# Patient Record
Sex: Female | Born: 1935 | Race: White | Hispanic: No | Marital: Married | Smoking: Never smoker
Health system: Southern US, Community
[De-identification: ages and names within clinical notes are randomized; demographics above are authoritative.]

## PROBLEM LIST (undated history)

## (undated) DIAGNOSIS — S72009A Fracture of unspecified part of neck of unspecified femur, initial encounter for closed fracture: Secondary | ICD-10-CM

## (undated) DIAGNOSIS — D649 Anemia, unspecified: Secondary | ICD-10-CM

## (undated) DIAGNOSIS — F419 Anxiety disorder, unspecified: Secondary | ICD-10-CM

## (undated) DIAGNOSIS — J449 Chronic obstructive pulmonary disease, unspecified: Secondary | ICD-10-CM

## (undated) DIAGNOSIS — K219 Gastro-esophageal reflux disease without esophagitis: Secondary | ICD-10-CM

## (undated) DIAGNOSIS — E079 Disorder of thyroid, unspecified: Secondary | ICD-10-CM

## (undated) DIAGNOSIS — I219 Acute myocardial infarction, unspecified: Secondary | ICD-10-CM

## (undated) DIAGNOSIS — I2510 Atherosclerotic heart disease of native coronary artery without angina pectoris: Secondary | ICD-10-CM

## (undated) DIAGNOSIS — N390 Urinary tract infection, site not specified: Secondary | ICD-10-CM

## (undated) DIAGNOSIS — R0989 Other specified symptoms and signs involving the circulatory and respiratory systems: Secondary | ICD-10-CM

## (undated) DIAGNOSIS — N179 Acute kidney failure, unspecified: Secondary | ICD-10-CM

## (undated) DIAGNOSIS — M1990 Unspecified osteoarthritis, unspecified site: Secondary | ICD-10-CM

## (undated) DIAGNOSIS — F329 Major depressive disorder, single episode, unspecified: Secondary | ICD-10-CM

## (undated) DIAGNOSIS — E781 Pure hyperglyceridemia: Secondary | ICD-10-CM

## (undated) DIAGNOSIS — F0390 Unspecified dementia without behavioral disturbance: Secondary | ICD-10-CM

## (undated) DIAGNOSIS — E039 Hypothyroidism, unspecified: Secondary | ICD-10-CM

## (undated) DIAGNOSIS — I10 Essential (primary) hypertension: Secondary | ICD-10-CM

## (undated) DIAGNOSIS — F32A Depression, unspecified: Secondary | ICD-10-CM

## (undated) DIAGNOSIS — R0600 Dyspnea, unspecified: Secondary | ICD-10-CM

## (undated) DIAGNOSIS — I209 Angina pectoris, unspecified: Secondary | ICD-10-CM

## (undated) HISTORY — DX: Atherosclerotic heart disease of native coronary artery without angina pectoris: I25.10

## (undated) HISTORY — PX: EYE SURGERY: SHX253

## (undated) HISTORY — DX: Disorder of thyroid, unspecified: E07.9

## (undated) HISTORY — DX: Other specified symptoms and signs involving the circulatory and respiratory systems: R09.89

---

## 1970-08-25 HISTORY — PX: ABDOMINAL HYSTERECTOMY: SHX81

## 1999-08-26 HISTORY — PX: CARDIAC SURGERY: SHX584

## 2001-03-05 ENCOUNTER — Inpatient Hospital Stay (HOSPITAL_COMMUNITY): Admission: EM | Admit: 2001-03-05 | Discharge: 2001-03-14 | Payer: Self-pay | Admitting: Emergency Medicine

## 2001-03-05 ENCOUNTER — Encounter: Admission: RE | Admit: 2001-03-05 | Discharge: 2001-03-07 | Payer: Self-pay | Admitting: *Deleted

## 2001-03-05 ENCOUNTER — Encounter: Payer: Self-pay | Admitting: Emergency Medicine

## 2001-03-13 ENCOUNTER — Encounter: Payer: Self-pay | Admitting: *Deleted

## 2001-03-19 ENCOUNTER — Emergency Department (HOSPITAL_COMMUNITY): Admission: EM | Admit: 2001-03-19 | Discharge: 2001-03-19 | Payer: Self-pay | Admitting: Emergency Medicine

## 2001-03-19 ENCOUNTER — Ambulatory Visit (HOSPITAL_COMMUNITY): Admission: RE | Admit: 2001-03-19 | Discharge: 2001-03-19 | Payer: Self-pay | Admitting: Emergency Medicine

## 2001-09-08 ENCOUNTER — Ambulatory Visit (HOSPITAL_COMMUNITY): Admission: RE | Admit: 2001-09-08 | Discharge: 2001-09-10 | Payer: Self-pay | Admitting: *Deleted

## 2002-03-18 ENCOUNTER — Ambulatory Visit (HOSPITAL_COMMUNITY): Admission: RE | Admit: 2002-03-18 | Discharge: 2002-03-19 | Payer: Self-pay | Admitting: Cardiovascular Disease

## 2002-03-18 ENCOUNTER — Encounter: Payer: Self-pay | Admitting: Cardiovascular Disease

## 2007-08-06 ENCOUNTER — Inpatient Hospital Stay (HOSPITAL_COMMUNITY): Admission: EM | Admit: 2007-08-06 | Discharge: 2007-08-14 | Payer: Self-pay | Admitting: Emergency Medicine

## 2007-08-09 ENCOUNTER — Encounter (INDEPENDENT_AMBULATORY_CARE_PROVIDER_SITE_OTHER): Payer: Self-pay | Admitting: Surgery

## 2007-08-09 ENCOUNTER — Encounter: Payer: Self-pay | Admitting: General Surgery

## 2007-08-26 HISTORY — PX: GALLBLADDER SURGERY: SHX652

## 2007-08-26 HISTORY — PX: HERNIA REPAIR: SHX51

## 2007-12-05 ENCOUNTER — Emergency Department (HOSPITAL_COMMUNITY): Admission: EM | Admit: 2007-12-05 | Discharge: 2007-12-05 | Payer: Self-pay | Admitting: Emergency Medicine

## 2010-03-07 ENCOUNTER — Observation Stay (HOSPITAL_COMMUNITY): Admission: EM | Admit: 2010-03-07 | Discharge: 2010-03-09 | Payer: Self-pay | Admitting: Emergency Medicine

## 2010-10-18 ENCOUNTER — Encounter (HOSPITAL_COMMUNITY): Payer: Self-pay | Admitting: Radiology

## 2010-10-18 ENCOUNTER — Emergency Department (HOSPITAL_COMMUNITY): Payer: Medicare Other

## 2010-10-18 ENCOUNTER — Observation Stay (HOSPITAL_COMMUNITY)
Admission: EM | Admit: 2010-10-18 | Discharge: 2010-10-19 | Disposition: A | Discharge status: Home / Self Care | Payer: Medicare Other | Attending: Internal Medicine | Admitting: Internal Medicine

## 2010-10-18 DIAGNOSIS — Z79899 Other long term (current) drug therapy: Secondary | ICD-10-CM | POA: Insufficient documentation

## 2010-10-18 DIAGNOSIS — M129 Arthropathy, unspecified: Secondary | ICD-10-CM | POA: Insufficient documentation

## 2010-10-18 DIAGNOSIS — I10 Essential (primary) hypertension: Secondary | ICD-10-CM | POA: Insufficient documentation

## 2010-10-18 DIAGNOSIS — K429 Umbilical hernia without obstruction or gangrene: Secondary | ICD-10-CM | POA: Insufficient documentation

## 2010-10-18 DIAGNOSIS — I252 Old myocardial infarction: Secondary | ICD-10-CM | POA: Insufficient documentation

## 2010-10-18 DIAGNOSIS — N390 Urinary tract infection, site not specified: Secondary | ICD-10-CM | POA: Insufficient documentation

## 2010-10-18 DIAGNOSIS — R1032 Left lower quadrant pain: Principal | ICD-10-CM | POA: Insufficient documentation

## 2010-10-18 DIAGNOSIS — I2510 Atherosclerotic heart disease of native coronary artery without angina pectoris: Secondary | ICD-10-CM | POA: Insufficient documentation

## 2010-10-18 HISTORY — DX: Essential (primary) hypertension: I10

## 2010-10-18 LAB — BASIC METABOLIC PANEL
CO2: 29 mEq/L (ref 19–32)
Calcium: 9.6 mg/dL (ref 8.4–10.5)
GFR calc Af Amer: 53 mL/min — ABNORMAL LOW (ref >60)
GFR calc non Af Amer: 44 mL/min — ABNORMAL LOW (ref >60)
Potassium: 4.2 mEq/L (ref 3.5–5.1)
Sodium: 137 mEq/L (ref 135–145)

## 2010-10-18 LAB — CBC
Hemoglobin: 11.7 g/dL — ABNORMAL LOW (ref 12.0–15.0)
MCHC: 33 g/dL (ref 30.0–36.0)
RDW: 13.2 % (ref 11.5–15.5)
WBC: 12.3 10*3/uL — ABNORMAL HIGH (ref 4.0–10.5)

## 2010-10-18 LAB — URINALYSIS, ROUTINE W REFLEX MICROSCOPIC
Nitrite: NEGATIVE
Specific Gravity, Urine: 1.015 (ref 1.005–1.030)
Urobilinogen, UA: 0.2 mg/dL (ref 0.0–1.0)
pH: 6 (ref 5.0–8.0)

## 2010-10-18 LAB — URINE MICROSCOPIC-ADD ON

## 2010-10-18 LAB — DIFFERENTIAL
Basophils Absolute: 0 10*3/uL (ref 0.0–0.1)
Basophils Relative: 0 % (ref 0–1)
Lymphocytes Relative: 19 % (ref 12–46)
Monocytes Absolute: 1.5 10*3/uL — ABNORMAL HIGH (ref 0.1–1.0)
Neutro Abs: 8 10*3/uL — ABNORMAL HIGH (ref 1.7–7.7)

## 2010-10-18 MED ORDER — IOHEXOL 300 MG/ML  SOLN
80.0000 mL | Freq: Once | INTRAMUSCULAR | Status: AC | PRN
  Administered 2010-10-18: 80 mL via INTRAVENOUS

## 2010-10-20 LAB — URINE CULTURE: Colony Count: >=100000

## 2010-10-22 NOTE — H&P (Signed)
[name] [name], [name] [name]                   ACCOUNT NO.:  [id number]  MEDICAL RECORD NO.:  [id number]           PATIENT TYPE:  E  LOCATION:  [facility]                         FACILITY:  [facility]  PHYSICIAN:  Calvert Cantor, M.D.     DATE OF BIRTH:  1918-09-30  DATE OF ADMISSION:  10/18/2010 DATE OF DISCHARGE:                             HISTORY & PHYSICAL   PRESENTING COMPLAINT:  Abdominal pain.  PRIMARY CARE PHYSICIAN:  Windle Guard, MD  CARDIOLOGIST:  Nicki Guadalajara, MD  SURGEON:  Velora Heckler, MD  HISTORY OF PRESENT ILLNESS:  This is an 77 year old female who comes in with a complaint of abdominal pain.  This was present around the middle of her abdomen where her hernia is, started about 4-5 days ago and has now settled down in the right lower quadrant.  She states the pain was quite severe at home.  She did not call her PCP - for a prescription and has been taking Tylenol which has been ineffective.  She is brought into the hospital with the pain and it is now controlled completely with 0.5 mg of IV Dilaudid.  The patient would like to be admitted.  Family states she is unable to take care of herself although her son and her daughter are both here, for some reason they cannot take care of her outside of the hospital.  A CT of her abdomen and pelvis is done and other than a large fat- containing hernia, no other abnormality has been found that could be causing her pain.  Surgery has been called and has recommended that she follow up as an outpatient.  The patient is also found to have an UTI.  She has been asymptomatic regarding this.  PAST MEDICAL HISTORY: 1. Hypertension. 2. Coronary artery disease status post stent. 3. Severe arthritis. 4. Chronic drainage from the umbilicus after a cholecystectomy and     hernia repair back in 2009.  SOCIAL HISTORY:  Does not smoke or drink or use drugs.  She lives by herself.  ALLERGIES:  CODEINE causes nausea and vomiting.  PENICILLIN causes  a rash.  MORPHINE is also not an allergy, just nausea and vomiting.  REVIEW OF SYSTEMS:  CONSTITUTIONAL:  No recent weight loss, weight gain. No frequent headaches.  No blurred vision, double vision, sore throat, sinus trouble or earache.  RESPIRATORY:  No cough or shortness of breath.  CARDIAC:  No chest pain, palpitations or pedal edema.  GI: Positive for abdominal pain, otherwise no nausea, vomiting, diarrhea or constipation.  GU:  No symptoms of dysuria or hematuria.  HEMATOLOGIC: No easy bruising.  SKIN:  No rash.  MUSCULOSKELETAL:  Has arthritis. NEUROLOGIC:  No focal numbness, weakness, tingling.  No history of stroke or seizure.  PSYCHOLOGIC:  No anxiety or depression.  PHYSICAL EXAMINATION:  VITAL SIGNS:  Blood pressure 136/53, pulse 74, respiratory rate 20, temperature 98.7, oxygen saturation 92% on room air. HEENT:  Pupils are equal, round and reactive to light.  Extraocular movements are intact.  Conjunctivae are pink.  No scleral icterus.  Oral mucosa is moist.  Oropharynx clear.  NECK:  Supple.  No thyromegaly, lymphadenopathy, or carotid bruits. HEART:  Regular rate and rhythm.  No murmurs, rubs or gallops. LUNGS:  Clear bilaterally.  Normal respiratory effort.  No use of accessory muscles. ABDOMEN:  Obese, soft, completely nontender.  She has a mild to moderate size bulge above her umbilicus which is her hernia.  She is nontender there.  Bowel sounds positive. EXTREMITIES:  No cyanosis, clubbing or edema.  Pedal pulses positive. NEUROLOGIC:  Cranial nerves II-XII intact.  Strength intact in all 4 extremities. PSYCHOLOGIC:  Awake, alert, oriented x3.  Mood and affect normal. SKIN:  Warm, dry.  No rash or bruising.  LABORATORY DATA:  Blood work; WBC count is slightly elevated at 12.3, hemoglobin is 11.7, hematocrit 35.5.  Rest of her blood work is normal. Her urine reveals large leukocyte esterase, 11-20 wbcs and few bacteria.  CT of the abdomen and pelvis reveals  periumbilical hernia containing only intraabdominal fat measuring 9 cm in diameter.  No bowel in the hernia sac.  She has degenerative disk disease in the lumbar spine and extensive calcification in the distal thoracic aorta and in the tortuous abdominal aorta.  Abdominal aorta is 3.1 cm.  Thoracic aorta is 3.4 cm.  ASSESSMENT AND PLAN: 1. Abdominal pain.  This has resolved with Dilaudid.  The patient is     very hesitant to go home.  She states Vicodin has worked well for     her pain in the past, so I will go ahead and start her on this.  I     have explained to her that she should call her PCP next time she     has significant amount of pain and therefore she would not end up     in hospital.  She is willing to follow up with her surgeon for her     hernia repair.  She apparently has an appointment with Dr. Tresa Endo on     March 8.  She recently had a stress test and needs to be cleared     for the surgery. 2. Mild urinary tract infection.  We will start her on Levaquin for 3     days. 3. Hypertension. 4. History of myocardial infarction.  The patient states that she does not want to be intubated or does want CPR DVT prophylaxis with Lovenox.  Time on admission 50 minutes.     Calvert Cantor, M.D.     SR/MEDQ  D:  10/18/2010  T:  10/18/2010  Job:  [id number]  cc:   Windle Guard, M.D. Velora Heckler, MD Nicki Guadalajara, M.D.  Electronically Signed by Calvert Cantor M.D. on 10/22/2010 08:37:22 PM

## 2010-11-06 ENCOUNTER — Other Ambulatory Visit: Payer: Self-pay | Admitting: Surgery

## 2010-11-06 ENCOUNTER — Encounter (HOSPITAL_COMMUNITY): Payer: Medicare Other | Attending: Surgery

## 2010-11-06 DIAGNOSIS — Z01812 Encounter for preprocedural laboratory examination: Secondary | ICD-10-CM | POA: Insufficient documentation

## 2010-11-06 LAB — DIFFERENTIAL
Basophils Absolute: 0 10*3/uL (ref 0.0–0.1)
Basophils Absolute: 0 10*3/uL (ref 0.0–0.1)
Basophils Relative: 0 % (ref 0–1)
Basophils Relative: 0 % (ref 0–1)
Eosinophils Absolute: 0.5 10*3/uL (ref 0.0–0.7)
Eosinophils Relative: 4 % (ref 0–5)
Lymphocytes Relative: 21 % (ref 12–46)
Lymphs Abs: 2.5 10*3/uL (ref 0.7–4.0)
Monocytes Absolute: 1.3 10*3/uL — ABNORMAL HIGH (ref 0.1–1.0)
Monocytes Absolute: 1.3 10*3/uL — ABNORMAL HIGH (ref 0.1–1.0)
Monocytes Relative: 11 % (ref 3–12)
Neutro Abs: 7.9 10*3/uL — ABNORMAL HIGH (ref 1.7–7.7)
Neutro Abs: 7.9 10*3/uL — ABNORMAL HIGH (ref 1.7–7.7)
Neutrophils Relative %: 64 % (ref 43–77)
Neutrophils Relative %: 64 % (ref 43–77)

## 2010-11-06 LAB — URINALYSIS, ROUTINE W REFLEX MICROSCOPIC
Bilirubin Urine: NEGATIVE
Glucose, UA: NEGATIVE mg/dL
Hgb urine dipstick: NEGATIVE
Ketones, ur: NEGATIVE mg/dL
Nitrite: NEGATIVE
Protein, ur: NEGATIVE mg/dL
Specific Gravity, Urine: 1.018 (ref 1.005–1.030)
Urobilinogen, UA: 0.2 mg/dL (ref 0.0–1.0)
pH: 5 (ref 5.0–8.0)

## 2010-11-06 LAB — BASIC METABOLIC PANEL
BUN: 19 mg/dL (ref 6–23)
Creatinine, Ser: 1.26 mg/dL — ABNORMAL HIGH (ref 0.4–1.2)
GFR calc non Af Amer: 40 mL/min — ABNORMAL LOW (ref >60)
Potassium: 4.5 mEq/L (ref 3.5–5.1)

## 2010-11-06 LAB — PROTIME-INR
INR: 1.22 (ref 0.00–1.49)
Prothrombin Time: 15.6 seconds — ABNORMAL HIGH (ref 11.6–15.2)

## 2010-11-06 LAB — CBC
Hemoglobin: 11.5 g/dL — ABNORMAL LOW (ref 12.0–15.0)
MCHC: 31.1 g/dL (ref 30.0–36.0)
RBC: 3.81 MIL/uL — ABNORMAL LOW (ref 3.87–5.11)
WBC: 12.3 10*3/uL — ABNORMAL HIGH (ref 4.0–10.5)

## 2010-11-06 LAB — SURGICAL PCR SCREEN: Staphylococcus aureus: NEGATIVE

## 2010-11-09 LAB — CBC
Hemoglobin: 10.2 g/dL — ABNORMAL LOW (ref 12.0–15.0)
Hemoglobin: 10.6 g/dL — ABNORMAL LOW (ref 12.0–15.0)
MCH: 32.7 pg (ref 26.0–34.0)
MCH: 32.8 pg (ref 26.0–34.0)
MCV: 96.8 fL (ref 78.0–100.0)
RBC: 3.1 MIL/uL — ABNORMAL LOW (ref 3.87–5.11)
RBC: 3.24 MIL/uL — ABNORMAL LOW (ref 3.87–5.11)
WBC: 9.2 10*3/uL (ref 4.0–10.5)

## 2010-11-09 LAB — BASIC METABOLIC PANEL
CO2: 26 mEq/L (ref 19–32)
CO2: 31 mEq/L (ref 19–32)
Calcium: 8.4 mg/dL (ref 8.4–10.5)
Calcium: 8.7 mg/dL (ref 8.4–10.5)
Chloride: 101 mEq/L (ref 96–112)
GFR calc Af Amer: 35 mL/min — ABNORMAL LOW (ref >60)
GFR calc Af Amer: 50 mL/min — ABNORMAL LOW (ref >60)
Sodium: 137 mEq/L (ref 135–145)
Sodium: 138 mEq/L (ref 135–145)

## 2010-11-09 LAB — CK TOTAL AND CKMB (NOT AT ARMC)
CK, MB: 3.5 ng/mL (ref 0.3–4.0)
Relative Index: 0.3 (ref 0.0–2.5)
Relative Index: 0.5 (ref 0.0–2.5)
Total CK: 721 U/L — ABNORMAL HIGH (ref 7–177)
Total CK: 896 U/L — ABNORMAL HIGH (ref 7–177)

## 2010-11-09 LAB — URINALYSIS, MICROSCOPIC ONLY
Bilirubin Urine: NEGATIVE
Ketones, ur: NEGATIVE mg/dL
Protein, ur: NEGATIVE mg/dL
Urobilinogen, UA: 0.2 mg/dL (ref 0.0–1.0)

## 2010-11-10 LAB — CULTURE, BLOOD (ROUTINE X 2)
Culture: NO GROWTH
Culture: NO GROWTH

## 2010-11-10 LAB — CK TOTAL AND CKMB (NOT AT ARMC)
CK, MB: 4 ng/mL (ref 0.3–4.0)
Relative Index: 0.5 (ref 0.0–2.5)
Total CK: 857 U/L — ABNORMAL HIGH (ref 7–177)

## 2010-11-10 LAB — DIFFERENTIAL
Basophils Absolute: 0 10*3/uL (ref 0.0–0.1)
Basophils Relative: 0 % (ref 0–1)
Eosinophils Absolute: 0.2 10*3/uL (ref 0.0–0.7)
Eosinophils Relative: 1 % (ref 0–5)
Neutrophils Relative %: 82 % — ABNORMAL HIGH (ref 43–77)

## 2010-11-10 LAB — CBC
MCV: 95.8 fL (ref 78.0–100.0)
Platelets: 259 10*3/uL (ref 150–400)
RBC: 3.9 MIL/uL (ref 3.87–5.11)
RDW: 12.8 % (ref 11.5–15.5)
WBC: 13.8 10*3/uL — ABNORMAL HIGH (ref 4.0–10.5)

## 2010-11-10 LAB — PROCALCITONIN: Procalcitonin: <0.5 ng/mL

## 2010-11-10 LAB — POCT I-STAT, CHEM 8
HCT: 42 % (ref 36.0–46.0)
Hemoglobin: 14.3 g/dL (ref 12.0–15.0)
Potassium: 4.3 mEq/L (ref 3.5–5.1)
Sodium: 136 mEq/L (ref 135–145)
TCO2: 26 mmol/L (ref 0–100)

## 2010-11-10 LAB — LACTIC ACID, PLASMA: Lactic Acid, Venous: 1.7 mmol/L (ref 0.5–2.2)

## 2010-11-12 ENCOUNTER — Other Ambulatory Visit: Payer: Self-pay | Admitting: Surgery

## 2010-11-12 ENCOUNTER — Observation Stay (HOSPITAL_COMMUNITY)
Admission: RE | Admit: 2010-11-12 | Discharge: 2010-11-13 | Disposition: A | Discharge status: Home / Self Care | Payer: Medicare Other | Source: Ambulatory Visit | Attending: Surgery | Admitting: Surgery

## 2010-11-12 DIAGNOSIS — Y838 Other surgical procedures as the cause of abnormal reaction of the patient, or of later complication, without mention of misadventure at the time of the procedure: Secondary | ICD-10-CM | POA: Insufficient documentation

## 2010-11-12 DIAGNOSIS — Z01812 Encounter for preprocedural laboratory examination: Secondary | ICD-10-CM | POA: Insufficient documentation

## 2010-11-12 DIAGNOSIS — I252 Old myocardial infarction: Secondary | ICD-10-CM | POA: Insufficient documentation

## 2010-11-12 DIAGNOSIS — IMO0002 Reserved for concepts with insufficient information to code with codable children: Secondary | ICD-10-CM | POA: Insufficient documentation

## 2010-11-12 DIAGNOSIS — Z79899 Other long term (current) drug therapy: Secondary | ICD-10-CM | POA: Insufficient documentation

## 2010-11-12 DIAGNOSIS — I10 Essential (primary) hypertension: Secondary | ICD-10-CM | POA: Insufficient documentation

## 2010-11-12 DIAGNOSIS — Z01811 Encounter for preprocedural respiratory examination: Secondary | ICD-10-CM | POA: Insufficient documentation

## 2010-11-12 DIAGNOSIS — I2510 Atherosclerotic heart disease of native coronary artery without angina pectoris: Secondary | ICD-10-CM | POA: Insufficient documentation

## 2010-11-12 DIAGNOSIS — K420 Umbilical hernia with obstruction, without gangrene: Principal | ICD-10-CM | POA: Insufficient documentation

## 2010-11-12 LAB — PROTIME-INR
INR: 1.2 (ref 0.00–1.49)
Prothrombin Time: 15.4 seconds — ABNORMAL HIGH (ref 11.6–15.2)

## 2010-11-15 LAB — CULTURE, ROUTINE-ABSCESS: Culture: NO GROWTH

## 2010-11-15 NOTE — Op Note (Signed)
[name] [name], [name] [name]                   ACCOUNT NO.:  [id number]  MEDICAL RECORD NO.:  [id number]           PATIENT TYPE:  O  LOCATION:  1315                         FACILITY:  [facility]  PHYSICIAN:  Velora Heckler, MD      DATE OF BIRTH:  06-20-1919  DATE OF PROCEDURE:  11/12/2010                               OPERATIVE REPORT   PREOPERATIVE DIAGNOSES: 1. Recurrent umbilical hernia. 2. Chronic suture abscesses.  POSTOPERATIVE DIAGNOSES: 1. Recurrent umbilical hernia. 2. Chronic suture abscesses.  PROCEDURE: 1. Primary repair, recurrent umbilical hernia. 2. Resection of hernia sac and previous suture material.  SURGEON:  Velora Heckler, MD, FACS  ANESTHESIA:  General Per Dr. Eilene Ghazi.  ESTIMATED BLOOD LOSS:  Minimal.  PREPARATION:  ChloraPrep.  COMPLICATIONS:  None.  INDICATIONS:  The patient is an 77 year old white female from [place], [place].  The patient had undergone laparoscopic cholecystectomy with concurrent umbilical hernia repair with Ethibond sutures in 2008.  In September 2011, the patient developed discomfort at the umbilicus and drainage.  She then developed a recurrent hernia.  She presented in February 2012 to my office for evaluation.  She now comes to the operating room for repair.  BODY OF REPORT:  Procedure was done in OR #2 at the [facility].  The patient was brought to the operating room, placed in supine position on the operating room table.  Following administration of general anesthesia, the patient was prepped and draped in the usual strict aseptic fashion.  After ascertaining that an adequate level of anesthesia been achieved, the patient's previous transverse incision beneath the umbilicus was reopened with a #10 blade. Dissection was carried through subcutaneous tissues.  Incision was lengthened on either side.  Dissection was carried down to the fascia. A relatively large hernia sac is identified.  It is dissected  out.  At the base of the umbilicus on the hernia sac, there were Ethibond sutures which appeared to have chronic granulation tissue and a thick gray fluid material.  Aerobic and anaerobic cultures of this material were obtained.  The material was then debrided off the posterior aspect of the umbilicus.  The hernia sac was then excised at the level of the fascia circumferentially.  There was incarcerated omentum which was released and reduced back within the peritoneal cavity.  Hernia sac and cultures were submitted to the Laboratory.  The fascial defect was then closed transversely with interrupted #1 Novafil sutures.  There was some tension on the suture line, but this was not too significant.  The fascia appears to be healthy.  There does not appear to be any residual infection.  The subcutaneous tissues were then irrigated copiously with warm saline.  Subcutaneous tissues were reapproximated with interrupted 2-0 Vicryl sutures.  Umbilicus was refixed to the abdominal wall with the 2-0 Vicryl suture.  Skin was closed with stainless steel staples.  Local field block was placed with Marcaine.  Wound was washed and dried and sterile dressings were applied.  The patient was awakened from anesthesia and brought to the recovery room.  The patient tolerated the  procedure well.   Velora Heckler, MD, FACS     TMG/MEDQ  D:  11/12/2010  T:  11/12/2010  Job:  [id number]  cc:   Windle Guard, M.D. Fax: [phone]  Nicki Guadalajara, M.D. Fax: [phone]  Electronically Signed by Darnell Level MD on 11/15/2010 09:25:34 AM

## 2010-11-15 NOTE — Discharge Summary (Signed)
  [name], [name]                   ACCOUNT NO.:  [id number]  MEDICAL RECORD NO.:  [id number]           PATIENT TYPE:  O  LOCATION:  1315                         FACILITY:  [facility]  PHYSICIAN:  Velora Heckler, MD      DATE OF BIRTH:  01/30/1919  DATE OF ADMISSION:  11/12/2010 DATE OF DISCHARGE:                              DISCHARGE SUMMARY   REASON FOR ADMISSION:  Recurrent umbilical hernia with drainage.  BRIEF HISTORY:  The patient is an 77 year old white female from [place], [place].  She had developed a hernia at the umbilicus which was repaired in 2008 with Ethibond sutures.  In September 2011, she developed drainage from the umbilicus.  She was diagnosed with a chronic suture abscess.  She also developed recurrent hernia.  She now comes to the operating room for excision of previous suture material and repair of recurrent umbilical hernia.  HOSPITAL COURSE:  The patient was admitted on March 20 and taken directly to the operating room.  She underwent exploration at the umbilicus.  She had removal of previous suture material and primary repair of recurrent umbilical hernia.  Postoperative course was uneventful.  She had good pain control.  She tolerated a regular diet. She is prepared for discharge home on the first postoperative day.  DISCHARGE PLAN:  The patient is discharged home today, November 13, 2010 in good condition, tolerating a regular diet, and ambulating independently to the bathroom.  Discharge medications include Vicodin as needed for pain and all of her home medications as per usual.  The patient will be seen back in my office at [facility] in 1 week for wound check.  We will remove her staples at that time. Umbilical wound will be cleansed with a Q-tip and hydrogen peroxide twice daily and dressed with a small amount of triple antibiotic ointment.  Family will assist in wound care.  FINAL DIAGNOSIS:  Recurrent umbilical hernia, chronic  suture abscesses.  Condition on discharge is good.     Velora Heckler, MD     TMG/MEDQ  D:  11/13/2010  T:  11/13/2010  Job:  [id number]  cc:   Windle Guard, M.D. Fax: [phone]  Nicki Guadalajara, M.D. Fax: [phone]  Electronically Signed by Darnell Level MD on 11/15/2010 09:25:53 AM

## 2010-11-17 LAB — ANAEROBIC CULTURE

## 2010-11-22 NOTE — Discharge Summary (Signed)
NAMECIARA, [name] [name] NO.:  [id number]  MEDICAL RECORD NO.:  [id number]           PATIENT TYPE:  I  LOCATION:  5530                         FACILITY:  [facility]  PHYSICIAN:  Zannie Cove, MD     DATE OF BIRTH:  1919-07-19  DATE OF ADMISSION:  10/18/2010 DATE OF DISCHARGE:  10/19/2010                              DISCHARGE SUMMARY   PRIMARY CARE PHYSICIAN:  Windle Guard, MD  CARDIOLOGIST:  Nicki Guadalajara, MD  GENERAL SURGEON:  Velora Heckler, MD  DISCHARGE DIAGNOSES: 1. Large periumbilical hernia containing fat only. 2. Abdominal pain secondary to above. 3. History of coronary artery disease status post percutaneous     coronary intervention. 4. History of osteoarthritis. 5. History of chronic drainage from umbilicus after cholecystectomy     and hernia repair back in 2009. 6. Urinary tract infection. 7. Hypertension.  DISCHARGE MEDICATIONS:  Are as follows; 1. Vicodin 5/325 1-2 tablets q.6 h. p.r.n. 2. Levofloxacin 250 mg p.o. daily for 3 days. 3. Senokot 2 tablets p.o. daily p.r.n. 4. Aspirin 81 mg daily. 5. Cipro 500 mg p.o. b.i.d. to start 7 days before surgery. 6. Crestor 10 mg p.o. daily. 7. Fish oil 1400 mg capsule daily. 8. Furosemide 20 mg p.o. daily. 9. Metoprolol tartrate 25 mg 1 tablet in the morning and half tablet     in the evening. 10.Plavix 75 mg daily. 11.Tylenol 650 mg q.6 h. p.r.n.  DIAGNOSTIC INVESTIGATIONS: 1. KUB 24, she has grunting in the right costophrenic angle, possibly     represents a small right pleural effusion, chronic pleural     thickening more likely, otherwise benign-appearing abdomen and     chest. 2. CT of the abdomen and pelvis on 24th, a benign appearing abdomen,     periumbilical hernia containing only abdominal fat.  HOSPITAL COURSE: 1. Ms. Roat is an 77 year old female with known periumbilical hernia     who was supposed to have hernia repair after cardiac clearance     presented to the hospital with  abdominal pain.  On evaluation, she     is not felt to have a strangulated hernia.  She did have a CT     abdomen and pelvis and documented the same, which only showed     periumbilical fat in the hernia sac.  Her case was discussed with     General Surgery on-call who said that she should follow up with her     surgeon for her hernia repair as originally planned.  The patient     has appointment with Dr. Tresa Endo for cardiac clearance prior to that     on 8th.  However, she was admitted for pain control, started on     clear liquid diet, which was advanced to mechanical soft diet by     the next day, which she has been tolerating well.  Her pain was     well controlled with Vicodin. 2. Urinary infection.  She was started on Levaquin for 3 days.  Final     urine culture results showed more than 100,000 polymicrobial  flora. 3. History of chronic medical problems, remained stable.  The patient     will follow up with Dr. Tresa Endo for her cardiac clearance and then     with Dr. Gerrit Friends for a scheduled hernia repair.     Zannie Cove, MDPJ/MEDQ  D:  11/22/2010  T:  11/22/2010  Job:  [id number]  cc:   Windle Guard, M.D.  Electronically Signed by Zannie Cove  on 11/22/2010 05:53:56 PM

## 2010-12-25 ENCOUNTER — Encounter (INDEPENDENT_AMBULATORY_CARE_PROVIDER_SITE_OTHER): Payer: Self-pay | Admitting: Surgery

## 2011-01-06 ENCOUNTER — Encounter (INDEPENDENT_AMBULATORY_CARE_PROVIDER_SITE_OTHER): Payer: Self-pay | Admitting: Surgery

## 2011-01-07 NOTE — H&P (Signed)
NAME:  [name] [name], [name] [name]                   ACCOUNT NO.:  [id number]   MEDICAL RECORD NO.:  [id number]          PATIENT TYPE:  INP   LOCATION:  5712                         FACILITY:  [facility]   PHYSICIAN:  Revonda Standard L. Rennis Harding, N.P. DATE OF BIRTH:  09-21-1918   DATE OF ADMISSION:  08/06/2007  DATE OF DISCHARGE:                              HISTORY & PHYSICAL   CARDIOLOGIST:  Darlin Priestly, MD   CHIEF COMPLAINT:  Abdominal pain.   HISTORY OF PRESENT ILLNESS:  [name] [name] is a 77 year old female patient,  history of stable coronary artery disease, history of MI in 2003, status  post coronary stent. She also has known cholelithiasis which has been  previously evaluated by Dr. Lindie Spruce, and surgery was planned for August 27, 2007.  The patient was initially diagnosed in July after an episode  of biliary colic, has had at least one more significant episode prior to  that, and in between this had minor episodes of nocturnal abdominal  discomfort and nausea.  Over the past 1 week, the patient has noticed  increasing abdominal symptoms, nausea, inability to tolerate food.  She  has self-limited her diet to Jello and bland foods, poor p.o. intake of  solids.  She has been exacerbated by more and more nocturnal episodes of  nausea and discomfort, and she apparently had low-grade fever of 100  this past Sunday.  Last night, the patient had a severe episode around  4:00 in the morning of epigastric and right upper quadrant pain that was  band-like and radiated bilaterally around from the mid section towards  the back, worse in the right side.  She presented to the ER.  Pain was  improved after administration of IV narcotics, but still present.  She  had mild leukocytosis of 11,800.  Transaminitis with elevated alkaline  phosphatase, ALT and AST, mild elevation in total bilirubin.  Ultrasound  was obtained and did demonstrate a 2-cm stone impacted in the neck of  the gallbladder, with the common duct being  dilated at 1 cm.  The  patient's last dose of Plavix was yesterday.  The patient has been  evaluated for admission by the surgery team.   REVIEW OF SYSTEMS:  As above.   PAST MEDICAL HISTORY:  1. Glaucoma.  2. Hypertension.  3. Prior MI and stent in 2003.  4. Dyslipidemia.  5. Osteoarthritis.  No known on deficiency anemia.   PAST SURGICAL HISTORY:  Remote history of vaginal hysterectomy in 1972.   FAMILY HISTORY:  Noncontributory.   SOCIAL HISTORY:  She is married.  She does not utilize tobacco or  alcohol products or illegal drugs.  She has not driven since 2003.  Her  husband is also elderly.  He is 2 and has similar medical problems.   DRUG ALLERGIES:  CODEINE WHICH CAUSES PROFOUND NAUSEA AND VOMITING.  PCN  WHICH CAUSES HIVES.   CURRENT MEDICATIONS:  Include aspirin, Diovan, Lopressor, Plavix and  Crestor.  Crestor is a new start for this patient in the past several  weeks.  No dosages of drugs available  at this time.   PHYSICAL EXAMINATION:  GENERAL:  Pleasant, elderly female patient  complaining of improved right upper quadrant abdominal pain since,  arrival to the ER.  VITAL SIGNS:  Temp 99.3, BP 122/55, pulse 68 and  regular, respirations 20.  NEURO:  Patient is alert when x3, moving extremities x4.  No focal  deficits.  HEENT:  Head normocephalic, sclerae noninjected.  NECK:  Supple.  No adenopathy.  CHEST:  Bilateral lung sounds, clear to auscultation.  Respiratory  effort is nonlabored.  She is satting 95% on room air.  CARDIAC:  S1-S2, no obvious rubs, murmurs, thrills or gallops and JVD.  Pulse is regular.  ABDOMEN:  Soft.  Bowel sounds are present.  She is somewhat distended  and obese.  She is tender in the right upper quadrant more laterally,  but this is consistent with a Murphy sign, minimal guarding.  No  rebounding.  She has an umbilical hernia which stays protruded, is  nontender.  EXTREMITIES:  Symmetrical in appearance without edema, cyanosis or   clubbing.   LABS:  White count 11,800, neutrophils 80%, hemoglobin 11, platelets  349,000, sodium 35, potassium 3.9, CO2 30, BUN 21, creatinine 1.2,  glucose 146, total bilirubin 1.5, alkaline phosphatase 326, AST 206, ALT  133, lipase is normal.  The ultrasound again demonstrates an impacted  gallbladder neck stone measuring 2 cm, and the common duct is dilated to  1 cm.   IMPRESSION:  1. Biliary colic secondary to impacted gallbladder neck stone.  2. Leukocytosis secondary to problem number one.  3. Stable asymptomatic CAD.  4. Hypertension.  5. Dyslipidemia.  6. Prior history of anemia and osteoarthritis.   PLAN:  1. Admit the patient to 5700.  2. Empiric Avelox to cover gallbladder pathogens, n.p.o. status      initially.  3. IV fluid hydration.  4. IV pain meds and nausea antiemetics.  5. Repeat BMET, CBC, amylase and lipase in the morning.  6. Hold Plavix, otherwise will probably resume normal home medicines      in the morning, if she can tolerate clear liquids.  7. The patient has previously been cleared by Dr. Jenne Campus for upcoming      surgery in January.  We are waiting      records from the office.  More than likely, we will proceed with      laparoscopic cholecystectomy on Monday or Tuesday, and if patient      condition worsens, i.e. increase in white count and pain, will      probably go ahead and repair the umbilical hernia at the same time.      Allison L. Rennis Harding, N.P.     ALE/MEDQ  D:  08/06/2007  T:  08/07/2007  Job:  [id number]   cc:   Dr. Angelina Ok, MD

## 2011-01-07 NOTE — Op Note (Signed)
[name] [name], MANGAL                   ACCOUNT NO.:  [id number]   MEDICAL RECORD NO.:  [id number]          PATIENT TYPE:  INP   LOCATION:  5712                         FACILITY:  [facility]   PHYSICIAN:  Velora Heckler, MD      DATE OF BIRTH:  04-20-1919   DATE OF PROCEDURE:  08/09/2007  DATE OF DISCHARGE:                               OPERATIVE REPORT   PREOPERATIVE DIAGNOSIS:  Symptomatic cholelithiasis.   POSTOPERATIVE DIAGNOSES:  1. Chronic cholecystitis.  2. Cholelithiasis.  3. Choledocholithiasis.  4. Incarcerated umbilical hernia.   PROCEDURES:  1. Laparoscopic cholecystectomy, with intraoperative cholangiography.  2. Repair of incarcerated umbilical hernia.   SURGEON:  Velora Heckler,  M.D., FACS   ANESTHESIA:  General.   ESTIMATED BLOOD LOSS:  Minimal.   PREPARATION:  Betadine.   COMPLICATIONS:  None.   INDICATIONS:  The patient is an 77 year old white female with known  cholelithiasis.  She was scheduled for elective laparoscopic  cholecystectomy in January 2008 with Dr. Jimmye Norman.  The patient was  admitted on the general surgery service at [facility] last week with  abdominal pain.  Workup revealed abnormal liver function test.  Plavix  was discontinued, and the patient was prepared for the operating room.   BODY OF REPORT:  Procedure is done in O.R. #17 at the [facility].  The patient was brought to the operating room and  placed in a supine position on the operating room table.  Following  administration of general anesthesia, the patient is positioned and then  prepped and draped in the usual strict aseptic fashion.  After  ascertaining that an adequate level of anesthesia had been obtained, an  infraumbilical incision is made with a #15 blade.  Dissection is carried  through subcutaneous tissues.  A large incarcerated umbilical hernia sac  is dissected out of the subcutaneous tissues and away from the  umbilicus.  It is dissected down to  the fascia and inverted back within  the peritoneal cavity.  Fascial edges are freshened with the  electrocautery.  Peritoneum is incised, and the peritoneal cavity is  entered.  0 Vicryl pursestring suture is placed in the fascia.  A Hasson  cannula is introduced and secured with the pursestring suture.  Abdomen  is insufflated carbon dioxide.  The laparoscope is introduced and the  abdomen explored.  Operative ports are placed along the right costal  margin in the midline, midclavicular line, and anterior axillary line.  Fundus of the gallbladder is partially adherent to omentum.  Omentum is  gently mobilized and hemostasis obtained with the electrocautery.  Gallbladder is tense and distended.  It is punctured with an aspirating  trocar, and green cloudy fluid is evacuated from the gallbladder.  Gallbladder is then grasped and retracted cephalad.  Omental adhesions  are taken down.  Duodenum is somewhat adherent to the undersurface of  the gallbladder.  With gentle blunt dissection, the gallbladder is  exposed.  Dissection is carried down to the neck of the gallbladder.  Peritoneum is incised.  With some  difficulty, the neck of the  gallbladder is dissected out.  The cystic duct is identified.  A small  lymph node is excised from the area of the junction of the cystic duct  and wall of the gallbladder.  Cystic duct is encircled.  A clip is  placed across the neck of the gallbladder.  Cystic duct is incised.  Clear mucoid bile emanates from the cystic duct.  A Cook cholangiography  catheter was introduced through a stab wound in the right upper  quadrant, inserted into the cystic duct, and secured with a Ligaclip.  Using C-arm fluoroscopy, real-time cholangiography is performed.  A  successful cholangiogram is obtained.  There is dilatation of the entire  biliary tree.  There appear to be filling defects distally in the common  bile duct.  There is reflux of contrast into the right and  left hepatic  ductal systems.  There is no extravasation.  Clip is withdrawn, and Cook  catheter is removed from the peritoneal cavity.  Cystic duct is triply  clipped and divided.  The cystic artery is identified, doubly clipped,  and divided.  Gallbladder is excised with some difficulty from the  gallbladder bed using the hook electrocautery for hemostasis.  Gallbladder is inadvertently opened at several points.  A large  gallstone as well as some smaller gallstones are identified.  These are  retrieved and placed into the EndoCatch bag along with the gallbladder  and removed from the peritoneal cavity.  They are submitted to  pathology.  Gallbladder bed is irrigated with warm saline and hemostasis  obtained with the electrocautery.  A 19-French Blake drain is brought in  from the right lateral stab wound and placed beneath the liver.  It is  secured to the skin with a 3-0 nylon suture and placed to bulb suction.  Right upper quadrant is copiously irrigated with saline which is  evacuated.   Pneumoperitoneum is released, and all ports are removed.  Good  hemostasis is noted at all port sites.  At the umbilicus, the  pursestring suture is removed.  The umbilical hernia defect is then  closed with interrupted #1 Ethibond sutures.  Subcutaneous tissues are  irrigated.  Wounds are anesthetized with local anesthetic.  Skin  incisions are closed with interrupted 4-0 Vicryl subcuticular sutures.  Wounds are washed and dried, and Benzoin and Steri-Strips are applied.  Sterile dressings were applied.  The patient is awakened from anesthesia  and brought to the recovery room in stable condition.  The patient  tolerated the procedure well.      Velora Heckler, MD  Electronically Signed     TMG/MEDQ  D:  08/09/2007  T:  08/10/2007  Job:  [id number]   cc:   Windle Guard, M.D.  Cherylynn Ridges, M.D.

## 2011-01-07 NOTE — Op Note (Signed)
NAMELORALYE, LOBERG                   ACCOUNT NO.:  [id number]   MEDICAL RECORD NO.:  [id number]          PATIENT TYPE:  INP   LOCATION:  5156                         FACILITY:  [facility]   PHYSICIAN:  Petra Kuba, M.D.    DATE OF BIRTH:  [birth year]   DATE OF PROCEDURE:  08/10/2007  DATE OF DISCHARGE:                               OPERATIVE REPORT   PROCEDURE:  1. Endoscopic retrograde cholangiopancreatogram,  2. Sphincterotomy.  3. Stone extraction.   INDICATIONS:  CBD stone on intraoperative cholangiogram.  Consent was  signed after risks, benefits, methods and options were thoroughly  discussed by my PA yesterday, by Dr. Gerrit Friends and by myself today.   MEDICINES USED:  Fentanyl 50 mcg, Versed 3 mg.   PROCEDURE:  The side-viewing therapeutic video duodenoscope was inserted  by indirect vision into the stomach--some fluid was suctioned--and  advanced through a deformed pylorus.  It seemed like the pylorus was  widely patent, but she had scarring from her previous ulcer disease.  A  normal-appearing ampulla was brought into view.  Using the triple-lumen  sphincterotome using the JAG wire, deep selective cannulation was  obtained on the first attempt.  There were no PD injections throughout  the procedure.  The CBD was slightly dilated; an obvious stone was seen  in the distal CBD.  We did not overfill the intrahepatics.  The cystic  duct remnant was confirmed.  We went ahead and proceeded with a medium-  sized sphincterotomy until we could get the Foley bulb sphincterotome  easily and out of the duct and there was adequate biliary drainage.  We  then exchanged the sphincterotome for the adjustable 12- to 15-mm  balloon and proceeded with balloon pull-throughs in the customary  fashion.  On the first 2 balloon pull-throughs, multiple stones and  debris were removed; on the third one, no obvious further stones or  sludge or debris were removed.  There was minimal resistance in  withdrawing the balloon.  Unfortunately, with each balloon pull-through,  the wire fell out of the duct and we did have to recannulate using the  balloon catheter and the wire.  We then went ahead and proceeded with an  occlusion cholangiogram in the customary fashion, which did not reveal  any residual stones in the CBD.  The intrahepatic were not overfilled.  Once this balloon was pulled through one more time and there was no  further debris or sludge, we elected to stop the procedure at this  junction.  There was slightly sluggish, but adequate biliary drainage.  The scope was removed.  The patient tolerated the procedure well.  There  was no obvious immediate complication.   ENDOSCOPIC DIAGNOSES:  1. Deformed pylorus questionably from previous ulcer disease.  2. Normal ampulla.  3. Obvious common bile duct stone.  4. No PD injections throughout the procedure.  5. Moderate sphincterotomy and three 12- to 15-mm adjustable balloon      pull-throughs with 2 obvious stones and sludge being removed.  6. Negative occlusion cholangiogram at the end of the procedure.  7.  Upper ducts not overfilled.   PLAN:  Observe for delayed complications, if none, hopefully slowly  advance diet.  Further care per surgery team.  Happy to see back p.r.n.           ______________________________  Petra Kuba, M.D.     MEM/MEDQ  D:  08/10/2007  T:  08/11/2007  Job:  [id number]   cc:   Darlin Priestly, MD  Velora Heckler, MD

## 2011-01-07 NOTE — Consult Note (Signed)
NAMEALEE, GRESSMAN                   ACCOUNT NO.:  [id number]   MEDICAL RECORD NO.:  [id number]          PATIENT TYPE:  INP   LOCATION:  5156                         FACILITY:  [facility]   PHYSICIAN:  Petra Kuba, M.D.    DATE OF BIRTH:  1919/01/17   DATE OF CONSULTATION:  DATE OF DISCHARGE:                                 CONSULTATION   We were asked to see Ms. Puckett in consultation by [facility]  Surgery for common bile duct stone on August 09, 2007.   HISTORY OF PRESENT ILLNESS:  This is an 77 year old female with known  cholelithiasis that became symptomatic last Thursday night. She  experienced severe nausea, vomiting and abdominal pain and was admitted.  She was found to have an incarcerated abdominal umbilical hernia as well  as a symptomatic gallbladder. She had both her umbilical hernia repair  as well as a laparoscopic cholecystectomy done today August 09, 2007.  On intraoperative cholangiogram, she was found to have  common bile duct  stones.   PAST MEDICAL HISTORY:  Positive for glaucoma, coronary artery disease.  She has had an MI with stent placement, hypertension, iron deficiency  anemia, dyslipidemia, osteoarthritis.   PAST SURGICAL HISTORY:  Vaginal hysterectomy.   ALLERGIES:  She has an allergy to CODEINE and PENICILLIN.   MEDICATIONS:  Plavix, Diovan, Lopressor, Crestor, Cosopt and 81 mg  aspirin.   SOCIAL HISTORY:  Negative for drugs, alcohol and tobacco.   PHYSICAL EXAM:  GENERAL:  She is alert and oriented, pale, appears weak.  VITAL SIGNS:  Her temperature is 97.9, respirations 15, pulse is 60,  blood pressure is 159/64.  HEART:  Regular rate and rhythm with no appreciable murmurs, rubs or  gallops.  ABDOMEN:  The bandages are clean and dry.  She has the expected  tenderness after surgery. Her abdomen is quiet.  No flatus yet per  patient.   LABORATORY DATA:  Hemoglobin 10.8, hematocrit 32.7, white count 8.4,  platelets 351,000. Her BMET is  within normal limits.  PT is 14, PTT is  42.  LFTs are normal.   ASSESSMENT:  This is an 77 year old female with coronary artery disease  who is immediately status post incarcerated umbilical hernia repair and  laparoscopic cholecystectomy. Her intraoperative cholangiogram is  positive for common bile duct stones.   PLAN:  Please continue Avelox, hold Lovenox, repeat labs in a.m. We have  scheduled the patient for an ERCP at 3:30 p.m. on December 16 with Dr.  Vida Rigger. The procedures and risks were explained to both the  patient's daughters as well as the patient and they agreed to move  forward with the procedure. Thanks very much for this consultation.      Stephani Police, PA    ______________________________  Petra Kuba, M.D.    MLY/MEDQ  D:  08/12/2007  T:  08/12/2007  Job:  [id number]   cc:   Velora Heckler, MD

## 2011-01-10 NOTE — Cardiovascular Report (Signed)
NAME:  [name] [name], [name] [name]                             ACCOUNT NO.:  [id number]   MEDICAL RECORD NO.:  [id number]                   PATIENT TYPE:  OIB   LOCATION:  6524                                 FACILITY:  [facility]   PHYSICIAN:  Darlin Priestly, M.D.             DATE OF BIRTH:  April 17, 1919   DATE OF PROCEDURE:  03/18/2002  DATE OF DISCHARGE:  03/19/2002                              CARDIAC CATHETERIZATION   PROCEDURE:  1. Left heart catheterization  2. Coronary angiography  3. Left ventriculogram  4. Left anterior descending/proximal--failed percutaneous intervention.   COMPLICATIONS:  None.   INDICATIONS FOR PROCEDURE:  This 77 year old female with a history of  hypertension, acute inferolateral MI in July of 2002 with AngioJet PTCA and  stenting of the first OM using a 2.75 x 22 mm BX Velocity. Repeat  catheterization in January of 2003 revealed a 70%  InStent restenosis of her  OM as well as LAD disease. She underwent cutting balloon angioplasty of the  OM with a cutting balloon angioplasty of the LAD using a 2.25 x 10 mm  balloon. She recently underwent stress test revealing ischemia in her LAD  territory. She is now brought for repeat catheterization.   DESCRIPTION OF PROCEDURE:  After obtaining informed consent, the patient was  brought to the Cardiac Catheterization Lab where the left groin was shaved,  prepped and draped in the usual sterile fashion.  ECT monitoring was  established. Using modified Seldinger technique, a #6 French arterial sheath  was inserted into her right femoral artery. A 6 French diagnostic catheter  was used to performed diagnostic angiography. This revealed a medium-sized  left vein with no significant disease. The LAD is a medium-sized vessel  which has a 40% mid vessel stenosis and then totally occluded in its mid  segment. There is right to left collaterals to the mid and distal LAD via  the PDA and posterolateral branch. There are two small  diagonal branches  with no significant disease.   The left circumflex is a large vessel coursing __________  obtuse marginal  branch. __________ circumflex has no significant disease. The first OM is a  large vessel with a stent in its mid portion which appears widely patent  with no significant stenosis.   The right coronary artery is a large vessel dominant __________ PDA as well  as posterolateral branch. There is mild 40% proximal narrowing. The PDA and  posterolateral branch have no significant disease. There is good right to  left collaterals to the distal LAD.   Left ventriculogram reveals mildly depressed EF at 50% with mild  anterolateral hypokinesis.   HEMODYNAMICS:  Arterial pressure of 167/76,  LV systemic pressure of 167/12,  LVEDP of 19.   INTERVENTIONAL PROCEDURE:  LAD - mid:  Following diagnostic angiography, a 6  French sheath was exchanged for a #7 [place] sheath and a #7 [place]  3.5  guiding catheter with sideholes and __________  left coronary ostium. This  guide did not seat well in the left main as the patient was noted to have a  moderate amount of aortic root dilatation. This guide was then exchanged for  a JL 4.5; however, this as well would not sit well in the ostium of the left  main. We ultimately used a JL 4 guide with sideholes which was based in the  left coronary ostium. We then attempted to cross the mid LAD stenotic lesion  with a Patriot wire; however, this was unsuccessful. We then chose a Cross  It wire and after multiple attempts trying to cross the mid LAD total  occlusion, this was ultimately aborted. The follow-up angiogram revealed no  evidence of dissection or perforation. IV Angiomax was used. Of note, the  patient did have right and left collaterals with distal LAD.   CONCLUSION:  1. Failed angioplasty of the mid LAD total occlusion.  2. Widely patent OM stent.  3. Mildly depressed left ventricular systolic function with wall  motion     abnormalities noted above.  4. Adjunctive use of Angiomax.                                               Darlin Priestly, M.D.    RHM/MEDQ  D:  03/18/2002  T:  03/28/2002  Job:  [id number]   cc:   Buren Kos, M.D.

## 2011-01-10 NOTE — Discharge Summary (Signed)
[place]. [facility]  Patient:    [name] [name], [name] [name] Visit Number: [id number] MRN: [id number]          Service Type: MED Location: [id number] Attending Physician:  Darlin Priestly Dictated by:   [facility] [place], [place]. Admit Date:  09/08/2001 Discharge Date: 09/10/2001   CC:         Windle Guard, M.D.   Discharge Summary  ADMISSION DIAGNOSES: 1. Status post Cardiolite scan, July 13, 2001, revealing evidence of    inferolateral wall scar with anterior wall ischemia from the mid ventricle    into the apex; ejection fraction normal at 50%. 2. History of coronary artery disease:    a. status post myocardial infarction, March 05, 2001, involving second       obtuse marginal - she underwent Angiogect treatment with subsequent       percutaneous transluminal coronary angioplasty/stent of the obtuse       marginal-2 at that time, and;    b. status post repeat catheterization March 08, 2001 revealing widely patent       obtuse marginal stent - there was an 80% residual left anterior       descending disease, which was treated medically. 3. Hypertension. 4. Hyperlipidemia. 5. Iron-deficiency anemia. 6. Osteoarthritis. 7. History of pseudoaneurysm, left groin, status post compression,    March 12, 2001.  DISCHARGE DIAGNOSES: 1. Status post Cardiolite scan, July 13, 2001, revealing evidence of    inferolateral wall scar with anterior wall ischemia from the mid ventricle    into the apex, ejection fraction normal at 50%. 2. History of coronary artery disease:    a. status post myocardial infarction, March 05, 2001, involving second       obtuse marginal - she underwent Angiogect treatment with subsequent       percutaneous transluminal coronary angioplasty/stent of the obtuse       marginal-2 at that time, and,    b. status post repeat catheterization March 08, 2001 revealing widely patent       obtuse marginal stent - there was an 80% residual left  anterior       descending disease, which was treated medically. 3. Hypertension. 4. Hyperlipidemia. 5. Iron-deficiency anemia. 6. Osteoarthritis. 7. History of pseudoaneurysm, left groin, status post compression,    March 12, 2001. 8. Status post cardiac catheterization on September 08, 2001 by    Dr. Lenise Herald with cutting balloon angioplasty to the mid obtuse    marginal-1; as well, he performed cutting balloon angioplasty to the mid    left anterior descending.  She tolerated the procedure without    complications.  He planned to continue integrelin for 18 hours    postprocedure and continue Plavix 75 mg daily.  HISTORY OF PRESENT ILLNESS:  Ms. [name] [name] is a very pleasant 77 year old white female with a history of MI March 05, 2001 involving the second obtuse marginal.  She underwent Angiogect treatment with subsequent PTCA and stenting using a 2.75 x 22 cm VELOCITY.  She did have mild chest pain after her cardiac catheterization and underwent repeat catheterization on March 08, 2001 by Dr. Allyson Sabal revealing widely patent OM stent with an 80% lesion in the LAD, which was treated medically.  She had a Cardiolite scan performed July 13, 2001, which revealed evidence of inferolateral wall scar with an anterior wall ischemia from the mid ventricle into the apex.  EF was normal at 50%.  However, she reported having no recurrent  chest pain or shortness of breath.  She saw Dr. Jenne Campus in follow up office visit on August 04, 2001 at which time she continued to have anginal symptoms.  He discussed the need for cardiac catheterization given her known LAD disease at last cath and now with an abnormal Cardiolite.  They discussed risks and benefits of the procedure and she was agreeable with that approach.  HOSPITAL COURSE:  On September 08, 2001 [name] underwent cardiac catheterization by Dr. Lenise Herald.   She was found to have normal LV function with an EF of 60%.  He performed cutting  balloon angioplasty to th mid OM-1 and performed cutting balloon angioplasty to the mid LAD.  She tolerated the procedure well without complications.  He planned to continue integrelin for 18 hours postprocedure and he continued Plavix 75 mg daily.  On September 09, 2001 Ms. [name] [name] had no chest pain and remained stable; however, she did have some oozing in her groin sites.  Her integrelin had been turned off at 3 oclock AM, but her groin was still oozing that morning.  There was no hematoma and there was no significant bruit.  __________ was brought to the room in case necessary.  On September 10, 2001 Ms. [name] [name] was doing well.  She has had no further oozing from her groin and her groin is very stable with no hematoma, bruit or bleed. At this time she is doing well with no complaints.  She has no chest pain or shortness of breath, and has been walking without difficulty.  She was afebrile at 97.0, pulse 65, blood pressure 150/65 and oxygen sat 92% on room air.  Labs were stable and urinalysis was negative for UTI.  She has maintained sinus rhythm with no significant arrhythmias.  Her lungs were clear.  Heart was in regular rhythm and her groin was stable.  At this time she was seen and evaluated by Dr. Kem Boroughs who deemed her stable for discharge home.  HOSPITAL CONSULTS:  None.  HOSPITAL PROCEDURES: 1. Cardiac catheterization on September 08, 2001 by Dr. Lenise Herald.  He    found the following:    a. Left main had significant disease.    b. Left anterior descending was coarsely irregular through the proximal       and mid segments with up to 50-60% stenoses.  There was a high grade       90% lesion after the takeoff of the second diagonal.  First diagonal;       no significant disease.  Second diagonal; small vessel with 70%       ostial lesion.    c. Circumflex: atrioventricular groove circumflex has no significant       disease.  First obtuse marginal bifurcated in the midsegment  and noted        to have a stent in the midsegment.  There was a 70% in-stent restenosis.       Second obtuse marginal was a small vessel; no significant disease.    d. She had a large right coronary artery, which was dominant and gave rise       to posterior descending artery and posterolateral artery.  The right       coronary artery had a 40% proximal stenosis, but no further high grade       lesions.  Posterior descending artery and posterolateral artery free of       significant disease. 2. At that time Dr. Jenne Campus performed successful  cutting balloon angiolasty    using a 3.0 x 10 mm balloon in the first obtuse marginal in-stent stenotic    segment.  He also performed successful cutting balloon angioplasty of the    mid left anterior descending.  She tolerated the procedure without    complications.  He planned to continue integrelin for 18 hours    postprocedure and continue Plavix 75 mg daily.  LABORATORIES:  Cardiac enzymes negative with CK 129 and MB 2.5.  On admission white count 9.0, hemoglobin 10.7, hematocrit 30.8 and platelets 252,000. These values remained stable throughout the hospitalization without significant variation.  Also sodium was 142, potassium 4.2, glucose 97, BUN 11, and creatinine 0.9.  These values also remained stable throughout the hospitalization.  She also had a urinalysis that showed negative nitrites and negative leukocytes.  Chest x-ray showed cardiomegaly.  No evidence of congestive heart failure.  No acute disease.  EKG showed normal sinus rhythm, 68 beats/minute.  Nonspecific ST-T change.  Telemetry throughout the hospitalization had shown normal sinus rhythm with no significant arrhythmia.  DISCHARGE MEDICATIONS: 1. Plavix 75 mg once a day for a month and after follow up visit she is to    discuss with Dr. Jenne Campus how long he wants her to continue the Plavix. 2. Enteric coated aspirin 325 mg once a day. 3. Toprol XL 25 mg once a day. 4.  Lipitor 10 mg once a day. 5. Diovan 160 mg once a day. 6. Iron 150 mg once a day. 7. Nitroglycerin 0.4 mg sublingual as directed for chest pain.  ACTIVITY:  No strenuous activity, lifting more than five pounds, driving or sexual activity for three days.  DIET:  Low-salt, low-fat, low-cholesterol diet.  WOUND CARE:  May gently wash her groin with warm water and soap.  SPECIAL INSTRUCTIONS:  Call the office at [phone] for any bleeding, or increased size or pain in the groin.  FOLLOW-UP:  I have scheduled her an appointment to follow up with Dr. Jenne Campus on Friday, January 31st, at 9:45 AM. Dictated by:   [facility] [place], [place]. Attending Physician:  Darlin Priestly DD:  09/10/01 TD:  09/12/01 Job: [id number]

## 2011-01-10 NOTE — Discharge Summary (Signed)
[name] [name], [name] [name]                   ACCOUNT NO.:  [id number]   MEDICAL RECORD NO.:  [id number]          PATIENT TYPE:  INP   LOCATION:  5156                         FACILITY:  [facility]   PHYSICIAN:  Velora Heckler, MD      DATE OF BIRTH:  [birth year]   DATE OF ADMISSION:  08/06/2007  DATE OF DISCHARGE:  08/14/2007                               DISCHARGE SUMMARY   CHIEF COMPLAINT/REASON FOR ADMISSION:  [name] [name] is an 77 year old female  patient with history of coronary artery disease, known  cholelithiasis  and had been evaluated by Dr. Lindie Spruce in the office for planned  cholecystectomy on an elective basis.  The patient is on Plavix for her  cardiac status.  She has been having biliary colic symptoms since July  8119.  Intermittently since that time, the patient has had milder  episodes of biliary colic, but over the past week, the patient noticed  increased symptoms, inability to tolerate food more than Jello and a  bland diet.  She also developed low-grade fevers and has had significant  epigastric and right upper quadrant pain radiating to the back since 4  a.m. on the morning of admission.  In the ER, she did have a  leukocytosis of 11,800 with mild transaminitis.  Ultrasound demonstrated  a 2 cm stone impacted in the neck of the gallbladder with a common bile  duct dilated at 1 cm.  The patient's last dose of Plavix was the day  before.  On exam, the patient was tender in the right upper quadrant and  lateral presentation, but consistent with a Murphy sign. Minimal  guarding.  No rebounding.  Stable umbilical hernia which was nontender.  Again, she had leukocytosis with a left shift.  Neutrophils 80%, total  bilirubin 1.5, alkaline phosphatase 326, AST 206, ALT 133, lipase was  normal.  Normal ultrasound findings with impacted gallbladder neck  stone.   ADMISSION DIAGNOSES:  1. Biliary colic/abdominal pain secondary to impacted gallbladder neck      stone.  2. Transaminitis secondary  to impacted gallbladder neck stone.  3. Leukocytosis secondary to the previously stated 2 problems.  4. Stable, asymptomatic coronary artery disease on Plavix.  5. Hypertension.  6. Dyslipidemia.   HOSPITAL COURSE:  The patient was admitted to the general floor so she  could receive IV fluid hydration, pain medications and nausea  antiemetics.  She was placed on Avelox empirically and made n.p.o.  Plavix was placed on hold with plans to proceed with cholecystectomy  after several days of holding the Plavix and after the patient was  surgically cleared by Dr. Jenne Campus.   By hospital day 2, the patient's white count had decreased.  Her pain  had decreased.  So she was started on a liquid low-fat diet.  She was  subsequently cleared for surgical procedure by Dr. Jenne Campus.  She  underwent laparoscopic cholecystectomy with IOC as well as an incidental  repair of umbilical hernia per Dr. Gerrit Friends on August 09, 2007.  She was  found to have chronic cholecystitis, cholelithiasis, choledocholithiasis  as  well as an incarcerated umbilical hernia.  The IOC was positive for  common bile duct stones, so gastroenterology consult was obtained to  determine if the patient was appropriate for ERCP.  The patient was  evaluated by gastroenterology.  She subsequently underwent an ERCP on  August 10, 2007 with successful retrieval of stones and  sphincterotomy.   Over the next several days, the patient remained somewhat weak, burning  with urination. Urine culture was obtained and the patient was placed  empirically on Cipro.  She also had symptoms consistent with a mild  postoperative ileus, so diet was advanced slowly.  She also had a JP  drain placed in the intraoperative period and this had serosanguineous  fluid.  She also had a bump in her white count to 18,000 which decreased  after the initiation of Cipro.  By postop day 3, the patient was passing  flatus.  Her abdomen was much softer.  She had  improved bowel sounds,  but otherwise her incisions remained clean, dry and intact.  She was  advanced to full liquid diet with plans to begin more solid diet by  postop day 4.  In addition, Toradol was added for her discomfort and  this helped much with her pain control.  I did discuss with the patient  and daughter very cautious use of over-the-counter ibuprofen and low  doses every 8-12 hours after discharge if needed.  The patient is on  Plavix on a long-term basis.  The patient's urine culture was not  available on date of discharge, but I have reviewed in the e-chart and  the urine culture showed no growth.   By postoperative day 5, the patient was tolerating a solid diet.  Vital  signs were stable.  Her abdomen was soft.  Wounds were stable.  Bowel  sounds were present.  Her white count had decreased to 11,300.  Her LFTs  which had briefly bumped immediately  post-ERCP were trending down nicely.  The last readings from August 12, 2007, demonstrating a total bilirubin 0.9, alkaline phosphatase 163,  AST 31 and ALT 37.  Her JP drain was discontinued and she was otherwise  deemed appropriate for discharge home.   DISCHARGE DIAGNOSES:  1. Biliary colic and abdominal pain secondary to acute on chronic      cholecystitis and choledocholithiasis.  2. Status post laparoscopic cholecystectomy with positive      intraoperative cholangiogram, repair of incarcerated umbilical      hernia.  3. Status post endoscopic retrograde cholangiopancreatography with      sphincterotomy and stone retrieval.  4. Leukocytosis and presumed urinary tract infection.  Patient with      dysuria.  Urine culture negative.  5. History of coronary artery disease on Plavix.  6. Hypertension.   DISCHARGE MEDICATIONS:  The patient will continue the following home  medications:  1. Aspirin 81 mg daily.  2. Diovan 160 mg daily.  3. Lopressor 25 mg daily.  4. Plavix 75 mg daily.  5. Crestor 10 mg daily.  6.  Cosopt 1 drop to both eyes b.i.d.  7. In addition, she will take Vicodin 5/325 one-two tabs every 4 hours      as needed for pain.  8. Over-the-counter ibuprofen 1 tablet every 8 hours as needed in      addition to Vicodin for pain management.  Stop if you develop      indigestion since you were taking Plavix.  9. Cipro 500 mg b.i.d.  for 5 days.   ACTIVITY:  The patient is okay to shower and ambulate without  assistance.  She is not to drive while taking any Vicodin.  She is not  to lift anything greater than 10 pounds for 2 weeks.   FOLLOW UP:  1. She is to follow with Dr. Gerrit Friends in the office in 2 weeks.  She      needs to call for an appointment.  2. She is to follow up with Dr. Ewing Schlein as instructed.  3. She is to call Dr. Ardine Eng office if she develops any problems      with her wounds, fever greater than 101 or severe pain.      Allison L. Rennis Harding, N.P.      Velora Heckler, MD  Electronically Signed    ALE/MEDQ  D:  09/01/2007  T:  09/01/2007  Job:  [id number]   cc:   Petra Kuba, M.D.  Darlin Priestly, MD

## 2011-01-10 NOTE — Cardiovascular Report (Signed)
[place]. [facility]  Patient:    JACQUEL, REDDITT Visit Number: [id number] MRN: [id number]          Service Type: CAT Location: [id number] Attending Physician:  Darlin Priestly Dictated by:   Darlin Priestly, M.D. Proc. Date: 09/08/01 Admit Date:  09/08/2001   CC:         Buren Kos, M.D.   Cardiac Catheterization  PROCEDURES: 1. Left heart catheterization. 2. Coronary angiography. 3. Left ventriculogram. 4. Obtuse marginal #1 - mid;  Cutting Balloon angioplasty. 5. Left anterior descending - mid; Cutting Balloon angioplasty.  COMPLICATIONS: None.  INDICATIONS: The patient is an 77 year old female, patient of Dr. Jeannetta Nap with a history of hypertension, hypercholesterolemia, history of CAD, status post myocardial infarction July of 2002, with subsequently AngioJet AMI trial and stenting of her OM. She had repeat catheterization approximately three days after that procedure revealing widely patent OM stent. She recently underwent Cardiolite scan to assess ischemia in her LAD territory.  She was noted to have 80% mid LAD lesion. This revealed anterior wall ischemia. She is now brought to the cardiac catheterization lab to reassess her LAD.  DESCRIPTION OF PROCEDURE: After given informed written consent, the patient was brought to the cardiac catheterization lab where her right and left groins were shaved, prepped, and draped in the usual sterile fashion.  ECG monitoring was established.  Using modified Seldinger technique, a #7 French arterial sheath was inserted in the right femoral artery. Next, a #6 [place] JR4 diagnostic catheter was then coaxially engaged in the right coronary ostium. This reveals a large RCA which is dominant and gives rise to both the PDA as well as the posterolateral branch. The RCA is noted to have 40% proximal stenosis but no further high-grade lesion. The PDA and posterolateral branch have no significant  disease.  This diagnostic catheter was then exchanged for a #7 [place] guiding catheter which was coaxially engaged in the left coronary ostium. This reveals a large left main with no significant disease.  The LAD is a medium sized vessel in its proximal and midportion and a small vessel distally. It gives rise to a small proximal diagonal and a small diagonal which arises in the mid segment. The LAD is noted to be coarsely irregular through its proximal and mid segments with up to 50-60% stenosis. There is a high-grade 90% lesion after the takeoff of the second diagonal. This vessel is a 2-1/4 vessel throughout its midportion. The first diagonal was a small vessel with no significant disease. The second diagonal is a small vessel with 70% ostial lesion.  Left circumflex is a large vessel which coursed in the AV groove and gave rise to two obtuse marginal branches. The AV groove circumflex has no significant disease. The first OM is a large vessel which bifurcates in its mid segment and is noted to have a stent in its mid segment. There is 70% in-stent re-stenosis. There is TIMI-3 flow distal.  The second OM is a small vessel with no significant disease.  HEMODYNAMICS: Systemic arterial pressure 163/68, LV systemic pressure 163/12, LVEDP of 14.  INTERVENTIONAL PROCEDURE: OM-1 - mid: Following diagnostic angiography, a short 0.014 Patriot guide wire was advanced out of the guiding catheter into the proximal circumflex. This guide wire was then positioned in the distal first OM without difficulty. Next, a Scimed 3.0 x 10 mm Cutting Balloon was advanced within the previously placed OM stent. Four subsequent inflations for a total  of approximately 3 minutes and 45 seconds were then performed throughout the length of the stent. Followup angiogram revealed no evidence of dissection or thrombus with TIMI-3 flow to the distal vessel. There appeared to be less than 10% residual stenosis.  At this point, we turned our attention to the LAD. The guide wire was then pulled back into the left main and was positioned in the distal LAD without difficulty. Next, a 2.25 x 10 mm Cutting Balloon was then advanced into the mid LAD at the takeoff of the second diagonal. Two subsequent inflations to a maximum of 6 atmospheres were then performed for a total of three minutes. Followup angiogram revealed less than 10% residual stenosis in the mid LAD with no significant encroachment in the diagonal.  There was TIMI-3 flow of the distal vessel. At this point, we elected to conclude the procedure. All balloons, wires, and catheters were removed. It should be noted that IV Integrilin was used throughout the case. Intravenous doses of heparin were given to maintain the ACT between 200 and 300.  Final orthogonal angiograms revealed less than 10% residual stenosis in the mid LAD and less than 10% residual stenosis in the mid first OM in-stent re-stenosis. At this point, the sheaths were sewn in place and the patient was transferred back to the ward in stable condition.  CONCLUSIONS: 1. Successful Cutting Balloon angioplasty using a 3.0 x 10 mm balloon in the    first obtuse marginal in-stent stenotic segment. 2. Successful Cutting Balloon angioplasty of the mid left anterior descending. 3. Systemic hypertension. 4. Adjunct use of Integrilin infusion. Dictated by:   Darlin Priestly, M.D. Attending Physician:  Darlin Priestly DD:  09/08/01 TD:  09/08/01 Job: [id number]

## 2011-01-10 NOTE — H&P (Signed)
[place]. [facility]  Patient:    [name] [name] Visit Number: [id number] MRN: [id number]          Service Type: CAT Location: [facility] 2852 [id number] Attending Physician:  Darlin Priestly Dictated by:   [facility] [place], [place]. Admit Date:  09/08/2001   CC:         Buren Kos, M.D.   History and Physical  HISTORY OF PRESENT ILLNESS:  [name] [name] is a 77 year old, white female with a history of myocardial infarction on March 05, 2001, involving a second obtuse marginal.  She underwent Angio-Jet treatment with subsequent PTCA stenting using a 2.75 x 22 mm velocity.  She did have mild chest pain after the cardiac catheterization and underwent repeat catheterization on March 08, 2001, by Dr. Allyson Sabal revealing widely patent OM stent with an 80% lesion of the LAD. This was treated medically.  Also, during that hospitalization, she did have a pseudoaneurysm of the left groin which was compressed on March 12, 2001.  She had a followup Cardiolite scan on July 13, 2001, which revealed evidence of inferolateral wall scar with anterior wall ischemia from the mid ventricle into the apex.  EF was noted to be normal at 50%.  Despite her Cardiolite showing perfusion abnormality, [name] [name] denied having any recurrent chest pain or shortness of breath at followup visits.  PAST MEDICAL HISTORY: 1. Coronary artery disease.    a. Status post myocardial infarction on March 05, 2001, with Angio-Jet, PTCA       stent to the OM-2.    b. Repeat catheterization on March 08, 2001, with patent OM-2.  She did have       an 80% LAD stenosis which was medically treated.    c. Status post Cardiolite on July 13, 2001, which showed a moderate to       large size later wall, nontransmural myocardial infarction with minimal       peri-infarct ischemia in the distribution of the circumflex coronary       artery.  As well, there was small size moderate ischemia in the apex in       the distribution  of the distal anterior descending artery.  This was a       reversible defect.  EF was approximately 50%. 2. History of pseudoaneurysm, left groin, status post compression on March 12, 2001. 3. Hypertension. 4. Hyperlipidemia. 5. Osteoarthritis.  MEDICATIONS: 1. Toprol XL 25 mg q.d. 2. Lipitor 10 mg q.d. 3. Enteric coated aspirin 325 mg q.d. 4. Diovan 160 mg once a day. 5. Iron 150 mg once a day.  ALLERGY:  PENICILLIN.  CODEINE causes significant GI upset.  No allergy to catheterization dye.  SOCIAL HISTORY:  She is married with four children.  She uses no tobacco and no alcohol.  FAMILY HISTORY:  Noncontributory to this.  REVIEW OF SYSTEMS:  GASTROINTESTINAL:  She denies any peptic ulcer disease. No GI bleed.  NEUROLOGIC:  No TIA or CVA.  ENDOCRINE:  No diabetes and no thyroid disease.  GENERAL:  No recent fevers, chills or significant malaise.  PHYSICAL EXAMINATION:  VITAL SIGNS:  Temperature 96.9, heart rate 76, blood pressure 168/93.  Height 5 feet 4 inches, weight 188.  GENERAL:  She is a well-nourished, well-developed, white female who appears in no acute distress.  NECK:  Supple with no JVD, 2+ bilateral carotid pulses without bruits.  LUNGS:  Clear.  Good breath sounds throughout.  HEART:  Regular rhythm with no murmur, rub, gallop or clicks.  ABDOMEN:  Soft without mass.  INTEGUMENTARY:  No bruit or pulsatile mass.  She does have severe varicosities around the ankles.  EXTREMITIES:  There is no lower extremity edema.  IMPRESSION: 1. History of coronary artery disease with history of myocardial infarction on    March 05, 2001, with percutaneous transluminal coronary angioplasty stent to    the obtuse marginal-2. 2. Status post Cardiolite on July 13, 2001, showing some ischemic regions. 3. Hypertension. 4. Hyperlipidemia. 5. Osteoarthritis.  PLAN:  At this time, [name] [name] has had no significant anginal symptoms.  She has had an abnormal Cardiolite  stress test.  Given her known coronary artery disease and abnormal Cardiolite, it was felt that we should proceed with diagnostic cardiac catheterization and possible intervention if needed.  This was discussed with her at the office visit on August 04, 2001, with Dr. Jenne Campus and she was agreeable with this approach.  She understands the risks and benefits of procedure and is agreeable to proceed.  She now presents for cardiac catheterization plus/minus angioplasty. Dictated by:   [facility] [place], [place]. Attending Physician:  Darlin Priestly DD:  09/08/01 TD:  09/08/01 Job: [id number]

## 2011-01-10 NOTE — Cardiovascular Report (Signed)
[place]. [facility]  Patient:    [name] [name], [name] [name]                            MRN: [id number] Proc. Date: 03/05/01 Adm. Date:  [id number] Attending:  Lenise Herald H CC:         Buren Kos, M.D.   Cardiac Catheterization  PROCEDURES: 1. Left heart catheterization. 2. Coronary angiography. 3. Left ventriculogram. 4. Ascending aortography. 5. Obtuse marginal-mid    - Percutaneous transluminal coronary balloon angioplasty.    - Placement of intracoronary stent.  ATTENDING:  Lenise Herald, M.D.  COMPLICATIONS:  None.  INDICATIONS:  [name] [name] is a 77 year old white female with no prior cardiac history who presents to the ER on March 05, 2001, with chest pain which began at 1445 with radiation to her throat and both arms.  The patient had no relief with sublingual nitroglycerin and morphine.  She was noted in the ER to have inferior ST elevation and lateral depression and is now brought to the cardiac catheterization lab for percutaneous intervention.  DESCRIPTION OF PROCEDURE:  After giving informed written consent, the patient was brought to the cardiac catheterization lab where her right and left groins were shaved, prepped, and draped in the usual sterile fashion.  ECG monitoring was established.  Using the modified Seldinger technique, a 7-French arterial sheath was inserted in the right femoral artery.  The 6-French diagnostic catheters were then used to perform diagnostic angiography.  FINDINGS: 1. This reveals a large left main with no significant disease. 2. The LAD is a medium-sized vessel which coursed to the apex and gave rise to    three diagonal branches.  The LAD is noted to have a mid 80% stenosis.    This involves the third diagonal which is 70% stenosed in its ostium.  The    first and second diagonals are small vessels with no significant disease. 3. The left circumflex is a large vessel which coursed in the AV groove and    gave  rise to three obtuse marginal branches.  The AV groove circumflex has    no significant disease.  The first OM is a small vessel with no significant    disease.  The second OM is a large vessel with a mid 99% stenotic lesion    and TIMI-2 flow to the distal vessel.  A third OM is a small vessel with no    significant disease. 4. The right coronary artery is a large vessel which is dominant and gives    rise to a PDA as well as a posterolateral branch.  There is a 40% proximal    and 30% distal RCA stenotic lesion.  The PDA is noted to have a 40% ostial    lesion but no further significant lesion are noted.  A left ventriculogram reveals an EF of approximately 30-35% with anterolateral apical and inferoapical hypokinesis.  There is no significant MR.  An ascending aortography reveals trivial aortic regurgitation with moderately dilated ascending root.  HEMODYNAMICS:  Systemic arterial pressure 153/85, LV systemic pressure 153/20, LVEDP of 29.  INTERVENTIONAL PROCEDURE (OM-2 MID):  Following diagnostic angiography, a 7-French JL4.5 guiding catheter was coaxially engaged in the left coronary ostium.  Next, a 0.014 exchange length Patriot guidewire was advanced out of the guiding catheter to the proximal circumflex and used to cross the second OM stenotic lesion and positioned in the distal OM without difficulty.  Next, a Maverick 2.5- x 15-mm balloon was then used to cross the mid OM stenotic lesion and this balloon was subsequently inflated to a maximum of eight atmospheres for a total of one minute.  Follow-up angiogram revealed a small area of dissection at the ______ stenosis site.  There was TIMI-3 flow in the distal vessel.  This balloon was then removed and a BX Velocity 2.75- x 23-mm stent was then tracked across the lesion.  This stent was then deployed to a maximum of 16 atmospheres for a total of one minute.  Follow-up angiogram revealed no evidence of dissection or thrombus  with TIMI-3 flow to the distal vessel.  Integrilin was given.  Intravenous doses of heparin were given to maintain the ACT between 200 and 300.  Final orthogonal angiograms revealed less than 10% residual stenosis in the mid OM stenotic lesion with no evidence of dissection or thrombus and TIMI-3 flow to the distal vessel.  At this point, we elected to conclude the procedure.  All balloons, wires, and catheters were removed.  The hemostatic sheath was sewn in place, and the patient was transferred back to the ward in stable condition.  CONCLUSIONS: 1. Successful percutaneous transluminal coronary balloon angioplasty and    placement of a BX Velocity 2.75- x 23-mm coronary stent in the mid second    obtuse marginal stenotic lesion. 2. Significant two-vessel coronary artery disease. 3. Moderately depressed ejection fraction with wall motion abnormalities noted    above. 4. Moderately dilated ascending aorta. 5. Systemic hypertension. 6. Enrollment in the acute myocardial infarction AngioJet study. DD:  03/05/01 TD:  03/05/01 Job: [id number]

## 2011-01-10 NOTE — Cardiovascular Report (Signed)
[employer]. [facility]  Patient:    [name] [name], [name] [name]                            MRN: [id number] Adm. Date:  [id number] Attending:  Darlin Priestly CC:         Patients chart  2nd floor catheter lab  [facility]  [address] [address]   Cardiac Catheterization  HISTORY OF PRESENT ILLNESS:  [name] [name] is a 77 year old white female admitted on March 05, 2001 with an acute lateral wall MI.  She was taken to the catheter lab by Dr. Lenise Herald revealing a subtotally occluded large circumflex marginal branch which wrapped the apex.  This was stented successfully with a peak CK of approximately 2000.  She has had some post-MI chest pain and some involving lateral T-wave inversions.  Because of this, she is brought back to the catheter lab for relook angiography.  DESCRIPTION OF PROCEDURE:  The patient was brought to the second floor [place] [facility]ter Lab in the postabsorbtive state.  She was premedicated with p.o. Valium and her left groin was prepped and draped in the usual sterile fashion.  Xylocaine 1% was used for local anesthesia.  A 6 French sheath was inserted into the left femoral artery using standard Seldinger technique. A 6 French right and left Judkins diagnostic catheter along with a 6 French pigtail catheter were used for selective coronary angiography, left ventriculography in RAO and LAO view, and distal abdominal aortography.  Omnipaque dye was used through the entirety of the case. Retrograde aorta, left ventricular, and pullback pressures were recorded.  RESULTS:  Hemodynamics: 1. Aortic systolic pressure 191 and diastolic pressure 96. 2. Left ventricular systolic pressure 188 and diastolic pressure 38.  Selective coronary angiography: 1. Left main normal. 2. LAD:  The LAD had approximately 80% fairly focal stenosis up to the    third diagonal branch intersection of the mid and distal third of  the LAD. 3. Left circumflex:  The previously placed stent was widely patent with    TIMI III flow. 4. Right coronary artery:  This was a dominant vessel with at most 30-40%    eccentric proximal stenosis.  Left ventriculography:  RAO and LAO left ventriculograms were performed using 20 cc of Omnipaque dye at 10 cc per second in each view.  The overall LV/EF was estimated at approximately 45% with severe anteroapical hypokinesia.  Distal abdominal aortography:  Distal abdominal aortogram was performed using 20 cc of Omnipaque at 20 cc per second x 2.  The renal arteries were widely patent.  The infrarenal, abdominal aorta, and the iliac bifurcation appeared free of significant atherosclerotic changes.  [name] [name] vessel was widely patent.  Anatomy is unchanged from postprocedure. I believe that her symptoms are post-MI pain not related to ischemia and her T-wave evolution is part of the normal infarct process.  PLAN:  Plans will be to remove the sheath.  Preps will be held to the groin to achieve hemostasis.  Heparin and Integrilin will be discontinued.  The patient will be treated with the usual post-MI medical regimen.  She will be ambulated for the next two to three days and discharged home if she remains clinically stable.  She left the lab in stable condition. DD:  03/08/01 TD:  03/08/01 Job: [id number]

## 2011-01-10 NOTE — Discharge Summary (Signed)
[place]  Patient:    [name] [name], [name] [name]                            MRN: [id number] Adm. Date:  [id number] Disc. Date: [id number] Attending:  Osvaldo Human Dictator:   Halford Decamp. Delanna Ahmadi, R.N., N.P. CC:         Buren Kos, M.D.   Discharge Summary  HISTORY OF PRESENT ILLNESS:  [name] [name] was admitted to the hospital secondary to chest pain.  She had had no prior cardiac history.  She apparently had chest pain, radiation to her throat and both arms.  EMS was called.  She received three nitroglycerin with mild relief.  She was then given 4 mg of morphine and four baby aspirin in the ER.  She had ST segment elevation up in her inferior leads.  She was given IV nitroglycerin, IV heparin, and oxygen.  Her pain went from a 10/10 to a 7-8.  Two more milligrams of morphine were given.  She was taken to the catheterization lab for an urgent cardiac catheterization secondary to a positive MI with ST elevation on her EKG.  Catheterization was performed by Dr. Lenise Herald. It revealed anterolateral apical and inferoapical hypokinesis with an EF of 30-35%.  She had a 99% OM2, an 80% LAD, a 70% diagonal 3, a 40% proximal RCA, 30-40% mid to distal RCA.  She underwent stenting to her OM2, mid lesion reduced from 99 to less than 10.  The day after her catheterization, she did have EKG changes that looked like evolving lateral MI.  She had an episode of nausea with blood pressure down, heart rate down, and some unresponsiveness. She was given fluids with return of her blood pressure.  Because of this incident, it was decided that she should undergo repeat cardiac catheterization.  This was performed on March 08, 2001 by Dr. Nanetta Batty. Her stent in her OM2 was widely patent.  She continued to have a mid LAD lesion and a 30-40% RCA.  Her EF was improved to 45-50%.  The following day, she felt somewhat nauseated.  Ambulation was held until March 10, 2001.   She was only able to ambulate 500 feet with assistance.  She had weakness and fatigue.  She then developed a left groin hematoma.  Ultrasound was called. She did have a pseudoaneurysm.  Then on March 12, 2001, she underwent groin compression.  Repeat Dopplers the following day showed a thrombosed pseudoaneurysm.  On March 14, 2001, she was considered stable to be discharged home, seen by Dr. Elsie Lincoln.  A 2-D echocardiogram performed March 08, 2001 showed LV size normal, overall LV function was normal, EF of 55-65%.  She had moderate hypokinesis of the mid distal inferior septal wall, moderate to severe tricuspid valve regurgitation.  LABORATORY DATA:  March 14, 2001:  Hemoglobin 10.6, hematocrit 30.6, WBC 10.9, MCV 90.7.  Sodium 139, potassium 4.4, BUN 20, creatinine 1.0, glucose 103. CK-MB #1:  130/2.9 and a troponin of 0.03.  #2:  2130/865 with a troponin of 66.34.  #3:  1711/176 with a troponin of 40.68.  #4:  546/19.7 with a troponin of 16.39.  #5:  440/13.4 with a troponin of 12.5.  Total cholesterol was 180, triglycerides 270, HDL was 31, LDL was 95.  This was on March 06, 2001.  TSH was 5.372.  March 11, 2001:  Iron was 37, TIBC was 252, percent saturation  was 15, B12 was 410, and folate was 18.  Urine was positive for nitrites, moderate leukocyte esterase.  Urine culture showed greater than 100,000 colony of E. coli.  She was treated for her UTI.  DISCHARGE MEDICATIONS:  1. Plavix 75 mg a day x 1 month.  2. Enteric-coated aspirin 325 mg every day.  3. Zocor 20 mg at night.  4. Altace 5 mg every day.  5. Toprol XL 25 mg every day.  6. Nu-Iron 150 mg twice a day.  7. Nitroglycerin p.r.n.  ACTIVITY:  She is to do no strenuous activity, lifting, or driving x 2 days.  DIET:  She will be on a low-fat/low-cholesterol diet.  WOUND CARE:  She can shower.  FOLLOW-UP:  She will follow up with Dr. Jenne Campus on August 7 at 10:30.  DISCHARGE DIAGNOSES:  1. Acute lateral myocardial  infarction.  2. Subsequent catheterization and percutaneous transluminal coronary     angioplasty and stenting to her second obtuse marginal.  3. Residual coronary artery disease with 80% in her mid left anterior     descending, 30-40% in her proximal right coronary artery.  4. Recovery of left ventricular function with an ejection fraction on second     catheterization March 08, 2001 of 45-50%.  5. Vasovagal reaction March 07, 2001 with subsequent recatheterization because     of symptoms March 08, 2001 with patent stent.  6. Groin pseudoaneurysm with compression.  7. Hyperlipidemia.  8. Debilitation.  9. Urinary tract infection. 10. Post-procedure anemia. DD:  03/31/01 TD:  04/02/01 Job: [id number]

## 2011-05-20 LAB — POCT CARDIAC MARKERS
CKMB, poc: 3
Myoglobin, poc: 234
Operator id: 257131
Operator id: 257131
Troponin i, poc: <0.05

## 2011-05-20 LAB — POCT I-STAT, CHEM 8
BUN: 31 — ABNORMAL HIGH
Calcium, Ion: 1.16
Chloride: 104

## 2011-05-20 LAB — URINALYSIS, ROUTINE W REFLEX MICROSCOPIC
Glucose, UA: NEGATIVE
Ketones, ur: NEGATIVE
pH: 7

## 2011-05-20 LAB — CBC
MCV: 89.6
Platelets: 337
RBC: 3.41 — ABNORMAL LOW
WBC: 13 — ABNORMAL HIGH

## 2011-05-20 LAB — DIFFERENTIAL
Eosinophils Absolute: 0.3
Lymphocytes Relative: 18
Lymphs Abs: 2.3
Monocytes Relative: 9
Neutro Abs: 9.2 — ABNORMAL HIGH
Neutrophils Relative %: 71

## 2011-05-20 LAB — URINE MICROSCOPIC-ADD ON

## 2011-05-30 LAB — COMPREHENSIVE METABOLIC PANEL
ALT: 37 — ABNORMAL HIGH
ALT: 41 — ABNORMAL HIGH
ALT: 61 — ABNORMAL HIGH
ALT: 65 — ABNORMAL HIGH
AST: 31
AST: 38 — ABNORMAL HIGH
AST: 66 — ABNORMAL HIGH
Albumin: 2.4 — ABNORMAL LOW
Albumin: 2.4 — ABNORMAL LOW
Albumin: 3 — ABNORMAL LOW
Alkaline Phosphatase: 163 — ABNORMAL HIGH
Alkaline Phosphatase: 173 — ABNORMAL HIGH
Alkaline Phosphatase: 220 — ABNORMAL HIGH
BUN: 12
BUN: 19
BUN: 9
CO2: 27
CO2: 27
CO2: 27
Calcium: 8.7
Calcium: 8.7
Calcium: 8.8
Chloride: 100
Chloride: 101
Creatinine, Ser: 1
Creatinine, Ser: 1.03
Creatinine, Ser: 1.05
GFR calc Af Amer: 50 — ABNORMAL LOW
GFR calc Af Amer: >60
GFR calc Af Amer: >60
GFR calc non Af Amer: 41 — ABNORMAL LOW
GFR calc non Af Amer: 51 — ABNORMAL LOW
GFR calc non Af Amer: 53 — ABNORMAL LOW
Glucose, Bld: 105 — ABNORMAL HIGH
Glucose, Bld: 106 — ABNORMAL HIGH
Glucose, Bld: 133 — ABNORMAL HIGH
Glucose, Bld: 84
Potassium: 3.6
Potassium: 4
Potassium: 5.1
Sodium: 135
Sodium: 135
Sodium: 140
Total Bilirubin: 0.9
Total Bilirubin: 1.1
Total Protein: 6.2
Total Protein: 6.4
Total Protein: 6.6
Total Protein: 6.8

## 2011-05-30 LAB — CBC
HCT: 31.1 — ABNORMAL LOW
HCT: 31.2 — ABNORMAL LOW
HCT: 31.3 — ABNORMAL LOW
HCT: 32.7 — ABNORMAL LOW
Hemoglobin: 10.4 — ABNORMAL LOW
Hemoglobin: 10.4 — ABNORMAL LOW
Hemoglobin: 10.4 — ABNORMAL LOW
Hemoglobin: 11.4 — ABNORMAL LOW
Hemoglobin: 9.9 — ABNORMAL LOW
MCHC: 33.2
MCHC: 33.3
MCHC: 33.4
MCV: 91.8
MCV: 92.3
MCV: 92.4
MCV: 92.5
Platelets: 310
Platelets: 342
Platelets: 351
RBC: 3.17 — ABNORMAL LOW
RBC: 3.38 — ABNORMAL LOW
RBC: 3.39 — ABNORMAL LOW
RBC: 3.39 — ABNORMAL LOW
RBC: 3.75 — ABNORMAL LOW
RDW: 13
RDW: 13.5
RDW: 13.8
RDW: 13.9
WBC: 11.3 — ABNORMAL HIGH
WBC: 12.5 — ABNORMAL HIGH
WBC: 18 — ABNORMAL HIGH

## 2011-05-30 LAB — URINALYSIS, MICROSCOPIC ONLY
Bilirubin Urine: NEGATIVE
Glucose, UA: NEGATIVE
Hgb urine dipstick: NEGATIVE
Ketones, ur: 15 — AB
Leukocytes, UA: NEGATIVE
Nitrite: NEGATIVE
Protein, ur: NEGATIVE
Specific Gravity, Urine: 1.008
Urobilinogen, UA: 1
pH: 7.5

## 2011-05-30 LAB — URINE CULTURE
Colony Count: NO GROWTH
Culture: NO GROWTH

## 2011-05-30 LAB — DIFFERENTIAL
Basophils Absolute: 0
Basophils Relative: 0
Eosinophils Absolute: 0.5
Eosinophils Relative: 4
Lymphocytes Relative: 14
Lymphs Abs: 1.7
Monocytes Absolute: 1.4 — ABNORMAL HIGH
Monocytes Relative: 11
Neutro Abs: 8.6 — ABNORMAL HIGH
Neutrophils Relative %: 70

## 2011-05-30 LAB — APTT: aPTT: 42 — ABNORMAL HIGH

## 2011-05-30 LAB — TYPE AND SCREEN
ABO/RH(D): O POS
Antibody Screen: NEGATIVE

## 2011-05-30 LAB — PROTIME-INR
INR: 1.1
Prothrombin Time: 13.9
Prothrombin Time: 14

## 2011-05-30 LAB — ABO/RH: ABO/RH(D): O POS

## 2011-06-02 LAB — CBC
HCT: 32.6 — ABNORMAL LOW
MCHC: 33.2
MCHC: 34.4
MCV: 90.2
Platelets: 340
Platelets: 349
RBC: 3.61 — ABNORMAL LOW
RDW: 13.5
WBC: 11.8 — ABNORMAL HIGH

## 2011-06-02 LAB — LIPASE, BLOOD
Lipase: 24
Lipase: 26

## 2011-06-02 LAB — HEPATIC FUNCTION PANEL
Alkaline Phosphatase: 326 — ABNORMAL HIGH
Indirect Bilirubin: 0.7
Total Bilirubin: 1.5 — ABNORMAL HIGH

## 2011-06-02 LAB — I-STAT 8, (EC8 V) (CONVERTED LAB)
Acid-Base Excess: 4 — ABNORMAL HIGH
Bicarbonate: 28.6 — ABNORMAL HIGH
Chloride: 102
HCT: 38
Operator id: 282201
pCO2, Ven: 41.9 — ABNORMAL LOW
pH, Ven: 7.442 — ABNORMAL HIGH

## 2011-06-02 LAB — URINE MICROSCOPIC-ADD ON

## 2011-06-02 LAB — DIFFERENTIAL
Basophils Relative: 0
Eosinophils Absolute: 0.1 — ABNORMAL LOW
Eosinophils Relative: 1
Lymphs Abs: 1.1
Monocytes Relative: 10

## 2011-06-02 LAB — COMPREHENSIVE METABOLIC PANEL
ALT: 113 — ABNORMAL HIGH
AST: 113 — ABNORMAL HIGH
Albumin: 3.1 — ABNORMAL LOW
Calcium: 9.6
GFR calc Af Amer: 55 — ABNORMAL LOW
Glucose, Bld: 106 — ABNORMAL HIGH
Sodium: 139
Total Protein: 7.2

## 2011-06-02 LAB — URINALYSIS, ROUTINE W REFLEX MICROSCOPIC
Bilirubin Urine: NEGATIVE
Glucose, UA: NEGATIVE
Ketones, ur: NEGATIVE
Nitrite: NEGATIVE
Specific Gravity, Urine: 1.019
pH: 8

## 2011-06-02 LAB — AMYLASE: Amylase: 41

## 2011-06-02 LAB — POCT I-STAT CREATININE: Creatinine, Ser: 1.2

## 2012-06-05 ENCOUNTER — Other Ambulatory Visit: Payer: Self-pay

## 2012-06-05 ENCOUNTER — Encounter (HOSPITAL_COMMUNITY): Payer: Self-pay | Admitting: *Deleted

## 2012-06-05 ENCOUNTER — Emergency Department (HOSPITAL_COMMUNITY): Payer: Medicare Other

## 2012-06-05 ENCOUNTER — Inpatient Hospital Stay (HOSPITAL_COMMUNITY)
Admission: EM | Admit: 2012-06-05 | Discharge: 2012-06-08 | DRG: 470 | Disposition: A | Discharge status: Skilled Nursing Facility | Payer: Medicare Other | Attending: Internal Medicine | Admitting: Internal Medicine

## 2012-06-05 DIAGNOSIS — W010XXA Fall on same level from slipping, tripping and stumbling without subsequent striking against object, initial encounter: Secondary | ICD-10-CM | POA: Diagnosis present

## 2012-06-05 DIAGNOSIS — W19XXXA Unspecified fall, initial encounter: Secondary | ICD-10-CM

## 2012-06-05 DIAGNOSIS — I10 Essential (primary) hypertension: Secondary | ICD-10-CM | POA: Diagnosis present

## 2012-06-05 DIAGNOSIS — Y92009 Unspecified place in unspecified non-institutional (private) residence as the place of occurrence of the external cause: Secondary | ICD-10-CM

## 2012-06-05 DIAGNOSIS — I4510 Unspecified right bundle-branch block: Secondary | ICD-10-CM | POA: Diagnosis present

## 2012-06-05 DIAGNOSIS — Z7902 Long term (current) use of antithrombotics/antiplatelets: Secondary | ICD-10-CM | POA: Diagnosis present

## 2012-06-05 DIAGNOSIS — D6959 Other secondary thrombocytopenia: Secondary | ICD-10-CM

## 2012-06-05 DIAGNOSIS — H409 Unspecified glaucoma: Secondary | ICD-10-CM | POA: Diagnosis present

## 2012-06-05 DIAGNOSIS — S72019A Unspecified intracapsular fracture of unspecified femur, initial encounter for closed fracture: Secondary | ICD-10-CM

## 2012-06-05 DIAGNOSIS — R791 Abnormal coagulation profile: Secondary | ICD-10-CM | POA: Diagnosis present

## 2012-06-05 DIAGNOSIS — D649 Anemia, unspecified: Secondary | ICD-10-CM | POA: Diagnosis not present

## 2012-06-05 DIAGNOSIS — Z951 Presence of aortocoronary bypass graft: Secondary | ICD-10-CM

## 2012-06-05 DIAGNOSIS — S72002A Fracture of unspecified part of neck of left femur, initial encounter for closed fracture: Secondary | ICD-10-CM | POA: Diagnosis present

## 2012-06-05 DIAGNOSIS — I2510 Atherosclerotic heart disease of native coronary artery without angina pectoris: Secondary | ICD-10-CM | POA: Diagnosis present

## 2012-06-05 DIAGNOSIS — T45515A Adverse effect of anticoagulants, initial encounter: Secondary | ICD-10-CM | POA: Diagnosis present

## 2012-06-05 DIAGNOSIS — I252 Old myocardial infarction: Secondary | ICD-10-CM

## 2012-06-05 DIAGNOSIS — N390 Urinary tract infection, site not specified: Secondary | ICD-10-CM | POA: Diagnosis present

## 2012-06-05 DIAGNOSIS — S72009A Fracture of unspecified part of neck of unspecified femur, initial encounter for closed fracture: Principal | ICD-10-CM | POA: Diagnosis present

## 2012-06-05 LAB — URINALYSIS, MICROSCOPIC ONLY
Glucose, UA: NEGATIVE mg/dL
Ketones, ur: NEGATIVE mg/dL
Leukocytes, UA: NEGATIVE
Protein, ur: NEGATIVE mg/dL
pH: 5.5 (ref 5.0–8.0)

## 2012-06-05 LAB — CBC WITH DIFFERENTIAL/PLATELET
Eosinophils Relative: 2 % (ref 0–5)
HCT: 37.9 % (ref 36.0–46.0)
Hemoglobin: 12.5 g/dL (ref 12.0–15.0)
Lymphocytes Relative: 12 % (ref 12–46)
Lymphs Abs: 1.6 10*3/uL (ref 0.7–4.0)
MCV: 94.8 fL (ref 78.0–100.0)
Monocytes Absolute: 0.7 10*3/uL (ref 0.1–1.0)
Monocytes Relative: 6 % (ref 3–12)
Neutro Abs: 10.3 10*3/uL — ABNORMAL HIGH (ref 1.7–7.7)
RBC: 4 MIL/uL (ref 3.87–5.11)
RDW: 12.5 % (ref 11.5–15.5)
WBC: 12.9 10*3/uL — ABNORMAL HIGH (ref 4.0–10.5)

## 2012-06-05 LAB — COMPREHENSIVE METABOLIC PANEL
AST: 26 U/L (ref 0–37)
BUN: 24 mg/dL — ABNORMAL HIGH (ref 6–23)
CO2: 28 mEq/L (ref 19–32)
Calcium: 9.4 mg/dL (ref 8.4–10.5)
Chloride: 98 mEq/L (ref 96–112)
Creatinine, Ser: 0.89 mg/dL (ref 0.50–1.10)
GFR calc Af Amer: 64 mL/min — ABNORMAL LOW (ref >90)
GFR calc non Af Amer: 55 mL/min — ABNORMAL LOW (ref >90)
Glucose, Bld: 115 mg/dL — ABNORMAL HIGH (ref 70–99)
Total Bilirubin: 0.3 mg/dL (ref 0.3–1.2)

## 2012-06-05 LAB — PROTIME-INR
INR: 1.03 (ref 0.00–1.49)
Prothrombin Time: 13.4 seconds (ref 11.6–15.2)

## 2012-06-05 MED ORDER — ONDANSETRON HCL 4 MG/2ML IJ SOLN: 4.0000 mg | Freq: Four times a day (QID) | INTRAMUSCULAR | Status: DC | PRN

## 2012-06-05 MED ORDER — ONDANSETRON HCL 4 MG/2ML IJ SOLN
4.0000 mg | Freq: Once | INTRAMUSCULAR | Status: AC
  Administered 2012-06-05: 4 mg via INTRAVENOUS
  Filled 2012-06-05: qty 2

## 2012-06-05 MED ORDER — SODIUM CHLORIDE 0.9 % IV SOLN: INTRAVENOUS | Status: AC

## 2012-06-05 MED ORDER — FUROSEMIDE 20 MG PO TABS
20.0000 mg | ORAL_TABLET | Freq: Every day | ORAL | Status: DC
  Administered 2012-06-07 – 2012-06-08 (×2): 20 mg via ORAL
  Filled 2012-06-05 (×3): qty 1

## 2012-06-05 MED ORDER — DORZOLAMIDE HCL-TIMOLOL MAL PF 22.3-6.8 MG/ML OP SOLN
1.0000 [drp] | Freq: Two times a day (BID) | OPHTHALMIC | Status: DC
Start: 2012-06-05 — End: 2012-06-08
  Administered 2012-06-06 – 2012-06-08 (×5): 1 [drp] via OPHTHALMIC

## 2012-06-05 MED ORDER — METOPROLOL TARTRATE 25 MG PO TABS
25.0000 mg | ORAL_TABLET | Freq: Two times a day (BID) | ORAL | Status: DC
  Administered 2012-06-06 – 2012-06-07 (×3): 25 mg via ORAL
  Filled 2012-06-05 (×5): qty 1

## 2012-06-05 MED ORDER — POTASSIUM CHLORIDE IN NACL 20-0.45 MEQ/L-% IV SOLN
INTRAVENOUS | Status: DC
  Administered 2012-06-06: 100 mL/h via INTRAVENOUS
  Filled 2012-06-05 (×4): qty 1000

## 2012-06-05 MED ORDER — LATANOPROST 0.005 % OP SOLN
1.0000 [drp] | Freq: Every day | OPHTHALMIC | Status: DC
  Administered 2012-06-06 – 2012-06-07 (×3): 1 [drp] via OPHTHALMIC
  Filled 2012-06-05: qty 2.5

## 2012-06-05 MED ORDER — ONDANSETRON HCL 4 MG PO TABS: 4.0000 mg | ORAL_TABLET | Freq: Four times a day (QID) | ORAL | Status: DC | PRN

## 2012-06-05 MED ORDER — MORPHINE SULFATE 2 MG/ML IJ SOLN
1.0000 mg | INTRAMUSCULAR | Status: DC | PRN
  Administered 2012-06-06 – 2012-06-07 (×5): 1 mg via INTRAVENOUS
  Filled 2012-06-05 (×5): qty 1

## 2012-06-05 MED ORDER — SODIUM CHLORIDE 0.9 % IJ SOLN
3.0000 mL | Freq: Two times a day (BID) | INTRAMUSCULAR | Status: DC
  Administered 2012-06-07 – 2012-06-08 (×2): 3 mL via INTRAVENOUS

## 2012-06-05 MED ORDER — CIPROFLOXACIN HCL 500 MG PO TABS
500.0000 mg | ORAL_TABLET | Freq: Two times a day (BID) | ORAL | Status: DC
  Administered 2012-06-06 – 2012-06-07 (×2): 500 mg via ORAL
  Filled 2012-06-05 (×6): qty 1

## 2012-06-05 MED ORDER — MORPHINE SULFATE 4 MG/ML IJ SOLN
4.0000 mg | Freq: Once | INTRAMUSCULAR | Status: AC
  Administered 2012-06-05: 4 mg via INTRAVENOUS
  Filled 2012-06-05: qty 1

## 2012-06-05 MED ORDER — ONDANSETRON HCL 4 MG/2ML IJ SOLN: 4.0000 mg | Freq: Three times a day (TID) | INTRAMUSCULAR | Status: DC | PRN

## 2012-06-05 MED ORDER — DEXTROSE 5 % IV SOLN
1.0000 g | Freq: Once | INTRAVENOUS | Status: AC
  Administered 2012-06-05: 1 g via INTRAVENOUS
  Filled 2012-06-05: qty 10

## 2012-06-05 MED ORDER — MORPHINE SULFATE 2 MG/ML IJ SOLN: 4.0000 mg | INTRAMUSCULAR | Status: DC | PRN

## 2012-06-05 MED ORDER — SODIUM CHLORIDE 0.9 % IV SOLN
INTRAVENOUS | Status: DC
  Administered 2012-06-05: 21:00:00 via INTRAVENOUS

## 2012-06-05 NOTE — ED Notes (Addendum)
Pt now c/o R knee pain that is new. Protocol initiated for knee film as well.

## 2012-06-05 NOTE — ED Notes (Signed)
EKG printed and given to EDP [place] for review. Last confirmed EKG printed for comparison 

## 2012-06-05 NOTE — ED Provider Notes (Signed)
History     CSN: [id number]  Arrival date & time 06/05/12  1750   First MD Initiated Contact with Patient 06/05/12 1807      Chief Complaint  Patient presents with  . Fall    (Consider location/radiation/quality/duration/timing/severity/associated sxs/prior treatment) HPI  Patient reports about 3:45  Pm she was getting a couple of eggs out of the refrigerator and when she turned she lost her balance holding  the eggs in her hand and she fell onto her right side. She denies hitting her head or loss of consciousness. She states initially she didn't have any pain and was able to sit up and scored herself across the floor to get to a phone. She reports however now she is having pain in her right hip/groin area. She denies headache or chest pain at this time.  PCP Dr. Jeannetta Nap Cardiologist Dr. Tresa Endo  Past Medical History  Diagnosis Date  . Hypertension   . Cataract     both eyes  . Heart attack 2001    stents put in  . Arthritis   . Glaucoma   . Poor circulation     Past Surgical History  Procedure Date  . Abdominal hysterectomy 1972  . Eye surgery 2007/2008    cataracts  . Cardiac surgery 2001    stent placement  . Hernia repair 2009  . Gallbladder surgery 2009    Family History  Problem Relation Age of Onset  . Arthritis Father   . Stroke Father   . Hypertension Father   . Heart disease Sister   . Heart disease Brother   . Hypertension Brother   . Stroke Brother     History  Substance Use Topics  . Smoking status: Never Smoker   . Smokeless tobacco: Never Used  . Alcohol Use: No  Lives at home Lives alone  OB History    Grav Para Term Preterm Abortions TAB SAB Ect Mult Living   5 4   1  1          Review of Systems  All other systems reviewed and are negative.    Allergies  Codeine; Morphine and related; and Penicillins  Home Medications   Current Outpatient Rx  Name Route Sig Dispense Refill  . ASPIRIN 81 MG PO TABS Oral Take 81 mg by  mouth daily. And sometimes at night also     . CIPROFLOXACIN HCL 500 MG PO TABS Oral Take 500 mg by mouth 2 (two) times daily.    Marland Kitchen CLOPIDOGREL BISULFATE 75 MG PO TABS Oral Take 75 mg by mouth daily.     . DORZOLAMIDE HCL-TIMOLOL MAL PF 22.3-6.8 MG/ML OP SOLN Ophthalmic Apply 1 drop to eye every 12 (twelve) hours.    . FUROSEMIDE 20 MG PO TABS Oral Take 20 mg by mouth daily.     Marland Kitchen LATANOPROST 0.005 % OP SOLN Both Eyes Place 1 drop into both eyes at bedtime.    Marland Kitchen METOPROLOL TARTRATE 25 MG PO TABS Oral Take 25 mg by mouth 2 (two) times daily.      BP 187/60  Temp 97.9 F (36.6 C) (Oral)  Resp 18  SpO2 94%  Vital signs normal except hypertension   Physical Exam  Nursing note and vitals reviewed. Constitutional: She is oriented to person, place, and time. She appears well-developed and well-nourished.  Non-toxic appearance. She does not appear ill. No distress.  HENT:  Head: Normocephalic and atraumatic.  Right Ear: External ear normal.  Left Ear: External  ear normal.  Nose: Nose normal. No mucosal edema or rhinorrhea.  Mouth/Throat: Oropharynx is clear and moist and mucous membranes are normal. No dental abscesses or uvula swelling.  Eyes: Conjunctivae normal and EOM are normal. Pupils are equal, round, and reactive to light.  Neck: Normal range of motion and full passive range of motion without pain. Neck supple.  Cardiovascular: Normal rate, regular rhythm and normal heart sounds.  Exam reveals no gallop and no friction rub.   No murmur heard. Pulmonary/Chest: Effort normal and breath sounds normal. No respiratory distress. She has no wheezes. She has no rhonchi. She has no rales. She exhibits no tenderness and no crepitus.  Abdominal: Soft. Normal appearance and bowel sounds are normal. She exhibits no distension. There is no tenderness. There is no rebound and no guarding.  Musculoskeletal: Normal range of motion. She exhibits no edema and no tenderness.       Patient has no  shortening of her right lower leg. She has no external or internal rotation. However she does indicate she is painful in the true hip joint. She has minor discomfort when stressing her pelvis.   Neurological: She is alert and oriented to person, place, and time. She has normal strength. No cranial nerve deficit.  Skin: Skin is warm, dry and intact. No rash noted. No erythema. No pallor.  Psychiatric: She has a normal mood and affect. Her speech is normal and behavior is normal. Her mood appears not anxious.    ED Course  Procedures (including critical care time)  Medications  0.9 %  sodium chloride infusion (  Intravenous New Bag/Given 06/05/12 2036)  morphine 4 MG/ML injection 4 mg (4 mg Intravenous Given 06/05/12 1918)  ondansetron (ZOFRAN) injection 4 mg (4 mg Intravenous Given 06/05/12 1918)  cefTRIAXone (ROCEPHIN) 1 g in dextrose 5 % 50 mL IVPB (1 g Intravenous Given 06/05/12 2030)  morphine 4 MG/ML injection 4 mg (4 mg Intravenous Given 06/05/12 2049)   21:15 Dr Ophelia Charter, states he will do her surgery late tomorrow morning, keep NPO tonight.   09:35 Dr Onalee Hua admit to tele, team 8   Laboratory interpretation  Complete metabolic panel normal with minor elevation of glucose and mild renal insufficiency  PT/INR normal  PTT 40 which is minimally elevated  CBC shows white count 12,900, hemoglobin 12.5, hematocrit 37.9, bili countenance 32,000 which is normal except for leukocytosis  UA specific gravity 1.016, pH 5.5, trace hemoglobin, 0-2 EDC, 0-2 RBC, few bacteria, negative nitrate and negative leukocytes    Dg Hip Complete Right  06/05/2012  *RADIOLOGY REPORT*  Clinical Data: Fall.  Right hip pain.  RIGHT HIP - COMPLETE 2+ VIEW  Comparison: 10/18/2010  Findings: Fragmented spur noted along the left anterior inferior iliac spine, chronic.  Lower lumbar spondylosis is observed.  The patient is unable to fully externally rotate the right hip, reducing sensitivity in assessment.  On  the cross table lateral view it is difficult to determine which side of the right and which sized left, although I suspect that the right hip is more anterior. There is some irregularity the junction the right femoral head and neck suspicious for fracture of the femoral neck.  Consider CT scan for confirmation.  IMPRESSION:  1.  Suspicious appearance for subcapital hip fracture on the right - consider CT scan for confirmation. 2.  Lower lumbar spondylosis.   Original Report Authenticated By: Dellia Cloud, M.D.    Ct Hip Right Wo Contrast  06/05/2012  *RADIOLOGY REPORT*  Clinical Data: Fall.  Hip pain.  CT OF THE RIGHT HIP WITHOUT CONTRAST  Technique:  Multidetector CT imaging was performed according to the standard protocol. Multiplanar CT image reconstructions were also generated.  Comparison: 06/05/2012  Findings: Subcapital right femoral neck fracture is observed with mild impaction particularly anteriorly, causing apex anterior angulation.  Abnormal bruising/stranding noted in the subcutaneous tissues lateral to the hip.  Sigmoid diverticulosis is observed along with vascular calcifications.  Gluteus minimus atrophy noted.  IMPRESSION: 1.  Subcapital right femoral neck fracture.  2.  Bruising or edema in the subcutaneous tissues lateral to the right greater trochanter.   Original Report Authenticated By: Dellia Cloud, M.D.    Dg Chest Portable 1 View  06/05/2012  *RADIOLOGY REPORT*  Clinical Data: Fall.  Hypertension.  PORTABLE CHEST - 1 VIEW  Comparison: 03/07/2010  Findings: The cardiac silhouette is mildly enlarged.  No mediastinal or hilar mass or adenopathy is noted.  The aorta is tortuous.  The lungs are clear.  No pleural effusion or pneumothorax.  The bony thorax is demineralized but intact.  IMPRESSION: No acute cardiopulmonary disease   Original Report Authenticated By: Domenic Moras, M.D.    Dg Knee Complete 4 Views Right  06/05/2012  *RADIOLOGY REPORT*  Clinical Data:  Fall - right knee pain  RIGHT KNEE - COMPLETE 4+ VIEW  Comparison: None.  Findings: Degenerative loss of medial compartmental articular space noted.  No knee effusion observed.  Distal superficial femoral artery and popliteal artery atherosclerotic calcification noted. Patellofemoral spurring is present.  No fracture identified.  IMPRESSION:  1.  Osteoarthritis. 2.  No acute bony findings are identified. 3.  Atherosclerosis.   Original Report Authenticated By: Dellia Cloud, M.D.     Date: 06/05/2012  Rate: 79  Rhythm: normal sinus rhythm  QRS Axis: normal  Intervals: PR prolonged  ST/T Wave abnormalities: normal  Conduction Disutrbances:right bundle branch block  Narrative Interpretation:   Old EKG Reviewed: unchanged from 12/05/2007    1. Subcapital fracture of hip   2. Fall    Plan admission  Devoria Albe, MD, FACEP    MDM           Ward Givens, MD 06/05/12 2249

## 2012-06-05 NOTE — ED Notes (Signed)
Pt states she was ambulating in kitchen. Upon turning around from getting into the refrigerator, she "got turned around and lost my footing." Pt denies dizziness, LOC or trauma. She was able to crawl to the counter that has the phone and called family who then called EMS.

## 2012-06-05 NOTE — ED Notes (Signed)
JXB:[id number]<GN> Expected date:06/05/12<BR> Expected time: 5:39 PM<BR> Means of arrival:Ambulance<BR> Comments:<BR> Fall on blood thinners

## 2012-06-05 NOTE — ED Notes (Signed)
Patient transported from X-ray 

## 2012-06-05 NOTE — ED Notes (Signed)
Patient transported to X-ray 

## 2012-06-05 NOTE — H&P (Signed)
Chief Complaint:  Fall rt hip pain  HPI: 78 yo highly functioning female that lives at home that was going to scramble some eggs and somehow fell in the kitchen.  Comes in with rt hip pain and found to have rt hip fracture.  Normal state of health prior to this accident.  H/o stent cad about 10 years ago.  Pain well controlled right now with morphine iv given in ED.  Review of Systems:  O/w neg  Past Medical History: Past Medical History  Diagnosis Date  . Hypertension   . Cataract     both eyes  . Heart attack 2001    stents put in  . Arthritis   . Glaucoma   . Poor circulation    Past Surgical History  Procedure Date  . Abdominal hysterectomy 1972  . Eye surgery 2007/2008    cataracts  . Cardiac surgery 2001    stent placement  . Hernia repair 2009  . Gallbladder surgery 2009    Medications: Prior to Admission medications   Medication Sig Start Date End Date Taking? Authorizing Provider  aspirin 81 MG tablet Take 81 mg by mouth daily. And sometimes at night also    Yes Historical Provider, MD  ciprofloxacin (CIPRO) 500 MG tablet Take 500 mg by mouth 2 (two) times daily. 05/20/12  Yes Historical Provider, MD  clopidogrel (PLAVIX) 75 MG tablet Take 75 mg by mouth daily.    Yes Historical Provider, MD  Dorzolamide HCl-Timolol Mal PF 22.3-6.8 MG/ML SOLN Apply 1 drop to eye every 12 (twelve) hours.   Yes Historical Provider, MD  furosemide (LASIX) 20 MG tablet Take 20 mg by mouth daily.    Yes Historical Provider, MD  latanoprost (XALATAN) 0.005 % ophthalmic solution Place 1 drop into both eyes at bedtime.   Yes Historical Provider, MD  metoprolol tartrate (LOPRESSOR) 25 MG tablet Take 25 mg by mouth 2 (two) times daily.   Yes Historical Provider, MD    Allergies:   Allergies  Allergen Reactions  . Codeine Nausea And Vomiting  . Morphine And Related Nausea And Vomiting  . Penicillins Hives    Social History:  reports that she has never smoked. She has never used  smokeless tobacco. She reports that she does not drink alcohol or use illicit drugs.  Family History: Family History  Problem Relation Age of Onset  . Arthritis Father   . Stroke Father   . Hypertension Father   . Heart disease Sister   . Heart disease Brother   . Hypertension Brother   . Stroke Brother     Physical Exam: Filed Vitals:   06/05/12 1755  BP: 187/60  Temp: 97.9 F (36.6 C)  TempSrc: Oral  Resp: 18  SpO2: 94%   General appearance: alert, cooperative and no distress Lungs: clear to auscultation bilaterally Heart: regular rate and rhythm, S1, S2 normal, no murmur, click, rub or gallop Abdomen: soft, non-tender; bowel sounds normal; no masses,  no organomegaly Extremities: extremities normal, atraumatic, no cyanosis or edema  Pain with movement of rt hip Pulses: 2+ and symmetric Skin: Skin color, texture, turgor normal. No rashes or lesions Neurologic: Grossly normal  Labs on Admission:  Reviewed but not in epic yet  Radiological Exams on Admission: Dg Hip Complete Right  06/05/2012  *RADIOLOGY REPORT*  Clinical Data: Fall.  Right hip pain.  RIGHT HIP - COMPLETE 2+ VIEW  Comparison: 10/18/2010  Findings: Fragmented spur noted along the left anterior inferior iliac spine, chronic.  Lower lumbar spondylosis is observed.  The patient is unable to fully externally rotate the right hip, reducing sensitivity in assessment.  On the cross table lateral view it is difficult to determine which side of the right and which sized left, although I suspect that the right hip is more anterior. There is some irregularity the junction the right femoral head and neck suspicious for fracture of the femoral neck.  Consider CT scan for confirmation.  IMPRESSION:  1.  Suspicious appearance for subcapital hip fracture on the right - consider CT scan for confirmation. 2.  Lower lumbar spondylosis.   Original Report Authenticated By: Dellia Cloud, M.D.    Ct Hip Right Wo  Contrast  06/05/2012  *RADIOLOGY REPORT*  Clinical Data: Fall.  Hip pain.  CT OF THE RIGHT HIP WITHOUT CONTRAST  Technique:  Multidetector CT imaging was performed according to the standard protocol. Multiplanar CT image reconstructions were also generated.  Comparison: 06/05/2012  Findings: Subcapital right femoral neck fracture is observed with mild impaction particularly anteriorly, causing apex anterior angulation.  Abnormal bruising/stranding noted in the subcutaneous tissues lateral to the hip.  Sigmoid diverticulosis is observed along with vascular calcifications.  Gluteus minimus atrophy noted.  IMPRESSION: 1.  Subcapital right femoral neck fracture.  2.  Bruising or edema in the subcutaneous tissues lateral to the right greater trochanter.   Original Report Authenticated By: Dellia Cloud, M.D.    Dg Chest Portable 1 View  06/05/2012  *RADIOLOGY REPORT*  Clinical Data: Fall.  Hypertension.  PORTABLE CHEST - 1 VIEW  Comparison: 03/07/2010  Findings: The cardiac silhouette is mildly enlarged.  No mediastinal or hilar mass or adenopathy is noted.  The aorta is tortuous.  The lungs are clear.  No pleural effusion or pneumothorax.  The bony thorax is demineralized but intact.  IMPRESSION: No acute cardiopulmonary disease   Original Report Authenticated By: Domenic Moras, M.D.    Dg Knee Complete 4 Views Right  06/05/2012  *RADIOLOGY REPORT*  Clinical Data: Fall - right knee pain  RIGHT KNEE - COMPLETE 4+ VIEW  Comparison: None.  Findings: Degenerative loss of medial compartmental articular space noted.  No knee effusion observed.  Distal superficial femoral artery and popliteal artery atherosclerotic calcification noted. Patellofemoral spurring is present.  No fracture identified.  IMPRESSION:  1.  Osteoarthritis. 2.  No acute bony findings are identified. 3.  Atherosclerosis.   Original Report Authenticated By: Dellia Cloud, M.D.     Assessment/Plan Present on Admission:  78 yo  female mechanical fall and rt hip fracture .Hip fracture .CAD (coronary artery disease) .Platelet inhibition due to Plavix .Hypertension  Admit pt to tele bed.  Morphine prn pain.  Labs not available on epic due to computer issue but prelim with hgb of around 12.  Cr normal.  ekg rbbb old and no change.  Ortho called and will be seeing.  Hold plavix/asa.  Full code.  Will need to review final labs prior to surgery.   Cynthia Cogle [phone] 06/05/2012, 9:50 PM

## 2012-06-05 NOTE — ED Notes (Signed)
MD at bedside. 

## 2012-06-06 ENCOUNTER — Encounter (HOSPITAL_COMMUNITY): Payer: Self-pay | Admitting: Registered Nurse

## 2012-06-06 ENCOUNTER — Inpatient Hospital Stay (HOSPITAL_COMMUNITY): Payer: Medicare Other

## 2012-06-06 ENCOUNTER — Encounter (HOSPITAL_COMMUNITY)
Admission: EM | Disposition: A | Discharge status: Skilled Nursing Facility | Payer: Self-pay | Source: Home / Self Care | Attending: Internal Medicine

## 2012-06-06 ENCOUNTER — Inpatient Hospital Stay (HOSPITAL_COMMUNITY): Payer: Medicare Other | Admitting: Registered Nurse

## 2012-06-06 DIAGNOSIS — N390 Urinary tract infection, site not specified: Secondary | ICD-10-CM | POA: Diagnosis present

## 2012-06-06 DIAGNOSIS — H409 Unspecified glaucoma: Secondary | ICD-10-CM | POA: Diagnosis present

## 2012-06-06 HISTORY — PX: HIP ARTHROPLASTY: SHX981

## 2012-06-06 LAB — BASIC METABOLIC PANEL
Calcium: 9 mg/dL (ref 8.4–10.5)
GFR calc Af Amer: 56 mL/min — ABNORMAL LOW (ref >90)
GFR calc non Af Amer: 49 mL/min — ABNORMAL LOW (ref >90)
Potassium: 5 mEq/L (ref 3.5–5.1)
Sodium: 135 mEq/L (ref 135–145)

## 2012-06-06 LAB — CBC
Hemoglobin: 11.7 g/dL — ABNORMAL LOW (ref 12.0–15.0)
MCH: 31.2 pg (ref 26.0–34.0)
MCHC: 32.3 g/dL (ref 30.0–36.0)
Platelets: 235 10*3/uL (ref 150–400)
RBC: 3.75 MIL/uL — ABNORMAL LOW (ref 3.87–5.11)

## 2012-06-06 LAB — PROTIME-INR
INR: 1.08 (ref 0.00–1.49)
Prothrombin Time: 13.9 seconds (ref 11.6–15.2)

## 2012-06-06 LAB — SURGICAL PCR SCREEN: MRSA, PCR: NEGATIVE

## 2012-06-06 SURGERY — HEMIARTHROPLASTY, HIP, DIRECT ANTERIOR APPROACH, FOR FRACTURE
Anesthesia: General | Site: Hip | Laterality: Right | Wound class: Clean

## 2012-06-06 MED ORDER — DOCUSATE SODIUM 100 MG PO CAPS
100.0000 mg | ORAL_CAPSULE | Freq: Two times a day (BID) | ORAL | Status: DC
  Administered 2012-06-06 – 2012-06-08 (×4): 100 mg via ORAL
  Filled 2012-06-06 (×5): qty 1

## 2012-06-06 MED ORDER — KCL IN DEXTROSE-NACL 20-5-0.45 MEQ/L-%-% IV SOLN
INTRAVENOUS | Status: DC
  Administered 2012-06-06: 13:00:00 via INTRAVENOUS
  Filled 2012-06-06 (×3): qty 1000

## 2012-06-06 MED ORDER — ACETAMINOPHEN 325 MG PO TABS: 650.0000 mg | ORAL_TABLET | Freq: Four times a day (QID) | ORAL | Status: DC | PRN

## 2012-06-06 MED ORDER — CLOPIDOGREL BISULFATE 75 MG PO TABS
75.0000 mg | ORAL_TABLET | Freq: Every day | ORAL | Status: DC
  Administered 2012-06-07 – 2012-06-08 (×2): 75 mg via ORAL
  Filled 2012-06-06 (×3): qty 1

## 2012-06-06 MED ORDER — LIDOCAINE HCL (CARDIAC) 20 MG/ML IV SOLN
INTRAVENOUS | Status: DC | PRN
  Administered 2012-06-06: 50 mg via INTRAVENOUS

## 2012-06-06 MED ORDER — MENTHOL 3 MG MT LOZG: 1.0000 | LOZENGE | OROMUCOSAL | Status: DC | PRN

## 2012-06-06 MED ORDER — PROPOFOL 10 MG/ML IV BOLUS
INTRAVENOUS | Status: DC | PRN
  Administered 2012-06-06: 60 mg via INTRAVENOUS

## 2012-06-06 MED ORDER — CEFAZOLIN SODIUM-DEXTROSE 2-3 GM-% IV SOLR
2.0000 g | Freq: Once | INTRAVENOUS | Status: AC
  Administered 2012-06-06: 2 g via INTRAVENOUS

## 2012-06-06 MED ORDER — ONDANSETRON HCL 4 MG PO TABS: 4.0000 mg | ORAL_TABLET | Freq: Four times a day (QID) | ORAL | Status: DC | PRN

## 2012-06-06 MED ORDER — HYDROCODONE-ACETAMINOPHEN 5-325 MG PO TABS
1.0000 | ORAL_TABLET | Freq: Four times a day (QID) | ORAL | Status: DC | PRN
  Administered 2012-06-07 – 2012-06-08 (×2): 1 via ORAL
  Filled 2012-06-06 (×3): qty 1

## 2012-06-06 MED ORDER — BUPIVACAINE HCL (PF) 0.5 % IJ SOLN
INTRAMUSCULAR | Status: AC
  Filled 2012-06-06: qty 30

## 2012-06-06 MED ORDER — 0.9 % SODIUM CHLORIDE (POUR BTL) OPTIME
TOPICAL | Status: DC | PRN
  Administered 2012-06-06: 1000 mL

## 2012-06-06 MED ORDER — ASPIRIN EC 325 MG PO TBEC
325.0000 mg | DELAYED_RELEASE_TABLET | Freq: Every day | ORAL | Status: DC
  Administered 2012-06-07 – 2012-06-08 (×2): 325 mg via ORAL
  Filled 2012-06-06 (×3): qty 1

## 2012-06-06 MED ORDER — ALUM & MAG HYDROXIDE-SIMETH 200-200-20 MG/5ML PO SUSP: 30.0000 mL | ORAL | Status: DC | PRN

## 2012-06-06 MED ORDER — EPHEDRINE SULFATE 50 MG/ML IJ SOLN
INTRAMUSCULAR | Status: DC | PRN
  Administered 2012-06-06: 10 mg via INTRAVENOUS

## 2012-06-06 MED ORDER — LACTATED RINGERS IV SOLN: INTRAVENOUS | Status: DC

## 2012-06-06 MED ORDER — SUCCINYLCHOLINE CHLORIDE 20 MG/ML IJ SOLN
INTRAMUSCULAR | Status: DC | PRN
  Administered 2012-06-06: 100 mg via INTRAVENOUS

## 2012-06-06 MED ORDER — FLEET ENEMA 7-19 GM/118ML RE ENEM: 1.0000 | ENEMA | Freq: Once | RECTAL | Status: AC | PRN

## 2012-06-06 MED ORDER — PHENOL 1.4 % MT LIQD: 1.0000 | OROMUCOSAL | Status: DC | PRN

## 2012-06-06 MED ORDER — ONDANSETRON HCL 4 MG/2ML IJ SOLN
INTRAMUSCULAR | Status: DC | PRN
  Administered 2012-06-06: 4 mg via INTRAVENOUS

## 2012-06-06 MED ORDER — MORPHINE SULFATE 2 MG/ML IJ SOLN: 0.5000 mg | INTRAMUSCULAR | Status: DC | PRN

## 2012-06-06 MED ORDER — FENTANYL CITRATE 0.05 MG/ML IJ SOLN
INTRAMUSCULAR | Status: DC | PRN
  Administered 2012-06-06: 100 ug via INTRAVENOUS
  Administered 2012-06-06: 50 ug via INTRAVENOUS

## 2012-06-06 MED ORDER — HYDROMORPHONE HCL PF 1 MG/ML IJ SOLN
0.2500 mg | INTRAMUSCULAR | Status: DC | PRN
  Administered 2012-06-06: 0.5 mg via INTRAVENOUS

## 2012-06-06 MED ORDER — BUPIVACAINE HCL 0.5 % IJ SOLN
INTRAMUSCULAR | Status: DC | PRN
  Administered 2012-06-06: 10 mL

## 2012-06-06 MED ORDER — CEFAZOLIN SODIUM-DEXTROSE 2-3 GM-% IV SOLR
INTRAVENOUS | Status: AC
  Filled 2012-06-06: qty 50

## 2012-06-06 MED ORDER — HYDROMORPHONE HCL PF 1 MG/ML IJ SOLN
INTRAMUSCULAR | Status: AC
  Filled 2012-06-06: qty 1

## 2012-06-06 MED ORDER — KCL IN DEXTROSE-NACL 20-5-0.45 MEQ/L-%-% IV SOLN
INTRAVENOUS | Status: AC
  Filled 2012-06-06: qty 1000

## 2012-06-06 MED ORDER — ONDANSETRON HCL 4 MG/2ML IJ SOLN: 4.0000 mg | Freq: Four times a day (QID) | INTRAMUSCULAR | Status: DC | PRN

## 2012-06-06 MED ORDER — LACTATED RINGERS IV SOLN
INTRAVENOUS | Status: DC | PRN
  Administered 2012-06-06: 09:00:00 via INTRAVENOUS

## 2012-06-06 MED ORDER — BISACODYL 10 MG RE SUPP: 10.0000 mg | Freq: Every day | RECTAL | Status: DC | PRN

## 2012-06-06 MED ORDER — METOCLOPRAMIDE HCL 5 MG/ML IJ SOLN: 5.0000 mg | Freq: Three times a day (TID) | INTRAMUSCULAR | Status: DC | PRN

## 2012-06-06 MED ORDER — ACETAMINOPHEN 650 MG RE SUPP: 650.0000 mg | Freq: Four times a day (QID) | RECTAL | Status: DC | PRN

## 2012-06-06 SURGICAL SUPPLY — 48 items
BLADE HEX COATED 2.75 (ELECTRODE) ×3 IMPLANT
BLADE SAW SAG 73X25 THK (BLADE) ×1
BLADE SAW SGTL 73X25 THK (BLADE) ×2 IMPLANT
BLADE SURG ROTATE 9660 (MISCELLANEOUS) ×3 IMPLANT
CLOTH BEACON ORANGE TIMEOUT ST (SAFETY) ×3 IMPLANT
DRAPE INCISE IOBAN 66X45 STRL (DRAPES) ×3 IMPLANT
DRAPE ORTHO SPLIT 77X108 STRL (DRAPES) ×6
DRAPE POUCH INSTRU U-SHP 10X18 (DRAPES) ×3 IMPLANT
DRAPE SURG ORHT 6 SPLT 77X108 (DRAPES) ×4 IMPLANT
DRAPE U-SHAPE 47X51 STRL (DRAPES) ×3 IMPLANT
DRSG PAD ABDOMINAL 8X10 ST (GAUZE/BANDAGES/DRESSINGS) ×4 IMPLANT
DURAPREP 26ML APPLICATOR (WOUND CARE) ×3 IMPLANT
ELECT BLADE TIP CTD 4 INCH (ELECTRODE) ×3 IMPLANT
ELECT REM PT RETURN 9FT ADLT (ELECTROSURGICAL) ×3
ELECTRODE REM PT RTRN 9FT ADLT (ELECTROSURGICAL) ×2 IMPLANT
EVACUATOR 1/8 PVC DRAIN (DRAIN) ×3 IMPLANT
FACESHIELD LNG OPTICON STERILE (SAFETY) ×9 IMPLANT
GAUZE XEROFORM 1X8 LF (GAUZE/BANDAGES/DRESSINGS) ×2 IMPLANT
GAUZE XEROFORM 5X9 LF (GAUZE/BANDAGES/DRESSINGS) ×3 IMPLANT
GLOVE BIO SURGEON STRL SZ7.5 (GLOVE) ×3 IMPLANT
GLOVE ORTHO TXT STRL SZ7.5 (GLOVE) ×3 IMPLANT
GOWN PREVENTION PLUS LG XLONG (DISPOSABLE) ×3 IMPLANT
IMMOBILIZER KNEE 20 (SOFTGOODS) ×3
IMMOBILIZER KNEE 20 THIGH 36 (SOFTGOODS) ×2 IMPLANT
KIT BASIN OR (CUSTOM PROCEDURE TRAY) ×3 IMPLANT
NDL HYPO 25X1 1.5 SAFETY (NEEDLE) ×1 IMPLANT
NEEDLE HYPO 25X1 1.5 SAFETY (NEEDLE) ×6 IMPLANT
NEEDLE MAYO .5 CIRCLE (NEEDLE) ×3 IMPLANT
NS IRRIG 1000ML POUR BTL (IV SOLUTION) ×6 IMPLANT
PACK TOTAL JOINT (CUSTOM PROCEDURE TRAY) ×3 IMPLANT
PILLOW ABDUCTION HIP (SOFTGOODS) ×3 IMPLANT
POSITIONER SURGICAL ARM (MISCELLANEOUS) ×3 IMPLANT
SPONGE GAUZE 4X4 12PLY (GAUZE/BANDAGES/DRESSINGS) ×3 IMPLANT
SPONGE LAP 18X18 X RAY DECT (DISPOSABLE) ×2 IMPLANT
STAPLER VISISTAT 35W (STAPLE) ×3 IMPLANT
SUCTION FRAZIER 12FR DISP (SUCTIONS) ×3 IMPLANT
SUT ETHIBOND NAB CT1 #1 30IN (SUTURE) ×6 IMPLANT
SUT VIC AB 1 CTX 36 (SUTURE) ×9
SUT VIC AB 1 CTX36XBRD ANBCTR (SUTURE) ×6 IMPLANT
SUT VIC AB 2-0 CT1 27 (SUTURE) ×6
SUT VIC AB 2-0 CT1 TAPERPNT 27 (SUTURE) ×4 IMPLANT
SUT VIC AB 4-0 PS2 27 (SUTURE) ×3 IMPLANT
SYR CONTROL 10ML LL (SYRINGE) ×3 IMPLANT
SYRINGE 10CC LL (SYRINGE) ×2 IMPLANT
TAPE CLOTH SURG 4X10 WHT LF (GAUZE/BANDAGES/DRESSINGS) ×2 IMPLANT
TOWEL OR 17X26 10 PK STRL BLUE (TOWEL DISPOSABLE) ×6 IMPLANT
TRAY FOLEY CATH 14FRSI W/METER (CATHETERS) ×3 IMPLANT
WATER STERILE IRR 1500ML POUR (IV SOLUTION) ×3 IMPLANT

## 2012-06-06 NOTE — Interval H&P Note (Signed)
History and Physical Interval Note:  06/06/2012 10:46 AM  [name] [name]  has presented today for surgery, with the diagnosis of right femoral neck fracture  The various methods of treatment have been discussed with the patient and family. After consideration of risks, benefits and other options for treatment, the patient has consented to  Procedure(s) (LRB) with comments: ARTHROPLASTY BIPOLAR HIP (Right) - monopolar as a surgical intervention .  The patient's history has been reviewed, patient examined, no change in status, stable for surgery.  I have reviewed the patient's chart and labs.  Questions were answered to the patient's satisfaction.     Savier Trickett C

## 2012-06-06 NOTE — Anesthesia Preprocedure Evaluation (Addendum)
Anesthesia Evaluation  Patient identified by MRN, date of birth, ID band Patient awake    Reviewed: Allergy & Precautions, H&P , NPO status , Patient's Chart, lab work & pertinent test results, reviewed documented beta blocker date and time   Airway Mallampati: II TM Distance: >3 FB Neck ROM: full    Dental  (+) Missing and Dental Advisory Given,    Pulmonary neg pulmonary ROS,  breath sounds clear to auscultation  Pulmonary exam normal       Cardiovascular hypertension, On Home Beta Blockers + CAD, + Past MI and + CABG Rhythm:regular Rate:Normal  RBBB   Neuro/Psych negative neurological ROS  negative psych ROS   GI/Hepatic negative GI ROS, Neg liver ROS,   Endo/Other  negative endocrine ROS  Renal/GU negative Renal ROS  negative genitourinary   Musculoskeletal   Abdominal   Peds  Hematology negative hematology ROS (+)   Anesthesia Other Findings   Reproductive/Obstetrics negative OB ROS                          Anesthesia Physical Anesthesia Plan  ASA: III  Anesthesia Plan: General   Post-op Pain Management:    Induction: Intravenous  Airway Management Planned: Oral ETT  Additional Equipment:   Intra-op Plan:   Post-operative Plan: Extubation in OR  Informed Consent: I have reviewed the patients History and Physical, chart, labs and discussed the procedure including the risks, benefits and alternatives for the proposed anesthesia with the patient or authorized representative who has indicated his/her understanding and acceptance.   Dental Advisory Given  Plan Discussed with: CRNA and Surgeon  Anesthesia Plan Comments:         Anesthesia Quick Evaluation

## 2012-06-06 NOTE — Consult Note (Signed)
Reason for Consult:right femoral neck fracture Referring Physician: Lestine Box     MD  [name] [name] is an 78 y.o. female.  HPI: fell in kitchen with eggs in her hands going to scramble some eggs late yesterday afternoon.  Past Medical History  Diagnosis Date  . Hypertension   . Cataract     both eyes  . Heart attack 2001    stents put in  . Arthritis   . Glaucoma   . Poor circulation     Past Surgical History  Procedure Date  . Abdominal hysterectomy 1972  . Eye surgery 2007/2008    cataracts  . Cardiac surgery 2001    stent placement  . Hernia repair 2009  . Gallbladder surgery 2009    Family History  Problem Relation Age of Onset  . Arthritis Father   . Stroke Father   . Hypertension Father   . Heart disease Sister   . Heart disease Brother   . Hypertension Brother   . Stroke Brother     Social History:  reports that she has never smoked. She has never used smokeless tobacco. She reports that she does not drink alcohol or use illicit drugs.  Allergies:  Allergies  Allergen Reactions  . Codeine Nausea And Vomiting  . Morphine And Related Nausea And Vomiting  . Penicillins Hives    Medications: I have reviewed the patient's current medications.  Results for orders placed during the hospital encounter of 06/05/12 (from the past 48 hour(s))  CBC WITH DIFFERENTIAL     Status: Abnormal   Collection Time   06/05/12  7:04 PM      Component Value Range Comment   WBC 12.9 (*) 4.0 - 10.5 K/uL    RBC 4.00  3.87 - 5.11 MIL/uL    Hemoglobin 12.5  12.0 - 15.0 g/dL    HCT 16.1  09.6 - 04.5 %    MCV 94.8  78.0 - 100.0 fL    MCH 31.3  26.0 - 34.0 pg    MCHC 33.0  30.0 - 36.0 g/dL    RDW 40.9  81.1 - 91.4 %    Platelets 272  150 - 400 K/uL    Neutrophils Relative 80 (*) 43 - 77 %    Neutro Abs 10.3 (*) 1.7 - 7.7 K/uL    Lymphocytes Relative 12  12 - 46 %    Lymphs Abs 1.6  0.7 - 4.0 K/uL    Monocytes Relative 6  3 - 12 %    Monocytes Absolute 0.7  0.1 - 1.0 K/uL     Eosinophils Relative 2  0 - 5 %    Eosinophils Absolute 0.2  0.0 - 0.7 K/uL    Basophils Relative 0  0 - 1 %    Basophils Absolute 0.0  0.0 - 0.1 K/uL   COMPREHENSIVE METABOLIC PANEL     Status: Abnormal   Collection Time   06/05/12  7:04 PM      Component Value Range Comment   Sodium 135  135 - 145 mEq/L    Potassium 4.2  3.5 - 5.1 mEq/L    Chloride 98  96 - 112 mEq/L    CO2 28  19 - 32 mEq/L    Glucose, Bld 115 (*) 70 - 99 mg/dL    BUN 24 (*) 6 - 23 mg/dL    Creatinine, Ser 7.82  0.50 - 1.10 mg/dL    Calcium 9.4  8.4 - 95.6  mg/dL    Total Protein 7.7  6.0 - 8.3 g/dL    Albumin 3.6  3.5 - 5.2 g/dL    AST 26  0 - 37 U/L    ALT 14  0 - 35 U/L    Alkaline Phosphatase 76  39 - 117 U/L    Total Bilirubin 0.3  0.3 - 1.2 mg/dL    GFR calc non Af Amer 55 (*) >90 mL/min    GFR calc Af Amer 64 (*) >90 mL/min   APTT     Status: Abnormal   Collection Time   06/05/12  7:04 PM      Component Value Range Comment   aPTT 40 (*) 24 - 37 seconds   PROTIME-INR     Status: Normal   Collection Time   06/05/12  7:04 PM      Component Value Range Comment   Prothrombin Time 13.4  11.6 - 15.2 seconds    INR 1.03  0.00 - 1.49   SAMPLE TO BLOOD BANK     Status: Normal   Collection Time   06/05/12  7:35 PM      Component Value Range Comment   Blood Bank Specimen SAMPLE AVAILABLE FOR TESTING      Sample Expiration 06/08/2012     URINALYSIS, MICROSCOPIC ONLY     Status: Abnormal   Collection Time   06/05/12  7:56 PM      Component Value Range Comment   Color, Urine YELLOW  YELLOW    APPearance CLEAR  CLEAR    Specific Gravity, Urine 1.016  1.005 - 1.030    pH 5.5  5.0 - 8.0    Glucose, UA NEGATIVE  NEGATIVE mg/dL    Hgb urine dipstick TRACE (*) NEGATIVE    Bilirubin Urine NEGATIVE  NEGATIVE    Ketones, ur NEGATIVE  NEGATIVE mg/dL    Protein, ur NEGATIVE  NEGATIVE mg/dL    Urobilinogen, UA 0.2  0.0 - 1.0 mg/dL    Nitrite NEGATIVE  NEGATIVE    Leukocytes, UA NEGATIVE  NEGATIVE    WBC, UA  0-2  <3 WBC/hpf    RBC / HPF 0-2  <3 RBC/hpf    Bacteria, UA FEW (*) RARE    Squamous Epithelial / LPF RARE  RARE    Urine-Other MUCOUS PRESENT     SURGICAL PCR SCREEN     Status: Normal   Collection Time   06/06/12  1:04 AM      Component Value Range Comment   MRSA, PCR NEGATIVE  NEGATIVE    Staphylococcus aureus NEGATIVE  NEGATIVE   BASIC METABOLIC PANEL     Status: Abnormal   Collection Time   06/06/12  5:29 AM      Component Value Range Comment   Sodium 135  135 - 145 mEq/L    Potassium 5.0  3.5 - 5.1 mEq/L    Chloride 100  96 - 112 mEq/L    CO2 29  19 - 32 mEq/L    Glucose, Bld 116 (*) 70 - 99 mg/dL    BUN 20  6 - 23 mg/dL    Creatinine, Ser 6.29  0.50 - 1.10 mg/dL    Calcium 9.0  8.4 - 52.8 mg/dL    GFR calc non Af Amer 49 (*) >90 mL/min    GFR calc Af Amer 56 (*) >90 mL/min   CBC     Status: Abnormal   Collection Time   06/06/12  5:29 AM  Component Value Range Comment   WBC 11.8 (*) 4.0 - 10.5 K/uL    RBC 3.75 (*) 3.87 - 5.11 MIL/uL    Hemoglobin 11.7 (*) 12.0 - 15.0 g/dL    HCT 78.2  95.6 - 21.3 %    MCV 96.5  78.0 - 100.0 fL    MCH 31.2  26.0 - 34.0 pg    MCHC 32.3  30.0 - 36.0 g/dL    RDW 08.6  57.8 - 46.9 %    Platelets 235  150 - 400 K/uL   PROTIME-INR     Status: Normal   Collection Time   06/06/12  5:29 AM      Component Value Range Comment   Prothrombin Time 13.9  11.6 - 15.2 seconds    INR 1.08  0.00 - 1.49   APTT     Status: Normal   Collection Time   06/06/12  5:29 AM      Component Value Range Comment   aPTT 27  24 - 37 seconds     Dg Hip Complete Right  06/05/2012  *RADIOLOGY REPORT*  Clinical Data: Fall.  Right hip pain.  RIGHT HIP - COMPLETE 2+ VIEW  Comparison: 10/18/2010  Findings: Fragmented spur noted along the left anterior inferior iliac spine, chronic.  Lower lumbar spondylosis is observed.  The patient is unable to fully externally rotate the right hip, reducing sensitivity in assessment.  On the cross table lateral view it is  difficult to determine which side of the right and which sized left, although I suspect that the right hip is more anterior. There is some irregularity the junction the right femoral head and neck suspicious for fracture of the femoral neck.  Consider CT scan for confirmation.  IMPRESSION:  1.  Suspicious appearance for subcapital hip fracture on the right - consider CT scan for confirmation. 2.  Lower lumbar spondylosis.   Original Report Authenticated By: Dellia Cloud, M.D.    Ct Hip Right Wo Contrast  06/05/2012  *RADIOLOGY REPORT*  Clinical Data: Fall.  Hip pain.  CT OF THE RIGHT HIP WITHOUT CONTRAST  Technique:  Multidetector CT imaging was performed according to the standard protocol. Multiplanar CT image reconstructions were also generated.  Comparison: 06/05/2012  Findings: Subcapital right femoral neck fracture is observed with mild impaction particularly anteriorly, causing apex anterior angulation.  Abnormal bruising/stranding noted in the subcutaneous tissues lateral to the hip.  Sigmoid diverticulosis is observed along with vascular calcifications.  Gluteus minimus atrophy noted.  IMPRESSION: 1.  Subcapital right femoral neck fracture.  2.  Bruising or edema in the subcutaneous tissues lateral to the right greater trochanter.   Original Report Authenticated By: Dellia Cloud, M.D.    Dg Chest Portable 1 View  06/05/2012  *RADIOLOGY REPORT*  Clinical Data: Fall.  Hypertension.  PORTABLE CHEST - 1 VIEW  Comparison: 03/07/2010  Findings: The cardiac silhouette is mildly enlarged.  No mediastinal or hilar mass or adenopathy is noted.  The aorta is tortuous.  The lungs are clear.  No pleural effusion or pneumothorax.  The bony thorax is demineralized but intact.  IMPRESSION: No acute cardiopulmonary disease   Original Report Authenticated By: Domenic Moras, M.D.    Dg Knee Complete 4 Views Right  06/05/2012  *RADIOLOGY REPORT*  Clinical Data: Fall - right knee pain  RIGHT KNEE -  COMPLETE 4+ VIEW  Comparison: None.  Findings: Degenerative loss of medial compartmental articular space noted.  No knee effusion observed.  Distal superficial femoral artery and popliteal artery atherosclerotic calcification noted. Patellofemoral spurring is present.  No fracture identified.  IMPRESSION:  1.  Osteoarthritis. 2.  No acute bony findings are identified. 3.  Atherosclerosis.   Original Report Authenticated By: Dellia Cloud, M.D.     Review of Systems  Constitutional: Negative.   HENT: Negative.   Eyes: Negative.        Glasses, glaucoma  Cardiovascular:       Stents  2002  HTN  Skin: Negative.   Neurological:       Neg for CVA   Blood pressure 129/69, pulse 67, temperature 98.5 F (36.9 C), temperature source Oral, resp. rate 17, height 5\' 3"  (1.6 m), weight 83.144 kg (183 lb 4.8 oz), SpO2 99.00%. Physical Exam  Constitutional: She appears well-developed.  HENT:  Head: Normocephalic.  Eyes: Pupils are equal, round, and reactive to light.  Neck: Normal range of motion.  Cardiovascular: Normal rate.   Respiratory: Effort normal.  GI: Soft.  Musculoskeletal:       pinful right hip ROM, pulses 2 plus sciatic intact  Skin: Skin is warm and dry.  Psychiatric: She has a normal mood and affect. Her behavior is normal. Judgment and thought content normal.    Assessment/Plan: Right femoral neck fracture, options perc screws vs hemi discussed. .faster rehab and early FWB with hemi. She and daughter agree and wish to proceed. Risks discussed, infection, dislocation , revision.   Lilliah Priego C 06/06/2012, 8:04 AM

## 2012-06-06 NOTE — Brief Op Note (Signed)
06/05/2012 - 06/06/2012  10:47 AM  PATIENT:  [name] [name]  78 y.o. female  PRE-OPERATIVE DIAGNOSIS:  right femoral neck fracture  POST-OPERATIVE DIAGNOSIS:  right femoral neck fracture  PROCEDURE:  Procedure(s) (LRB) with comments: ARTHROPLASTY BIPOLAR HIP (Right) - monopolar   45 ball   Plus zero neck  #4 summit basic stem SURGEON:  Surgeon(s) and Role:    * Eldred Manges, MD - Primary  PHYSICIAN ASSISTANT:   ASSISTANTS: none   ANESTHESIA:   general  EBL:  Total I/O In: 1000 [I.V.:1000] Out: 300 [Urine:200; Blood:100]  BLOOD ADMINISTERED:none  DRAINS: none   LOCAL MEDICATIONS USED:  MARCAINE     SPECIMEN:  No Specimen  DISPOSITION OF SPECIMEN:  N/A  COUNTS:  YES  TOURNIQUET:  * No tourniquets in log *  DICTATION: .Other Dictation: Dictation Number [id number]  PLAN OF CARE: still inpatient  PATIENT DISPOSITION:  PACU - hemodynamically stable.   Delay start of Pharmacological VTE agent (>24hrs) due to surgical blood loss or risk of bleeding: will be on ASA and SCD  CHEST GUIDELINES

## 2012-06-06 NOTE — Op Note (Signed)
NAMEANALUIZA, MCNEARY                   ACCOUNT NO.:  [id number]  MEDICAL RECORD NO.:  [id number]  LOCATION:  1503                         FACILITY:  [facility]  PHYSICIAN:  Sallyann Kinnaird C. Ophelia Charter, M.D.    DATE OF BIRTH:  03/17/1919  DATE OF PROCEDURE:  06/06/2012 DATE OF DISCHARGE:                              OPERATIVE REPORT   PREOPERATIVE DIAGNOSIS:  Right femoral neck fracture.  POSTOPERATIVE DIAGNOSIS:  Right femoral neck fracture.  PROCEDURE:  Right hip hemiarthroplasty.  SURGEON:  Mionna Advincula C. Ophelia Charter, MD  ANESTHESIA:  GOT plus 10 mL Marcaine local.  EBL:  100 mL.  COMPONENTS:  Summit basic #4 stem, +0 neck, 45 mm ball.  BRIEF HISTORY:  A 78 year old female with a fall in the kitchen when she was going to scramble some eggs in late afternoon.  She slipped, fell, suffering femoral neck fracture.  She has had pain.  She is here at the hospital with her daughter's, one has power-of-attorney and informed consent was obtained for planned hip hemiarthroplasty, so she could resume ambulation.  She lives alone, has daughters and children visit her on a daily basis.  PROCEDURE:  After induction of general anesthesia, orotracheal intubation, the patient was placed in lateral position, 2 g of Ancef was given prophylactically.  Standard prepping and draping with DuraPrep, the usual split sheets, drapes, axillary roll, impervious stockinette, Coban, time-out procedure, sterile skin marker and Betadine, Steri- Drapes were all applied.  Posterior approach was made.  Gluteus maximus was split in line with the fibers.  Deep Charnley blades were attached. Piriformis was tagged, cut, repaired back to the gluteus medius at the end of the case.  Capsule was opened.  The intra-articular hematoma was evacuated.  Short external rotators were divided.  Fracture displaced with internal rotation of the hip.  The neck was cut 1 fingerbreadth above the lesser trochanter.  Head was removed with a corkscrew, twisting,  removal, measurement with the sizers using ring sizers and trial which gave a nice suction fit.  Sequential reaming lateralized her cookie cutter and then broaching was performed, #4 gave an excellent tight fit.  Calcar was readjusted slightly so that the cut was appropriate in line with the trial. Permanent stem was inserted based on trials that were placed on the permanent prosthesis.  A +0 neck gave excellent tight fit.  Quad was not tight.  Flexion 90, internal rotation 85 degrees before the hip would began to sublux.  No impingement with abduction and it was extremely tight.  It was difficult to reduce.  After irrigation with saline solution, trial was popped off.  The sciatic nerve was protected again with insertion. Identical findings of stability.  Repair of the piriformis back to gluteus medius with free needle using #1 Ethibond, #1 Vicryl in the tensor fascia, gluteus maximus fascia, 2-0 on the subcutaneous tissue, skin staple closure, postop dressing with 4x4s, ABD, and tape.  Knee immobilizer was applied.  Instrument count and needle count was correct.     Rosaleigh Brazzel C. Ophelia Charter, M.D.     MCY/MEDQ  D:  06/06/2012  T:  06/06/2012  Job:  [id number]

## 2012-06-06 NOTE — Progress Notes (Signed)
[facility] PROGRESS NOTE  [name] [name] ZOX:[id number] DOB: [birth year] DOA: 06/05/2012 PCP: No primary provider on file.  Assessment/Plan: 1-Hip fracture: s/p right hip hemiarthroplasty' post surgical management, wound care, PT/OT and anticoagulation per ortho service.  2-Fall at home: mechanical fall; no hx of syncope, palpitations or precipitating factors. Telemetry has been unremarkable; troponin negative and no signs of infection.   3-CAD (coronary artery disease): will restart ASA and plavix and will continue metoprolol  4-Hypertension: stable. Continue current regimen and low sodium diet.  5-UTI: will finish tx in 2 days. On cipro prior coming into the hospital  6-Glaucoma:continue eye drops.  Code Status: Full Family Communication: daughters and son at bedside Disposition Plan: needs evaluation from PT/OT to determine next venue for rehab. Most likely SNF.   Consultants:  Orthopedics (Dr. Ophelia Charter)  Procedures:  Right hip hemiarthroplasty  Antibiotics:  Ancef and rocephin (prophylaxis) 06/06/12  cipro on a 14 days course for recent UTI  HPI/Subjective: Afebrile; no CP, no SOB, no N/V or abdominal pain. Patient with mild right hip discomfort.  Objective: Filed Vitals:   06/06/12 1145 06/06/12 1203 06/06/12 1224 06/06/12 1324  BP: 117/46 110/56 110/50 97/37  Pulse: 81 84 83 70  Temp:  97.7 F (36.5 C)  98 F (36.7 C)  TempSrc:      Resp: 12 16 15 16   Height:      Weight:      SpO2: 97% 97% 98% 95%    Intake/Output Summary (Last 24 hours) at 06/06/12 1344 Last data filed at 06/06/12 1135  Gross per 24 hour  Intake   1700 ml  Output   1125 ml  Net    575 ml   Filed Weights   06/06/12 0045  Weight: 83.144 kg (183 lb 4.8 oz)    Exam:   General:  NAD; mild pain on her right hip; afebrile. AAOX3  Cardiovascular: S1 and S2, no rubs, murmurs or gallops  Respiratory: CTA  Abdomen: soft, NT, ND, positive BS  Extremities: mild pain on her  right hip, incision with clean dressings in place; trace edema  Neuro:non focal  Data Reviewed: Basic Metabolic Panel:  Lab 06/06/12 8119 06/05/12 1904  NA 135 135  K 5.0 4.2  CL 100 98  CO2 29 28  GLUCOSE 116* 115*  BUN 20 24*  CREATININE 0.99 0.89  CALCIUM 9.0 9.4  MG -- --  PHOS -- --   Liver Function Tests:  Lab 06/05/12 1904  AST 26  ALT 14  ALKPHOS 76  BILITOT 0.3  PROT 7.7  ALBUMIN 3.6   CBC:  Lab 06/06/12 0529 06/05/12 1904  WBC 11.8* 12.9*  NEUTROABS -- 10.3*  HGB 11.7* 12.5  HCT 36.2 37.9  MCV 96.5 94.8  PLT 235 272     Recent Results (from the past 240 hour(s))  SURGICAL PCR SCREEN     Status: Normal   Collection Time   06/06/12  1:04 AM      Component Value Range Status Comment   MRSA, PCR NEGATIVE  NEGATIVE Final    Staphylococcus aureus NEGATIVE  NEGATIVE Final      Studies: Dg Hip Complete Right  06/05/2012  *RADIOLOGY REPORT*  Clinical Data: Fall.  Right hip pain.  RIGHT HIP - COMPLETE 2+ VIEW  Comparison: 10/18/2010  Findings: Fragmented spur noted along the left anterior inferior iliac spine, chronic.  Lower lumbar spondylosis is observed.  The patient is unable to fully externally rotate the right hip, reducing sensitivity  in assessment.  On the cross table lateral view it is difficult to determine which side of the right and which sized left, although I suspect that the right hip is more anterior. There is some irregularity the junction the right femoral head and neck suspicious for fracture of the femoral neck.  Consider CT scan for confirmation.  IMPRESSION:  1.  Suspicious appearance for subcapital hip fracture on the right - consider CT scan for confirmation. 2.  Lower lumbar spondylosis.   Original Report Authenticated By: Dellia Cloud, M.D.    Dg Pelvis Portable  06/06/2012  *RADIOLOGY REPORT*  Clinical Data: Right hip fracture.  PORTABLE PELVIS  Comparison: None.  Findings: A right hip prosthesis is seen in expected position.   No evidence of fracture or dislocation.  Overlying skin staples are noted.  IMPRESSION: Expected postoperative appearance of right hip prosthesis.   Original Report Authenticated By: Danae Orleans, M.D.    Ct Hip Right Wo Contrast  06/05/2012  *RADIOLOGY REPORT*  Clinical Data: Fall.  Hip pain.  CT OF THE RIGHT HIP WITHOUT CONTRAST  Technique:  Multidetector CT imaging was performed according to the standard protocol. Multiplanar CT image reconstructions were also generated.  Comparison: 06/05/2012  Findings: Subcapital right femoral neck fracture is observed with mild impaction particularly anteriorly, causing apex anterior angulation.  Abnormal bruising/stranding noted in the subcutaneous tissues lateral to the hip.  Sigmoid diverticulosis is observed along with vascular calcifications.  Gluteus minimus atrophy noted.  IMPRESSION: 1.  Subcapital right femoral neck fracture.  2.  Bruising or edema in the subcutaneous tissues lateral to the right greater trochanter.   Original Report Authenticated By: Dellia Cloud, M.D.    Dg Chest Portable 1 View  06/05/2012  *RADIOLOGY REPORT*  Clinical Data: Fall.  Hypertension.  PORTABLE CHEST - 1 VIEW  Comparison: 03/07/2010  Findings: The cardiac silhouette is mildly enlarged.  No mediastinal or hilar mass or adenopathy is noted.  The aorta is tortuous.  The lungs are clear.  No pleural effusion or pneumothorax.  The bony thorax is demineralized but intact.  IMPRESSION: No acute cardiopulmonary disease   Original Report Authenticated By: Domenic Moras, M.D.    Dg Knee Complete 4 Views Right  06/05/2012  *RADIOLOGY REPORT*  Clinical Data: Fall - right knee pain  RIGHT KNEE - COMPLETE 4+ VIEW  Comparison: None.  Findings: Degenerative loss of medial compartmental articular space noted.  No knee effusion observed.  Distal superficial femoral artery and popliteal artery atherosclerotic calcification noted. Patellofemoral spurring is present.  No fracture  identified.  IMPRESSION:  1.  Osteoarthritis. 2.  No acute bony findings are identified. 3.  Atherosclerosis.   Original Report Authenticated By: Dellia Cloud, M.D.     Scheduled Meds:   . aspirin EC  325 mg Oral Q breakfast  .  ceFAZolin (ANCEF) IV  2 g Intravenous Once  . cefTRIAXone (ROCEPHIN)  IV  1 g Intravenous Once  . ciprofloxacin  500 mg Oral BID  . clopidogrel  75 mg Oral Daily  . dextrose 5 % and 0.45 % NaCl with KCl 20 mEq/L      . docusate sodium  100 mg Oral BID  . Dorzolamide HCl-Timolol Mal PF  1 drop Ophthalmic Q12H  . furosemide  20 mg Oral Daily  . HYDROmorphone      . latanoprost  1 drop Both Eyes QHS  . metoprolol tartrate  25 mg Oral BID  .  morphine injection  4 mg Intravenous Once  .  morphine injection  4 mg Intravenous Once  . ondansetron (ZOFRAN) IV  4 mg Intravenous Once  . sodium chloride  3 mL Intravenous Q12H   Continuous Infusions:   . sodium chloride Stopped (06/06/12 0231)  . dextrose 5 % and 0.45 % NaCl with KCl 20 mEq/L 75 mL/hr at 06/06/12 1258  . DISCONTD: 0.45 % NaCl with KCl 20 mEq / L 100 mL/hr (06/06/12 0230)  . DISCONTD: sodium chloride 100 mL/hr at 06/05/12 2036  . DISCONTD: lactated ringers    . DISCONTD: lactated ringers        Time spent: >25 minutes    Rosi Secrist  [facility] Pager [phone]. If 8PM-8AM, please contact night-coverage at [email or web address], password [facility] 06/06/2012, 1:44 PM  LOS: 1 day

## 2012-06-06 NOTE — Anesthesia Postprocedure Evaluation (Signed)
  Anesthesia Post-op Note  Patient: [name] [name]  Procedure(s) Performed: Procedure(s) (LRB): ARTHROPLASTY BIPOLAR HIP (Right)  Patient Location: PACU  Anesthesia Type: General  Level of Consciousness: awake and alert   Airway and Oxygen Therapy: Patient Spontanous Breathing  Post-op Pain: mild  Post-op Assessment: Post-op Vital signs reviewed, Patient's Cardiovascular Status Stable, Respiratory Function Stable, Patent Airway and No signs of Nausea or vomiting  Post-op Vital Signs: stable  Complications: No apparent anesthesia complications

## 2012-06-06 NOTE — Transfer of Care (Signed)
Immediate Anesthesia Transfer of Care Note  Patient: [name] [name]  Procedure(s) Performed: Procedure(s) (LRB) with comments: ARTHROPLASTY BIPOLAR HIP (Right) - monopolar  Patient Location: PACU  Anesthesia Type: General  Level of Consciousness: awake, alert , oriented and patient cooperative  Airway & Oxygen Therapy: Patient Spontanous Breathing and Patient connected to face mask oxygen  Post-op Assessment: Report given to PACU RN and Patient moving all extremities  Post vital signs: Reviewed and stable  Complications: No apparent anesthesia complications

## 2012-06-06 NOTE — Preoperative (Signed)
Beta Blockers   Reason not to administer Beta Blockers:Metroprolol given 06-06-12 0045

## 2012-06-06 NOTE — Progress Notes (Signed)
We receive her from PACU at this time in no distress.  She is drowsy and is eupneic.  Her skin is normal, warm and dry.  Her dressing right hip/illeac crest area is dry and intact.  She is able to move all toes bilat. On command.  Her only c/o at this time is of dry mouth, for which I obtain for her some ice chips, and assist her with them.

## 2012-06-07 DIAGNOSIS — D649 Anemia, unspecified: Secondary | ICD-10-CM | POA: Diagnosis not present

## 2012-06-07 LAB — BASIC METABOLIC PANEL
BUN: 15 mg/dL (ref 6–23)
Chloride: 97 mEq/L (ref 96–112)
Glucose, Bld: 172 mg/dL — ABNORMAL HIGH (ref 70–99)
Potassium: 4.6 mEq/L (ref 3.5–5.1)

## 2012-06-07 LAB — URINE CULTURE: Special Requests: NORMAL

## 2012-06-07 LAB — CBC
HCT: 28.4 % — ABNORMAL LOW (ref 36.0–46.0)
Hemoglobin: 9.4 g/dL — ABNORMAL LOW (ref 12.0–15.0)
MCV: 95.9 fL (ref 78.0–100.0)
WBC: 11.3 10*3/uL — ABNORMAL HIGH (ref 4.0–10.5)

## 2012-06-07 MED ORDER — SODIUM CHLORIDE 0.9 % IV SOLN
INTRAVENOUS | Status: DC
  Administered 2012-06-07: 16:00:00 via INTRAVENOUS

## 2012-06-07 MED ORDER — CIPROFLOXACIN HCL 500 MG PO TABS
500.0000 mg | ORAL_TABLET | Freq: Two times a day (BID) | ORAL | Status: AC
  Administered 2012-06-07 – 2012-06-08 (×2): 500 mg via ORAL
  Filled 2012-06-07 (×2): qty 1

## 2012-06-07 MED ORDER — HYDROCODONE-ACETAMINOPHEN 5-500 MG PO CAPS: 1.0000 | ORAL_CAPSULE | Freq: Four times a day (QID) | ORAL | Status: DC | PRN

## 2012-06-07 MED ORDER — ASPIRIN EC 325 MG PO TBEC: 325.0000 mg | DELAYED_RELEASE_TABLET | Freq: Every day | ORAL | Status: DC

## 2012-06-07 MED ORDER — METOPROLOL TARTRATE 12.5 MG HALF TABLET
12.5000 mg | ORAL_TABLET | Freq: Two times a day (BID) | ORAL | Status: DC
  Administered 2012-06-07 – 2012-06-08 (×2): 12.5 mg via ORAL
  Filled 2012-06-07 (×3): qty 1

## 2012-06-07 MED ORDER — FERROUS FUMARATE 325 (106 FE) MG PO TABS
1.0000 | ORAL_TABLET | Freq: Two times a day (BID) | ORAL | Status: DC
  Administered 2012-06-07 – 2012-06-08 (×3): 106 mg via ORAL
  Filled 2012-06-07 (×4): qty 1

## 2012-06-07 NOTE — Progress Notes (Signed)
[facility] PROGRESS NOTE  [name] [name] WUJ:[id number] DOB: Aug 21, 1919 DOA: 06/05/2012 PCP: No primary provider on file.  Assessment/Plan: 1-Hip fracture: s/p right hip hemiarthroplasty' post surgical management, wound care, PT/OT and anticoagulation per ortho service.  2-Fall at home: mechanical fall; no hx of syncope, palpitations or precipitating factors. Telemetry has been unremarkable; troponin negative and no signs of acute infection.   3-CAD (coronary artery disease): will continue ASA, plavix and metoprolol  4-Hypertension: stable/soft. Continue metoprolol but will reduce dose to 1/2  and continue low sodium diet.  5-UTI: will finish tx with 2 more doses. On cipro prior coming into the hospital  6-Glaucoma:continue eye drops.  7-Post operative anemia: Hgb 9.1 today; patient asymptomatic. Will start ferrous sulfate and track Hgb level if less than 8.5 will transfuse 2 units.  Code Status: Full Family Communication: daughters and son at bedside Disposition Plan: needs evaluation from PT/OT to determine next venue for rehab. Most likely SNF.   Consultants:  Orthopedics (Dr. Ophelia Charter)  Procedures:  Right hip hemiarthroplasty  Antibiotics:  Ancef and rocephin (prophylaxis) 06/06/12  cipro on a 14 days course for recent UTI (2 more doses pending)  HPI/Subjective: Afebrile; no CP, no SOB, no N/V or abdominal pain. Patient with mild right hip discomfort. Good participation with PT today.  Objective: Filed Vitals:   06/06/12 1600 06/06/12 2200 06/07/12 0200 06/07/12 0600  BP:  115/69 86/51 103/60  Pulse:  79 75 76  Temp:  98.2 F (36.8 C) 98.2 F (36.8 C) 98 F (36.7 C)  TempSrc:  Oral Oral Oral  Resp: 16 16 16 16   Height:      Weight:      SpO2: 94% 99% 94% 98%    Intake/Output Summary (Last 24 hours) at 06/07/12 1050 Last data filed at 06/07/12 0900  Gross per 24 hour  Intake    760 ml  Output    975 ml  Net   -215 ml   Filed Weights   06/06/12  0045  Weight: 83.144 kg (183 lb 4.8 oz)    Exam:   General:  NAD; mild pain on her right hip; afebrile. AAOX3  Cardiovascular: S1 and S2, no rubs, murmurs or gallops  Respiratory: CTA  Abdomen: soft, NT, ND, positive BS  Extremities: mild pain on her right hip, incision with clean dressings in place; trace edema  Neuro: non focal deficit  Data Reviewed: Basic Metabolic Panel:  Lab 06/07/12 9562 06/06/12 0529 06/05/12 1904  NA 131* 135 135  K 4.6 5.0 4.2  CL 97 100 98  CO2 28 29 28   GLUCOSE 172* 116* 115*  BUN 15 20 24*  CREATININE 1.03 0.99 0.89  CALCIUM 8.5 9.0 9.4  MG -- -- --  PHOS -- -- --   Liver Function Tests:  Lab 06/05/12 1904  AST 26  ALT 14  ALKPHOS 76  BILITOT 0.3  PROT 7.7  ALBUMIN 3.6   CBC:  Lab 06/07/12 0444 06/06/12 0529 06/05/12 1904  WBC 11.3* 11.8* 12.9*  NEUTROABS -- -- 10.3*  HGB 9.4* 11.7* 12.5  HCT 28.4* 36.2 37.9  MCV 95.9 96.5 94.8  PLT 185 235 272     Recent Results (from the past 240 hour(s))  SURGICAL PCR SCREEN     Status: Normal   Collection Time   06/06/12  1:04 AM      Component Value Range Status Comment   MRSA, PCR NEGATIVE  NEGATIVE Final    Staphylococcus aureus NEGATIVE  NEGATIVE Final  Studies: Dg Hip Complete Right  06/05/2012  *RADIOLOGY REPORT*  Clinical Data: Fall.  Right hip pain.  RIGHT HIP - COMPLETE 2+ VIEW  Comparison: 10/18/2010  Findings: Fragmented spur noted along the left anterior inferior iliac spine, chronic.  Lower lumbar spondylosis is observed.  The patient is unable to fully externally rotate the right hip, reducing sensitivity in assessment.  On the cross table lateral view it is difficult to determine which side of the right and which sized left, although I suspect that the right hip is more anterior. There is some irregularity the junction the right femoral head and neck suspicious for fracture of the femoral neck.  Consider CT scan for confirmation.  IMPRESSION:  1.  Suspicious  appearance for subcapital hip fracture on the right - consider CT scan for confirmation. 2.  Lower lumbar spondylosis.   Original Report Authenticated By: Dellia Cloud, M.D.    Dg Pelvis Portable  06/06/2012  *RADIOLOGY REPORT*  Clinical Data: Right hip fracture.  PORTABLE PELVIS  Comparison: None.  Findings: A right hip prosthesis is seen in expected position.  No evidence of fracture or dislocation.  Overlying skin staples are noted.  IMPRESSION: Expected postoperative appearance of right hip prosthesis.   Original Report Authenticated By: Danae Orleans, M.D.    Ct Hip Right Wo Contrast  06/05/2012  *RADIOLOGY REPORT*  Clinical Data: Fall.  Hip pain.  CT OF THE RIGHT HIP WITHOUT CONTRAST  Technique:  Multidetector CT imaging was performed according to the standard protocol. Multiplanar CT image reconstructions were also generated.  Comparison: 06/05/2012  Findings: Subcapital right femoral neck fracture is observed with mild impaction particularly anteriorly, causing apex anterior angulation.  Abnormal bruising/stranding noted in the subcutaneous tissues lateral to the hip.  Sigmoid diverticulosis is observed along with vascular calcifications.  Gluteus minimus atrophy noted.  IMPRESSION: 1.  Subcapital right femoral neck fracture.  2.  Bruising or edema in the subcutaneous tissues lateral to the right greater trochanter.   Original Report Authenticated By: Dellia Cloud, M.D.    Dg Chest Portable 1 View  06/05/2012  *RADIOLOGY REPORT*  Clinical Data: Fall.  Hypertension.  PORTABLE CHEST - 1 VIEW  Comparison: 03/07/2010  Findings: The cardiac silhouette is mildly enlarged.  No mediastinal or hilar mass or adenopathy is noted.  The aorta is tortuous.  The lungs are clear.  No pleural effusion or pneumothorax.  The bony thorax is demineralized but intact.  IMPRESSION: No acute cardiopulmonary disease   Original Report Authenticated By: Domenic Moras, M.D.    Dg Knee Complete 4 Views  Right  06/05/2012  *RADIOLOGY REPORT*  Clinical Data: Fall - right knee pain  RIGHT KNEE - COMPLETE 4+ VIEW  Comparison: None.  Findings: Degenerative loss of medial compartmental articular space noted.  No knee effusion observed.  Distal superficial femoral artery and popliteal artery atherosclerotic calcification noted. Patellofemoral spurring is present.  No fracture identified.  IMPRESSION:  1.  Osteoarthritis. 2.  No acute bony findings are identified. 3.  Atherosclerosis.   Original Report Authenticated By: Dellia Cloud, M.D.     Scheduled Meds:    . aspirin EC  325 mg Oral Q breakfast  . ciprofloxacin  500 mg Oral BID  . clopidogrel  75 mg Oral Daily  . dextrose 5 % and 0.45 % NaCl with KCl 20 mEq/L      . docusate sodium  100 mg Oral BID  . Dorzolamide HCl-Timolol Mal PF  1 drop  Ophthalmic Q12H  . furosemide  20 mg Oral Daily  . HYDROmorphone      . latanoprost  1 drop Both Eyes QHS  . metoprolol tartrate  12.5 mg Oral BID  . sodium chloride  3 mL Intravenous Q12H  . DISCONTD: ciprofloxacin  500 mg Oral BID  . DISCONTD: metoprolol tartrate  25 mg Oral BID   Continuous Infusions:    . sodium chloride Stopped (06/06/12 0231)  . sodium chloride    . DISCONTD: 0.45 % NaCl with KCl 20 mEq / L 100 mL/hr (06/06/12 0230)  . DISCONTD: dextrose 5 % and 0.45 % NaCl with KCl 20 mEq/L 75 mL/hr at 06/06/12 1258  . DISCONTD: lactated ringers    . DISCONTD: lactated ringers        Time spent: >30 minutes    Maxx Pham  [facility] Pager [phone]. If 8PM-8AM, please contact night-coverage at [email or web address], password [facility] 06/07/2012, 10:50 AM  LOS: 2 days

## 2012-06-07 NOTE — Clinical Social Work Psychosocial (Unsigned)
     Clinical Social Work Department BRIEF PSYCHOSOCIAL ASSESSMENT 06/07/2012  Patient:  [name] [name], [name] [name]     Account Number:  [id number]     Admit date:  06/05/2012  Clinical Social Worker:  Hattie Perch  Date/Time:  06/07/2012 12:00 M  Referred by:  Physician  Date Referred:  06/07/2012 Referred for  SNF Placement   Other Referral:   Interview type:  Patient Other interview type:   daughter at bedside    PSYCHOSOCIAL DATA Living Status:  ALONE Admitted from facility:   Level of care:   Primary support name:  [name] [name] Primary support relationship to patient:  CHILD, ADULT Degree of support available:   good    CURRENT CONCERNS Current Concerns  Post-Acute Placement   Other Concerns:    SOCIAL WORK ASSESSMENT / PLAN CSW met with patient and patients daughter at bedside. patient is alert and oriented X3. patient in need of snf placement. patient is agreeable to being faxed out and recieving bed offers.   Assessment/plan status:   Other assessment/ plan:   Information/referral to community resources:    PATIENTS/FAMILYS RESPONSE TO PLAN OF CARE: agreeable to being faxed out and recieving bed offers. CSW to follow.

## 2012-06-07 NOTE — Evaluation (Signed)
Occupational Therapy Evaluation Patient Details Name: [name] [name] MRN: [id number] DOB: 1918-11-17 Today's Date: 06/07/2012 Time: 5284-1324 OT Time Calculation (min): 30 min  OT Assessment / Plan / Recommendation Clinical Impression  Pt is s/p fall and underwent R hip hemiarthroplasty. She displays increased pain, decreased strength and independence with ADL. Will benefit from skilled OT services to improve independence wtih these self care tasks.     OT Assessment  Patient needs continued OT Services    Follow Up Recommendations  Skilled nursing facility    Barriers to Discharge      Equipment Recommendations  Rolling walker with 5" wheels;3 in 1 bedside comode    Recommendations for Other Services    Frequency  Min 2X/week    Precautions / Restrictions Precautions Precautions: Posterior Hip;Fall Precaution Booklet Issued: Yes (comment) Precaution Comments: Issued handout and posted in room. Also informed NT that pt has hip precautions. Pt will require lift for back to bed-informed NT about limiting trunk/hip flexion in sling.  Restrictions Weight Bearing Restrictions: No RLE Weight Bearing: Weight bearing as tolerated        ADL  Eating/Feeding: Simulated;Independent Where Assessed - Eating/Feeding: Chair Grooming: Simulated;Wash/dry hands;Set up Where Assessed - Grooming: Supported sitting Upper Body Bathing: Simulated;Chest;Right arm;Left arm;Abdomen;Moderate assistance Where Assessed - Upper Body Bathing: Unsupported sitting;Other (comment) (leans to the L in sitting.) Lower Body Bathing: +2 Total assistance Lower Body Bathing: Patient Percentage: 20% Where Assessed - Lower Body Bathing: Supported sit to stand Upper Body Dressing: Simulated;Moderate assistance;Other (comment) (with gown) Where Assessed - Upper Body Dressing: Unsupported sitting Lower Body Dressing: Simulated;+2 Total assistance Lower Body Dressing: Patient Percentage: 0% Where Assessed - Lower  Body Dressing: Supported sit to stand Toilet Transfer: Simulated;+2 Total assistance Toilet Transfer: Patient Percentage: 40% Toilet Transfer Method: Stand pivot;Other (comment) (to recliner) Toileting - Clothing Manipulation and Hygiene: Simulated;+2 Total assistance Toileting - Clothing Manipulation and Hygiene: Patient Percentage: 0% Where Assessed - Toileting Clothing Manipulation and Hygiene: Sit to stand from 3-in-1 or toilet Tub/Shower Transfer Method: Not assessed Equipment Used: Rolling walker ADL Comments: Reviewed THPs with pt and sign posted in room. Pt tends to lean to the L in sitting but better as she sat EOB. Will benefit from AE education but didnt start this session.    OT Diagnosis: Generalized weakness  OT Problem List: Decreased strength;Decreased activity tolerance;Decreased knowledge of use of DME or AE;Pain;Decreased knowledge of precautions OT Treatment Interventions: Self-care/ADL training;Therapeutic activities;DME and/or AE instruction;Patient/family education   OT Goals Acute Rehab OT Goals OT Goal Formulation: With patient Time For Goal Achievement: 06/21/12 Potential to Achieve Goals: Good ADL Goals Pt Will Perform Grooming: with supervision;Unsupported;Sitting, edge of bed;Sitting, chair ADL Goal: Grooming - Progress: Goal set today Pt Will Perform Upper Body Bathing: with supervision;Sitting, edge of bed;Sitting, chair;Unsupported ADL Goal: Upper Body Bathing - Progress: Goal set today Pt Will Perform Lower Body Bathing: with 2+ total assist;Other (comment);Sit to stand from chair;Sit to stand from bed;with adaptive equipment (pt 50%) ADL Goal: Lower Body Bathing - Progress: Goal set today Pt Will Perform Upper Body Dressing: with supervision;Unsupported;Sitting, bed;Sitting, chair ADL Goal: Upper Body Dressing - Progress: Goal set today Pt Will Transfer to Toilet: with 2+ total assist;with DME;Stand pivot transfer;Other (comment) (pt 75%) ADL Goal:  Toilet Transfer - Progress: Goal set today Pt Will Perform Toileting - Clothing Manipulation: with 2+ total assist;Other (comment) (pt 75%) ADL Goal: Toileting - Clothing Manipulation - Progress: Goal set today Additional ADL Goal #1: Pt will state  3/3 hip precautions for R hip independently.  ADL Goal: Additional Goal #1 - Progress: Goal set today  Visit Information  Last OT Received On: 06/07/12 Assistance Needed: +2    Subjective Data  Subjective: I am on the bedpan Patient Stated Goal: agreeable to work with PT/OT. She acknowledges need to get OOB   Prior Functioning     Home Living Lives With: Alone Available Help at Discharge: Family Type of Home: House Additional Comments: Pt will need ST rehab Prior Function Level of Independence: Independent Communication Communication: No difficulties         Vision/Perception     Cognition  Overall Cognitive Status: Appears within functional limits for tasks assessed/performed Arousal/Alertness: Awake/alert Orientation Level: Appears intact for tasks assessed Behavior During Session: [facility] for tasks performed    Extremity/Trunk Assessment Right Upper Extremity Assessment RUE ROM/Strength/Tone: [facility] for tasks assessed Left Upper Extremity Assessment LUE ROM/Strength/Tone: [facility] for tasks assessed Right Lower Extremity Assessment RLE ROM/Strength/Tone: Deficits;Unable to fully assess;Due to pain RLE ROM/Strength/Tone Deficits: Some difficulty with hip flexion/advancing R LE during mobility.  Left Lower Extremity Assessment LLE ROM/Strength/Tone: Deficits;Unable to fully assess LLE ROM/Strength/Tone Deficits: Strength at least 3+/5 with functional activity     Mobility Bed Mobility Bed Mobility: Supine to Sit Supine to Sit: 1: +2 Total assist;HOB elevated;With rails Supine to Sit: Patient Percentage: 20% Details for Bed Mobility Assistance: Multimodal cues for safety, technique, hand placement. Assist for trunk to upright  and bil LEs off bed. Increased time. Utilized bedpad for scooting, positioning Transfers Transfers: Sit to Stand;Stand to Sit Sit to Stand: 1: +2 Total assist;From bed;From elevated surface;With upper extremity assist;With armrests Sit to Stand: Patient Percentage: 40% Stand to Sit: 1: +2 Total assist;To chair/3-in-1;With upper extremity assist;With armrests Stand to Sit: Patient Percentage: 40% Details for Transfer Assistance: x 2. Multimodal cues for safety, technqiue, hand placement. Assist to rise, stabilize, control descent, maneuver with RW. Pt completed ~75% of pivot before fatiguing-recliner brought up behind pt for sitting.      Shoulder Instructions     Exercise     Balance Balance Balance Assessed: Yes Static Sitting Balance Static Sitting - Balance Support: Bilateral upper extremity supported;Feet supported Static Sitting - Level of Assistance: 4: Min assist Static Sitting - Comment/# of Minutes: Assist for trunk to full upright-pt leaning to L side. Sat EOB 2-3 minutes.    End of Session OT - End of Session Equipment Utilized During Treatment: Gait belt Activity Tolerance: Patient limited by fatigue;Patient limited by pain Patient left: in chair;with call bell/phone within reach  [name] [name]     Lennox Laity [phone] 06/07/2012, 12:13 PM

## 2012-06-07 NOTE — Evaluation (Signed)
Physical Therapy Evaluation Patient Details Name: [name] [name] MRN: [id number] DOB: 08/20/1919 Today's Date: 06/07/2012 Time: 9147-8295 PT Time Calculation (min): 30 min  PT Assessment / Plan / Recommendation Clinical Impression  78 yo female s/p R hip hemiarthroplasty 06/06/12. Increased time and effort for all mobility. On eval pt required +2 assist. Demonstrates weakness, decreased activity tolerance. Recommend ST rehab at SNF.     PT Assessment  Patient needs continued PT services    Follow Up Recommendations  Post acute inpatient    Does the patient have the potential to tolerate intense rehabilitation   No, Recommend SNF  Barriers to Discharge        Equipment Recommendations  Rolling walker with 5" wheels    Recommendations for Other Services OT consult   Frequency Min 3X/week    Precautions / Restrictions Precautions Precautions: Posterior Hip;Fall Precaution Booklet Issued: Yes (comment) Precaution Comments: Issued handout and posted in room. Also informed NT that pt has hip precautions. Pt will require lift for back to bed-informed NT about limiting trunk/hip flexion in sling.  Restrictions Weight Bearing Restrictions: No RLE Weight Bearing: Weight bearing as tolerated   Pertinent Vitals/Pain 8/10 R hip with weightbearing      Mobility  Bed Mobility Bed Mobility: Supine to Sit Supine to Sit: 1: +2 Total assist;HOB elevated;With rails Supine to Sit: Patient Percentage: 20% Details for Bed Mobility Assistance: Multimodal cues for safety, technique, hand placement. Assist for trunk to upright and bil LEs off bed. Increased time. Utilized bedpad for scooting, positioning Transfers Transfers: Sit to Stand;Stand to [employer] Transfers Sit to Stand: 1: +2 Total assist;From bed;From elevated surface;With upper extremity assist;With armrests Sit to Stand: Patient Percentage: 40% Stand to Sit: 1: +2 Total assist;To chair/3-in-1;With upper extremity assist;With  armrests Stand to Sit: Patient Percentage: 40% Stand Pivot Transfers: 1: +2 Total assist Stand Pivot Transfers: Patient Percentage: 40% Details for Transfer Assistance: x 2. Multimodal cues for safety, technqiue, hand placement. Assist to rise, stabilize, control descent, maneuver with RW. Pt completed ~75% of pivot before fatiguing-recliner brought up behind pt for sitting.  Ambulation/Gait Ambulation/Gait Assistance: Not tested (comment)    Shoulder Instructions     Exercises     PT Diagnosis: Difficulty walking;Abnormality of gait;Acute pain;Generalized weakness  PT Problem List: Decreased strength;Decreased range of motion;Decreased activity tolerance;Decreased mobility;Decreased knowledge of precautions;Pain;Decreased knowledge of use of DME PT Treatment Interventions: DME instruction;Gait training;Functional mobility training;Therapeutic activities;Therapeutic exercise;Patient/family education   PT Goals Acute Rehab PT Goals PT Goal Formulation: With patient/family Time For Goal Achievement: 06/21/12 Potential to Achieve Goals: Good Pt will go Supine/Side to Sit: with mod assist PT Goal: Supine/Side to Sit - Progress: Goal set today Pt will go Sit to Supine/Side: with mod assist PT Goal: Sit to Supine/Side - Progress: Goal set today Pt will go Sit to Stand: with mod assist PT Goal: Sit to Stand - Progress: Goal set today Pt will Transfer Bed to Chair/Chair to Bed: with mod assist PT Transfer Goal: Bed to Chair/Chair to Bed - Progress: Goal set today Pt will Ambulate: 1 - 15 feet;with mod assist;with rolling walker PT Goal: Ambulate - Progress: Goal set today  Visit Information  Last PT Received On: 06/07/12 Assistance Needed: +2    Subjective Data  Subjective: "Okay.Marland KitchenMarland KitchenI'll try" Patient Stated Goal: Less pain. Get better.    Prior Functioning  Home Living Lives With: Alone Available Help at Discharge: Family Type of Home: House Additional Comments: Pt will need ST  rehab Prior  Function Level of Independence: Independent Communication Communication: No difficulties    Cognition  Overall Cognitive Status: Appears within functional limits for tasks assessed/performed Arousal/Alertness: Awake/alert Orientation Level: Appears intact for tasks assessed Behavior During Session: [facility] for tasks performed    Extremity/Trunk Assessment Right Lower Extremity Assessment RLE ROM/Strength/Tone: Deficits;Unable to fully assess;Due to pain RLE ROM/Strength/Tone Deficits: Some difficulty with hip flexion/advancing R LE during mobility.  Left Lower Extremity Assessment LLE ROM/Strength/Tone: Deficits;Unable to fully assess LLE ROM/Strength/Tone Deficits: Strength at least 3+/5 with functional activity   Balance Balance Balance Assessed: Yes Static Sitting Balance Static Sitting - Balance Support: Bilateral upper extremity supported;Feet supported Static Sitting - Level of Assistance: 4: Min assist Static Sitting - Comment/# of Minutes: Assist for trunk to full upright-pt leaning to L side. Sat EOB 2-3 minutes.   End of Session PT - End of Session Equipment Utilized During Treatment: Gait belt Activity Tolerance: Patient limited by pain;Patient limited by fatigue Patient left: in chair;with call bell/phone within reach;with family/visitor present Nurse Communication: Mobility status;Need for lift equipment;Precautions  GP     Rebeca Alert [facility] 06/07/2012, 11:29 AM [phone]

## 2012-06-07 NOTE — Progress Notes (Signed)
Patient evaluated for long-term disease management services with [facility] Management Program as benefit of her [facility] insurance. Noted discharge plan is likely SNF. Therefore, no immediate [facility] Care Management needs at this time. Patient will receive follow up call once she returns home from SNF at later time.   Raiford Noble, MSN-Ed, RN,BSN [facility] Liaison [phone]

## 2012-06-07 NOTE — Progress Notes (Signed)
Subjective: 1 Day Post-Op Procedure(s) (LRB): ARTHROPLASTY BIPOLAR HIP (Right) Patient reports pain as mild.    Objective: Vital signs in last 24 hours: Temp:  [98 F (36.7 C)-98.5 F (36.9 C)] 98.4 F (36.9 C) (10/14 1300) Pulse Rate:  [75-80] 80  (10/14 1300) Resp:  [16-18] 18  (10/14 1300) BP: (86-115)/(51-69) 100/65 mmHg (10/14 1300) SpO2:  [94 %-99 %] 98 % (10/14 1300)  Intake/Output from previous day: 10/13 0701 - 10/14 0700 In: 1760 [P.O.:60; I.V.:1700] Out: 1075 [Urine:975; Blood:100] Intake/Output this shift: Total I/O In: 120 [P.O.:120] Out: 900 [Urine:900]   Basename 06/07/12 0444 06/06/12 0529 06/05/12 1904  HGB 9.4* 11.7* 12.5    Basename 06/07/12 0444 06/06/12 0529  WBC 11.3* 11.8*  RBC 2.96* 3.75*  HCT 28.4* 36.2  PLT 185 235    Basename 06/07/12 0444 06/06/12 0529  NA 131* 135  K 4.6 5.0  CL 97 100  CO2 28 29  BUN 15 20  CREATININE 1.03 0.99  GLUCOSE 172* 116*  CALCIUM 8.5 9.0    Basename 06/06/12 0529 06/05/12 1904  LABPT -- --  INR 1.08 1.03    Neurologically intact  Assessment/Plan: 1 Day Post-Op Procedure(s) (LRB): ARTHROPLASTY BIPOLAR HIP (Right) Mild pain. Confused somewhat,  OOB to recliner. Leg length equal , xrays look good.   [name] [name] C 06/07/2012, 3:56 PM

## 2012-06-07 NOTE — Clinical Social Work Placement (Unsigned)
     Clinical Social Work Department CLINICAL SOCIAL WORK PLACEMENT NOTE 06/07/2012  Patient:  [name] [name], [name] [name]  Account Number:  [id number] Admit date:  06/05/2012  Clinical Social Worker:  Becky Sax, LCSW  Date/time:  06/07/2012 12:00 M  Clinical Social Work is seeking post-discharge placement for this patient at the following level of care:   SKILLED NURSING   (*CSW will update this form in Epic as items are completed)   06/07/2012  Patient/family provided with [facility] Department of Clinical Social Works list of facilities offering this level of care within the geographic area requested by the patient (or if unable, by the patients family).  06/07/2012  Patient/family informed of their freedom to choose among providers that offer the needed level of care, that participate in Medicare, Medicaid or managed care program needed by the patient, have an available bed and are willing to accept the patient.  06/07/2012  Patient/family informed of [facility] ownership interest in [facility], as well as of the fact that they are under no obligation to receive care at this facility.  PASARR submitted to EDS on 06/07/2012 PASARR number received from EDS on 06/07/2012  FL2 transmitted to all facilities in geographic area requested by pt/family on  06/07/2012 FL2 transmitted to all facilities within larger geographic area on 06/07/2012  Patient informed that his/her managed care company has contracts with or will negotiate with  certain facilities, including the following:     Patient/family informed of bed offers received:   Patient chooses bed at  Physician recommends and patient chooses bed at    Patient to be transferred to  on   Patient to be transferred to facility by   The following physician request were entered in Epic:   Additional Comments:

## 2012-06-08 ENCOUNTER — Encounter (HOSPITAL_COMMUNITY): Payer: Self-pay | Admitting: Orthopaedic Surgery

## 2012-06-08 LAB — CBC
HCT: 27.6 % — ABNORMAL LOW (ref 36.0–46.0)
Hemoglobin: 9.1 g/dL — ABNORMAL LOW (ref 12.0–15.0)
MCH: 31.3 pg (ref 26.0–34.0)
MCHC: 33 g/dL (ref 30.0–36.0)
MCV: 94.8 fL (ref 78.0–100.0)

## 2012-06-08 LAB — BASIC METABOLIC PANEL
BUN: 15 mg/dL (ref 6–23)
Calcium: 8.8 mg/dL (ref 8.4–10.5)
Creatinine, Ser: 0.91 mg/dL (ref 0.50–1.10)
GFR calc non Af Amer: 54 mL/min — ABNORMAL LOW (ref >90)
Glucose, Bld: 130 mg/dL — ABNORMAL HIGH (ref 70–99)
Potassium: 3.9 mEq/L (ref 3.5–5.1)

## 2012-06-08 MED ORDER — DSS 100 MG PO CAPS: 100.0000 mg | ORAL_CAPSULE | Freq: Two times a day (BID) | ORAL | Status: DC

## 2012-06-08 MED ORDER — HYDROCODONE-ACETAMINOPHEN 5-500 MG PO CAPS: 1.0000 | ORAL_CAPSULE | Freq: Four times a day (QID) | ORAL | Status: DC | PRN

## 2012-06-08 MED ORDER — FERROUS FUMARATE 325 (106 FE) MG PO TABS: 1.0000 | ORAL_TABLET | Freq: Two times a day (BID) | ORAL | Status: DC

## 2012-06-08 NOTE — Progress Notes (Addendum)
Patient cleared for discharge. CSW gave patient bed offers. Patient defers to her daughter. Message left with daughter. Request for auth put in to [employer] through provider link.  Maikayla Beggs C. Silvanna Ohmer MSW, [place] [phone] CSW met with son at bedside. Gave bed offers.  Octavius Shin C. Machael Raine MSW, [place] [phone] Family requesting [facility]. Packet copied and placed in [place].  Dryden Tapley C. Ryzen Deady MSW, LCSW [phone]

## 2012-06-08 NOTE — Progress Notes (Signed)
Called and gave report to nurse at [facility] at 1259. No changes noted in pt since AM assessment.  Dorris Fetch, RN.

## 2012-06-08 NOTE — Discharge Summary (Signed)
Physician Discharge Summary  BRAIDEN PRESUTTI NFA:[id number] DOB: May 31, 1919 DOA: 06/05/2012  PCP: No primary provider on file.  Admit date: 06/05/2012 Discharge date: 06/08/2012  Recommendations for Outpatient Follow-up:  1-Follow up with Dr. Ophelia Charter in 2 weeks for follow up and evaluation after hip surgery.  Discharge Diagnoses:  Principal Problem:  *Hip fracture Active Problems:  Fall at home  CAD (coronary artery disease)  Platelet inhibition due to Plavix  Hypertension  UTI (lower urinary tract infection)  Glaucoma  Postoperative anemia   Discharge Condition: stable and improved. Patient to SNF for physical rehab. Follow wound care and weight bearing instructions as specified by orthopedic service.  Diet recommendation: low sodium diet.  Filed Weights   06/06/12 0045  Weight: 83.144 kg (183 lb 4.8 oz)    History of present illness:  78 yo highly functioning female that lives at home that was going to scramble some eggs and somehow fell in the kitchen. Comes in with rt hip pain and found to have rt hip fracture. Normal state of health prior to this accident. H/o stent cad about 10 years. No CP or SOB at this moment.   Hospital Course:  1-Hip fracture: s/p right hip hemiarthroplasty. Patient stable and ready to be discharged to SNF for physical rehab. Patient will follow with orthopedic service in 2 weeks, will use PRN Lorcet for pain and will use full dose ASA for anticoagulation. She is also on plavix, which has been now resumed.   2-Fall at home: mechanical fall; no hx of syncope, palpitations or precipitating factors. Telemetry has been unremarkable; troponin negative and no signs of acute infection. Close monitoring and PT/OT at the facility  3-CAD (coronary artery disease): will continue ASA, plavix and metoprolol; ok to resume baby ASA on daily basis after ok by ortho.  4-Hypertension: stable; will continue metoprolol and lasix. Patient to follow low sodium diet.    5-UTI: no dysuria and antibiotic treatment completed in house.  6-Glaucoma:continue eye drops.   7-Post operative anemia: Hgb 9.1 and stable; patient asymptomatic. Will use  ferrous sulfate BID and follow Hgb level in 2 weeks if less than 8.5 will transfuse 2 units.   Procedures: Right hip hemiarthroplasty   Consultations:  Ortho  Discharge Exam: Filed Vitals:   06/08/12 0000 06/08/12 0143 06/08/12 0400 06/08/12 0619  BP:  106/64  117/70  Pulse:  68  90  Temp:  98.2 F (36.8 C)  98.1 F (36.7 C)  TempSrc:  Oral  Oral  Resp: 20 18 18 17   Height:      Weight:      SpO2: 93% 99% 92% 94%    General: NAD; afebrile; mild right hip pain Cardiovascular: S1 and S2; no rubs or gallops Respiratory: CTA bilaterally Abdomen: soft, NT, ND and with positive BS Extremities:trace edema and mild pain on her Right hip area, especially with movement. Neuro:non focal deficit  Discharge Instructions  Discharge Orders    Future Orders Please Complete By Expires   Diet - low sodium heart healthy      Weight bearing as tolerated      Comments:   Right lower extremity with walker   Place TED hose      Scheduling Instructions:   Bilateral knee hi TEDS during the day and remove at night   Increase activity slowly      Discharge instructions      Comments:   -Take medications as prescribed -Follow with Orthopedic service as instructed -PT/OT for physical  rehab at the facility       Medication List     As of 06/08/2012  9:53 AM    STOP taking these medications         aspirin 81 MG tablet      ciprofloxacin 500 MG tablet   Commonly known as: CIPRO      TAKE these medications         aspirin EC 325 MG tablet   Take 1 tablet (325 mg total) by mouth daily.      clopidogrel 75 MG tablet   Commonly known as: PLAVIX   Take 75 mg by mouth daily.      Dorzolamide HCl-Timolol Mal PF 22.3-6.8 MG/ML Soln   Apply 1 drop to eye every 12 (twelve) hours.      DSS 100 MG Caps    Take 100 mg by mouth 2 (two) times daily.      ferrous fumarate 325 (106 FE) MG Tabs   Commonly known as: HEMOCYTE - 106 mg FE   Take 1 tablet (106 mg of iron total) by mouth 2 (two) times daily.      furosemide 20 MG tablet   Commonly known as: LASIX   Take 20 mg by mouth daily.      hydrocodone-acetaminophen 5-500 MG per capsule   Commonly known as: LORCET-HD   Take 1 capsule by mouth every 6 (six) hours as needed for pain.      latanoprost 0.005 % ophthalmic solution   Commonly known as: XALATAN   Place 1 drop into both eyes at bedtime.      metoprolol tartrate 25 MG tablet   Commonly known as: LOPRESSOR   Take 25 mg by mouth 2 (two) times daily.           Follow-up Information    Follow up with YATES,MARK C, MD. In 2 weeks.   Contact information:   [facility] [address] [place] [address] [phone]           The results of significant diagnostics from this hospitalization (including imaging, microbiology, ancillary and laboratory) are listed below for reference.    Significant Diagnostic Studies: Dg Hip Complete Right  06/05/2012  *RADIOLOGY REPORT*  Clinical Data: Fall.  Right hip pain.  RIGHT HIP - COMPLETE 2+ VIEW  Comparison: 10/18/2010  Findings: Fragmented spur noted along the left anterior inferior iliac spine, chronic.  Lower lumbar spondylosis is observed.  The patient is unable to fully externally rotate the right hip, reducing sensitivity in assessment.  On the cross table lateral view it is difficult to determine which side of the right and which sized left, although I suspect that the right hip is more anterior. There is some irregularity the junction the right femoral head and neck suspicious for fracture of the femoral neck.  Consider CT scan for confirmation.  IMPRESSION:  1.  Suspicious appearance for subcapital hip fracture on the right - consider CT scan for confirmation. 2.  Lower lumbar spondylosis.    Original Report Authenticated By: Dellia Cloud, M.D.    Dg Pelvis Portable  06/06/2012  *RADIOLOGY REPORT*  Clinical Data: Right hip fracture.  PORTABLE PELVIS  Comparison: None.  Findings: A right hip prosthesis is seen in expected position.  No evidence of fracture or dislocation.  Overlying skin staples are noted.  IMPRESSION: Expected postoperative appearance of right hip prosthesis.   Original Report Authenticated By: Danae Orleans, M.D.    Ct  Hip Right Wo Contrast  06/05/2012  *RADIOLOGY REPORT*  Clinical Data: Fall.  Hip pain.  CT OF THE RIGHT HIP WITHOUT CONTRAST  Technique:  Multidetector CT imaging was performed according to the standard protocol. Multiplanar CT image reconstructions were also generated.  Comparison: 06/05/2012  Findings: Subcapital right femoral neck fracture is observed with mild impaction particularly anteriorly, causing apex anterior angulation.  Abnormal bruising/stranding noted in the subcutaneous tissues lateral to the hip.  Sigmoid diverticulosis is observed along with vascular calcifications.  Gluteus minimus atrophy noted.  IMPRESSION: 1.  Subcapital right femoral neck fracture.  2.  Bruising or edema in the subcutaneous tissues lateral to the right greater trochanter.   Original Report Authenticated By: Dellia Cloud, M.D.    Dg Chest Portable 1 View  06/05/2012  *RADIOLOGY REPORT*  Clinical Data: Fall.  Hypertension.  PORTABLE CHEST - 1 VIEW  Comparison: 03/07/2010  Findings: The cardiac silhouette is mildly enlarged.  No mediastinal or hilar mass or adenopathy is noted.  The aorta is tortuous.  The lungs are clear.  No pleural effusion or pneumothorax.  The bony thorax is demineralized but intact.  IMPRESSION: No acute cardiopulmonary disease   Original Report Authenticated By: Domenic Moras, M.D.    Dg Knee Complete 4 Views Right  06/05/2012  *RADIOLOGY REPORT*  Clinical Data: Fall - right knee pain  RIGHT KNEE - COMPLETE 4+ VIEW  Comparison:  None.  Findings: Degenerative loss of medial compartmental articular space noted.  No knee effusion observed.  Distal superficial femoral artery and popliteal artery atherosclerotic calcification noted. Patellofemoral spurring is present.  No fracture identified.  IMPRESSION:  1.  Osteoarthritis. 2.  No acute bony findings are identified. 3.  Atherosclerosis.   Original Report Authenticated By: Dellia Cloud, M.D.     Microbiology: Recent Results (from the past 240 hour(s))  URINE CULTURE     Status: Normal   Collection Time   06/05/12  7:56 PM      Component Value Range Status Comment   Specimen Description URINE, CATHETERIZED   Final    Special Requests Normal   Final    Culture  Setup Time 06/06/2012 02:54   Final    Colony Count NO GROWTH   Final    Culture NO GROWTH   Final    Report Status 06/07/2012 FINAL   Final   SURGICAL PCR SCREEN     Status: Normal   Collection Time   06/06/12  1:04 AM      Component Value Range Status Comment   MRSA, PCR NEGATIVE  NEGATIVE Final    Staphylococcus aureus NEGATIVE  NEGATIVE Final      Labs: Basic Metabolic Panel:  Lab 06/08/12 1610 06/07/12 0444 06/06/12 0529 06/05/12 1904  NA 132* 131* 135 135  K 3.9 4.6 5.0 4.2  CL 96 97 100 98  CO2 27 28 29 28   GLUCOSE 130* 172* 116* 115*  BUN 15 15 20  24*  CREATININE 0.91 1.03 0.99 0.89  CALCIUM 8.8 8.5 9.0 9.4  MG -- -- -- --  PHOS -- -- -- --   Liver Function Tests:  Lab 06/05/12 1904  AST 26  ALT 14  ALKPHOS 76  BILITOT 0.3  PROT 7.7  ALBUMIN 3.6   CBC:  Lab 06/08/12 0452 06/07/12 0444 06/06/12 0529 06/05/12 1904  WBC 11.4* 11.3* 11.8* 12.9*  NEUTROABS -- -- -- 10.3*  HGB 9.1* 9.4* 11.7* 12.5  HCT 27.6* 28.4* 36.2 37.9  MCV 94.8  95.9 96.5 94.8  PLT 185 185 235 272   Time coordinating discharge: >30 minutes  Signed:  Kenai Fluegel  [facility] 06/08/2012, 9:53 AM

## 2012-06-08 NOTE — Progress Notes (Addendum)
Subjective: 2 Days Post-Op Procedure(s) (LRB): ARTHROPLASTY BIPOLAR HIP (Right) Patient reports pain as mild.    Objective: Vital signs in last 24 hours: Temp:  [98 F (36.7 C)-98.6 F (37 C)] 98.1 F (36.7 C) (10/15 0619) Pulse Rate:  [68-90] 90  (10/15 0619) Resp:  [16-20] 17  (10/15 0619) BP: (99-154)/(62-76) 117/70 mmHg (10/15 0619) SpO2:  [92 %-99 %] 94 % (10/15 0619)  Intake/Output from previous day: 10/14 0701 - 10/15 0700 In: 120 [P.O.:120] Out: 1300 [Urine:1300] Intake/Output this shift:     Basename 06/08/12 0452 06/07/12 0444 06/06/12 0529 06/05/12 1904  HGB 9.1* 9.4* 11.7* 12.5    Basename 06/08/12 0452 06/07/12 0444  WBC 11.4* 11.3*  RBC 2.91* 2.96*  HCT 27.6* 28.4*  PLT 185 185    Basename 06/08/12 0452 06/07/12 0444  NA 132* 131*  K 3.9 4.6  CL 96 97  CO2 27 28  BUN 15 15  CREATININE 0.91 1.03  GLUCOSE 130* 172*  CALCIUM 8.8 8.5    Basename 06/06/12 0529 06/05/12 1904  LABPT -- --  INR 1.08 1.03    OOB to recliner.    Assessment/Plan: 2 Days Post-Op Procedure(s) (LRB): ARTHROPLASTY BIPOLAR HIP (Right) Discharge to SNF when medically ready.   Deasia Chiu C 06/08/2012, 7:49 AM

## 2012-07-26 ENCOUNTER — Emergency Department (HOSPITAL_COMMUNITY): Payer: Medicare Other

## 2012-07-26 ENCOUNTER — Encounter (HOSPITAL_COMMUNITY): Payer: Self-pay

## 2012-07-26 ENCOUNTER — Emergency Department (HOSPITAL_COMMUNITY)
Admission: EM | Admit: 2012-07-26 | Discharge: 2012-07-27 | Disposition: A | Discharge status: Home / Self Care | Payer: Medicare Other | Attending: Emergency Medicine | Admitting: Emergency Medicine

## 2012-07-26 DIAGNOSIS — Z9889 Other specified postprocedural states: Secondary | ICD-10-CM | POA: Insufficient documentation

## 2012-07-26 DIAGNOSIS — W19XXXA Unspecified fall, initial encounter: Secondary | ICD-10-CM

## 2012-07-26 DIAGNOSIS — Z96649 Presence of unspecified artificial hip joint: Secondary | ICD-10-CM | POA: Insufficient documentation

## 2012-07-26 DIAGNOSIS — Z79899 Other long term (current) drug therapy: Secondary | ICD-10-CM | POA: Insufficient documentation

## 2012-07-26 DIAGNOSIS — Y92009 Unspecified place in unspecified non-institutional (private) residence as the place of occurrence of the external cause: Secondary | ICD-10-CM | POA: Insufficient documentation

## 2012-07-26 DIAGNOSIS — Y9389 Activity, other specified: Secondary | ICD-10-CM | POA: Insufficient documentation

## 2012-07-26 DIAGNOSIS — S79929A Unspecified injury of unspecified thigh, initial encounter: Secondary | ICD-10-CM | POA: Insufficient documentation

## 2012-07-26 DIAGNOSIS — Z7982 Long term (current) use of aspirin: Secondary | ICD-10-CM | POA: Insufficient documentation

## 2012-07-26 DIAGNOSIS — M25559 Pain in unspecified hip: Secondary | ICD-10-CM

## 2012-07-26 DIAGNOSIS — S79919A Unspecified injury of unspecified hip, initial encounter: Secondary | ICD-10-CM | POA: Insufficient documentation

## 2012-07-26 DIAGNOSIS — Z8739 Personal history of other diseases of the musculoskeletal system and connective tissue: Secondary | ICD-10-CM | POA: Insufficient documentation

## 2012-07-26 DIAGNOSIS — W1789XA Other fall from one level to another, initial encounter: Secondary | ICD-10-CM | POA: Insufficient documentation

## 2012-07-26 DIAGNOSIS — I10 Essential (primary) hypertension: Secondary | ICD-10-CM | POA: Insufficient documentation

## 2012-07-26 DIAGNOSIS — I252 Old myocardial infarction: Secondary | ICD-10-CM | POA: Insufficient documentation

## 2012-07-26 MED ORDER — HYDROCODONE-ACETAMINOPHEN 7.5-500 MG/15ML PO SOLN
5.0000 mL | Freq: Once | ORAL | Status: AC
  Administered 2012-07-26: 5 mL via ORAL
  Filled 2012-07-26: qty 15

## 2012-07-26 MED ORDER — HYDROCODONE-ACETAMINOPHEN 7.5-500 MG/15ML PO SOLN: 5.0000 mL | Freq: Four times a day (QID) | ORAL | Status: DC | PRN

## 2012-07-26 NOTE — ED Notes (Signed)
Per EMS- Patient is from home. Patient reported that her walker got away from her and fell, landing on her buttocks and lower back. Patient recently had a hip replacement in 05/2012.taient and patient's family wanted the patient evaluated to make sure nothing was wrong with her hip replacement. Patient denies any pain at this time.

## 2012-07-26 NOTE — ED Provider Notes (Signed)
History     CSN: [id number]  Arrival date & time 07/26/12  1730   First MD Initiated Contact with Patient 07/26/12 2002      Chief Complaint  Patient presents with  . Fall    (Consider location/radiation/quality/duration/timing/severity/associated sxs/prior treatment) HPI Comments: Patient was standing at her dresser when she fell backwards landing on her buttock and R hip.  She was unable to get up on her own and had significant pain with movement. Had R hip replacement 11/13 and has completed rehab. Patient has no pain while still and has not tried to stand or walk   Patient is a 78 y.o. female presenting with fall. The history is provided by the patient and a relative.  Fall The accident occurred 1 to 2 hours ago. The fall occurred while walking. She fell from a height of 3 to 5 ft. She landed on carpet. There was no blood loss. The pain is present in the right hip. The pain is at a severity of 8/10. The pain is moderate. She was not ambulatory at the scene. There was no entrapment after the fall. There was no drug use involved in the accident. There was no alcohol use involved in the accident. Pertinent negatives include no numbness. The symptoms are aggravated by standing. She has tried nothing for the symptoms.    Past Medical History  Diagnosis Date  . Hypertension   . Cataract     both eyes  . Heart attack 2001    stents put in  . Arthritis   . Glaucoma   . Poor circulation     Past Surgical History  Procedure Date  . Abdominal hysterectomy 1972  . Eye surgery 2007/2008    cataracts  . Cardiac surgery 2001    stent placement  . Hernia repair 2009  . Gallbladder surgery 2009  . Hip arthroplasty 06/06/2012    Procedure: ARTHROPLASTY BIPOLAR HIP;  Surgeon: Eldred Manges, MD;  Location: [facility];  Service: Orthopedics;  Laterality: Right;  monopolar    Family History  Problem Relation Age of Onset  . Arthritis Father   . Stroke Father   . Hypertension Father   .  Heart disease Sister   . Heart disease Brother   . Hypertension Brother   . Stroke Brother     History  Substance Use Topics  . Smoking status: Never Smoker   . Smokeless tobacco: Never Used  . Alcohol Use: No    OB History    Grav Para Term Preterm Abortions TAB SAB Ect Mult Living   5 4   1  1          Review of Systems  Constitutional: Positive for activity change.  HENT: Negative.   Eyes: Negative.   Respiratory: Negative for shortness of breath.   Cardiovascular: Negative for leg swelling.  Musculoskeletal: Negative for back pain.  Skin: Negative for wound.  Neurological: Negative for weakness and numbness.    Allergies  Codeine; Morphine and related; and Penicillins  Home Medications   Current Outpatient Rx  Name  Route  Sig  Dispense  Refill  . ACETAMINOPHEN 500 MG PO TABS   Oral   Take 500 mg by mouth every 6 (six) hours as needed. pain         . ASPIRIN EC 325 MG PO TBEC   Oral   Take 1 tablet (325 mg total) by mouth daily.   30 tablet   0   . CLOPIDOGREL  BISULFATE 75 MG PO TABS   Oral   Take 75 mg by mouth daily.          . DORZOLAMIDE HCL-TIMOLOL MAL PF 22.3-6.8 MG/ML OP SOLN   Ophthalmic   Apply 1 drop to eye every 12 (twelve) hours.         . FERROUS FUMARATE 325 (106 FE) MG PO TABS   Oral   Take 1 tablet (106 mg of iron total) by mouth 2 (two) times daily.   30 each      . OMEGA-3 FATTY ACIDS 1000 MG PO CAPS   Oral   Take 1 g by mouth daily.         . FUROSEMIDE 20 MG PO TABS   Oral   Take 20 mg by mouth daily.          Marland Kitchen LATANOPROST 0.005 % OP SOLN   Both Eyes   Place 1 drop into both eyes at bedtime.         Marland Kitchen METOPROLOL TARTRATE 25 MG PO TABS   Oral   Take 25 mg by mouth 2 (two) times daily.         . ICAPS AREDS FORMULA PO TABS   Oral   Take 1 tablet by mouth daily.         Marland Kitchen HYDROCODONE-ACETAMINOPHEN 7.5-500 MG/15ML PO SOLN   Oral   Take 5 mLs by mouth every 6 (six) hours as needed for pain.   120  mL   0     BP 160/57  Pulse 77  Temp 98.3 F (36.8 C) (Oral)  Resp 18  SpO2 95%  Physical Exam  Constitutional: She appears well-developed and well-nourished.  HENT:  Head: Normocephalic.  Eyes: Pupils are equal, round, and reactive to light.  Neck: Normal range of motion.  Cardiovascular: Normal rate.   Pulmonary/Chest: Effort normal.  Abdominal: Soft. Bowel sounds are normal. She exhibits no distension. There is no tenderness.  Musculoskeletal:       Right hip: She exhibits decreased range of motion. She exhibits normal strength, no tenderness, no bony tenderness, no swelling, no crepitus, no deformity and no laceration.       Legs:      Pelvis stable   Neurological: She is alert.  Skin: Skin is warm.    ED Course  Procedures (including critical care time)  Labs Reviewed - No data to display Dg Pelvis 1-2 Views  07/26/2012  *RADIOLOGY REPORT*  Clinical Data: Fall.  Pelvic pain.  Recently postop from right hip arthroplasty.  PELVIS - 1-2 VIEW  Comparison: 06/06/2012  Findings: The right hip prosthesis remains in appropriate position. No evidence of pelvic or hip fracture.  No evidence of hip joint dislocation.  Lower lumbar spine and left hip degenerative changes are noted.  IMPRESSION: No acute findings.   Original Report Authenticated By: Myles Rosenthal, M.D.      1. Fall   2. Hip pain       MDM  Will medicate obtain xrays of pelvis that attempt ambulation         Arman Filter, NP 07/26/12 2329

## 2012-07-26 NOTE — ED Notes (Signed)
MVH:[id number]<GE> Expected date:<BR> Expected time:<BR> Means of arrival:<BR> Comments:<BR> 89 fall ems

## 2012-07-27 ENCOUNTER — Encounter (HOSPITAL_COMMUNITY): Payer: Self-pay | Admitting: Emergency Medicine

## 2012-07-27 ENCOUNTER — Emergency Department (HOSPITAL_COMMUNITY): Payer: Medicare Other

## 2012-07-27 ENCOUNTER — Emergency Department (HOSPITAL_COMMUNITY)
Admission: EM | Admit: 2012-07-27 | Discharge: 2012-07-27 | Disposition: A | Discharge status: Home / Self Care | Payer: Medicare Other | Attending: Emergency Medicine | Admitting: Emergency Medicine

## 2012-07-27 DIAGNOSIS — Y929 Unspecified place or not applicable: Secondary | ICD-10-CM | POA: Insufficient documentation

## 2012-07-27 DIAGNOSIS — S7000XA Contusion of unspecified hip, initial encounter: Secondary | ICD-10-CM | POA: Insufficient documentation

## 2012-07-27 DIAGNOSIS — R5381 Other malaise: Secondary | ICD-10-CM | POA: Insufficient documentation

## 2012-07-27 DIAGNOSIS — M6250 Muscle wasting and atrophy, not elsewhere classified, unspecified site: Secondary | ICD-10-CM | POA: Insufficient documentation

## 2012-07-27 DIAGNOSIS — Z79899 Other long term (current) drug therapy: Secondary | ICD-10-CM | POA: Insufficient documentation

## 2012-07-27 DIAGNOSIS — W1789XA Other fall from one level to another, initial encounter: Secondary | ICD-10-CM | POA: Insufficient documentation

## 2012-07-27 DIAGNOSIS — I252 Old myocardial infarction: Secondary | ICD-10-CM | POA: Insufficient documentation

## 2012-07-27 DIAGNOSIS — S7001XA Contusion of right hip, initial encounter: Secondary | ICD-10-CM

## 2012-07-27 DIAGNOSIS — R0602 Shortness of breath: Secondary | ICD-10-CM | POA: Insufficient documentation

## 2012-07-27 DIAGNOSIS — Z95818 Presence of other cardiac implants and grafts: Secondary | ICD-10-CM | POA: Insufficient documentation

## 2012-07-27 DIAGNOSIS — Y9301 Activity, walking, marching and hiking: Secondary | ICD-10-CM | POA: Insufficient documentation

## 2012-07-27 DIAGNOSIS — R55 Syncope and collapse: Secondary | ICD-10-CM

## 2012-07-27 DIAGNOSIS — Z7982 Long term (current) use of aspirin: Secondary | ICD-10-CM | POA: Insufficient documentation

## 2012-07-27 DIAGNOSIS — IMO0001 Reserved for inherently not codable concepts without codable children: Secondary | ICD-10-CM | POA: Insufficient documentation

## 2012-07-27 DIAGNOSIS — M7989 Other specified soft tissue disorders: Secondary | ICD-10-CM | POA: Insufficient documentation

## 2012-07-27 DIAGNOSIS — I10 Essential (primary) hypertension: Secondary | ICD-10-CM | POA: Insufficient documentation

## 2012-07-27 DIAGNOSIS — Z8739 Personal history of other diseases of the musculoskeletal system and connective tissue: Secondary | ICD-10-CM | POA: Insufficient documentation

## 2012-07-27 LAB — URINALYSIS, ROUTINE W REFLEX MICROSCOPIC
Bilirubin Urine: NEGATIVE
Leukocytes, UA: NEGATIVE
Nitrite: NEGATIVE
Protein, ur: NEGATIVE mg/dL

## 2012-07-27 LAB — COMPREHENSIVE METABOLIC PANEL
ALT: 12 U/L (ref 0–35)
AST: 27 U/L (ref 0–37)
Calcium: 9.6 mg/dL (ref 8.4–10.5)
Sodium: 136 mEq/L (ref 135–145)
Total Protein: 8.1 g/dL (ref 6.0–8.3)

## 2012-07-27 LAB — CBC WITH DIFFERENTIAL/PLATELET
Basophils Absolute: 0 10*3/uL (ref 0.0–0.1)
Eosinophils Absolute: 0.3 10*3/uL (ref 0.0–0.7)
Eosinophils Relative: 3 % (ref 0–5)
Lymphocytes Relative: 20 % (ref 12–46)
MCH: 30.3 pg (ref 26.0–34.0)
MCV: 94.6 fL (ref 78.0–100.0)
Platelets: 309 10*3/uL (ref 150–400)
RDW: 14.2 % (ref 11.5–15.5)
WBC: 8.1 10*3/uL (ref 4.0–10.5)

## 2012-07-27 MED ORDER — TRAMADOL HCL 50 MG PO TABS: 50.0000 mg | ORAL_TABLET | Freq: Four times a day (QID) | ORAL | Status: DC | PRN

## 2012-07-27 NOTE — ED Provider Notes (Signed)
History     CSN: [id number]  Arrival date & time 07/27/12  0827   First MD Initiated Contact with Patient 07/27/12 [phone]      Chief Complaint  Patient presents with  . Weakness    (Consider location/radiation/quality/duration/timing/severity/associated sxs/prior treatment) HPI Pt was recently released from rehab after R hip repair by Dr Ophelia Charter.She has had increasing DOE and decreasing mobility since. Yesterday she fell from standing onto R hip. No head or neck pain. No LOC. Seen in ED yesterday and had xrays which were normal. Was able to ambulate and d/c'd home. This Am pt was walking across floor and had R hip pain with each step. She became SOB and daughter helped her to chair at which point she became briefly unresponsive. Pt denies LOC. No chest pain, cough, fever, chills, urinary symptoms, abd pain, N/V/D.  Past Medical History  Diagnosis Date  . Hypertension   . Cataract     both eyes  . Heart attack 2001    stents put in  . Arthritis   . Glaucoma   . Poor circulation     Past Surgical History  Procedure Date  . Abdominal hysterectomy 1972  . Eye surgery 2007/2008    cataracts  . Cardiac surgery 2001    stent placement  . Hernia repair 2009  . Gallbladder surgery 2009  . Hip arthroplasty 06/06/2012    Procedure: ARTHROPLASTY BIPOLAR HIP;  Surgeon: Eldred Manges, MD;  Location: [facility];  Service: Orthopedics;  Laterality: Right;  monopolar    Family History  Problem Relation Age of Onset  . Arthritis Father   . Stroke Father   . Hypertension Father   . Heart disease Sister   . Heart disease Brother   . Hypertension Brother   . Stroke Brother     History  Substance Use Topics  . Smoking status: Never Smoker   . Smokeless tobacco: Never Used  . Alcohol Use: No    OB History    Grav Para Term Preterm Abortions TAB SAB Ect Mult Living   5 4   1  1          Review of Systems  Constitutional: Positive for fatigue. Negative for fever and chills.  HENT:  Negative for neck pain and neck stiffness.   Respiratory: Positive for shortness of breath. Negative for cough and wheezing.   Cardiovascular: Positive for leg swelling. Negative for chest pain and palpitations.  Gastrointestinal: Negative for nausea, vomiting, abdominal pain and diarrhea.  Genitourinary: Negative for dysuria and flank pain.  Musculoskeletal: Positive for myalgias, arthralgias and gait problem. Negative for back pain.  Skin: Negative for rash and wound.  Neurological: Negative for dizziness, weakness, light-headedness, numbness and headaches.    Allergies  Codeine; Morphine and related; and Penicillins  Home Medications   Current Outpatient Rx  Name  Route  Sig  Dispense  Refill  . ACETAMINOPHEN 500 MG PO TABS   Oral   Take 1,000 mg by mouth every 6 (six) hours as needed. pain         . ASPIRIN EC 81 MG PO TBEC   Oral   Take 81 mg by mouth daily.         Marland Kitchen CLOPIDOGREL BISULFATE 75 MG PO TABS   Oral   Take 75 mg by mouth daily.          . DORZOLAMIDE HCL-TIMOLOL MAL PF 22.3-6.8 MG/ML OP SOLN   Ophthalmic   Apply 1 drop to  eye every 12 (twelve) hours.         . FERROUS FUMARATE 325 (106 FE) MG PO TABS   Oral   Take 1 tablet (106 mg of iron total) by mouth 2 (two) times daily.   30 each      . OMEGA-3 FATTY ACIDS 1000 MG PO CAPS   Oral   Take 1 g by mouth daily.         . FUROSEMIDE 20 MG PO TABS   Oral   Take 20 mg by mouth daily.          Marland Kitchen HYDROCODONE-ACETAMINOPHEN 7.5-500 MG/15ML PO SOLN   Oral   Take 5 mLs by mouth every 6 (six) hours as needed for pain.   120 mL   0   . LATANOPROST 0.005 % OP SOLN   Both Eyes   Place 1 drop into both eyes at bedtime.         Marland Kitchen METOPROLOL TARTRATE 25 MG PO TABS   Oral   Take 25 mg by mouth 2 (two) times daily.         . ICAPS AREDS FORMULA PO TABS   Oral   Take 1 tablet by mouth daily.         . TRAMADOL HCL 50 MG PO TABS   Oral   Take 1 tablet (50 mg total) by mouth every 6 (six)  hours as needed for pain.   15 tablet   0     BP 121/52  Pulse 68  Temp 97.7 F (36.5 C) (Oral)  Resp 16  SpO2 96%  Physical Exam  Nursing note and vitals reviewed. Constitutional: She is oriented to person, place, and time. She appears well-developed and well-nourished. No distress.  HENT:  Head: Normocephalic and atraumatic.  Mouth/Throat: Oropharynx is clear and moist.  Eyes: EOM are normal. Pupils are equal, round, and reactive to light.  Neck: Normal range of motion. Neck supple.       No posterior cervical TTP  Cardiovascular: Normal rate and regular rhythm.   Pulmonary/Chest: Effort normal. No respiratory distress. She has no wheezes. She has rales (Scattered rales at bases). She exhibits no tenderness.  Abdominal: Soft. Bowel sounds are normal. She exhibits no mass. There is no tenderness. There is no rebound and no guarding.  Musculoskeletal: Normal range of motion. She exhibits tenderness (TTP over lateral R hip and pain with ROM. ). She exhibits no edema.       Non-pitting, mild bl lower ext swelling. 2+DP bl.   Neurological: She is alert and oriented to person, place, and time.       4/5 motor in all ext without unilateral deficit.   Skin: Skin is warm and dry. No rash noted. No erythema.  Psychiatric: She has a normal mood and affect. Her behavior is normal.    ED Course  Procedures (including critical care time)  Labs Reviewed  CBC WITH DIFFERENTIAL - Abnormal; Notable for the following:    Hemoglobin 11.8 (*)     All other components within normal limits  COMPREHENSIVE METABOLIC PANEL - Abnormal; Notable for the following:    Potassium 3.4 (*)     Chloride 95 (*)     Glucose, Bld 117 (*)     Albumin 3.4 (*)     GFR calc non Af Amer 51 (*)     GFR calc Af Amer 59 (*)     All other components within normal limits  PRO B  NATRIURETIC PEPTIDE - Abnormal; Notable for the following:    Pro B Natriuretic peptide (BNP) 1110.0 (*)     All other components within  normal limits  URINALYSIS, ROUTINE W REFLEX MICROSCOPIC - Abnormal; Notable for the following:    Ketones, ur TRACE (*)     All other components within normal limits  POCT I-STAT TROPONIN I   Dg Chest 1 View  07/27/2012  *RADIOLOGY REPORT*  Clinical Data: Shortness of breath.  CHEST - 1 VIEW  Comparison: 06/05/2012  Findings: Heart is borderline in size.  Stable tortuosity of the thoracic aorta.  No overt edema.  No confluent airspace opacities or effusions.  No acute bony abnormality.  IMPRESSION: Stable borderline heart size.  No acute findings.   Original Report Authenticated By: Charlett Nose, M.D.    Dg Pelvis 1-2 Views  07/26/2012  *RADIOLOGY REPORT*  Clinical Data: Fall.  Pelvic pain.  Recently postop from right hip arthroplasty.  PELVIS - 1-2 VIEW  Comparison: 06/06/2012  Findings: The right hip prosthesis remains in appropriate position. No evidence of pelvic or hip fracture.  No evidence of hip joint dislocation.  Lower lumbar spine and left hip degenerative changes are noted.  IMPRESSION: No acute findings.   Original Report Authenticated By: Myles Rosenthal, M.D.    Ct Hip Right Wo Contrast  07/27/2012  *RADIOLOGY REPORT*  Clinical Data: Right hip pain since a fall yesterday.  CT OF THE RIGHT HIP WITHOUT CONTRAST  Technique:  Multidetector CT imaging was performed according to the standard protocol. Multiplanar CT image reconstructions were also generated.  Comparison: Radiographs dated 07/26/2012 and CT scan dated 06/05/2012  Findings: There is a large hematoma in the subcutaneous fat of the right buttock measuring approximately 8.5 x 4.3 x 7.5 cm.  The hematoma is incompletely visualized.  There is no fracture or dislocation of the right hip.  Prosthesis appears in good position with no evidence of loosening.  IMPRESSION:  1.  No fracture of the right hip. 2.  Large buttock hematoma.   Original Report Authenticated By: Francene Boyers, M.D.      1. Hip hematoma, right   2. Physical  deconditioning   3. Near syncope      Date: 07/27/2012  Rate: 69  Rhythm: normal sinus rhythm  QRS Axis: normal  Intervals: normal  ST/T Wave abnormalities: nonspecific T wave changes  Conduction Disutrbances:right bundle branch block  Narrative Interpretation:   Old EKG Reviewed: unchanged    MDM   Pt and family educated about findings and need to f/u and return for worsening symptoms. Advised to apply ice to hip and will need increased assistance until pain improves.        Loren Racer, MD 07/27/12 1304

## 2012-07-27 NOTE — ED Notes (Signed)
Per EMS-pt was her yesterday with c/o of fall and hip pain, pt was discharge. Today pt c/o of weakness, states that she went to go walk to the bathroom and felt as if she was going to pass out. Denies LOC, nausea, vomiting.

## 2012-07-27 NOTE — ED Provider Notes (Signed)
Medical screening examination/treatment/procedure(s) were conducted as a shared visit with non-physician practitioner(s) and myself.  I personally evaluated the patient during the encounter. Fall without clear injury. Able to ambulate  Juliet Rude. Rubin Payor, MD 07/27/12 [phone]

## 2012-07-27 NOTE — ED Notes (Signed)
Patient transported to CT 

## 2012-11-11 ENCOUNTER — Non-Acute Institutional Stay (SKILLED_NURSING_FACILITY): Payer: Medicare Other | Admitting: Internal Medicine

## 2012-11-11 DIAGNOSIS — E039 Hypothyroidism, unspecified: Secondary | ICD-10-CM

## 2012-11-11 DIAGNOSIS — D638 Anemia in other chronic diseases classified elsewhere: Secondary | ICD-10-CM

## 2012-11-11 DIAGNOSIS — I10 Essential (primary) hypertension: Secondary | ICD-10-CM

## 2012-11-11 DIAGNOSIS — I2510 Atherosclerotic heart disease of native coronary artery without angina pectoris: Secondary | ICD-10-CM

## 2012-11-12 NOTE — Progress Notes (Signed)
PROGRESS NOTE  DATE: 11/11/12  FACILITY: [facility]  LEVEL OF CARE: Skilled  Routine Visit  CHIEF COMPLAINT:  Manage coronary artery disease, hypertension and anemia  HISTORY OF PRESENT ILLNESS:  REASSESSMENT OF ONGOING PROBLEMS:  1. HTN: Pt 's HTN remains stable.  Denies CP, sob, DOE, pedal edema, headaches, dizziness or visual disturbances.  No complications from the meds currently being used.  Last BP 126/58, 141/63, 125/61.  2. CAD: The angina has been stable. The patient denies dyspnea on exertion, orthopnea,  palpitations and paroxysmal nocturnal dyspnea. No complications noted from the medication presently being used.  3. ANEMIA: The anemia has been stable. The patient denies fatigue, melena or hematochezia. No complications from the medications currently being used.  In 12/13 hemoglobin 10.8, MCV 91.9.   PAST MEDICAL HISTORY : Reviewed.  No changes.  CURRENT MEDICATIONS: Reviewed per [facility]  REVIEW OF SYSTEMS:  GENERAL: no change in appetite, no fatigue, no weight changes, no fever, chills or weakness RESPIRATORY: no cough, SOB, DOE,, wheezing, hemoptysis CARDIAC: no chest pain,  has edema, no palpitations GI: no abdominal pain, diarrhea, constipation, heart burn, nausea or vomiting  PHYSICAL EXAMINATION  VS:  T 97            P 69           RR 18          BP 126/58             POX %                WT (Lb) 170.4  GENERAL: no acute distress, obese body habitus RESPIRATORY: breathing is even & unlabored, BS CTAB CARDIAC: RRR, no murmur,no extra heart sounds, no edema GI: abdomen soft, normal BS, no masses, no tenderness, no hepatomegaly, no splenomegaly PSYCHIATRIC: the patient is alert & oriented to person, affect & behavior appropriate  LABS/RADIOLOGY:  12/13 hemoglobin 10.8, MCV 91.9 otherwise CBC normal, CMP normal, TSH 3.813  ASSESSMENT/PLAN:  1. anemia of chronic illness-stable. 2. Hypertension-well-controlled. 3. CAD-stable. 4. Hypothyroidism-TSH  normal.    CPT CODE: 16109

## 2012-11-25 ENCOUNTER — Non-Acute Institutional Stay (SKILLED_NURSING_FACILITY): Payer: Medicare Other | Admitting: Internal Medicine

## 2012-11-25 DIAGNOSIS — N390 Urinary tract infection, site not specified: Secondary | ICD-10-CM

## 2012-12-06 NOTE — Progress Notes (Signed)
Patient ID: [name] [name], female   DOB: 1918/11/10, 79 y.o.   MRN: [id number]        PROGRESS NOTE  DATE:   11/25/2012     FACILITY:  [facility]   LEVEL OF CARE: SNF  Acute Visit  CHIEF COMPLAINT:  Manage UTI.    HISTORY OF PRESENT ILLNESS: I was requested by the staff to assess the patient regarding above problem(s):  On 11/23/2012, patient's urinalysis showed a small amount of blood, nitrite positive, large leukocyte esterase, WBCs greater than 50, RBCs 0-2.  Urine culture grows E. coli significantly.  Patient today overall is a poor historian.  She is somewhat confused.    PAST MEDICAL HISTORY : Reviewed.  No changes.  CURRENT MEDICATIONS: Reviewed per [facility]  REVIEW OF SYSTEMS:  Unobtainable secondary to confusion.    PHYSICAL EXAMINATION  GENERAL: no acute distress, moderately obese body habitus RESPIRATORY: breathing is even & unlabored, BS CTAB CARDIAC: RRR, no murmur,no extra heart sounds EDEMA/VARICOSITIES:  +1 bilateral lower extremity edema ARTERIAL:  pedal pulses nonpalpable  GI: abdomen soft, normal BS, no masses, no tenderness, no hepatomegaly, no splenomegaly PSYCHIATRIC: the patient is alert & oriented to person, affect & behavior appropriate   ASSESSMENT/PLAN:  UTI.  New problem.  Start nitrofurantoin 100 mg b.i.d. For one week and probiotics b.i.d. For 10 days.  Obtain sensitivities to the E. Coli.    CPT CODE: 40981.

## 2012-12-22 ENCOUNTER — Non-Acute Institutional Stay (SKILLED_NURSING_FACILITY): Payer: Medicare Other | Admitting: Internal Medicine

## 2012-12-22 DIAGNOSIS — I2510 Atherosclerotic heart disease of native coronary artery without angina pectoris: Secondary | ICD-10-CM

## 2012-12-22 DIAGNOSIS — I10 Essential (primary) hypertension: Secondary | ICD-10-CM

## 2012-12-22 DIAGNOSIS — D638 Anemia in other chronic diseases classified elsewhere: Secondary | ICD-10-CM

## 2013-01-05 DIAGNOSIS — D62 Acute posthemorrhagic anemia: Secondary | ICD-10-CM | POA: Insufficient documentation

## 2013-01-05 DIAGNOSIS — I2510 Atherosclerotic heart disease of native coronary artery without angina pectoris: Secondary | ICD-10-CM | POA: Insufficient documentation

## 2013-01-05 DIAGNOSIS — I10 Essential (primary) hypertension: Secondary | ICD-10-CM | POA: Insufficient documentation

## 2013-01-05 NOTE — Progress Notes (Signed)
Patient ID: [name] [name], female   DOB: 01-23-1919, 79 y.o.   MRN: [id number]        PROGRESS NOTE  DATE: 12/22/2012  FACILITY: Nursing Home Location: [facility]  LEVEL OF CARE: SNF (31)  Routine Visit  CHIEF COMPLAINT:  Manage coronary artery disease and hypertension.   HISTORY OF PRESENT ILLNESS:  REASSESSMENT OF ONGOING PROBLEM(S):  CAD: The angina has been stable. The patient denies dyspnea on exertion, orthopnea, pedal edema, palpitations and paroxysmal nocturnal dyspnea. No complications noted from the medication presently being used.   HTN: Pt 's HTN remains stable.  Denies CP, sob, DOE, pedal edema, headaches, dizziness or visual disturbances.  No complications from the medications currently being used.  Last BP : 93/51  PAST MEDICAL HISTORY : Reviewed.  No changes.  CURRENT MEDICATIONS: Reviewed per [facility]  REVIEW OF SYSTEMS:  GENERAL: no change in appetite, no fatigue, no weight changes, no fever, chills or weakness RESPIRATORY: no cough, SOB, DOE, wheezing, hemoptysis CARDIAC: no chest pain, edema or palpitations GI: no abdominal pain, diarrhea, constipation, heart burn, nausea or vomiting  PHYSICAL EXAMINATION  VS:  T 97.8     P 55    RR 20     BP 93/51    POX %     WT (Lb) 172  GENERAL: no acute distress, moderately obese body habitus EYES: conjunctivae normal, sclerae normal, normal eye lids NECK: supple, trachea midline, no neck masses, no thyroid tenderness, no thyromegaly LYMPHATICS: no LAN in the neck, no supraclavicular LAN RESPIRATORY: breathing is even & unlabored, BS CTAB CARDIAC: RRR, no murmur,no extra heart sounds EDEMA/VARICOSITIES:  +1 bilateral lower extremity edema  ARTERIAL:  pedal pulses diminished  GI: abdomen soft, normal BS, no masses, no tenderness, no hepatomegaly, no splenomegaly PSYCHIATRIC: the patient is alert & oriented to person, affect & behavior appropriate  LABS/RADIOLOGY: 07/2012:  Hemoglobin 10.8, MCV  91.9, otherwise CBC normal.   CMP normal.  TSH 3.813.    ASSESSMENT/PLAN:  Hypertension.  Well controlled.   Coronary artery disease.  Stable.   Anemia of chronic disease.  Stable.   CPT CODE: 40981

## 2013-01-25 ENCOUNTER — Non-Acute Institutional Stay (SKILLED_NURSING_FACILITY): Payer: Medicare Other | Admitting: Internal Medicine

## 2013-01-25 DIAGNOSIS — Z79899 Other long term (current) drug therapy: Secondary | ICD-10-CM

## 2013-01-25 DIAGNOSIS — D638 Anemia in other chronic diseases classified elsewhere: Secondary | ICD-10-CM

## 2013-01-25 DIAGNOSIS — I2510 Atherosclerotic heart disease of native coronary artery without angina pectoris: Secondary | ICD-10-CM

## 2013-01-25 DIAGNOSIS — I10 Essential (primary) hypertension: Secondary | ICD-10-CM

## 2013-01-27 DIAGNOSIS — Z79899 Other long term (current) drug therapy: Secondary | ICD-10-CM | POA: Insufficient documentation

## 2013-01-27 NOTE — Progress Notes (Addendum)
Patient ID: [name] [name], female   DOB: 1918-11-27, 79 y.o.   MRN: [id number]        PROGRESS NOTE  DATE: 01/25/2013  FACILITY: Nursing Home Location: [facility]  LEVEL OF CARE: SNF (31)  Routine Visit  CHIEF COMPLAINT:  Manage coronary artery disease and hypertension.   HISTORY OF PRESENT ILLNESS:  REASSESSMENT OF ONGOING PROBLEM(S):  CAD: The angina has been stable. The patient denies dyspnea on exertion, orthopnea, pedal edema, palpitations and paroxysmal nocturnal dyspnea. No complications noted from the medication presently being used.   HTN: Pt 's HTN remains stable.  Denies CP, sob, DOE, pedal edema, headaches, dizziness or visual disturbances.  No complications from the medications currently being used.  Last BP : 104/56, 93/51  PAST MEDICAL HISTORY : Reviewed.  No changes.  CURRENT MEDICATIONS: Reviewed per [facility]  REVIEW OF SYSTEMS:  GENERAL: no change in appetite, no fatigue, no weight changes, no fever, chills or weakness RESPIRATORY: no cough, SOB, DOE, wheezing, hemoptysis CARDIAC: no chest pain, edema or palpitations GI: no abdominal pain, diarrhea, constipation, heart burn, nausea or vomiting  PHYSICAL EXAMINATION  VS:  T 97.4     P 61    RR 20     BP 93/51    POX %     WT (Lb) 174.4  GENERAL: no acute distress, moderately obese body habitus NECK: supple, trachea midline, no neck masses, no thyroid tenderness, no thyromegaly RESPIRATORY: breathing is even & unlabored, BS CTAB CARDIAC: RRR, no murmur,no extra heart sounds EDEMA/VARICOSITIES:  +1 bilateral lower extremity edema  ARTERIAL:  pedal pulses diminished  GI: abdomen soft, normal BS, no masses, no tenderness, no hepatomegaly, no splenomegaly PSYCHIATRIC: the patient is alert & oriented to person, affect & behavior appropriate  LABS/RADIOLOGY: 07/2012:  Hemoglobin 10.8, MCV 91.9, otherwise CBC normal.   CMP normal.  TSH 3.813.    ASSESSMENT/PLAN:  Hypertension.  Well  controlled.   Coronary artery disease.  Stable.   Anemia of chronic disease.  Stable. Check hemoglobin level.  Check CBC and CMP.   CPT CODE: 69629

## 2013-02-01 ENCOUNTER — Non-Acute Institutional Stay (SKILLED_NURSING_FACILITY): Payer: Medicare Other | Admitting: Internal Medicine

## 2013-02-01 DIAGNOSIS — D638 Anemia in other chronic diseases classified elsewhere: Secondary | ICD-10-CM

## 2013-02-01 DIAGNOSIS — N179 Acute kidney failure, unspecified: Secondary | ICD-10-CM

## 2013-02-01 DIAGNOSIS — R739 Hyperglycemia, unspecified: Secondary | ICD-10-CM

## 2013-02-01 DIAGNOSIS — R7309 Other abnormal glucose: Secondary | ICD-10-CM

## 2013-02-21 NOTE — Progress Notes (Signed)
Patient ID: [name] [name], female   DOB: May 11, 1919, 79 y.o.   MRN: [id number]        PROGRESS NOTE  DATE: 02/01/2013  FACILITY:  [facility]  LEVEL OF CARE: SNF (31)  Acute Visit  CHIEF COMPLAINT:  Manage acute renal failure, hyperglycemia, and anemia of chronic disease.    HISTORY OF PRESENT ILLNESS: I was requested by the staff to assess the patient regarding above problem(s):  ACUTE RENAL FAILURE:  On 01/28/2013, patient's BUN was 29, creatinine 1.28.  In 07/2012, BUN was 21, creatinine 1.  Patient denies increasing lower extremity swelling or confusion.  She is  currently on Lasix due to her  chronic lower extremity swelling.    ANEMIA: The anemia has been stable. The patient denies fatigue, melena or hematochezia. The patient is currently not on iron.   On 01/28/2013, hemoglobin was 11.6, MCV 91.8.  In 07/2012, hemoglobin was 10.8.  HYPERGLYCEMIA:  New problem.  The patient does not have a history of diabetes mellitus and she is not on prednisone.  She denies polyuria or polydipsia.    PAST MEDICAL HISTORY : Reviewed.  No changes.  CURRENT MEDICATIONS: Reviewed per [facility]  REVIEW OF SYSTEMS:  GENERAL: no change in appetite, no fatigue, no weight changes, no fever, chills or weakness RESPIRATORY: no cough, SOB, DOE,, wheezing, hemoptysis CARDIAC: no chest pain or palpitations, complains of  chronic lower extremity swelling GI: no abdominal pain, diarrhea, constipation, heart burn, nausea or vomiting  PHYSICAL EXAMINATION  GENERAL: no acute distress, moderately obese body habitus EYES: conjunctivae normal, sclerae normal, normal eye lids NECK: supple, trachea midline, no neck masses, no thyroid tenderness, no thyromegaly LYMPHATICS: no LAN in the neck, no supraclavicular LAN RESPIRATORY: breathing is even & unlabored, BS CTAB CARDIAC: RRR, no murmur,no extra heart sounds EDEMA/VARICOSITIES:  +2 bilateral lower extremity edema  ARTERIAL:  pedal pulses  nonpalpable  GI: abdomen soft, normal BS, no masses, no tenderness, no hepatomegaly, no splenomegaly PSYCHIATRIC: the patient is alert & oriented to person, affect & behavior appropriate  ASSESSMENT/PLAN:  Acute renal failure.  New problem.  Likely secondary to Lasix; but given her lower extremity edema, would not discontinue Lasix at this point.  Reassess renal functions.   Hyperglycemia.  New problem.  Check fasting glucose level and  hemoglobin A1C.   Anemia of chronic disease.  Hemoglobin improved.     CPT CODE: 78295

## 2013-03-01 ENCOUNTER — Non-Acute Institutional Stay (SKILLED_NURSING_FACILITY): Payer: Medicare Other | Admitting: Internal Medicine

## 2013-03-01 DIAGNOSIS — R609 Edema, unspecified: Secondary | ICD-10-CM

## 2013-03-01 DIAGNOSIS — I10 Essential (primary) hypertension: Secondary | ICD-10-CM

## 2013-03-01 DIAGNOSIS — I2510 Atherosclerotic heart disease of native coronary artery without angina pectoris: Secondary | ICD-10-CM

## 2013-03-01 DIAGNOSIS — D638 Anemia in other chronic diseases classified elsewhere: Secondary | ICD-10-CM

## 2013-03-03 DIAGNOSIS — R609 Edema, unspecified: Secondary | ICD-10-CM | POA: Insufficient documentation

## 2013-03-03 NOTE — Progress Notes (Signed)
Patient ID: [name] [name], female   DOB: 1918-09-28, 79 y.o.   MRN: [id number]        PROGRESS NOTE  DATE: 03/01/2013  FACILITY: Nursing Home Location: [facility]  LEVEL OF CARE: SNF (31)  Routine Visit  CHIEF COMPLAINT:  Manage edema, coronary artery disease and hypertension.   HISTORY OF PRESENT ILLNESS:  REASSESSMENT OF ONGOING PROBLEM(S):  CAD: The angina has been stable. The patient denies dyspnea on exertion, orthopnea, pedal edema, palpitations and paroxysmal nocturnal dyspnea. No complications noted from the medication presently being used.   HTN: Pt 's HTN remains stable.  Denies CP, sob, DOE, pedal edema, headaches, dizziness or visual disturbances.  No complications from the medications currently being used.  Last BP : 121/75,104/56, 93/51.  EDEMA: The patient's edema is unstable. Patient c/o increasing lower extremity swelling.  Denies calf pain, chest pain or shortness of breath. No complications reported from the medications currently being used.  PAST MEDICAL HISTORY : Reviewed.  No changes.  CURRENT MEDICATIONS: Reviewed per [facility]  REVIEW OF SYSTEMS:  GENERAL: no change in appetite, no fatigue, no weight changes, no fever, chills or weakness RESPIRATORY: no cough, SOB, DOE, wheezing, hemoptysis CARDIAC: no chest pain, or palpitations, c/o increasing LE swelling GI: no abdominal pain, diarrhea, constipation, heart burn, nausea or vomiting  PHYSICAL EXAMINATION  VS:  T 97.8    P 79    RR 16     BP 121/75    POX %     WT (Lb) 178  GENERAL: no acute distress, moderately obese body habitus EYES: normal sclerae, normal conjunctivae, no discharge NECK: supple, trachea midline, no neck masses, no thyroid tenderness, no thyromegaly LYMPHATICS: no cervical LAN, no supraclavicular LAN RESPIRATORY: breathing is even & unlabored, BS CTAB CARDIAC: RRR, no murmur,no extra heart sounds EDEMA/VARICOSITIES:  +3 bilateral lower extremity edema   ARTERIAL:  pedal pulses diminished  GI: abdomen soft, normal BS, no masses, no tenderness, no hepatomegaly, no splenomegaly PSYCHIATRIC: the patient is alert & oriented to person, affect & behavior appropriate  LABS/RADIOLOGY:  6/14 HbA1c 5.6, Hb 11.6, mcv 91.8 ow cbc nl, glc 140, bun 29, cr 1.28 ow cmp nl 07/2012:  Hemoglobin 10.8, MCV 91.9, otherwise CBC normal.   CMP normal.  TSH 3.813.    ASSESSMENT/PLAN:   Hypertension.  Well controlled.   Coronary artery disease.  Stable.   Anemia of chronic disease.  Stable.  Edema-worsening problem.  Increase lasix to 40mg  qd.  Hypertriglyceridemia-on fish oil.  V58.69-Check bmp in 3 days.  CPT CODE: 40981

## 2013-03-08 ENCOUNTER — Non-Acute Institutional Stay (SKILLED_NURSING_FACILITY): Payer: Medicare Other | Admitting: Internal Medicine

## 2013-03-08 DIAGNOSIS — N179 Acute kidney failure, unspecified: Secondary | ICD-10-CM

## 2013-03-08 NOTE — Progress Notes (Signed)
PROGRESS NOTE  DATE: 03/08/2013  FACILITY:  [facility]  LEVEL OF CARE: SNF (31)  Acute Visit  CHIEF COMPLAINT:  Manage  HISTORY OF PRESENT ILLNESS: I was requested by the staff to assess the patient regarding above problem(s):  ARF: On 7/10 BUN 39, creatinine 1.13. On 6/13 BUN 29, creatinine 1.1, on 6/6 BUN 29, creatinine 1.28. Lasix was increased last week. Patient denies increasing lower extremity swelling or confusion.  PAST MEDICAL HISTORY : Reviewed.  No changes.  CURRENT MEDICATIONS: Reviewed per [facility]  REVIEW OF SYSTEMS:  GENERAL: no change in appetite, no fatigue, no weight changes, no fever, chills or weakness RESPIRATORY: no cough, SOB, DOE,, wheezing, hemoptysis CARDIAC: no chest pain, or palpitations, has chronic lower extreme is swelling GI: no abdominal pain, diarrhea, constipation, heart burn, nausea or vomiting  PHYSICAL EXAMINATION  GENERAL: no acute distress, moderately obese body habitus NECK: supple, trachea midline, no neck masses, no thyroid tenderness, no thyromegaly RESPIRATORY: breathing is even & unlabored, BS CTAB CARDIAC: RRR, no murmur,no extra heart sounds, +2 bilateral lower extremity edema GI: abdomen soft, normal BS, no masses, no tenderness, no hepatomegaly, no splenomegaly PSYCHIATRIC: the patient is alert & oriented to person, affect & behavior appropriate  LABS/RADIOLOGY: See history of present illness  ASSESSMENT/PLAN:  Acute renal failure-worsening problem, likely due to increasing Lasix. We will reassess and if trending up consider decreasing Lasix.  CPT CODE: 16109

## 2013-04-15 ENCOUNTER — Non-Acute Institutional Stay (SKILLED_NURSING_FACILITY): Payer: Medicare Other | Admitting: Nurse Practitioner

## 2013-04-15 ENCOUNTER — Encounter: Payer: Self-pay | Admitting: Nurse Practitioner

## 2013-04-15 DIAGNOSIS — I10 Essential (primary) hypertension: Secondary | ICD-10-CM

## 2013-04-15 DIAGNOSIS — R609 Edema, unspecified: Secondary | ICD-10-CM

## 2013-04-15 DIAGNOSIS — M1990 Unspecified osteoarthritis, unspecified site: Secondary | ICD-10-CM

## 2013-04-15 NOTE — Progress Notes (Signed)
Patient ID: [name] [name], female   DOB: [birth year], 79 y.o.   MRN: [id number]  Nursing Home Location:  [facility]   Place of Service: SNF [phone])  Chief Complaint  Patient presents with  . Medical Managment of Chronic Issues    HPI:  Pt is a 79 year old female who is a LTR of [facility] being seen today for routine follow up. Pt without complaints on today's visit and staff without concerns.  REASSESSMENT OF ONGOING PROBLEMS:   ANEMIA, CHRONIC ILLNESS    Patient denies fatigue melena or hematochezia. Most recent hgb 01/2013 11.6  HYPERTENSION, BENIGN blood pressures are stable The patient denies chest pain, shortness of breath, dyspnea on exertion, pedal edema, or headache. No complications noted from the medication presently being used.  CAD The angina has been stable. The patient denies dyspnea on exertion, orthopnea, pedal edema, palpitations, and paroxysmal nocturnal dyspnea.No complications noted from the medication presently being used.   Review of Systems:  DATA OBTAINED: from patient, nurse, medical record and family member  GENERAL: Feels well no fevers, fatigue, appetite changes SKIN: No itching, rash or wounds  MOUTH/THROAT: No mouth or tooth pain, No sore throat, No difficulty chewing or swallowing  RESPIRATORY: No cough, wheezing, SOB CARDIAC: No chest pain, palpitations, has ongoing lower extremity edema - no worse than baseline  GI: No abdominal pain, No N/V/D or constipation, No heartburn or reflux  GU: No dysuria, frequency or urgency MUSCULOSKELETAL: No unrelieved bone/joint pain-- reports occasional neck pain; PRNs helps  NEUROLOGIC: Awake, alert, appropriate to situation, No change in mental status. Moves all four, no focal deficits PSYCHIATRIC: No overt anxiety or sadness. Sleeps well. No behavior issue.  AMBULATION:  Self propels in WC   Medications: Patient's Medications  New Prescriptions   No medications on file   Previous Medications   ACETAMINOPHEN (TYLENOL) 500 MG TABLET    Take 1,000 mg by mouth every 6 (six) hours as needed. pain   ASPIRIN EC 81 MG TABLET    Take 81 mg by mouth daily.   CLOPIDOGREL (PLAVIX) 75 MG TABLET    Take 75 mg by mouth daily.    DORZOLAMIDE HCL-TIMOLOL MAL PF 22.3-6.8 MG/ML SOLN    Apply 1 drop to eye every 12 (twelve) hours.   FISH OIL-OMEGA-3 FATTY ACIDS 1000 MG CAPSULE    Take 1 g by mouth daily.   FUROSEMIDE (LASIX) 20 MG TABLET    Take 40 mg by mouth daily.    HYDROCODONE-ACETAMINOPHEN (LORTAB) 7.5-500 MG/15ML SOLUTION    Take 5 mLs by mouth every 6 (six) hours as needed for pain.   LATANOPROST (XALATAN) 0.005 % OPHTHALMIC SOLUTION    Place 1 drop into both eyes at bedtime.   METOPROLOL TARTRATE (LOPRESSOR) 25 MG TABLET    Take 25 mg by mouth 2 (two) times daily.   MULTIPLE VITAMINS-MINERALS (ICAPS) TABS    Take 1 tablet by mouth daily.   TRAMADOL (ULTRAM) 50 MG TABLET    Take 1 tablet (50 mg total) by mouth every 6 (six) hours as needed for pain.  Modified Medications   No medications on file  Discontinued Medications   FERROUS FUMARATE (HEMOCYTE - 106 MG FE) 325 (106 FE) MG TABS    Take 1 tablet (106 mg of iron total) by mouth 2 (two) times daily.     Physical Exam:  Filed Vitals:   04/15/13 1527  BP: 118/70  Pulse: 68  Temp:  97.9 F (36.6 C)  Resp: 18  Weight: 179 lb (81.194 kg)    GENERAL APPEARANCE: Alert, conversant. Appropriately groomed. No acute distress.  SKIN: No diaphoresis rash, or wounds HEAD: Normocephalic, atraumatic  EYES: Conjunctiva/lids clear. Pupils round, reactive. EOMs intact.  EARS: External exam WNL, canals clear. Hearing grossly normal.  NOSE: No deformity or discharge.  MOUTH/THROAT: Lips w/o lesions. Mouth and throat normal. Tongue moist, w/o lesion.  NECK: No thyroid tenderness, enlargement or nodule  RESPIRATORY: Breathing is even, unlabored. Lung sounds are clear   CARDIOVASCULAR: Heart RRR no murmurs, rubs or gallops.  2+ peripheral edema.  ARTERIAL: radial pulse 2+, DP pulse 1+  GASTROINTESTINAL: Abdomen is soft, non-tender, not distended w/ normal bowel sounds.GENITOURINARY: Bladder non tender, not distended  MUSCULOSKELETAL: No abnormal joints or musculature NEUROLOGIC: Oriented X3. Cranial nerves 2-12 grossly intact. Moves all extremities no tremor. PSYCHIATRIC: Mood and affect appropriate to situation, no behavioral issues  Labs reviewed/Significant Diagnostic Results reviewed via chart  Assessment/Plan 1. Essential hypertension, benign Stable on current medications   2. Edema Stable; no worsening edema  3. OA (osteoarthritis) Stable; PRNs help with pain

## 2013-05-17 ENCOUNTER — Non-Acute Institutional Stay (SKILLED_NURSING_FACILITY): Payer: Medicare Other | Admitting: Internal Medicine

## 2013-05-17 DIAGNOSIS — D638 Anemia in other chronic diseases classified elsewhere: Secondary | ICD-10-CM

## 2013-05-17 DIAGNOSIS — I10 Essential (primary) hypertension: Secondary | ICD-10-CM

## 2013-05-17 DIAGNOSIS — R609 Edema, unspecified: Secondary | ICD-10-CM

## 2013-05-17 DIAGNOSIS — I2510 Atherosclerotic heart disease of native coronary artery without angina pectoris: Secondary | ICD-10-CM

## 2013-05-17 NOTE — Progress Notes (Signed)
Patient ID: [name] [name], female   DOB: 1919/05/07, 79 y.o.   MRN: [id number]        PROGRESS NOTE  DATE: 05/17/2013  FACILITY: Nursing Home Location: [facility]  LEVEL OF CARE: SNF (31)  Routine Visit  CHIEF COMPLAINT:  Manage edema, coronary artery disease and hypertension.   HISTORY OF PRESENT ILLNESS:  REASSESSMENT OF ONGOING PROBLEM(S):  CAD: The angina has been stable. The patient denies dyspnea on exertion, orthopnea, palpitations and paroxysmal nocturnal dyspnea. No complications noted from the medication presently being used. Complains of lower extremity swelling  HTN: Pt 's HTN remains stable.  Denies CP, sob, DOE, pedal edema, headaches, dizziness or visual disturbances.  No complications from the medications currently being used.  Last BP : 121/75,104/56, 93/51, 100/52.  EDEMA: The patient's edema is stable. Patient denies increasing lower extremity swelling.  Denies calf pain, chest pain or shortness of breath. No complications reported from the medications currently being used.  PAST MEDICAL HISTORY : Reviewed.  No changes.  CURRENT MEDICATIONS: Reviewed per [facility]  REVIEW OF SYSTEMS:  GENERAL: no change in appetite, no fatigue, no weight changes, no fever, chills or weakness RESPIRATORY: no cough, SOB, DOE, wheezing, hemoptysis CARDIAC: no chest pain, or palpitations, c/o  LE swelling GI: no abdominal pain, diarrhea, constipation, heart burn, nausea or vomiting  PHYSICAL EXAMINATION  VS:  T 97.9  P 60    RR 18     BP 100/52    POX %     WT (Lb) 178.8  GENERAL: no acute distress, moderately obese body habitus NECK: supple, trachea midline, no neck masses, no thyroid tenderness, no thyromegaly RESPIRATORY: breathing is even & unlabored, BS CTAB CARDIAC: RRR, no murmur,no extra heart sounds EDEMA/VARICOSITIES:  +1 bilateral lower extremity edema  ARTERIAL:  pedal pulses diminished  GI: abdomen soft, normal BS, no masses, no tenderness, no  hepatomegaly, no splenomegaly PSYCHIATRIC: the patient is alert & oriented to person, affect & behavior appropriate  LABS/RADIOLOGY: 7-14 BUN 29, creatinine 1.11  6/14 HbA1c 5.6, Hb 11.6, mcv 91.8 ow cbc nl, glc 140, bun 29, cr 1.28 ow cmp nl 07/2012:  Hemoglobin 10.8, MCV 91.9, otherwise CBC normal.   CMP normal.  TSH 3.813.    ASSESSMENT/PLAN:   Hypertension.  Well controlled.   Coronary artery disease.  Stable.   Anemia of chronic disease.  Stable.  Edema-improved  Hypertriglyceridemia-on fish oil.  CPT CODE: 62130

## 2013-05-26 ENCOUNTER — Non-Acute Institutional Stay (SKILLED_NURSING_FACILITY): Payer: Medicare Other | Admitting: Family

## 2013-05-26 DIAGNOSIS — I10 Essential (primary) hypertension: Secondary | ICD-10-CM

## 2013-05-26 DIAGNOSIS — M1990 Unspecified osteoarthritis, unspecified site: Secondary | ICD-10-CM

## 2013-05-26 DIAGNOSIS — I2510 Atherosclerotic heart disease of native coronary artery without angina pectoris: Secondary | ICD-10-CM

## 2013-05-26 NOTE — Progress Notes (Signed)
Patient ID: [name] [name], female   DOB: [birth year], 79 y.o.   MRN: [id number] Date: 05/26/13  Facility: [facility]  CC: Abdominal Pain/Routine Visit  HPI: Nursing staff reports pt c/o abdominal pain/discomfort.  Pt reports abdominal pain has spontaneously resolved. Pt and nursing staff denies further concerns and issues at present. Pt is being followed for the medical management of chronic illnesses.       Allergies  Allergen Reactions  . Codeine Nausea And Vomiting  . Morphine And Related Nausea And Vomiting  . Penicillins Hives     Medication List       This list is accurate as of: 05/26/13 11:59 PM.  Always use your most recent med list.               acetaminophen 500 MG tablet  Commonly known as:  TYLENOL  Take 1,000 mg by mouth every 6 (six) hours as needed. pain     aspirin EC 81 MG tablet  Take 81 mg by mouth daily.     clopidogrel 75 MG tablet  Commonly known as:  PLAVIX  Take 75 mg by mouth daily.     Dorzolamide HCl-Timolol Mal PF 22.3-6.8 MG/ML Soln  Apply 1 drop to eye every 12 (twelve) hours.     fish oil-omega-3 fatty acids 1000 MG capsule  Take 1 g by mouth daily.     furosemide 20 MG tablet  Commonly known as:  LASIX  Take 40 mg by mouth daily.     HYDROcodone-acetaminophen 7.5-500 MG/15ML solution  Commonly known as:  LORTAB  Take 5 mLs by mouth every 6 (six) hours as needed for pain.     ICAPS Tabs  Take 1 tablet by mouth daily.     latanoprost 0.005 % ophthalmic solution  Commonly known as:  XALATAN  Place 1 drop into both eyes at bedtime.     metoprolol tartrate 25 MG tablet  Commonly known as:  LOPRESSOR  Take 25 mg by mouth 2 (two) times daily.     traMADol 50 MG tablet  Commonly known as:  ULTRAM  Take 1 tablet (50 mg total) by mouth every 6 (six) hours as needed for pain.         DATA REVIEWED   Laboratory Studies:7/17  BUN 29, Creatinine 1.11 7/10 Na 138, Potassium 4.5, Chloride 100, Glucose 99, Calcium 9.6     Past  Medical History  Diagnosis Date  . Hypertension   . Cataract     both eyes  . Heart attack 2001    stents put in  . Arthritis   . Glaucoma   . Poor circulation     Past Surgical History  Procedure Laterality Date  . Abdominal hysterectomy  1972  . Eye surgery  2007/2008    cataracts  . Cardiac surgery  2001    stent placement  . Hernia repair  2009  . Gallbladder surgery  2009  . Hip arthroplasty  06/06/2012    Procedure: ARTHROPLASTY BIPOLAR HIP;  Surgeon: Eldred Manges, MD;  Location: [facility];  Service: Orthopedics;  Laterality: Right;  monopolar   History   Social History  . Marital Status: Married    Spouse Name: N/A    Number of Children: N/A  . Years of Education: N/A   Occupational History  . Not on file.   Social History Main Topics  . Smoking status: Never Smoker   . Smokeless tobacco: Never Used  . Alcohol Use: No  .  Drug Use: No  . Sexual Activity: No   Other Topics Concern  . Not on file   Social History Narrative  . No narrative on file   Review of Systems  Constitutional: Negative.   HENT: Negative.   Eyes: Negative.   Respiratory: Negative.   Cardiovascular: Positive for leg swelling.       Stent placed 2001 s/p MI  Gastrointestinal: Positive for abdominal pain.  Genitourinary: Negative.   Musculoskeletal: Positive for joint pain.  Skin: Negative.   Neurological: Negative.   Endo/Heme/Allergies: Negative.   Psychiatric/Behavioral: Negative.      Physical Exam Filed Vitals:   05/26/13 1702  BP: 111/56  Pulse: 70  Temp: 98 F (36.7 C)  Resp: 17   There is no weight on file to calculate BMI. Physical Exam  Constitutional: She is oriented to person, place, and time. No distress.  HENT:  Head: Normocephalic.  Mouth/Throat: Oropharynx is clear and moist.  Cardiovascular: Normal rate and regular rhythm.   Varicosities to BLE, 1+ edema to BLE  Pulmonary/Chest: Effort normal and breath sounds normal.  Abdominal: Soft. Normal  appearance and bowel sounds are normal.  Neurological: She is alert and oriented to person, place, and time.    ASSESSMENT/PLAN Hypertension-Pt is currently stable on current treatment plan; will continue medication regimen CAD-Pt is stable on current treatment plan; will continue medication regimen Arthritis-Pt is stable/ will continue to collaborate with health care team to maintain stablility  Follow up: PRN

## 2013-06-07 ENCOUNTER — Non-Acute Institutional Stay (SKILLED_NURSING_FACILITY): Payer: Medicare Other | Admitting: Internal Medicine

## 2013-06-07 DIAGNOSIS — I2510 Atherosclerotic heart disease of native coronary artery without angina pectoris: Secondary | ICD-10-CM

## 2013-06-07 DIAGNOSIS — D638 Anemia in other chronic diseases classified elsewhere: Secondary | ICD-10-CM

## 2013-06-07 DIAGNOSIS — R609 Edema, unspecified: Secondary | ICD-10-CM

## 2013-06-07 DIAGNOSIS — I10 Essential (primary) hypertension: Secondary | ICD-10-CM

## 2013-06-11 NOTE — Progress Notes (Signed)
Patient ID: [name] [name], female   DOB: 1919/06/27, 79 y.o.   MRN: [id number]        PROGRESS NOTE  DATE: 06/07/2013  FACILITY: Nursing Home Location: [facility]  LEVEL OF CARE: SNF (31)  Routine Visit  CHIEF COMPLAINT:  Manage edema, coronary artery disease and hypertension.   HISTORY OF PRESENT ILLNESS:  REASSESSMENT OF ONGOING PROBLEM(S):  CAD: The angina has been stable. The patient denies dyspnea on exertion, orthopnea, palpitations and paroxysmal nocturnal dyspnea. No complications noted from the medication presently being used. Complains of lower extremity swelling  HTN: Pt 's HTN remains stable.  Denies CP, sob, DOE, pedal edema, headaches, dizziness or visual disturbances.  No complications from the medications currently being used.  Last BP : 121/75,104/56, 93/51, 100/52.  EDEMA: The patient's edema is stable. Patient denies increasing lower extremity swelling.  Denies calf pain, chest pain or shortness of breath. No complications reported from the medications currently being used.  PAST MEDICAL HISTORY : Reviewed.  No changes.  CURRENT MEDICATIONS: Reviewed per [facility]  REVIEW OF SYSTEMS:  GENERAL: no change in appetite, no fatigue, no weight changes, no fever, chills or weakness RESPIRATORY: no cough, SOB, DOE, wheezing, hemoptysis CARDIAC: no chest pain, or palpitations, c/o  LE swelling GI: no abdominal pain, diarrhea, constipation, heart burn, nausea or vomiting  PHYSICAL EXAMINATION  VS:  T 97.9  P 64    RR 20     BP 100/52    POX %     WT (Lb) 173.8  GENERAL: no acute distress, moderately obese body habitus NECK: supple, trachea midline, no neck masses, no thyroid tenderness, no thyromegaly RESPIRATORY: breathing is even & unlabored, BS CTAB CARDIAC: RRR, no murmur,no extra heart sounds EDEMA/VARICOSITIES:  +1 bilateral lower extremity edema  ARTERIAL:  pedal pulses diminished  GI: abdomen soft, normal BS, no masses, no tenderness, no  hepatomegaly, no splenomegaly PSYCHIATRIC: the patient is alert & oriented to person, affect & behavior appropriate  LABS/RADIOLOGY: 7-14 BUN 29, creatinine 1.11  6/14 HbA1c 5.6, Hb 11.6, mcv 91.8 ow cbc nl, glc 140, bun 29, cr 1.28 ow cmp nl 07/2012:  Hemoglobin 10.8, MCV 91.9, otherwise CBC normal.   CMP normal.  TSH 3.813.    ASSESSMENT/PLAN:   Hypertension.  Well controlled.   Coronary artery disease.  Stable.   Anemia of chronic disease.  Stable.  Edema-improved  Hypertriglyceridemia-on fish oil.  CPT CODE: 40981

## 2013-07-11 ENCOUNTER — Non-Acute Institutional Stay (SKILLED_NURSING_FACILITY): Payer: Medicare Other | Admitting: Internal Medicine

## 2013-07-11 ENCOUNTER — Encounter: Payer: Self-pay | Admitting: Internal Medicine

## 2013-07-11 DIAGNOSIS — D638 Anemia in other chronic diseases classified elsewhere: Secondary | ICD-10-CM

## 2013-07-11 DIAGNOSIS — I2510 Atherosclerotic heart disease of native coronary artery without angina pectoris: Secondary | ICD-10-CM

## 2013-07-11 DIAGNOSIS — R609 Edema, unspecified: Secondary | ICD-10-CM

## 2013-07-11 DIAGNOSIS — I10 Essential (primary) hypertension: Secondary | ICD-10-CM

## 2013-07-11 NOTE — Progress Notes (Signed)
Patient ID: [name] [name], female   DOB: 1919/03/24, 79 y.o.   MRN: [id number]        PROGRESS NOTE  DATE: 07/11/2013  FACILITY: Nursing Home Location: [facility]  LEVEL OF CARE: SNF (31)  Routine Visit  CHIEF COMPLAINT:  Manage edema, coronary artery disease and hypertension.   HISTORY OF PRESENT ILLNESS:  REASSESSMENT OF ONGOING PROBLEM(S):  CAD: The angina has been stable. The patient denies dyspnea on exertion, orthopnea, palpitations and paroxysmal nocturnal dyspnea. No complications noted from the medication presently being used. Complains of lower extremity swelling  HTN: Pt 's HTN remains stable.  Denies CP, sob, DOE, pedal edema, headaches, dizziness or visual disturbances.  No complications from the medications currently being used.  Last BP : 121/75,104/56, 93/51, 100/52, 98/63.  EDEMA: The patient's edema is stable. Patient denies increasing lower extremity swelling.  Denies calf pain, chest pain or shortness of breath. No complications reported from the medications currently being used.  PAST MEDICAL HISTORY : Reviewed.  No changes.  CURRENT MEDICATIONS: Reviewed per [facility]  REVIEW OF SYSTEMS:  GENERAL: no change in appetite, no fatigue, no weight changes, no fever, chills or weakness RESPIRATORY: no cough, SOB, DOE, wheezing, hemoptysis CARDIAC: no chest pain, or palpitations, c/o  LE swelling GI: no abdominal pain, diarrhea, constipation, heart burn, nausea or vomiting  PHYSICAL EXAMINATION  VS:  T 97.4  P 58   RR 18     BP 98/63    POX %     WT (Lb) 172.2  GENERAL: no acute distress, moderately obese body habitus NECK: supple, trachea midline, no neck masses, no thyroid tenderness, no thyromegaly RESPIRATORY: breathing is even & unlabored, BS CTAB CARDIAC: RRR, no murmur,no extra heart sounds EDEMA/VARICOSITIES:  +1 bilateral lower extremity edema  ARTERIAL:  pedal pulses diminished  GI: abdomen soft, normal BS, no masses, no  tenderness, no hepatomegaly, no splenomegaly PSYCHIATRIC: the patient is alert & oriented to person, affect & behavior appropriate  LABS/RADIOLOGY: 7-14 BUN 29, creatinine 1.11  6/14 HbA1c 5.6, Hb 11.6, mcv 91.8 ow cbc nl, glc 140, bun 29, cr 1.28 ow cmp nl 07/2012:  Hemoglobin 10.8, MCV 91.9, otherwise CBC normal.   CMP normal.  TSH 3.813.    ASSESSMENT/PLAN:   Hypertension.  Well controlled.   Coronary artery disease.  Stable.   Anemia of chronic disease.  Stable.  Edema-improved  Hypertriglyceridemia-on fish oil.  CPT CODE: 65784

## 2013-07-18 ENCOUNTER — Non-Acute Institutional Stay (SKILLED_NURSING_FACILITY): Payer: Medicare Other | Admitting: Internal Medicine

## 2013-07-18 DIAGNOSIS — R609 Edema, unspecified: Secondary | ICD-10-CM

## 2013-08-26 ENCOUNTER — Encounter: Payer: Self-pay | Admitting: Internal Medicine

## 2013-08-26 NOTE — Progress Notes (Signed)
Patient ID: [name] [name], female   DOB: [birth year], 80 y.o.   MRN: [id number]        PROGRESS NOTE  DATE: 07/18/2013    [facility]  LEVEL OF CARE: SNF (31)  Acute Visit  CHIEF COMPLAINT:  Manage left forearm edema.    HISTORY OF PRESENT ILLNESS: I was requested by the staff to assess the patient regarding above problem(s):  Staff report that patient sustained a skin tear to her left forearm after a fall.  However, the skin tear area has become swollen and extremely painful to touch.  X-ray of the area does not show any acute findings.    PAST MEDICAL HISTORY : Reviewed.  No changes.  [facility]  REVIEW OF SYSTEMS:  GENERAL: no change in appetite, no fatigue, no weight changes, no fever, chills or weakness RESPIRATORY: no cough, SOB, DOE,, wheezing, hemoptysis CARDIAC: no chest pain, edema or palpitations GI: no abdominal pain, diarrhea, constipation, heart burn, nausea or vomiting  PHYSICAL EXAMINATION  VS:  T 97.9       P 58      RR 20      BP 128/62     POX %       WT (Lb)  GENERAL: no acute distress, moderately obese body habitus NECK: supple, trachea midline, no neck masses, no thyroid tenderness, no thyromegaly RESPIRATORY: breathing is even & unlabored, BS CTAB CARDIAC: RRR, no murmur,no extra heart sounds, no edema GI: abdomen soft, normal BS, no masses, no tenderness, no hepatomegaly, no splenomegaly PSYCHIATRIC: the patient is alert & oriented to person, affect & behavior appropriate SKIN:  INSPECTION: left forearm skin tear is clean without any purulent discharge, there is surrounding edema and exquisite tenderness to palpation, there is no warmth    ASSESSMENT/PLAN:  Left forearm edema.  New problem.  No sign of infection on exam.  Likely a hematoma.   We will hold aspirin and Plavix for 10 days.  Continue local treatment.    [facility] Metrics:   Aspirin 81 mg q.d.  No tobacco use.    CPT CODE: [id number]

## 2013-09-08 ENCOUNTER — Encounter: Payer: Self-pay | Admitting: *Deleted

## 2013-09-12 ENCOUNTER — Non-Acute Institutional Stay (SKILLED_NURSING_FACILITY): Payer: Medicare Other | Admitting: Internal Medicine

## 2013-09-12 DIAGNOSIS — I10 Essential (primary) hypertension: Secondary | ICD-10-CM

## 2013-09-12 DIAGNOSIS — D638 Anemia in other chronic diseases classified elsewhere: Secondary | ICD-10-CM

## 2013-09-12 DIAGNOSIS — R609 Edema, unspecified: Secondary | ICD-10-CM

## 2013-09-12 DIAGNOSIS — I2510 Atherosclerotic heart disease of native coronary artery without angina pectoris: Secondary | ICD-10-CM

## 2013-09-16 NOTE — Progress Notes (Signed)
Patient ID: [name] [name], female   DOB: 1919-06-06, 80 y.o.   MRN: [id number]         PROGRESS NOTE  DATE: 09/12/2013  FACILITY: Nursing Home Location: [facility]  LEVEL OF CARE: SNF (31)  Routine Visit  CHIEF COMPLAINT:  Manage edema, coronary artery disease and hypertension.   HISTORY OF PRESENT ILLNESS:  REASSESSMENT OF ONGOING PROBLEM(S):  CAD: The angina has been stable. The patient denies dyspnea on exertion, orthopnea, palpitations and paroxysmal nocturnal dyspnea. No complications noted from the medication presently being used. Complains of lower extremity swelling  HTN: Pt 's HTN remains stable.  Denies CP, sob, DOE, pedal edema, headaches, dizziness or visual disturbances.  No complications from the medications currently being used.  Last BP : 121/75,104/56, 93/51, 100/52, 98/63, 112/58.  EDEMA: The patient's edema is stable. Patient denies increasing lower extremity swelling.  Denies calf pain, chest pain or shortness of breath. No complications reported from the medications currently being used.  PAST MEDICAL HISTORY : Reviewed.  No changes.  CURRENT MEDICATIONS: [facility]  REVIEW OF SYSTEMS:  GENERAL: no change in appetite, no fatigue, no weight changes, no fever, chills or weakness RESPIRATORY: no cough, SOB, DOE, wheezing, hemoptysis CARDIAC: no chest pain, or palpitations, c/o  LE swelling GI: no abdominal pain, diarrhea, constipation, heart burn, nausea or vomiting  PHYSICAL EXAMINATION  VS:  T 97.4  P 72   RR 20     BP 112/58   POX %     WT (Lb) 174.6  GENERAL: no acute distress, moderately obese body habitus NECK: supple, trachea midline, no neck masses, no thyroid tenderness, no thyromegaly RESPIRATORY: breathing is even & unlabored, BS CTAB CARDIAC: RRR, no murmur,no extra heart sounds EDEMA/VARICOSITIES:  +1 bilateral lower extremity edema  ARTERIAL:  pedal pulses diminished  GI: abdomen soft, normal BS, no masses, no  tenderness, no hepatomegaly, no splenomegaly PSYCHIATRIC: the patient is alert & oriented to person, affect & behavior appropriate  LABS/RADIOLOGY: 7-14 BUN 29, creatinine 1.11  6/14 HbA1c 5.6, Hb 11.6, mcv 91.8 ow cbc nl, glc 140, bun 29, cr 1.28 ow cmp nl 07/2012:  Hemoglobin 10.8, MCV 91.9, otherwise CBC normal.   CMP normal.  TSH 3.813.    ASSESSMENT/PLAN:   Hypertension.  Well controlled.   Coronary artery disease.  Stable.   Anemia of chronic disease.  Stable. Recheck  Edema-improved  Hypertriglyceridemia-on fish oil.  Check CBC and CMP  CPT CODE: [id number]

## 2013-10-03 ENCOUNTER — Non-Acute Institutional Stay (SKILLED_NURSING_FACILITY): Payer: Medicare Other | Admitting: Internal Medicine

## 2013-10-03 DIAGNOSIS — R609 Edema, unspecified: Secondary | ICD-10-CM

## 2013-10-03 DIAGNOSIS — I2510 Atherosclerotic heart disease of native coronary artery without angina pectoris: Secondary | ICD-10-CM

## 2013-10-03 DIAGNOSIS — I10 Essential (primary) hypertension: Secondary | ICD-10-CM

## 2013-10-03 DIAGNOSIS — M1990 Unspecified osteoarthritis, unspecified site: Secondary | ICD-10-CM | POA: Insufficient documentation

## 2013-10-03 NOTE — Progress Notes (Signed)
Patient ID: [name] [name], female   DOB: 1919-03-18, 80 y.o.   MRN: [id number]          PROGRESS NOTE  DATE: 10/03/2013  FACILITY: Nursing Home Location: [facility]  LEVEL OF CARE: SNF (31)  Routine Visit  CHIEF COMPLAINT:  Manage edema, coronary artery disease and hypertension.   HISTORY OF PRESENT ILLNESS:  REASSESSMENT OF ONGOING PROBLEM(S):  CAD: The angina has been stable. The patient denies dyspnea on exertion, orthopnea, palpitations and paroxysmal nocturnal dyspnea. No complications noted from the medication presently being used. Complains of lower extremity swelling  HTN: Pt 's HTN remains stable.  Denies CP, sob, DOE, pedal edema, headaches, dizziness or visual disturbances.  No complications from the medications currently being used.  Last BP : 121/75,104/56, 93/51, 100/52, 98/63, 112/58, 112/54.  EDEMA: The patient's edema is stable. Patient denies increasing lower extremity swelling.  Denies calf pain, chest pain or shortness of breath. No complications reported from the medications currently being used.  PAST MEDICAL HISTORY : Reviewed.  No changes.  [facility]: [facility]  REVIEW OF SYSTEMS:  GENERAL: no change in appetite, no fatigue, no weight changes, no fever, chills or weakness RESPIRATORY: no cough, SOB, DOE, wheezing, hemoptysis CARDIAC: no chest pain, or palpitations, c/o  LE swelling GI: no abdominal pain, diarrhea, constipation, heart burn, nausea or vomiting  PHYSICAL EXAMINATION  VS:  T 97.1   P 62   RR 20     BP 112/54    WT (Lb) 172.4.   GENERAL: no acute distress, moderately obese body habitus EYES: Normal sclerae, normal conjunctivae, discharge NECK: supple, trachea midline, no neck masses, no thyroid tenderness, no thyromegaly LYMPHATICS: No cervical lymphadenopathy, no supraclavicular lymphadenopathy RESPIRATORY: breathing is even & unlabored, BS CTAB CARDIAC: RRR, no murmur,no extra heart  sounds EDEMA/VARICOSITIES:  +1 bilateral lower extremity edema  ARTERIAL:  pedal pulses diminished  GI: abdomen soft, normal BS, no masses, no tenderness, no hepatomegaly, no splenomegaly PSYCHIATRIC: the patient is alert & oriented to person, affect & behavior appropriate  LABS/RADIOLOGY:  1-15 CO2 34, BUN 26, albumin 3.2 otherwise CMP normal, CBC normal 7-14 BUN 29, creatinine 1.11  6/14 HbA1c 5.6, Hb 11.6, mcv 91.8 ow cbc nl, glc 140, bun 29, cr 1.28 ow cmp nl 07/2012:  Hemoglobin 10.8, MCV 91.9, otherwise CBC normal.   CMP normal.  TSH 3.813.    ASSESSMENT/PLAN:   Hypertension.  Well controlled.   Coronary artery disease.  Stable.   Edema-improved  Hypertriglyceridemia-on fish oil.  Osteoarthritis-unstable problem. Patient is complaining of arthritic pain. Start extra strength Tylenol thousand milligrams twice a day  CPT CODE: [id number]

## 2013-10-26 ENCOUNTER — Encounter (HOSPITAL_COMMUNITY): Payer: Self-pay | Admitting: Nurse Practitioner

## 2013-10-26 ENCOUNTER — Observation Stay (HOSPITAL_COMMUNITY)
Admission: EM | Admit: 2013-10-26 | Discharge: 2013-10-27 | Disposition: A | Discharge status: Home / Self Care | Payer: Medicare Other | Attending: Cardiology | Admitting: Cardiology

## 2013-10-26 ENCOUNTER — Emergency Department (HOSPITAL_COMMUNITY): Payer: Medicare Other

## 2013-10-26 ENCOUNTER — Encounter: Payer: Self-pay | Admitting: Internal Medicine

## 2013-10-26 ENCOUNTER — Encounter (HOSPITAL_COMMUNITY): Payer: Self-pay | Admitting: Emergency Medicine

## 2013-10-26 ENCOUNTER — Non-Acute Institutional Stay (SKILLED_NURSING_FACILITY): Payer: Medicare Other | Admitting: Internal Medicine

## 2013-10-26 DIAGNOSIS — I2510 Atherosclerotic heart disease of native coronary artery without angina pectoris: Secondary | ICD-10-CM

## 2013-10-26 DIAGNOSIS — M1990 Unspecified osteoarthritis, unspecified site: Secondary | ICD-10-CM | POA: Diagnosis not present

## 2013-10-26 DIAGNOSIS — R1013 Epigastric pain: Secondary | ICD-10-CM | POA: Diagnosis not present

## 2013-10-26 DIAGNOSIS — Z79899 Other long term (current) drug therapy: Secondary | ICD-10-CM | POA: Diagnosis not present

## 2013-10-26 DIAGNOSIS — E781 Pure hyperglyceridemia: Secondary | ICD-10-CM | POA: Diagnosis not present

## 2013-10-26 DIAGNOSIS — I999 Unspecified disorder of circulatory system: Secondary | ICD-10-CM | POA: Insufficient documentation

## 2013-10-26 DIAGNOSIS — Z7902 Long term (current) use of antithrombotics/antiplatelets: Secondary | ICD-10-CM | POA: Insufficient documentation

## 2013-10-26 DIAGNOSIS — I10 Essential (primary) hypertension: Secondary | ICD-10-CM | POA: Diagnosis not present

## 2013-10-26 DIAGNOSIS — Z880 Allergy status to penicillin: Secondary | ICD-10-CM | POA: Insufficient documentation

## 2013-10-26 DIAGNOSIS — R079 Chest pain, unspecified: Secondary | ICD-10-CM | POA: Diagnosis present

## 2013-10-26 DIAGNOSIS — E039 Hypothyroidism, unspecified: Secondary | ICD-10-CM | POA: Insufficient documentation

## 2013-10-26 DIAGNOSIS — R110 Nausea: Secondary | ICD-10-CM | POA: Insufficient documentation

## 2013-10-26 DIAGNOSIS — Z888 Allergy status to other drugs, medicaments and biological substances status: Secondary | ICD-10-CM | POA: Diagnosis not present

## 2013-10-26 DIAGNOSIS — Z7982 Long term (current) use of aspirin: Secondary | ICD-10-CM | POA: Diagnosis not present

## 2013-10-26 DIAGNOSIS — H409 Unspecified glaucoma: Secondary | ICD-10-CM | POA: Insufficient documentation

## 2013-10-26 DIAGNOSIS — H269 Unspecified cataract: Secondary | ICD-10-CM | POA: Diagnosis not present

## 2013-10-26 DIAGNOSIS — R0789 Other chest pain: Secondary | ICD-10-CM | POA: Diagnosis not present

## 2013-10-26 DIAGNOSIS — D691 Qualitative platelet defects: Secondary | ICD-10-CM | POA: Diagnosis not present

## 2013-10-26 HISTORY — DX: Pure hyperglyceridemia: E78.1

## 2013-10-26 HISTORY — DX: Unspecified osteoarthritis, unspecified site: M19.90

## 2013-10-26 LAB — CBC
HCT: 37.9 % (ref 36.0–46.0)
HEMOGLOBIN: 12.7 g/dL (ref 12.0–15.0)
MCH: 32.8 pg (ref 26.0–34.0)
MCHC: 33.5 g/dL (ref 30.0–36.0)
MCV: 97.9 fL (ref 78.0–100.0)
Platelets: 285 10*3/uL (ref 150–400)
RBC: 3.87 MIL/uL (ref 3.87–5.11)
RDW: 13.1 % (ref 11.5–15.5)
WBC: 9.8 10*3/uL (ref 4.0–10.5)

## 2013-10-26 LAB — BASIC METABOLIC PANEL
BUN: 25 mg/dL — ABNORMAL HIGH (ref 6–23)
CO2: 32 mEq/L (ref 19–32)
Calcium: 10.1 mg/dL (ref 8.4–10.5)
Chloride: 96 mEq/L (ref 96–112)
Creatinine, Ser: 1.07 mg/dL (ref 0.50–1.10)
GFR calc Af Amer: 51 mL/min — ABNORMAL LOW (ref >90)
GFR, EST NON AFRICAN AMERICAN: 44 mL/min — AB (ref >90)
GLUCOSE: 92 mg/dL (ref 70–99)
POTASSIUM: 4.8 meq/L (ref 3.7–5.3)
SODIUM: 139 meq/L (ref 137–147)

## 2013-10-26 LAB — I-STAT TROPONIN, ED: Troponin i, poc: 0.01 ng/mL (ref 0.00–0.08)

## 2013-10-26 LAB — TROPONIN I: Troponin I: <0.3 ng/mL (ref <0.30)

## 2013-10-26 LAB — TSH: TSH: 6.149 u[IU]/mL — AB (ref 0.350–4.500)

## 2013-10-26 MED ORDER — HEPARIN SODIUM (PORCINE) 5000 UNIT/ML IJ SOLN
5000.0000 [IU] | Freq: Three times a day (TID) | INTRAMUSCULAR | Status: DC
  Filled 2013-10-26 (×5): qty 1

## 2013-10-26 MED ORDER — NITROGLYCERIN 0.4 MG SL SUBL: 0.4000 mg | SUBLINGUAL_TABLET | SUBLINGUAL | Status: DC | PRN

## 2013-10-26 MED ORDER — DORZOLAMIDE HCL-TIMOLOL MAL 2-0.5 % OP SOLN
1.0000 [drp] | Freq: Two times a day (BID) | OPHTHALMIC | Status: DC
  Administered 2013-10-27: 1 [drp] via OPHTHALMIC
  Filled 2013-10-26: qty 10

## 2013-10-26 MED ORDER — CLOPIDOGREL BISULFATE 75 MG PO TABS
75.0000 mg | ORAL_TABLET | Freq: Every day | ORAL | Status: DC
  Administered 2013-10-26 – 2013-10-27 (×2): 75 mg via ORAL
  Filled 2013-10-26 (×2): qty 1

## 2013-10-26 MED ORDER — ADULT MULTIVITAMIN W/MINERALS CH
1.0000 | ORAL_TABLET | Freq: Every day | ORAL | Status: DC
  Administered 2013-10-26 – 2013-10-27 (×2): 1 via ORAL
  Filled 2013-10-26 (×2): qty 1

## 2013-10-26 MED ORDER — ICAPS AREDS FORMULA PO TABS: 1.0000 | ORAL_TABLET | Freq: Every day | ORAL | Status: DC

## 2013-10-26 MED ORDER — METOPROLOL TARTRATE 25 MG PO TABS
25.0000 mg | ORAL_TABLET | Freq: Two times a day (BID) | ORAL | Status: DC
  Administered 2013-10-26 – 2013-10-27 (×2): 25 mg via ORAL
  Filled 2013-10-26 (×3): qty 1

## 2013-10-26 MED ORDER — SODIUM CHLORIDE 0.9 % IJ SOLN
3.0000 mL | Freq: Two times a day (BID) | INTRAMUSCULAR | Status: DC
  Administered 2013-10-26 – 2013-10-27 (×2): 3 mL via INTRAVENOUS

## 2013-10-26 MED ORDER — DORZOLAMIDE HCL-TIMOLOL MAL PF 22.3-6.8 MG/ML OP SOLN: 1.0000 [drp] | Freq: Two times a day (BID) | OPHTHALMIC | Status: DC

## 2013-10-26 MED ORDER — SODIUM CHLORIDE 0.9 % IV SOLN: 250.0000 mL | INTRAVENOUS | Status: DC | PRN

## 2013-10-26 MED ORDER — PANTOPRAZOLE SODIUM 40 MG PO TBEC
40.0000 mg | DELAYED_RELEASE_TABLET | Freq: Every day | ORAL | Status: DC
Start: 2013-10-26 — End: 2013-10-27
  Administered 2013-10-26 – 2013-10-27 (×2): 40 mg via ORAL
  Filled 2013-10-26 (×2): qty 1

## 2013-10-26 MED ORDER — FUROSEMIDE 40 MG PO TABS
40.0000 mg | ORAL_TABLET | Freq: Every day | ORAL | Status: DC
  Administered 2013-10-27: 40 mg via ORAL
  Filled 2013-10-26 (×2): qty 1

## 2013-10-26 MED ORDER — OMEGA-3-ACID ETHYL ESTERS 1 G PO CAPS
1.0000 g | ORAL_CAPSULE | Freq: Every day | ORAL | Status: DC
  Administered 2013-10-26 – 2013-10-27 (×2): 1 g via ORAL
  Filled 2013-10-26 (×2): qty 1

## 2013-10-26 MED ORDER — LATANOPROST 0.005 % OP SOLN
1.0000 [drp] | Freq: Every day | OPHTHALMIC | Status: DC
  Filled 2013-10-26: qty 2.5

## 2013-10-26 MED ORDER — SODIUM CHLORIDE 0.9 % IJ SOLN: 3.0000 mL | INTRAMUSCULAR | Status: DC | PRN

## 2013-10-26 MED ORDER — ASPIRIN 81 MG PO CHEW
81.0000 mg | CHEWABLE_TABLET | Freq: Every day | ORAL | Status: DC
  Administered 2013-10-27: 81 mg via ORAL
  Filled 2013-10-26: qty 1

## 2013-10-26 MED ORDER — MAGNESIUM HYDROXIDE 400 MG/5ML PO SUSP: 30.0000 mL | Freq: Every day | ORAL | Status: DC | PRN

## 2013-10-26 MED ORDER — ACETAMINOPHEN 500 MG PO TABS
1000.0000 mg | ORAL_TABLET | Freq: Two times a day (BID) | ORAL | Status: DC
  Administered 2013-10-26: 1000 mg via ORAL
  Filled 2013-10-26 (×3): qty 2

## 2013-10-26 MED ORDER — OMEGA-3 FATTY ACIDS 1000 MG PO CAPS: 1.0000 g | ORAL_CAPSULE | Freq: Every day | ORAL | Status: DC

## 2013-10-26 NOTE — ED Notes (Signed)
Cardiology at bedside.

## 2013-10-26 NOTE — ED Notes (Signed)
Attempted to call report, RN unavailable. Given phone number and name to call back.

## 2013-10-26 NOTE — H&P (Addendum)
Patient ID: [name] [name] MRN: [id number], DOB/AGE: March 28, 1919   Admit date: 10/26/2013  Primary Physician: Kaleen Mask, MD Primary Cardiologist: Bishop Limbo, MD (hasn't seen in 4-5 yrs)  Pt. Profile:  80 y/o female with a h/o CAD s/p prior OM1 and LAD interventions who presented to the ED today with chest pain.  Problem List  Past Medical History  Diagnosis Date  . Hypertension   . Cataract     both eyes  . Osteoarthritis   . Glaucoma   . Poor circulation   . CAD (coronary artery disease)     a. 02/2001 PTCA & BMS to the OM1;  b. 08/2001 CBA to ISR in OM1, CBA of mid LAD;  c. 02/2002 Failed PCI of mLAD occlusion, LCX nl, patent OM1 stent, RCA dom, 40p, PDA/PL nl, EF 50% mild antlat HK.  Marland Kitchen Thyroid disease     hypothyroidism, hypothyroidism  . Hypertriglyceridemia     Past Surgical History  Procedure Laterality Date  . Abdominal hysterectomy  1972  . Eye surgery  2007/2008    cataracts  . Cardiac surgery  2001    stent placement  . Hernia repair  2009  . Gallbladder surgery  2009  . Hip arthroplasty  06/06/2012    Procedure: ARTHROPLASTY BIPOLAR HIP;  Surgeon: Eldred Manges, MD;  Location: [facility];  Service: Orthopedics;  Laterality: Right;  monopolar    Allergies  Allergies  Allergen Reactions  . Codeine Nausea And Vomiting  . Morphine And Related Nausea And Vomiting  . Penicillins Hives   HPI  80 y/o female w/o h/o CAD s/p PCI of the OM1 in 2002 with subsequent CBA for ISR in 2003.  She also underwent attempted PCI of the occluded LAD in 02/2002 however this was unsuccessful.  She has not followed up with cardiology in 4-5 yrs. She currently lives in [place] and her activity is fairly limited to getting out of bed and into the chair.  She will use a walker to get around and often will use a wheelchair.  Over the past 2-3 wks, she's been experiencing intermittent epigastric discomfort w/o associated Ss, often lasting a few mins and resolving with drinking  something warm or mylanta.  Ss occur randomly throughout the day and are not associated with swallowing/meals/exertion/palpation/position changes/deep breathing.  She had an episode of epigastric pain yesterday afternoon that lasted about 2-3 hrs and finally resolved with mylanta.  She ate dinner last night w/o issue.  This AM, she had recurrent epigastric pain and nsg home staff gave her ntg with nearly immediate relief.  She was then taken to the ED.  Here, she continues to have intermittent epigastric discomfort.  ECG is non-acute.  Trop is nl.  We've been asked to eval.  Home Medications  Prior to Admission medications   Medication Sig Start Date End Date Taking? Authorizing Provider  acetaminophen (TYLENOL) 500 MG tablet Take 1,000 mg by mouth 2 (two) times daily. pain   Yes Historical Provider, MD  aspirin 81 MG chewable tablet Chew 81 mg by mouth daily.   Yes Historical Provider, MD  bisacodyl (DULCOLAX) 10 MG suppository Place 10 mg rectally as needed for moderate constipation (for constipation not relieved by milk of magnesium).   Yes Historical Provider, MD  clopidogrel (PLAVIX) 75 MG tablet Take 75 mg by mouth daily.    Yes Historical Provider, MD  Dorzolamide HCl-Timolol Mal PF 22.3-6.8 MG/ML SOLN Apply 1 drop to eye every 12 (twelve)  hours.   Yes Historical Provider, MD  fish oil-omega-3 fatty acids 1000 MG capsule Take 1 g by mouth daily.   Yes Historical Provider, MD  furosemide (LASIX) 40 MG tablet Take 40 mg by mouth daily.   Yes Historical Provider, MD  latanoprost (XALATAN) 0.005 % ophthalmic solution Place 1 drop into both eyes at bedtime.   Yes Historical Provider, MD  Loperamide HCl (IMODIUM PO) Take 40 mg by mouth once as needed (loose stool). immodium AD caplet   Yes Historical Provider, MD  magnesium hydroxide (MILK OF MAGNESIA) 400 MG/5ML suspension Take 30 mLs by mouth daily as needed for mild constipation.   Yes Historical Provider, MD  metoprolol tartrate (LOPRESSOR) 25  MG tablet Take 25 mg by mouth 2 (two) times daily.   Yes Historical Provider, MD  Multiple Vitamins-Minerals (ICAPS) TABS Take 1 tablet by mouth daily.   Yes Historical Provider, MD  promethazine (PHENERGAN) 25 MG tablet Take 25 mg by mouth every 6 (six) hours as needed for nausea or vomiting.   Yes Historical Provider, MD  Sodium Phosphates (RA SALINE ENEMA) 19-7 GM/118ML ENEM Place 1 enema rectally daily as needed (constipation not relieved by milk of magnesium, or bisacodyl suppository).   Yes Historical Provider, MD  traMADol (ULTRAM) 50 MG tablet Take 1 tablet (50 mg total) by mouth every 6 (six) hours as needed for pain. 07/27/12  Yes Loren Racer, MD   Family History  Family History  Problem Relation Age of Onset  . Arthritis Father   . Stroke Father   . Hypertension Father   . Heart disease Sister   . Heart disease Brother   . Hypertension Brother   . Stroke Brother    Social History  History   Social History  . Marital Status: Married    Spouse Name: N/A    Number of Children: N/A  . Years of Education: N/A   Occupational History  . Not on file.   Social History Main Topics  . Smoking status: Never Smoker   . Smokeless tobacco: Never Used  . Alcohol Use: No  . Drug Use: No  . Sexual Activity: No   Other Topics Concern  . Not on file   Social History Narrative  . No narrative on file     Review of Systems General:  No chills, fever, night sweats or weight changes.  Cardiovascular:  +++ chest/epigastric pain, no dyspnea on exertion, edema, orthopnea, palpitations, paroxysmal nocturnal dyspnea. Dermatological: No rash, lesions/masses Respiratory: No cough, dyspnea Urologic: No hematuria, dysuria Abdominal:   No nausea, vomiting, diarrhea, bright red blood per rectum, melena, or hematemesis Neurologic:  No visual changes, wkns, changes in mental status. All other systems reviewed and are otherwise negative except as noted above.  Physical Exam  Blood  pressure 137/45, pulse 67, temperature 98.2 F (36.8 C), temperature source Oral, resp. rate 14, height 5\' 3"  (1.6 m), weight 179 lb (81.194 kg), SpO2 97.00%.  General: Pleasant, NAD Psych: Normal affect. Neuro: Alert and oriented X 3. Moves all extremities spontaneously. HEENT: Normal  Neck: Supple without bruits or JVD. Lungs:  Resp regular and unlabored, CTA. Heart: RRR no s3, s4, or murmurs. Abdomen: Soft, non-tender, non-distended, BS + x 4.  Extremities: No clubbing, cyanosis or edema. DP/PT/Radials 2+ and equal bilaterally.  Labs  Troponin [facility] of Care Test)  Recent Labs  10/26/13 1319  TROPIPOC 0.01   Lab Results  Component Value Date   WBC 9.8 10/26/2013   HGB 12.7  10/26/2013   HCT 37.9 10/26/2013   MCV 97.9 10/26/2013   PLT 285 10/26/2013    Recent Labs Lab 10/26/13 1242  NA 139  K 4.8  CL 96  CO2 32  BUN 25*  CREATININE 1.07  CALCIUM 10.1  GLUCOSE 92   Radiology/Studies  Dg Chest 2 View  10/26/2013   CLINICAL DATA:  Midsternal chest pain for 2 days.  EXAM: CHEST  2 VIEW  COMPARISON:  Single view of the chest 07/27/2012.  FINDINGS: There is cardiomegaly without edema. No pneumothorax or pleural effusion. No focal bony abnormality.  IMPRESSION: Cardiomegaly without acute disease.   Electronically Signed   By: Drusilla Kannerhomas  Dalessio M.D.   On: 10/26/2013 13:30   ECG   Rsr, 66, rbbb, lad, lafb, no acute st/t changes.  ASSESSMENT AND PLAN  1.  Epigastric Pain:  Pt presents with a several week h/o intermittent epigastric pain that has been managed up to this point with prn mylanta.  Today, she received sl NTG and Ss resolved quickly.  Ss atypical for angina and despite prolonged Ss yesterday,  here in the ED, troponin is nl and ECG is non-acute.  We will observe tonight and add PPI.  Cycle CE.  Would not pursue ischemic testing unless she rules in.  Discussed w/ family and pt and they prefer to avoid stress testing/cath unless absolutely indicated.  2.  CAD:  Last cath in  2003.  See above.  Cont asa, plavix, bb.    3.  HTN:  Follow.  Variable in ER.  If stable, will consider adding long-acting nitrate.  Cont BB.  Signed, Nicolasa Duckinghristopher Berge, NP 10/26/2013, 3:26 PM  As above, patient seen and examined. Briefly she is a 80 year old female with past medical history of coronary artery disease, hypertension and hypothyroidism presenting with epigastric pain. Over the past 2-3 weeks she has had intermittent epigastric pain. Her symptoms lasted 5-10 minutes and resolve spontaneously. No radiation or associated symptoms. She is very immobile. She typically remains in a wheelchair although occasionally uses a walker. Her symptoms are not exertional. They are improved with GI cocktail and drinking water. She had a more prolonged episode yesterday. Electrocardiogram shows sinus rhythm, first degree AV block, left axis deviation, right bundle branch block, no ST changes. Initial troponin normal. Symptoms are atypical and may be GI related. She states they're different than previous infarct pain. Long discussion with the patient and her family. They would like conservative measures if possible. Plan rule out myocardial infarction with serial enzymes. Add protonix. If symptoms improve and enzymes negative discharge home tomorrow morning. Olga MillersBrian Crenshaw

## 2013-10-26 NOTE — ED Notes (Signed)
Pt denies current cp, sts intermittently she will have a small pain at mid-epigastric area but when she presses on the spot the pain is at, it goes away.

## 2013-10-26 NOTE — Progress Notes (Signed)
Patient ID: [name] [name], female   DOB: 1919-06-17, 80 y.o.   MRN: [id number]   This is an acute visit.  Level care skilled.  Facility as far.  Chief complaint-acute visit secondary to intermittent chest pain.  History of present illness.  Patient is a very pleasant 80 year old female with a history of coronary artery disease status post stent placement back in 2001.  She continues on Plavix as well as aspirin-also on a beta blocker.  Apparently last night she complained of chest pain--versus abdominal discomfort-apparently nursing did administer a GI medication that did not give relief however she did administer a nitroglycerin which did bring relief.  Lab work is pending including a CK-MB and troponin-EKG also was obtained which does not appear to show anything acute.  However apparently this morning the patient began to complain of chest pain again she was given another nitroglycerin which at this point appears to have helped-.  Currently she denies any chest pain her blood pressure is somewhat low with systolic in the 80s after the nitroglycerin was administered-otherwise O2 saturations are in the 90s on room air pulse is in the 60s she appears to be comfortable.  Family medical social history has been reviewed her previous progress notes most recently 09/12/2013.  [facility] she is on Plavix as well as aspirin a beta blocker and I note she is also on Lasix with some history of edema.  Review of systems.  In general denies any fever or chills  Skin does not complain of any diaphoresis or sweating.  Head ears eyes nose mouth and throat-does not complaining of any visual changes.  Respiratory no shortness of breath.  Cardiac does complain of intermittent midsternal chest pain this does not have any radiation according to patient she does not complaining of any arm pain stomach discomfort and did not apparently complain of this overnight  either.  Neurologic does not complaining of any headache or dizziness or syncopal-type feelings.  Psych-it appears she is at her baseline according to nursing staff appears grossly alert and oriented.  Physical exam.   She is afebrile pulse is 65 respirations of 18 blood pressure systolically in the 80s   In general this is a very pleasant elderly female in no distress resting in bed comfortably.  Her skin is warm and dry she is not diaphoretic.  Heart is regular rate and rhythm without murmur gallop rub.  Chest is clear to auscultation there is no labored breathing.  Abdomen is soft does not appear to be tender there are active bowel sounds.  Extremities she appears to have trace to 1+ lower extremity edema per previous note this appears to be relatively baseline.  Muscle skeletal appears to be grossly intact moves all extremities.  Neurologic I do not see any focal weaknesses or speech is clear cranial nerves appear grossly intact.  Psych she appears grossly alert and oriented pleasant and appropriate.  Labs.  09/15/2013.  Sodium 138 potassium 4.1 BUN 26 creatinine 1.0.  Liver function tests within normal limits except albumin of 3.2.  WBC 8.4 hemoglobin 11.4 platelets 262.  Assessment and plan.  #1 chest pain with history of coronary artery disease apparently this is a fairly new complaint of patient despite her history of coronary artery disease--says this is relatively new for her will sendt to the ER for expedient evaluation to rule out any progressing cardiac event  [id number]

## 2013-10-26 NOTE — ED Provider Notes (Signed)
CSN: [id number]     Arrival date & time 10/26/13  1223 History   First MD Initiated Contact with Patient 10/26/13 1227     Chief Complaint  Patient presents with  . Chest Pain     (Consider location/radiation/quality/duration/timing/severity/associated sxs/prior Treatment) HPI Comments: Route to the ER for evaluation of chest pain. Patient does have a known history of coronary artery disease. She had onset of chest pain at 6:30 AM this morning. She lives in [facility]. She was given a sublingual nitroglycerin and the pain resolved. The pain recurred at 10:30 AM. At that time she was given aspirin 325 mg and an additional sublingual nitroglycerin. At time of arrival to the ER, patient reports that the nitroglycerin resolved the pain once again. She had no shortness of breath associated with the symptoms. She said she had slight nausea and it felt like reflux, but states "I thought that when I had my heart attack".  Patient is a 80 y.o. female presenting with chest pain.  Chest Pain Associated symptoms: nausea   Associated symptoms: not vomiting     Past Medical History  Diagnosis Date  . Hypertension   . Cataract     both eyes  . Osteoarthritis   . Glaucoma   . Poor circulation   . CAD (coronary artery disease)     a. 02/2001 PTCA & BMS to the OM1;  b. 08/2001 CBA to ISR in OM1, CBA of mid LAD;  c. 02/2002 Failed PCI of mLAD occlusion, LCX nl, patent OM1 stent, RCA dom, 40p, PDA/PL nl, EF 50% mild antlat HK.  Marland Kitchen Thyroid disease     hypothyroidism, hypothyroidism  . Hypertriglyceridemia    Past Surgical History  Procedure Laterality Date  . Abdominal hysterectomy  1972  . Eye surgery  2007/2008    cataracts  . Cardiac surgery  2001    stent placement  . Hernia repair  2009  . Gallbladder surgery  2009  . Hip arthroplasty  06/06/2012    Procedure: ARTHROPLASTY BIPOLAR HIP;  Surgeon: Eldred Manges, MD;  Location: [facility];  Service: Orthopedics;  Laterality: Right;   monopolar   Family History  Problem Relation Age of Onset  . Arthritis Father   . Stroke Father   . Hypertension Father   . Heart disease Sister   . Heart disease Brother   . Hypertension Brother   . Stroke Brother    History  Substance Use Topics  . Smoking status: Never Smoker   . Smokeless tobacco: Never Used  . Alcohol Use: No   OB History   Grav Para Term Preterm Abortions TAB SAB Ect Mult Living   5 4   1  1         Review of Systems  Cardiovascular: Positive for chest pain.  Gastrointestinal: Positive for nausea. Negative for vomiting.  All other systems reviewed and are negative.      Allergies  Codeine; Morphine and related; and Penicillins  Home Medications   Current Outpatient Rx  Name  Route  Sig  Dispense  Refill  . acetaminophen (TYLENOL) 500 MG tablet   Oral   Take 1,000 mg by mouth 2 (two) times daily. pain         . aspirin 81 MG chewable tablet   Oral   Chew 81 mg by mouth daily.         . bisacodyl (DULCOLAX) 10 MG suppository   Rectal   Place 10 mg rectally  as needed for moderate constipation (for constipation not relieved by milk of magnesium).         Marland Kitchen. clopidogrel (PLAVIX) 75 MG tablet   Oral   Take 75 mg by mouth daily.          . Dorzolamide HCl-Timolol Mal PF 22.3-6.8 MG/ML SOLN   Ophthalmic   Apply 1 drop to eye every 12 (twelve) hours.         . fish oil-omega-3 fatty acids 1000 MG capsule   Oral   Take 1 g by mouth daily.         . furosemide (LASIX) 40 MG tablet   Oral   Take 40 mg by mouth daily.         Marland Kitchen. latanoprost (XALATAN) 0.005 % ophthalmic solution   Both Eyes   Place 1 drop into both eyes at bedtime.         . Loperamide HCl (IMODIUM PO)   Oral   Take 40 mg by mouth once as needed (loose stool). immodium AD caplet         . magnesium hydroxide (MILK OF MAGNESIA) 400 MG/5ML suspension   Oral   Take 30 mLs by mouth daily as needed for mild constipation.         . metoprolol tartrate  (LOPRESSOR) 25 MG tablet   Oral   Take 25 mg by mouth 2 (two) times daily.         . Multiple Vitamins-Minerals (ICAPS) TABS   Oral   Take 1 tablet by mouth daily.         . promethazine (PHENERGAN) 25 MG tablet   Oral   Take 25 mg by mouth every 6 (six) hours as needed for nausea or vomiting.         . Sodium Phosphates (RA SALINE ENEMA) 19-7 GM/118ML ENEM   Rectal   Place 1 enema rectally daily as needed (constipation not relieved by milk of magnesium, or bisacodyl suppository).         . traMADol (ULTRAM) 50 MG tablet   Oral   Take 1 tablet (50 mg total) by mouth every 6 (six) hours as needed for pain.   15 tablet   0    BP 110/68  Pulse 74  Temp(Src) 98.2 F (36.8 C) (Oral)  Resp 14  Ht 5\' 3"  (1.6 m)  Wt 179 lb (81.194 kg)  BMI 31.72 kg/m2  SpO2 95% Physical Exam  Constitutional: She is oriented to person, place, and time. She appears well-developed and well-nourished. No distress.  HENT:  Head: Normocephalic and atraumatic.  Right Ear: Hearing normal.  Left Ear: Hearing normal.  Nose: Nose normal.  Mouth/Throat: Oropharynx is clear and moist and mucous membranes are normal.  Eyes: Conjunctivae and EOM are normal. Pupils are equal, round, and reactive to light.  Neck: Normal range of motion. Neck supple.  Cardiovascular: Regular rhythm, S1 normal and S2 normal.  Exam reveals no gallop and no friction rub.   No murmur heard. Pulmonary/Chest: Effort normal and breath sounds normal. No respiratory distress. She exhibits no tenderness.  Abdominal: Soft. Normal appearance and bowel sounds are normal. There is no hepatosplenomegaly. There is no tenderness. There is no rebound, no guarding, no tenderness at McBurney's point and negative Murphy's sign. No hernia.  Musculoskeletal: Normal range of motion.  Neurological: She is alert and oriented to person, place, and time. She has normal strength. No cranial nerve deficit or sensory deficit. Coordination normal. GCS  eye subscore is 4. GCS verbal subscore is 5. GCS motor subscore is 6.  Skin: Skin is warm, dry and intact. No rash noted. No cyanosis.  Psychiatric: She has a normal mood and affect. Her speech is normal and behavior is normal. Thought content normal.    ED Course  Procedures (including critical care time) Labs Review Labs Reviewed  BASIC METABOLIC PANEL - Abnormal; Notable for the following:    BUN 25 (*)    GFR calc non Af Amer 44 (*)    GFR calc Af Amer 51 (*)    All other components within normal limits  CBC  I-STAT TROPOININ, ED   Imaging Review Dg Chest 2 View  10/26/2013   CLINICAL DATA:  Midsternal chest pain for 2 days.  EXAM: CHEST  2 VIEW  COMPARISON:  Single view of the chest 07/27/2012.  FINDINGS: There is cardiomegaly without edema. No pneumothorax or pleural effusion. No focal bony abnormality.  IMPRESSION: Cardiomegaly without acute disease.   Electronically Signed   By: Drusilla Kanner M.D.   On: 10/26/2013 13:30     EKG Interpretation   Date/Time:  Wednesday October 26 2013 12:32:28 EST Ventricular Rate:  66 PR Interval:  218 QRS Duration: 140 QT Interval:  457 QTC Calculation: 479 R Axis:   -64 Text Interpretation:  Sinus rhythm Borderline prolonged PR interval RBBB  and LAFB Baseline wander in lead(s) III aVL No significant change since  last tracing Confirmed by POLLINA  MD, CHRISTOPHER [phone]) on 10/26/2013  12:35:43 PM      MDM   Final diagnoses:  Chest pain    Patient presents to the ER for evaluation of epigastric and chest discomfort. She has had 2 episodes today, both improved quickly after nitroglycerin. She does have a previous history of coronary artery disease. Her workup here in the ER has been unrevealing. She is pain-free. Cardiology was contacted to decide if she could be treated with increased antianginals as an outpatient or pbserved in the Hospital.    Gilda Crease, MD 10/26/13 [phone]

## 2013-10-26 NOTE — ED Notes (Addendum)
Per EMS - [facility]. pt c/o 2 CP episodes today, once at 6am, staff at nursing home administered 1 Nitro, pt was then pain free. Pt had another CP episode at 10am, staff administered 1 Nitro and 325 mg of ASA. Pt now pain free. EMS reports 12 lead showing 1st degree heart block. BP 174/78, HR 64, RR 18. 22G in left hand.

## 2013-10-27 DIAGNOSIS — I2510 Atherosclerotic heart disease of native coronary artery without angina pectoris: Secondary | ICD-10-CM

## 2013-10-27 DIAGNOSIS — R0789 Other chest pain: Secondary | ICD-10-CM | POA: Diagnosis not present

## 2013-10-27 DIAGNOSIS — D6959 Other secondary thrombocytopenia: Secondary | ICD-10-CM

## 2013-10-27 DIAGNOSIS — R1013 Epigastric pain: Secondary | ICD-10-CM

## 2013-10-27 LAB — LIPID PANEL
CHOL/HDL RATIO: 5.2 ratio
Cholesterol: 199 mg/dL (ref 0–200)
HDL: 38 mg/dL — AB (ref >39)
LDL Cholesterol: 120 mg/dL — ABNORMAL HIGH (ref 0–99)
Triglycerides: 204 mg/dL — ABNORMAL HIGH (ref <150)
VLDL: 41 mg/dL — ABNORMAL HIGH (ref 0–40)

## 2013-10-27 LAB — TROPONIN I
Troponin I: <0.3 ng/mL (ref <0.30)
Troponin I: <0.3 ng/mL (ref <0.30)

## 2013-10-27 MED ORDER — PANTOPRAZOLE SODIUM 40 MG PO TBEC: 40.0000 mg | DELAYED_RELEASE_TABLET | Freq: Every day | ORAL | Status: DC

## 2013-10-27 NOTE — Progress Notes (Signed)
Clinical Social Work Department BRIEF PSYCHOSOCIAL ASSESSMENT 10/27/2013  Patient:  [name] [name],[name] [name]     Account Number:  [id number]     Admit date:  10/26/2013  Clinical Social Worker:  Harless NakayamaAMBELAL,Taiten Brawn, LCSWA  Date/Time:  10/27/2013 10:00 AM  Referred by:  RN  Date Referred:  10/27/2013 Referred for  SNF Placement   Other Referral:   Interview type:  Patient Other interview type:    PSYCHOSOCIAL DATA Living Status:  FACILITY Admitted from facility:  [facility] Level of care:  Skilled Nursing Facility Primary support name:  Jeanella Flatteryheresea Wicker [phone] Primary support relationship to patient:  CHILD, ADULT Degree of support available:   Pt has supportive family    CURRENT CONCERNS Current Concerns  Post-Acute Placement   Other Concerns:    SOCIAL WORK ASSESSMENT / PLAN CSW informed pt was admitted from facility and potentially ready to dc back today. CSW spoke with pt and pt stated she was at [facility] prior to admission and plan will be to return. Pt originally went to facility for ST rehab but has since transitioned to long term care there. CSW spoke with facility and confirmed pt is able to dc back today if medically stable. CSW to call pt family once decision is made on dc.   Assessment/plan status:  Psychosocial Support/Ongoing Assessment of Needs Other assessment/ plan:   Information/referral to community resources:   None needed    PATIENT'S/FAMILY'S RESPONSE TO PLAN OF CARE: Pt is agreeable to return to [facility].       Dotti Busey, LCSWA [phone]

## 2013-10-27 NOTE — Discharge Summary (Signed)
CARDIOLOGY DISCHARGE SUMMARY   Patient ID: [name] CocoLena B Goetting MRN: [id number] DOB/AGE: 01/19/1919 80 y.o.  Admit date: 10/26/2013 Discharge date: 10/27/2013  PCP: Kaleen MaskELKINS,WILSON OLIVER, MD Primary Cardiologist: TK  Primary Discharge Diagnosis:   Epigastric pain  - PPI added Secondary Discharge Diagnosis:    Abnormal TSH  Procedures:  2 View CXR  Hospital Course: [name] [name] is a 80 y.o. female with a history of CAD. She has been treated medically for an occluded LAD, last cath 2003. She has been having epigastric pain daily, not exertional. She came to the hospital and received a GI cocktail as well as SL nitroglycerin. Her symptoms resolved and she was admitted for further evaluation and treatment.  There was a long discussion with the patient and her family  Her pain did not return. She was continued on previous home medications plus the addition of Protonix. Her ECG showed no acute changes and her cardiac enzymes were negative.   On 03/05, she was seen by Dr. Tresa EndoKelly. All data were reviewed. Her TSH was abnormal, other TFTs were drawn and are pending at the time of dictation. Her chest Xray did not show any acute abnormality.   Since her symptoms had resolved and all cardiac enzymes were negative, no further cardiac workup is indicated at this time. She is considered stable for discharge back to [facility] for more rehab.  Labs:   Lab Results  Component Value Date   WBC 9.8 10/26/2013   HGB 12.7 10/26/2013   HCT 37.9 10/26/2013   MCV 97.9 10/26/2013   PLT 285 10/26/2013     Recent Labs Lab 10/26/13 1242  NA 139  K 4.8  CL 96  CO2 32  BUN 25*  CREATININE 1.07  CALCIUM 10.1  GLUCOSE 92    Recent Labs  10/26/13 1842 10/27/13 0100 10/27/13 0531  TROPONINI <0.30 <0.30 <0.30   Lipid Panel     Component Value Date/Time   CHOL 199 10/27/2013 0531   TRIG 204* 10/27/2013 0531   HDL 38* 10/27/2013 0531   CHOLHDL 5.2 10/27/2013 0531   VLDL 41* 10/27/2013 0531   LDLCALC 120* 10/27/2013  0531   Radiology: Dg Chest 2 View 10/26/2013   CLINICAL DATA:  Midsternal chest pain for 2 days.  EXAM: CHEST  2 VIEW  COMPARISON:  Single view of the chest 07/27/2012.  FINDINGS: There is cardiomegaly without edema. No pneumothorax or pleural effusion. No focal bony abnormality.  IMPRESSION: Cardiomegaly without acute disease.   Electronically Signed   By: Drusilla Kannerhomas  Dalessio M.D.   On: 10/26/2013 13:30   EKG: SR, no acute ischemic changes  FOLLOW UP PLANS AND APPOINTMENTS Allergies  Allergen Reactions  . Codeine Nausea And Vomiting  . Morphine And Related Nausea And Vomiting  . Penicillins Hives     Medication List         acetaminophen 500 MG tablet  Commonly known as:  TYLENOL  Take 1,000 mg by mouth 2 (two) times daily. pain     aspirin 81 MG chewable tablet  Chew 81 mg by mouth daily.     bisacodyl 10 MG suppository  Commonly known as:  DULCOLAX  Place 10 mg rectally as needed for moderate constipation (for constipation not relieved by milk of magnesium).     clopidogrel 75 MG tablet  Commonly known as:  PLAVIX  Take 75 mg by mouth daily.     Dorzolamide HCl-Timolol Mal PF 22.3-6.8 MG/ML Soln  Apply 1 drop  to eye every 12 (twelve) hours.     fish oil-omega-3 fatty acids 1000 MG capsule  Take 1 g by mouth daily.     furosemide 40 MG tablet  Commonly known as:  LASIX  Take 40 mg by mouth daily.     ICAPS Tabs  Take 1 tablet by mouth daily.     IMODIUM PO  Take 40 mg by mouth once as needed (loose stool). immodium AD caplet     latanoprost 0.005 % ophthalmic solution  Commonly known as:  XALATAN  Place 1 drop into both eyes at bedtime.     magnesium hydroxide 400 MG/5ML suspension  Commonly known as:  MILK OF MAGNESIA  Take 30 mLs by mouth daily as needed for mild constipation.     metoprolol tartrate 25 MG tablet  Commonly known as:  LOPRESSOR  Take 25 mg by mouth 2 (two) times daily.     pantoprazole 40 MG tablet  Commonly known as:  PROTONIX  Take 1  tablet (40 mg total) by mouth daily.     promethazine 25 MG tablet  Commonly known as:  PHENERGAN  Take 25 mg by mouth every 6 (six) hours as needed for nausea or vomiting.     RA SALINE ENEMA 19-7 GM/118ML Enem  Place 1 enema rectally daily as needed (constipation not relieved by milk of magnesium, or bisacodyl suppository).     traMADol 50 MG tablet  Commonly known as:  ULTRAM  Take 1 tablet (50 mg total) by mouth every 6 (six) hours as needed for pain.        Discharge Orders   Future Orders Complete By Expires   Diet - low sodium heart healthy  As directed    Increase activity slowly  As directed      Follow-up Information   Follow up with Lennette Bihari, MD. (As needed)    Specialty:  Cardiology   Contact information:   [address] [place] [address] [phone]       BRING ALL MEDICATIONS WITH YOU TO FOLLOW UP APPOINTMENTS  Time spent with patient to include physician time: 48 min Signed: Theodore Demark, PA-C 10/27/2013, 2:02 PM Co-Sign MD

## 2013-10-27 NOTE — Progress Notes (Signed)
CSW (Clinical Child psychotherapistocial Worker) prepared pt dc packet and placed with shadow chart. CSW arranged transport with facility. Pt, pt family, pt nurse, and facility informed. CSW signing off.   Stpehen Petitjean, LCSWA [phone]

## 2013-10-27 NOTE — Progress Notes (Signed)
Patient Name: [name] [name] Date of Encounter: 10/27/2013  Active Problems:   Epigastric pain    SUBJECTIVE: Symptoms have resolved, no SOB or chest pain  OBJECTIVE Filed Vitals:   10/26/13 1730 10/26/13 1807 10/26/13 2049 10/27/13 0522  BP: 138/56 151/44 129/53 149/47  Pulse: 71 72 74 54  Temp:  98.1 F (36.7 C) 97.7 F (36.5 C) 97.9 F (36.6 C)  TempSrc:  Oral Oral Oral  Resp: 19 18 16 18   Height:      Weight:    170 lb 4.8 oz (77.248 kg)  SpO2: 94% 93% 96% 92%   No intake or output data in the 24 hours ending 10/27/13 0915 Filed Weights   10/26/13 1237 10/27/13 0522  Weight: 179 lb (81.194 kg) 170 lb 4.8 oz (77.248 kg)    PHYSICAL EXAM General: Well developed, well nourished, female in no acute distress. Head: Normocephalic, atraumatic.  Neck: Supple without bruits, JVD not elevated Lungs:  Resp regular and unlabored, CTA. Heart: RRR, S1, S2, no S3, S4, or murmur; no rub. Abdomen: Soft, non-tender, non-distended, BS + x 4.  Extremities: No clubbing, cyanosis, no edema.  Neuro: Alert and oriented X 3. Moves all extremities spontaneously. Psych: Normal affect.  LABS: CBC: Recent Labs  10/26/13 1242  WBC 9.8  HGB 12.7  HCT 37.9  MCV 97.9  PLT 285   Basic Metabolic Panel: Recent Labs  10/26/13 1242  NA 139  K 4.8  CL 96  CO2 32  GLUCOSE 92  BUN 25*  CREATININE 1.07  CALCIUM 10.1   Cardiac Enzymes: Recent Labs  10/26/13 1842 10/27/13 0100 10/27/13 0531  TROPONINI <0.30 <0.30 <0.30    Recent Labs  10/26/13 1319  TROPIPOC 0.01   Fasting Lipid Panel: Recent Labs  10/27/13 0531  CHOL 199  HDL 38*  LDLCALC 120*  TRIG 204*  CHOLHDL 5.2   Thyroid Function Tests: Recent Labs  10/26/13 1842  TSH 6.149*   TELE:   SR, no significant ectopy, occ sinus brady     Radiology/Studies: Dg Chest 2 View 10/26/2013   CLINICAL DATA:  Midsternal chest pain for 2 days.  EXAM: CHEST  2 VIEW  COMPARISON:  Single view of the chest 07/27/2012.   FINDINGS: There is cardiomegaly without edema. No pneumothorax or pleural effusion. No focal bony abnormality.  IMPRESSION: Cardiomegaly without acute disease.   Electronically Signed   By: Drusilla Kanner M.D.   On: 10/26/2013 13:30   Current Medications:  . acetaminophen  1,000 mg Oral BID  . aspirin  81 mg Oral Daily  . clopidogrel  75 mg Oral Daily  . dorzolamide-timolol  1 drop Both Eyes BID  . furosemide  40 mg Oral Daily  . heparin  5,000 Units Subcutaneous 3 times per day  . latanoprost  1 drop Both Eyes QHS  . metoprolol tartrate  25 mg Oral BID  . multivitamin with minerals  1 tablet Oral Daily  . omega-3 acid ethyl esters  1 g Oral Daily  . pantoprazole  40 mg Oral Daily  . sodium chloride  3 mL Intravenous Q12H    ASSESSMENT AND PLAN: 80 yo female with hx CAD/PCI (Last cath 2003 w/ LAD 100%, o/w non-obs dz, EF 50%, med Rx) was admitted w/ prolonged chest pain, better w/ GI rx and NTG. "Long discussion with the patient and her family. They would like conservative measures if possible."   Active Problems:   Epigastric pain - resolved, enzymes  negative for MI. ECG not acute. See above. Continue medical Rx for CAD, add PPI for possible GERD. Not anemic, no reason to think she is bleeding. On ASA, Plavix, BB, Omega 3 but no statin. LFTs not checked, would check prior to starting statin.     Abnl TSH - slightly elevated, no others in system. MD advise on sending Free T3/T4, or follow this as OP.    If no further inpatient workup, d/c back to [facility] today, f/u as OP w/ primary MD, +/- cards.  Signed, Theodore Demarkhonda Barrett , PA-C 9:15 AM 10/27/2013  Patient seen and examined. Agree with assessment and plan. Feels better. No abdominal discomfort, chest pain or sob. OK for dc today with added PPI. F/U thyroid studies as outpatient.   Lennette Biharihomas A. Kelly, MD, [facility] HospitalFACC 10/27/2013 12:15 PM

## 2013-10-28 LAB — T4, FREE: Free T4: 0.84 ng/dL (ref 0.80–1.80)

## 2013-10-28 LAB — T3, FREE: T3, Free: 2.6 pg/mL (ref 2.3–4.2)

## 2013-11-01 ENCOUNTER — Non-Acute Institutional Stay (SKILLED_NURSING_FACILITY): Payer: Medicare Other | Admitting: Internal Medicine

## 2013-11-01 DIAGNOSIS — K529 Noninfective gastroenteritis and colitis, unspecified: Secondary | ICD-10-CM

## 2013-11-01 DIAGNOSIS — E860 Dehydration: Secondary | ICD-10-CM

## 2013-11-02 ENCOUNTER — Ambulatory Visit: Payer: Medicare Other | Admitting: Cardiovascular Disease

## 2013-11-02 ENCOUNTER — Non-Acute Institutional Stay (SKILLED_NURSING_FACILITY): Payer: Medicare Other | Admitting: Internal Medicine

## 2013-11-02 DIAGNOSIS — N289 Disorder of kidney and ureter, unspecified: Secondary | ICD-10-CM

## 2013-11-02 NOTE — Progress Notes (Signed)
Patient ID: [name] [name], female   DOB: 1918/09/30, 80 y.o.   MRN: [id number]   This is an acute visit.  Level of care skilled.  Facility [name]   chief complaint-acute visit secondary to elevated creatinine followup nausea and vomiting.  History of present illness.  Patient is a pleasant 80 year old female who apparently caught the GI virus earlier this week in the facility she had nausea vomiting and diarrhea which appears to have largely resolved.  Her Lasix was held for 2 days she had been on 40 mg a day.  Lab work came back showing an elevated creatinine of 1.5 and BUN of 37 previous creatinine on day January 22 was 1 with a BUN of 26.  She says she does feel better today.  Family medical social history as been reviewed.  Medications have been reviewed per MAR.  Review of systems.  In general no complaints of fever or chills.  Respiratory no complaint shortness of breath or cough.  Cardiac no chest pain she did have a history of chest pain earlier with history of coronary artery disease she was kept in the hospital overnight as a precaution.  GI-does not really complain of abdominal pain nausea vomiting or diarrhea at this time certainly did have this earlier in the week.  Physical exam.  Temperature is 97.3 pulse 65 respirations 18 blood pressure 125/58.  In general this is a pleasant elderly female in no distress lying comfortably in bed.  Her skin is warm and dry.  Oropharynx clear mucous membranes do appear moist.  Chest is clear to auscultation no labored breathing.  Heart is regular rate and rhythm without murmur gallop or rub she does have some mild lower extremity edema bilaterally.  Abdomen is soft nontender  does not appear to be acutely tender there is some mild tenderness to palpation I suspect secondary to  invasive maneuver  Labs.  11/02/2013.  Sodium 136 potassium 3.7 BUN 37 creatinine 1.5.  WBC 11.1 hemoglobin 11.4 platelets  310.  Liver function tests within normal limits.  09/15/2013.  Sodium 138 potassium 4.1 BUN 26 creatinine 1.0.  Assessment plan.  #1-renal insufficiency I suspect this was due to the recent nausea and vomiting and diarrhea which appears to be resolving-will give her 1 L of normal saline at 70 cc an hour and recheck her metabolic panel as well as a CBC-this plan was discussed with Dr. Renato Gailseed via phone  [phone]

## 2013-11-02 NOTE — Progress Notes (Signed)
Patient ID: [name] CocoLena B [name] [name], female   DOB: 12-Feb-1919, 80 y.o.   MRN: [id number]  [name] Provider:  Gwenith Spitziffany L. Renato Gailseed, D.O., C.M.D.  Code Status:  DNR  Chief Complaint  Patient presents with  . Acute Visit    lethargy, malaise, nausea, vomiting and diarrhea all weekend, HR 100    HPI:  80 yo female with symptoms as above seen for acute visit.    Review of Systems:  Review of Systems  Constitutional: Positive for chills and malaise/fatigue. Negative for fever.  Respiratory: Negative for cough and shortness of breath.   Cardiovascular: Negative for chest pain.  Gastrointestinal: Positive for nausea, vomiting, abdominal pain and diarrhea. Negative for blood in stool and melena.  Genitourinary: Negative for dysuria.  Musculoskeletal: Negative for falls.  Skin: Negative for rash.  Neurological: Positive for weakness. Negative for dizziness, loss of consciousness and headaches.  Endo/Heme/Allergies: Bruises/bleeds easily.  Psychiatric/Behavioral: Negative for depression.    Medications: Patient's Medications  New Prescriptions   No medications on file  Previous Medications   ACETAMINOPHEN (TYLENOL) 500 MG TABLET    Take 1,000 mg by mouth 2 (two) times daily. pain   ASPIRIN 81 MG CHEWABLE TABLET    Chew 81 mg by mouth daily.   BISACODYL (DULCOLAX) 10 MG SUPPOSITORY    Place 10 mg rectally as needed for moderate constipation (for constipation not relieved by milk of magnesium).   CLOPIDOGREL (PLAVIX) 75 MG TABLET    Take 75 mg by mouth daily.    DORZOLAMIDE HCL-TIMOLOL MAL PF 22.3-6.8 MG/ML SOLN    Apply 1 drop to eye every 12 (twelve) hours.   FISH OIL-OMEGA-3 FATTY ACIDS 1000 MG CAPSULE    Take 1 g by mouth daily.   FUROSEMIDE (LASIX) 40 MG TABLET    Take 40 mg by mouth daily.   LATANOPROST (XALATAN) 0.005 % OPHTHALMIC SOLUTION    Place 1 drop into both eyes at bedtime.   LOPERAMIDE HCL (IMODIUM PO)    Take 40 mg by mouth once as needed (loose stool). immodium AD  caplet   MAGNESIUM HYDROXIDE (MILK OF MAGNESIA) 400 MG/5ML SUSPENSION    Take 30 mLs by mouth daily as needed for mild constipation.   METOPROLOL TARTRATE (LOPRESSOR) 25 MG TABLET    Take 25 mg by mouth 2 (two) times daily.   MULTIPLE VITAMINS-MINERALS (ICAPS) TABS    Take 1 tablet by mouth daily.   PANTOPRAZOLE (PROTONIX) 40 MG TABLET    Take 1 tablet (40 mg total) by mouth daily.   PROMETHAZINE (PHENERGAN) 25 MG TABLET    Take 25 mg by mouth every 6 (six) hours as needed for nausea or vomiting.   SODIUM PHOSPHATES (RA SALINE ENEMA) 19-7 GM/118ML ENEM    Place 1 enema rectally daily as needed (constipation not relieved by milk of magnesium, or bisacodyl suppository).   TRAMADOL (ULTRAM) 50 MG TABLET    Take 1 tablet (50 mg total) by mouth every 6 (six) hours as needed for pain.  Modified Medications   No medications on file  Discontinued Medications   No medications on file    Physical Exam: VS reviewed. Physical Exam  Constitutional: She is oriented to person, place, and time. No distress.  Cardiovascular: Normal rate, regular rhythm, normal heart sounds and intact distal pulses.   Pulmonary/Chest: Effort normal and breath sounds normal. No respiratory distress.  Abdominal: Soft. She exhibits no distension. There is tenderness.  hyperactive  Musculoskeletal: Normal range of motion. She  exhibits no edema and no tenderness (.ros).  Neurological: She is alert and oriented to person, place, and time.  Skin: Skin is warm and dry.  Psychiatric: She has a normal mood and affect.     Labs reviewed: Basic Metabolic Panel:  Recent Labs  40/98/11 1242  NA 139  K 4.8  CL 96  CO2 32  GLUCOSE 92  BUN 25*  CREATININE 1.07  CALCIUM 10.1    Liver Function Tests: No results found for this basename: AST, ALT, ALKPHOS, BILITOT, PROT, ALBUMIN,  in the last 8760 hours  CBC:  Recent Labs  10/26/13 1242  WBC 9.8  HGB 12.7  HCT 37.9  MCV 97.9  PLT 285    Assessment/Plan 1.  Gastroenteritis presumed infectious -workup as below -encourage hydration and begin IVF NS at 70cc per hr for 2 liters -labs will help guide  2. Dehydration -fluids  Family/ staff Communication: discussed with her son who was present  Goals of care: DNR, long term care resident  Labs/tests ordered:  Cbc, bmp, stool studies

## 2013-11-04 ENCOUNTER — Non-Acute Institutional Stay (SKILLED_NURSING_FACILITY): Payer: Medicare Other | Admitting: Internal Medicine

## 2013-11-04 DIAGNOSIS — R1033 Periumbilical pain: Secondary | ICD-10-CM

## 2013-11-04 DIAGNOSIS — A049 Bacterial intestinal infection, unspecified: Secondary | ICD-10-CM

## 2013-11-04 DIAGNOSIS — N179 Acute kidney failure, unspecified: Secondary | ICD-10-CM

## 2013-11-04 DIAGNOSIS — R3989 Other symptoms and signs involving the genitourinary system: Secondary | ICD-10-CM

## 2013-11-04 NOTE — Progress Notes (Signed)
Patient ID: Rudi CocoLena B Carns, female   DOB: October 23, 1918, 80 y.o.   MRN: [id number]  Location:  [facility] Provider:  Gwenith Spitziffany L. Renato Gailseed, D.O., C.M.D.  Code Status:  DNR, has living will and power of attorney   Chief Complaint  Patient presents with  . Acute Visit    acute renal failure due to gastroenteritis    HPI:  80 yo female here for long term care with h/o diastolic chf, constipation seen for acute visit due to ongoing gastroenteritis now for one week with associated acute renal failure.  Her intake has remained poor with diarrhea and vomiting each time she takes any po.  She has been on a liquid diet.  Her son is here and confirms all of the above, and is getting more concerned about her.  She did receive 1 liter of fluids, but her IV was then removed.    Review of Systems:  Review of Systems  Constitutional: Positive for malaise/fatigue. Negative for fever and chills.  HENT: Negative for congestion.   Respiratory: Negative for shortness of breath.   Cardiovascular: Negative for chest pain and leg swelling.  Gastrointestinal: Positive for nausea, vomiting, abdominal pain and diarrhea. Negative for constipation, blood in stool and melena.  Genitourinary: Negative for dysuria.  Musculoskeletal: Positive for myalgias. Negative for falls.  Skin: Negative for rash.  Neurological: Positive for dizziness and weakness.    Medications: Patient's Medications  New Prescriptions   No medications on file  Previous Medications   ACETAMINOPHEN (TYLENOL) 500 MG TABLET    Take 1,000 mg by mouth 2 (two) times daily. pain   ASPIRIN 81 MG CHEWABLE TABLET    Chew 81 mg by mouth daily.   BISACODYL (DULCOLAX) 10 MG SUPPOSITORY    Place 10 mg rectally as needed for moderate constipation (for constipation not relieved by milk of magnesium).   CLOPIDOGREL (PLAVIX) 75 MG TABLET    Take 75 mg by mouth daily.    DORZOLAMIDE HCL-TIMOLOL MAL PF 22.3-6.8 MG/ML SOLN    Apply 1 drop to eye every 12  (twelve) hours.   FISH OIL-OMEGA-3 FATTY ACIDS 1000 MG CAPSULE    Take 1 g by mouth daily.   FUROSEMIDE (LASIX) 40 MG TABLET    Take 40 mg by mouth daily.   LATANOPROST (XALATAN) 0.005 % OPHTHALMIC SOLUTION    Place 1 drop into both eyes at bedtime.   LOPERAMIDE HCL (IMODIUM PO)    Take 40 mg by mouth once as needed (loose stool). immodium AD caplet   MAGNESIUM HYDROXIDE (MILK OF MAGNESIA) 400 MG/5ML SUSPENSION    Take 30 mLs by mouth daily as needed for mild constipation.   METOPROLOL TARTRATE (LOPRESSOR) 25 MG TABLET    Take 25 mg by mouth 2 (two) times daily.   MULTIPLE VITAMINS-MINERALS (ICAPS) TABS    Take 1 tablet by mouth daily.   PANTOPRAZOLE (PROTONIX) 40 MG TABLET    Take 1 tablet (40 mg total) by mouth daily.   PROMETHAZINE (PHENERGAN) 25 MG TABLET    Take 25 mg by mouth every 6 (six) hours as needed for nausea or vomiting.   SODIUM PHOSPHATES (RA SALINE ENEMA) 19-7 GM/118ML ENEM    Place 1 enema rectally daily as needed (constipation not relieved by milk of magnesium, or bisacodyl suppository).   TRAMADOL (ULTRAM) 50 MG TABLET    Take 1 tablet (50 mg total) by mouth every 6 (six) hours as needed for pain.  Modified Medications  No medications on file  Discontinued Medications   No medications on file    Physical Exam: Physical Exam  Constitutional: No distress.  Lethargic white female resting in bed, appears weak  HENT:  Head: Normocephalic and atraumatic.  Cardiovascular: Normal rate, regular rhythm, normal heart sounds and intact distal pulses.   Pulmonary/Chest: Effort normal and breath sounds normal. No respiratory distress.  Abdominal: Soft. There is tenderness.  Hyperactive bowel sounds; tenderness periumbilically  Musculoskeletal: Normal range of motion.  Neurological:  lethargic  Psychiatric: She has a normal mood and affect.    Labs reviewed: Basic Metabolic Panel:  Recent Labs  16/10/96 1242  NA 139  K 4.8  CL 96  CO2 32  GLUCOSE 92  BUN 25*    CREATININE 1.07  CALCIUM 10.1    CBC:  Recent Labs  10/26/13 1242  WBC 9.8  HGB 12.7  HCT 37.9  MCV 97.9  PLT 285   10/26/13:  Wbc 9.8, BUN 25, cr 1.07 11/03/13:  Wbc 14.5, h/h 11.8/34.8, plts 268, Na 139, K 4.3, BUN 20, cr 1.18  Assessment/Plan 1. Bacterial gastroenteritis NS @ 70cc/hr x 2 liters, keep IV cipro 500 bid x 3 days Cbc, bmp, amylase, lipase in am  2. Prerenal acute renal failure -IVF as above with f/u labs in morning  3. Abdominal pain, periumbilical -amylase, lipase to r/o pancreatitis  Family/ staff Communication: discussed with her son   Goals of care: DNR  Labs/tests ordered: cbc, bmp, amylase, lipase in am

## 2013-11-06 DIAGNOSIS — N289 Disorder of kidney and ureter, unspecified: Secondary | ICD-10-CM | POA: Insufficient documentation

## 2013-11-11 ENCOUNTER — Non-Acute Institutional Stay (SKILLED_NURSING_FACILITY): Payer: Medicare Other | Admitting: Internal Medicine

## 2013-11-11 DIAGNOSIS — A049 Bacterial intestinal infection, unspecified: Secondary | ICD-10-CM

## 2013-11-12 NOTE — Progress Notes (Signed)
Patient ID: [name] [name], female   DOB: [birth year], 80 y.o.   MRN: [id number]  Location:  [facility] Provider:  Catina Nuss L. Renato Gails, D.O., C.M.D.   Chief Complaint  Patient presents with  . Acute Visit    follow up on gastroenteritis    HPI:  80 yo long term care resident seen for f/u on her gastroenteritis.  She is now eating well.  She has no further n/v/d.  She still is feeling a little bit full quickly and has some increased acid reflux after meals since the "GI bug".  Her son is with her over dinner and also feels she is amazingly better after the IVF and cipro abx with florastor.    Review of Systems:  Review of Systems  Constitutional: Positive for malaise/fatigue. Negative for fever and chills.  HENT: Negative for congestion.   Respiratory: Negative for shortness of breath.   Cardiovascular: Negative for chest pain and leg swelling.  Gastrointestinal: Positive for heartburn. Negative for nausea, vomiting, abdominal pain, diarrhea, constipation, blood in stool and melena.  Genitourinary: Negative for dysuria.  Musculoskeletal: Negative for falls.  Skin: Negative for rash.  Neurological: Positive for weakness.    Medications: Patient's Medications  New Prescriptions   No medications on file  Previous Medications   ACETAMINOPHEN (TYLENOL) 500 MG TABLET    Take 1,000 mg by mouth 2 (two) times daily. pain   ASPIRIN 81 MG CHEWABLE TABLET    Chew 81 mg by mouth daily.   BISACODYL (DULCOLAX) 10 MG SUPPOSITORY    Place 10 mg rectally as needed for moderate constipation (for constipation not relieved by milk of magnesium).   CLOPIDOGREL (PLAVIX) 75 MG TABLET    Take 75 mg by mouth daily.    DORZOLAMIDE HCL-TIMOLOL MAL PF 22.3-6.8 MG/ML SOLN    Apply 1 drop to eye every 12 (twelve) hours.   FISH OIL-OMEGA-3 FATTY ACIDS 1000 MG CAPSULE    Take 1 g by mouth daily.   FUROSEMIDE (LASIX) 40 MG TABLET    Take 40 mg by mouth daily.   LATANOPROST (XALATAN) 0.005 % OPHTHALMIC  SOLUTION    Place 1 drop into both eyes at bedtime.   LOPERAMIDE HCL (IMODIUM PO)    Take 40 mg by mouth once as needed (loose stool). immodium AD caplet   MAGNESIUM HYDROXIDE (MILK OF MAGNESIA) 400 MG/5ML SUSPENSION    Take 30 mLs by mouth daily as needed for mild constipation.   METOPROLOL TARTRATE (LOPRESSOR) 25 MG TABLET    Take 25 mg by mouth 2 (two) times daily.   MULTIPLE VITAMINS-MINERALS (ICAPS) TABS    Take 1 tablet by mouth daily.   PANTOPRAZOLE (PROTONIX) 40 MG TABLET    Take 1 tablet (40 mg total) by mouth daily.   PROMETHAZINE (PHENERGAN) 25 MG TABLET    Take 25 mg by mouth every 6 (six) hours as needed for nausea or vomiting.   SODIUM PHOSPHATES (RA SALINE ENEMA) 19-7 GM/118ML ENEM    Place 1 enema rectally daily as needed (constipation not relieved by milk of magnesium, or bisacodyl suppository).   TRAMADOL (ULTRAM) 50 MG TABLET    Take 1 tablet (50 mg total) by mouth every 6 (six) hours as needed for pain.  Modified Medications   No medications on file  Discontinued Medications   No medications on file    Physical Exam: Filed Vitals:   11/11/13 0803  BP: 130/58  Pulse: 60  Temp: 97.4 F (36.3 C)  Resp: 20  Weight: 173 lb 9.6 oz (78.744 kg)  Physical Exam  Constitutional: She is oriented to person, place, and time. She appears well-developed and well-nourished. No distress.  Cardiovascular: Normal rate, regular rhythm, normal heart sounds and intact distal pulses.   Pulmonary/Chest: Effort normal and breath sounds normal.  Abdominal: Soft. Bowel sounds are normal. She exhibits no distension. There is no tenderness.  Musculoskeletal: Normal range of motion.  Neurological: She is alert and oriented to person, place, and time.  Skin: Skin is warm and dry. There is pallor.  Psychiatric: She has a normal mood and affect.    Labs reviewed: Basic Metabolic Panel:  Recent Labs  09/81/1901/12/07 1242  NA 139  K 4.8  CL 96  CO2 32  GLUCOSE 92  BUN 25*  CREATININE 1.07    CALCIUM 10.1   CBC:  Recent Labs  10/26/13 1242  WBC 9.8  HGB 12.7  HCT 37.9  MCV 97.9  PLT 285   Assessment/Plan 1. Bacterial gastroenteritis -has resolved after treatment with cipro and IVF  -had been present for over a week  -remains a bit weak and having some early satiety and GERD afterwards, but otherwise doing well  Family/ staff Communication: discussed with her son who was present for the visit  Goals of care: long term resident

## 2013-11-14 ENCOUNTER — Encounter: Payer: Self-pay | Admitting: Internal Medicine

## 2013-12-20 ENCOUNTER — Non-Acute Institutional Stay (SKILLED_NURSING_FACILITY): Payer: Medicare Other | Admitting: Internal Medicine

## 2013-12-20 DIAGNOSIS — I10 Essential (primary) hypertension: Secondary | ICD-10-CM

## 2013-12-20 DIAGNOSIS — I2510 Atherosclerotic heart disease of native coronary artery without angina pectoris: Secondary | ICD-10-CM

## 2013-12-20 DIAGNOSIS — E781 Pure hyperglyceridemia: Secondary | ICD-10-CM

## 2013-12-20 DIAGNOSIS — R609 Edema, unspecified: Secondary | ICD-10-CM

## 2013-12-22 DIAGNOSIS — E785 Hyperlipidemia, unspecified: Secondary | ICD-10-CM | POA: Insufficient documentation

## 2013-12-22 NOTE — Progress Notes (Signed)
Patient ID: [name] [name], female   DOB: 08-Jun-1919, 80 y.o.   MRN: [id number]         PROGRESS NOTE  DATE: 12/20/2013  FACILITY: Nursing Home Location: [facility]  LEVEL OF CARE: SNF (31)  Routine Visit  CHIEF COMPLAINT:  Manage edema, coronary artery disease and hypertension.   HISTORY OF PRESENT ILLNESS:  REASSESSMENT OF ONGOING PROBLEM(S):  CAD: The angina has been stable. The patient denies dyspnea on exertion, orthopnea, palpitations and paroxysmal nocturnal dyspnea. No complications noted from the medication presently being used. Complains of lower extremity swelling  HTN: Pt 's HTN remains stable.  Denies CP, sob, DOE, pedal edema, headaches, dizziness or visual disturbances.  No complications from the medications currently being used.  Last BP : 121/75,104/56, 93/51, 100/52, 98/63, 112/58, 112/54, 110/53.  EDEMA: The patient's edema is stable. Patient denies increasing lower extremity swelling.  Denies calf pain, chest pain or shortness of breath. No complications reported from the medications currently being used.  PAST MEDICAL HISTORY : Reviewed.  No changes.  [facility]  REVIEW OF SYSTEMS:  GENERAL: no change in appetite, no fatigue, no weight changes, no fever, chills or weakness RESPIRATORY: no cough, SOB, DOE, wheezing, hemoptysis CARDIAC: no chest pain, or palpitations, c/o  LE swelling GI: no abdominal pain, diarrhea, constipation, heart burn, nausea or vomiting  PHYSICAL EXAMINATION  VS:  See vital signs section  GENERAL: no acute distress, moderately obese body habitus EYES: Normal sclerae, normal conjunctivae, discharge NECK: supple, trachea midline, no neck masses, no thyroid tenderness, no thyromegaly LYMPHATICS: No cervical lymphadenopathy, no supraclavicular lymphadenopathy RESPIRATORY: breathing is even & unlabored, BS CTAB CARDIAC: RRR, no murmur,no extra heart sounds EDEMA/VARICOSITIES:  +1  bilateral lower extremity edema  ARTERIAL:  pedal pulses diminished  GI: abdomen soft, normal BS, no masses, no tenderness, no hepatomegaly, no splenomegaly PSYCHIATRIC: the patient is alert & oriented to person, affect & behavior appropriate  LABS/RADIOLOGY: 3-15 WBC 14.5, hemoglobin 11.8, MCV 91.8, platelets 268, glucose 123, creatinine 1.18 otherwise BMP normal, liver profile normal 1-15 CO2 34, BUN 26, albumin 3.2 otherwise CMP normal, CBC normal 7-14 BUN 29, creatinine 1.11  6/14 HbA1c 5.6, Hb 11.6, mcv 91.8 ow cbc nl, glc 140, bun 29, cr 1.28 ow cmp nl 07/2012:  Hemoglobin 10.8, MCV 91.9, otherwise CBC normal.   CMP normal.  TSH 3.813.    ASSESSMENT/PLAN:   Hypertension.  Well controlled.   Coronary artery disease.  Stable.   Edema-improved  Hypertriglyceridemia-on fish oil.  Osteoarthritis-pain well controlled  GERD-continue PPI  Leukocytosis-recheck  CPT CODE: [id number]  Newton PiggGayani Y. Kerry Doryasanayaka, MD [facility] [phone]

## 2014-04-10 ENCOUNTER — Emergency Department (HOSPITAL_COMMUNITY): Payer: Medicare Other

## 2014-04-10 ENCOUNTER — Non-Acute Institutional Stay (SKILLED_NURSING_FACILITY): Payer: Medicare Other | Admitting: Internal Medicine

## 2014-04-10 ENCOUNTER — Inpatient Hospital Stay (HOSPITAL_COMMUNITY)
Admission: EM | Admit: 2014-04-10 | Discharge: 2014-04-14 | DRG: 603 | Disposition: A | Discharge status: Skilled Nursing Facility | Payer: Medicare Other | Attending: Internal Medicine | Admitting: Internal Medicine

## 2014-04-10 ENCOUNTER — Encounter (HOSPITAL_COMMUNITY): Payer: Self-pay | Admitting: Emergency Medicine

## 2014-04-10 DIAGNOSIS — E039 Hypothyroidism, unspecified: Secondary | ICD-10-CM | POA: Diagnosis present

## 2014-04-10 DIAGNOSIS — E781 Pure hyperglyceridemia: Secondary | ICD-10-CM

## 2014-04-10 DIAGNOSIS — M25571 Pain in right ankle and joints of right foot: Secondary | ICD-10-CM

## 2014-04-10 DIAGNOSIS — L02419 Cutaneous abscess of limb, unspecified: Secondary | ICD-10-CM | POA: Diagnosis not present

## 2014-04-10 DIAGNOSIS — R609 Edema, unspecified: Secondary | ICD-10-CM

## 2014-04-10 DIAGNOSIS — Z823 Family history of stroke: Secondary | ICD-10-CM

## 2014-04-10 DIAGNOSIS — Z96649 Presence of unspecified artificial hip joint: Secondary | ICD-10-CM

## 2014-04-10 DIAGNOSIS — Z66 Do not resuscitate: Secondary | ICD-10-CM | POA: Diagnosis not present

## 2014-04-10 DIAGNOSIS — H269 Unspecified cataract: Secondary | ICD-10-CM | POA: Diagnosis present

## 2014-04-10 DIAGNOSIS — I2510 Atherosclerotic heart disease of native coronary artery without angina pectoris: Secondary | ICD-10-CM | POA: Diagnosis present

## 2014-04-10 DIAGNOSIS — I10 Essential (primary) hypertension: Secondary | ICD-10-CM

## 2014-04-10 DIAGNOSIS — L03115 Cellulitis of right lower limb: Secondary | ICD-10-CM

## 2014-04-10 DIAGNOSIS — R1013 Epigastric pain: Secondary | ICD-10-CM

## 2014-04-10 DIAGNOSIS — L0390 Cellulitis, unspecified: Secondary | ICD-10-CM | POA: Diagnosis present

## 2014-04-10 DIAGNOSIS — Z7982 Long term (current) use of aspirin: Secondary | ICD-10-CM

## 2014-04-10 DIAGNOSIS — H409 Unspecified glaucoma: Secondary | ICD-10-CM

## 2014-04-10 DIAGNOSIS — N289 Disorder of kidney and ureter, unspecified: Secondary | ICD-10-CM

## 2014-04-10 DIAGNOSIS — Y9210 Unspecified residential institution as the place of occurrence of the external cause: Secondary | ICD-10-CM | POA: Diagnosis present

## 2014-04-10 DIAGNOSIS — E785 Hyperlipidemia, unspecified: Secondary | ICD-10-CM | POA: Diagnosis present

## 2014-04-10 DIAGNOSIS — M7989 Other specified soft tissue disorders: Secondary | ICD-10-CM | POA: Diagnosis not present

## 2014-04-10 DIAGNOSIS — K219 Gastro-esophageal reflux disease without esophagitis: Secondary | ICD-10-CM | POA: Diagnosis present

## 2014-04-10 DIAGNOSIS — S9030XA Contusion of unspecified foot, initial encounter: Secondary | ICD-10-CM | POA: Diagnosis present

## 2014-04-10 DIAGNOSIS — Z79899 Other long term (current) drug therapy: Secondary | ICD-10-CM

## 2014-04-10 DIAGNOSIS — M25579 Pain in unspecified ankle and joints of unspecified foot: Secondary | ICD-10-CM

## 2014-04-10 DIAGNOSIS — N179 Acute kidney failure, unspecified: Secondary | ICD-10-CM | POA: Diagnosis present

## 2014-04-10 DIAGNOSIS — D638 Anemia in other chronic diseases classified elsewhere: Secondary | ICD-10-CM

## 2014-04-10 DIAGNOSIS — Z9861 Coronary angioplasty status: Secondary | ICD-10-CM

## 2014-04-10 DIAGNOSIS — M1990 Unspecified osteoarthritis, unspecified site: Secondary | ICD-10-CM

## 2014-04-10 DIAGNOSIS — Z7902 Long term (current) use of antithrombotics/antiplatelets: Secondary | ICD-10-CM

## 2014-04-10 DIAGNOSIS — T502X5A Adverse effect of carbonic-anhydrase inhibitors, benzothiadiazides and other diuretics, initial encounter: Secondary | ICD-10-CM | POA: Diagnosis present

## 2014-04-10 DIAGNOSIS — D649 Anemia, unspecified: Secondary | ICD-10-CM

## 2014-04-10 DIAGNOSIS — W230XXA Caught, crushed, jammed, or pinched between moving objects, initial encounter: Secondary | ICD-10-CM | POA: Diagnosis present

## 2014-04-10 DIAGNOSIS — L03119 Cellulitis of unspecified part of limb: Principal | ICD-10-CM

## 2014-04-10 DIAGNOSIS — N390 Urinary tract infection, site not specified: Secondary | ICD-10-CM

## 2014-04-10 LAB — CBC WITH DIFFERENTIAL/PLATELET
BASOS PCT: 0 % (ref 0–1)
Basophils Absolute: 0 10*3/uL (ref 0.0–0.1)
EOS ABS: 0.3 10*3/uL (ref 0.0–0.7)
EOS PCT: 3 % (ref 0–5)
HCT: 34.8 % — ABNORMAL LOW (ref 36.0–46.0)
Hemoglobin: 11.5 g/dL — ABNORMAL LOW (ref 12.0–15.0)
Lymphocytes Relative: 24 % (ref 12–46)
Lymphs Abs: 2.5 10*3/uL (ref 0.7–4.0)
MCH: 31.7 pg (ref 26.0–34.0)
MCHC: 33 g/dL (ref 30.0–36.0)
MCV: 95.9 fL (ref 78.0–100.0)
MONOS PCT: 12 % (ref 3–12)
Monocytes Absolute: 1.2 10*3/uL — ABNORMAL HIGH (ref 0.1–1.0)
NEUTROS PCT: 61 % (ref 43–77)
Neutro Abs: 6.2 10*3/uL (ref 1.7–7.7)
Platelets: 243 10*3/uL (ref 150–400)
RBC: 3.63 MIL/uL — ABNORMAL LOW (ref 3.87–5.11)
RDW: 13 % (ref 11.5–15.5)
WBC: 10.3 10*3/uL (ref 4.0–10.5)

## 2014-04-10 LAB — I-STAT CG4 LACTIC ACID, ED: Lactic Acid, Venous: 0.86 mmol/L (ref 0.5–2.2)

## 2014-04-10 LAB — BASIC METABOLIC PANEL
Anion gap: 13 (ref 5–15)
BUN: 20 mg/dL (ref 6–23)
CALCIUM: 9.7 mg/dL (ref 8.4–10.5)
CO2: 28 mEq/L (ref 19–32)
Chloride: 99 mEq/L (ref 96–112)
Creatinine, Ser: 1.15 mg/dL — ABNORMAL HIGH (ref 0.50–1.10)
GFR, EST AFRICAN AMERICAN: 46 mL/min — AB (ref >90)
GFR, EST NON AFRICAN AMERICAN: 40 mL/min — AB (ref >90)
Glucose, Bld: 111 mg/dL — ABNORMAL HIGH (ref 70–99)
POTASSIUM: 4 meq/L (ref 3.7–5.3)
Sodium: 140 mEq/L (ref 137–147)

## 2014-04-10 MED ORDER — ACETAMINOPHEN 325 MG PO TABS
650.0000 mg | ORAL_TABLET | Freq: Once | ORAL | Status: AC
  Administered 2014-04-11: 650 mg via ORAL
  Filled 2014-04-10: qty 2

## 2014-04-10 MED ORDER — CLINDAMYCIN PHOSPHATE 600 MG/50ML IV SOLN
600.0000 mg | Freq: Once | INTRAVENOUS | Status: AC
  Administered 2014-04-11: 600 mg via INTRAVENOUS
  Filled 2014-04-10: qty 50
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
## 2014-04-11 ENCOUNTER — Observation Stay (HOSPITAL_COMMUNITY): Payer: Medicare Other

## 2014-04-11 ENCOUNTER — Encounter (HOSPITAL_COMMUNITY): Payer: Self-pay | Admitting: *Deleted

## 2014-04-11 DIAGNOSIS — M1990 Unspecified osteoarthritis, unspecified site: Secondary | ICD-10-CM | POA: Diagnosis present

## 2014-04-11 DIAGNOSIS — I2510 Atherosclerotic heart disease of native coronary artery without angina pectoris: Secondary | ICD-10-CM | POA: Diagnosis present

## 2014-04-11 DIAGNOSIS — W230XXA Caught, crushed, jammed, or pinched between moving objects, initial encounter: Secondary | ICD-10-CM | POA: Diagnosis present

## 2014-04-11 DIAGNOSIS — E785 Hyperlipidemia, unspecified: Secondary | ICD-10-CM | POA: Diagnosis present

## 2014-04-11 DIAGNOSIS — T502X5A Adverse effect of carbonic-anhydrase inhibitors, benzothiadiazides and other diuretics, initial encounter: Secondary | ICD-10-CM | POA: Diagnosis present

## 2014-04-11 DIAGNOSIS — Y9210 Unspecified residential institution as the place of occurrence of the external cause: Secondary | ICD-10-CM | POA: Diagnosis present

## 2014-04-11 DIAGNOSIS — Z7982 Long term (current) use of aspirin: Secondary | ICD-10-CM | POA: Diagnosis not present

## 2014-04-11 DIAGNOSIS — D638 Anemia in other chronic diseases classified elsewhere: Secondary | ICD-10-CM

## 2014-04-11 DIAGNOSIS — L03119 Cellulitis of unspecified part of limb: Principal | ICD-10-CM

## 2014-04-11 DIAGNOSIS — M7989 Other specified soft tissue disorders: Secondary | ICD-10-CM | POA: Diagnosis present

## 2014-04-11 DIAGNOSIS — Z823 Family history of stroke: Secondary | ICD-10-CM | POA: Diagnosis not present

## 2014-04-11 DIAGNOSIS — H269 Unspecified cataract: Secondary | ICD-10-CM | POA: Diagnosis present

## 2014-04-11 DIAGNOSIS — Z79899 Other long term (current) drug therapy: Secondary | ICD-10-CM | POA: Diagnosis not present

## 2014-04-11 DIAGNOSIS — Z9861 Coronary angioplasty status: Secondary | ICD-10-CM | POA: Diagnosis not present

## 2014-04-11 DIAGNOSIS — I10 Essential (primary) hypertension: Secondary | ICD-10-CM | POA: Diagnosis present

## 2014-04-11 DIAGNOSIS — Z7902 Long term (current) use of antithrombotics/antiplatelets: Secondary | ICD-10-CM | POA: Diagnosis not present

## 2014-04-11 DIAGNOSIS — N179 Acute kidney failure, unspecified: Secondary | ICD-10-CM | POA: Diagnosis present

## 2014-04-11 DIAGNOSIS — Z66 Do not resuscitate: Secondary | ICD-10-CM | POA: Diagnosis not present

## 2014-04-11 DIAGNOSIS — E039 Hypothyroidism, unspecified: Secondary | ICD-10-CM | POA: Diagnosis present

## 2014-04-11 DIAGNOSIS — L02419 Cutaneous abscess of limb, unspecified: Secondary | ICD-10-CM | POA: Diagnosis present

## 2014-04-11 DIAGNOSIS — N390 Urinary tract infection, site not specified: Secondary | ICD-10-CM | POA: Diagnosis present

## 2014-04-11 DIAGNOSIS — H409 Unspecified glaucoma: Secondary | ICD-10-CM | POA: Diagnosis present

## 2014-04-11 DIAGNOSIS — S9030XA Contusion of unspecified foot, initial encounter: Secondary | ICD-10-CM | POA: Diagnosis present

## 2014-04-11 DIAGNOSIS — K219 Gastro-esophageal reflux disease without esophagitis: Secondary | ICD-10-CM | POA: Diagnosis present

## 2014-04-11 DIAGNOSIS — Z96649 Presence of unspecified artificial hip joint: Secondary | ICD-10-CM | POA: Diagnosis not present

## 2014-04-11 LAB — URINALYSIS, ROUTINE W REFLEX MICROSCOPIC
BILIRUBIN URINE: NEGATIVE
Glucose, UA: NEGATIVE mg/dL
Ketones, ur: NEGATIVE mg/dL
Nitrite: POSITIVE — AB
PROTEIN: NEGATIVE mg/dL
Specific Gravity, Urine: 1.013 (ref 1.005–1.030)
UROBILINOGEN UA: 0.2 mg/dL (ref 0.0–1.0)
pH: 6.5 (ref 5.0–8.0)

## 2014-04-11 LAB — CBC WITH DIFFERENTIAL/PLATELET
BASOS PCT: 0 % (ref 0–1)
Basophils Absolute: 0 10*3/uL (ref 0.0–0.1)
Eosinophils Absolute: 0.3 10*3/uL (ref 0.0–0.7)
Eosinophils Relative: 3 % (ref 0–5)
HEMATOCRIT: 32.4 % — AB (ref 36.0–46.0)
HEMOGLOBIN: 10.5 g/dL — AB (ref 12.0–15.0)
LYMPHS PCT: 27 % (ref 12–46)
Lymphs Abs: 2.6 10*3/uL (ref 0.7–4.0)
MCH: 31.1 pg (ref 26.0–34.0)
MCHC: 32.4 g/dL (ref 30.0–36.0)
MCV: 95.9 fL (ref 78.0–100.0)
MONO ABS: 1.1 10*3/uL — AB (ref 0.1–1.0)
MONOS PCT: 11 % (ref 3–12)
NEUTROS ABS: 5.5 10*3/uL (ref 1.7–7.7)
Neutrophils Relative %: 59 % (ref 43–77)
Platelets: 243 10*3/uL (ref 150–400)
RBC: 3.38 MIL/uL — ABNORMAL LOW (ref 3.87–5.11)
RDW: 12.9 % (ref 11.5–15.5)
WBC: 9.4 10*3/uL (ref 4.0–10.5)

## 2014-04-11 LAB — COMPREHENSIVE METABOLIC PANEL
ALT: 12 U/L (ref 0–35)
AST: 19 U/L (ref 0–37)
Albumin: 3 g/dL — ABNORMAL LOW (ref 3.5–5.2)
Alkaline Phosphatase: 78 U/L (ref 39–117)
Anion gap: 12 (ref 5–15)
BILIRUBIN TOTAL: 0.5 mg/dL (ref 0.3–1.2)
BUN: 19 mg/dL (ref 6–23)
CALCIUM: 9.2 mg/dL (ref 8.4–10.5)
CO2: 28 mEq/L (ref 19–32)
Chloride: 101 mEq/L (ref 96–112)
Creatinine, Ser: 1.09 mg/dL (ref 0.50–1.10)
GFR calc Af Amer: 49 mL/min — ABNORMAL LOW (ref >90)
GFR calc non Af Amer: 43 mL/min — ABNORMAL LOW (ref >90)
Glucose, Bld: 108 mg/dL — ABNORMAL HIGH (ref 70–99)
POTASSIUM: 3.8 meq/L (ref 3.7–5.3)
SODIUM: 141 meq/L (ref 137–147)
Total Protein: 7.1 g/dL (ref 6.0–8.3)

## 2014-04-11 LAB — PHOSPHORUS: PHOSPHORUS: 3.1 mg/dL (ref 2.3–4.6)

## 2014-04-11 LAB — MAGNESIUM: Magnesium: 1.8 mg/dL (ref 1.5–2.5)

## 2014-04-11 LAB — GLUCOSE, CAPILLARY: Glucose-Capillary: 102 mg/dL — ABNORMAL HIGH (ref 70–99)

## 2014-04-11 LAB — URINE MICROSCOPIC-ADD ON

## 2014-04-11 LAB — PROTIME-INR
INR: 1.2 (ref 0.00–1.49)
Prothrombin Time: 15.2 seconds (ref 11.6–15.2)

## 2014-04-11 LAB — MRSA PCR SCREENING: MRSA by PCR: POSITIVE — AB

## 2014-04-11 LAB — TSH: TSH: 4.22 u[IU]/mL (ref 0.350–4.500)

## 2014-04-11 LAB — APTT: aPTT: 42 seconds — ABNORMAL HIGH (ref 24–37)

## 2014-04-11 MED ORDER — DORZOLAMIDE HCL-TIMOLOL MAL 2-0.5 % OP SOLN
1.0000 [drp] | Freq: Two times a day (BID) | OPHTHALMIC | Status: DC
  Administered 2014-04-11 – 2014-04-13 (×6): 1 [drp] via OPHTHALMIC
  Filled 2014-04-11: qty 10

## 2014-04-11 MED ORDER — CLINDAMYCIN PHOSPHATE 300 MG/50ML IV SOLN
300.0000 mg | Freq: Four times a day (QID) | INTRAVENOUS | Status: DC
  Administered 2014-04-11: 300 mg via INTRAVENOUS
  Filled 2014-04-11 (×2): qty 50

## 2014-04-11 MED ORDER — VANCOMYCIN HCL IN DEXTROSE 1-5 GM/200ML-% IV SOLN
1000.0000 mg | INTRAVENOUS | Status: DC
  Administered 2014-04-11 – 2014-04-14 (×4): 1000 mg via INTRAVENOUS
  Filled 2014-04-11 (×4): qty 200

## 2014-04-11 MED ORDER — PROSIGHT PO TABS
1.0000 | ORAL_TABLET | Freq: Every day | ORAL | Status: DC
  Administered 2014-04-11 – 2014-04-14 (×4): 1 via ORAL
  Filled 2014-04-11 (×4): qty 1

## 2014-04-11 MED ORDER — LATANOPROST 0.005 % OP SOLN
1.0000 [drp] | Freq: Every day | OPHTHALMIC | Status: DC
  Administered 2014-04-11 – 2014-04-13 (×3): 1 [drp] via OPHTHALMIC
  Filled 2014-04-11: qty 2.5

## 2014-04-11 MED ORDER — MAGNESIUM HYDROXIDE 400 MG/5ML PO SUSP: 30.0000 mL | Freq: Every day | ORAL | Status: DC | PRN

## 2014-04-11 MED ORDER — DORZOLAMIDE HCL-TIMOLOL MAL PF 22.3-6.8 MG/ML OP SOLN: 1.0000 [drp] | Freq: Two times a day (BID) | OPHTHALMIC | Status: DC

## 2014-04-11 MED ORDER — ONDANSETRON HCL 4 MG PO TABS: 4.0000 mg | ORAL_TABLET | Freq: Four times a day (QID) | ORAL | Status: DC | PRN

## 2014-04-11 MED ORDER — SODIUM CHLORIDE 0.9 % IV SOLN: INTRAVENOUS | Status: DC

## 2014-04-11 MED ORDER — PROMETHAZINE HCL 25 MG PO TABS: 25.0000 mg | ORAL_TABLET | Freq: Four times a day (QID) | ORAL | Status: DC | PRN

## 2014-04-11 MED ORDER — DEXTROSE 5 % IV SOLN
1.0000 g | INTRAVENOUS | Status: AC
  Administered 2014-04-11 – 2014-04-13 (×3): 1 g via INTRAVENOUS
  Filled 2014-04-11 (×4): qty 10

## 2014-04-11 MED ORDER — FLEET ENEMA 7-19 GM/118ML RE ENEM: 1.0000 | ENEMA | Freq: Every day | RECTAL | Status: DC | PRN

## 2014-04-11 MED ORDER — BISACODYL 10 MG RE SUPP: 10.0000 mg | RECTAL | Status: DC | PRN

## 2014-04-11 MED ORDER — RA SALINE ENEMA 19-7 GM/118ML RE ENEM: 1.0000 | ENEMA | Freq: Every day | RECTAL | Status: DC | PRN

## 2014-04-11 MED ORDER — ONDANSETRON HCL 4 MG/2ML IJ SOLN
4.0000 mg | Freq: Four times a day (QID) | INTRAMUSCULAR | Status: DC | PRN
  Administered 2014-04-12: 4 mg via INTRAVENOUS
  Filled 2014-04-11: qty 2

## 2014-04-11 MED ORDER — TRAMADOL HCL 50 MG PO TABS
50.0000 mg | ORAL_TABLET | Freq: Four times a day (QID) | ORAL | Status: DC | PRN
  Administered 2014-04-11 – 2014-04-13 (×3): 50 mg via ORAL
  Filled 2014-04-11 (×3): qty 1

## 2014-04-11 MED ORDER — OMEGA-3 FATTY ACIDS 1000 MG PO CAPS: 1.0000 g | ORAL_CAPSULE | Freq: Every day | ORAL | Status: DC

## 2014-04-11 MED ORDER — METOPROLOL TARTRATE 25 MG PO TABS
25.0000 mg | ORAL_TABLET | Freq: Two times a day (BID) | ORAL | Status: DC
  Administered 2014-04-11 – 2014-04-14 (×6): 25 mg via ORAL
  Filled 2014-04-11 (×8): qty 1

## 2014-04-11 MED ORDER — CLOPIDOGREL BISULFATE 75 MG PO TABS
75.0000 mg | ORAL_TABLET | Freq: Every day | ORAL | Status: DC
  Administered 2014-04-11 – 2014-04-14 (×4): 75 mg via ORAL
  Filled 2014-04-11 (×4): qty 1

## 2014-04-11 MED ORDER — ACETAMINOPHEN 500 MG PO TABS
500.0000 mg | ORAL_TABLET | Freq: Every day | ORAL | Status: DC
  Administered 2014-04-11 – 2014-04-13 (×4): 500 mg via ORAL
  Filled 2014-04-11 (×5): qty 1

## 2014-04-11 MED ORDER — FENTANYL CITRATE 0.05 MG/ML IJ SOLN
12.5000 ug | INTRAMUSCULAR | Status: DC | PRN
  Administered 2014-04-13: 12.5 ug via INTRAVENOUS
  Filled 2014-04-11: qty 2

## 2014-04-11 MED ORDER — ASPIRIN 81 MG PO CHEW
81.0000 mg | CHEWABLE_TABLET | Freq: Every day | ORAL | Status: DC
  Administered 2014-04-11 – 2014-04-14 (×4): 81 mg via ORAL
  Filled 2014-04-11 (×4): qty 1

## 2014-04-11 MED ORDER — PANTOPRAZOLE SODIUM 40 MG PO TBEC
40.0000 mg | DELAYED_RELEASE_TABLET | Freq: Every day | ORAL | Status: DC
  Administered 2014-04-11 – 2014-04-14 (×4): 40 mg via ORAL
  Filled 2014-04-11 (×4): qty 1

## 2014-04-11 MED ORDER — OMEGA-3-ACID ETHYL ESTERS 1 G PO CAPS
1.0000 g | ORAL_CAPSULE | Freq: Every day | ORAL | Status: DC
  Administered 2014-04-11 – 2014-04-14 (×4): 1 g via ORAL
  Filled 2014-04-11 (×4): qty 1

## 2014-04-11 MED ORDER — SODIUM CHLORIDE 0.9 % IV SOLN
Freq: Once | INTRAVENOUS | Status: AC
  Administered 2014-04-11: 01:00:00 via INTRAVENOUS

## 2014-04-11 NOTE — Progress Notes (Signed)
Notification of this patient being admitted to 5N22, was given.  I called the ED three times, on the third time I notified Monica in the ED that the extension that I was routed to rang and rang, no one picked up the phone and would she please send a message to the nurse that I called and to call me at her convenience.  I waited 30 minutes and did not hear from the nurse.  I let Lanora Manislizabeth know this also, she is Location managercharge nurse tonight.  I attempted once again and Robin, a nurse taking care of another patient in the same room gave me report.  Patient arrived here via stretcher and with her family.  Daughter had the POA with her and we made a copy for the chart.
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
## 2014-04-11 NOTE — Progress Notes (Signed)
Utilization review completed.  

## 2014-04-11 NOTE — Progress Notes (Signed)
Lana from the lab called to confirm a positive nasal swab for MRSA for this patient.

## 2014-04-11 NOTE — Progress Notes (Signed)
Patient Demographics  [name] [name], is a 80 y.o. female, DOB - 06/10/1919, [id number]  Admit date - 04/10/2014   Admitting Physician Dorothea OgleIskra M Myers, MD  Outpatient Primary MD for the patient is Kaleen MaskELKINS,WILSON OLIVER, MD  LOS - 1   Chief Complaint  Patient presents with  . Foot Pain        Subjective:   [name] [name] today has, No headache, No chest pain, No abdominal pain - No Nausea, No new weakness tingling or numbness, No Cough - SOB. Mild R foot pain  Assessment & Plan    Cellulitis, right foot with soft tissue injury and possible small fluctuant hematoma Placed on vancomycin, right foot soft tissue ultrasound obtained consistent with hematoma, since she is surrounding cellulitis have requested Dr. Lajoyce Cornersuda orthopedics to evaluate for possible drainage.     History of coronary artery disease  Stable, continue aspirin, beta blocker and Plavix   Dyslipidemia  Continue fish oil supplements   Hypertension  Continue metoprolol  Hold Lasix due to renal insufficiency   Acute renal failure  Perhaps exacerbated by Lasix. Lasix is on hold for now.  Renal failure resolved we'll monitor clinically    GERD  On PPI continue.    Glaucoma  Stable and current eye drops   UTI  Placed on Rocephin for 3 days monitor cultures.      Code Status: DNR  Family Communication: none present  Disposition Plan: SNF   Procedures R foot US, R foot XRAY   Consults  Ortho Duda   Medications  Scheduled Meds: . acetaminophen  500 mg Oral QHS  . aspirin  81 mg Oral Daily  . clopidogrel  75 mg Oral Daily  . dorzolamide-timolol  1 drop Both Eyes BID  . latanoprost  1 drop Both Eyes QHS  . metoprolol tartrate  25 mg Oral BID  . multivitamin  1 tablet Oral Daily  . omega-3 acid ethyl  esters  1 g Oral Daily  . pantoprazole  40 mg Oral Daily  . vancomycin  1,000 mg Intravenous Q24H   Continuous Infusions: . sodium chloride     PRN Meds:.bisacodyl, fentaNYL, magnesium hydroxide, ondansetron (ZOFRAN) IV, ondansetron, promethazine, sodium phosphate, traMADol  DVT Prophylaxis     Lab Results  Component Value Date   PLT 243 04/11/2014    Antibiotics   Anti-infectives   Start     Dose/Rate Route Frequency Ordered Stop   04/11/14 1000  vancomycin (VANCOCIN) IVPB 1000 mg/200 mL premix     1,000 mg 200 mL/hr over 60 Minutes Intravenous Every 24 hours 04/11/14 0752     04/11/14 0600  clindamycin (CLEOCIN) IVPB 300 mg  Status:  Discontinued     300 mg 100 mL/hr over 30 Minutes Intravenous 4 times per day 04/11/14 0023 04/11/14 0741   04/10/14 2345  clindamycin (CLEOCIN) IVPB 600 mg     600 mg 100 mL/hr over 30 Minutes Intravenous  Once 04/10/14 2330 04/11/14 0151          Objective:   Filed Vitals:   04/11/14 0000 04/11/14 0025 04/11/14 0150 04/11/14 0512  BP: 145/61  155/50 123/41  Pulse: 85  73 72  Temp:  98.9 F (37.2 C) 98.8 F (37.1 C) 98  F (36.7 C)  TempSrc:  Oral Oral Oral  Resp:      Height:      Weight:      SpO2: 93%  95% 94%    Wt Readings from Last 3 Encounters:  04/10/14 77.111 kg (170 lb)  12/20/13 78.019 kg (172 lb)  11/11/13 78.744 kg (173 lb 9.6 oz)    No intake or output data in the 24 hours ending 04/11/14 0951   Physical Exam  Awake Alert, Oriented X 3, No new F.N deficits, Normal affect [place].AT,PERRAL Supple Neck,No JVD, No cervical lymphadenopathy appriciated.  Symmetrical Chest wall movement, Good air movement bilaterally, CTAB RRR,No Gallops,Rubs or new Murmurs, No Parasternal Heave +ve B.Sounds, Abd Soft, No tenderness, No organomegaly appriciated, No rebound - guarding or rigidity. No Cyanosis, Clubbing or edema, No new Rash or bruise , right foot on the latter aspect has a small fluctuant area which appears to be  hematoma but certainly has mild surrounding cellulitis   Data Review   Micro Results Recent Results (from the past 240 hour(s))  MRSA PCR SCREENING     Status: Abnormal   Collection Time    04/11/14  3:25 AM      Result Value Ref Range Status   MRSA by PCR POSITIVE (*) NEGATIVE Final   Comment:            The GeneXpert MRSA Assay (FDA     approved for NASAL specimens     only), is one component of a     comprehensive MRSA colonization     surveillance program. It is not     intended to diagnose MRSA     infection nor to guide or     monitor treatment for     MRSA infections.     RESULT CALLED TO, READ BACK BY AND VERIFIED WITH:     [facility] RN [phone] 04/11/14 MITCHELL,L    Radiology Reports Dg Chest 2 View  04/10/2014   CLINICAL DATA:  Fever  EXAM: CHEST  2 VIEW  COMPARISON:  10/26/2013  FINDINGS: Chronic cardiomegaly. Evidence of coronary stenting. Unchanged tortuosity the atherosclerotic aorta. There is no edema, consolidation, effusion, or pneumothorax.  IMPRESSION: Negative for pneumonia.   Electronically Signed   By: Tiburcio Pea M.D.   On: 04/10/2014 22:43   [place] Extrem Low Right Ltd  04/11/2014   CLINICAL DATA:  Trauma.  EXAM: ULTRASOUND RIGHT LOWER EXTREMITY LIMITED  TECHNIQUE: Ultrasound examination of the lower extremity soft tissues was performed in the area of clinical concern.  COMPARISON:  Foot series 617 2015.  FINDINGS: Subcutaneous 4.8 x 1.5 cm complex hypoechoic region noted along the right lateral foot. Given history of trauma, this most likely represents a hematoma. Other etiologies soft tissue mass cannot be excluded.  IMPRESSION: 4.8 x 1.5 cm complex hypoechoic region noted along the right lateral foot. Given history of trauma, this is most likely a hematoma.   Electronically Signed   By: Maisie Fus  Register   On: 04/11/2014 09:14   Dg Foot Complete Right  04/10/2014   CLINICAL DATA:  Pain and swelling.  Previous trauma.  EXAM: RIGHT FOOT COMPLETE - 3+ VIEW   COMPARISON:  None.  FINDINGS: The joint spaces are maintained. Moderate degenerative changes involving the DIP and PIP joints of the toes. No acute fracture is identified. Calcaneal spurring changes are noted.  IMPRESSION: No acute bony findings.  Moderate degenerative changes involving the DIP and PIP joints of the toes.  Electronically Signed   By: Loralie Champagne M.D.   On: 04/10/2014 22:44    CBC  Recent Labs Lab 04/10/14 2150 04/11/14 0340  WBC 10.3 9.4  HGB 11.5* 10.5*  HCT 34.8* 32.4*  PLT 243 243  MCV 95.9 95.9  MCH 31.7 31.1  MCHC 33.0 32.4  RDW 13.0 12.9  LYMPHSABS 2.5 2.6  MONOABS 1.2* 1.1*  EOSABS 0.3 0.3  BASOSABS 0.0 0.0    Chemistries   Recent Labs Lab 04/10/14 2150 04/11/14 0340  NA 140 141  K 4.0 3.8  CL 99 101  CO2 28 28  GLUCOSE 111* 108*  BUN 20 19  CREATININE 1.15* 1.09  CALCIUM 9.7 9.2  MG  --  1.8  AST  --  19  ALT  --  12  ALKPHOS  --  78  BILITOT  --  0.5   ------------------------------------------------------------------------------------------------------------------ estimated creatinine clearance is 32.4 ml/min (by C-G formula based on Cr of 1.09). ------------------------------------------------------------------------------------------------------------------ No results found for this basename: HGBA1C,  in the last 72 hours ------------------------------------------------------------------------------------------------------------------ No results found for this basename: CHOL, HDL, LDLCALC, TRIG, CHOLHDL, LDLDIRECT,  in the last 72 hours ------------------------------------------------------------------------------------------------------------------  Recent Labs  04/11/14 0340  TSH 4.220   ------------------------------------------------------------------------------------------------------------------ No results found for this basename: VITAMINB12, FOLATE, FERRITIN, TIBC, IRON, RETICCTPCT,  in the last 72 hours  Coagulation  profile  Recent Labs Lab 04/11/14 0340  INR 1.20    No results found for this basename: DDIMER,  in the last 72 hours  Cardiac Enzymes No results found for this basename: CK, CKMB, TROPONINI, MYOGLOBIN,  in the last 168 hours ------------------------------------------------------------------------------------------------------------------ No components found with this basename: POCBNP,      Time Spent in minutes  35   Susa Raring K M.D on 04/11/2014 at 9:51 AM  Between 7am to 7pm - Pager - [phone]  After 7pm go to [email or web address] - password [email or web address]  And look for the night coverage person covering for me after hours  [facility] Office  [phone]   **Disclaimer: This note may have been dictated with voice recognition software. Similar sounding words can inadvertently be transcribed and this note may contain transcription errors which may not have been corrected upon publication of note.**

## 2014-04-11 NOTE — Progress Notes (Signed)
ANTIBIOTIC CONSULT NOTE - INITIAL  Pharmacy Consult for vancomycin Indication: cellulitis  Allergies  Allergen Reactions  . Codeine Nausea And Vomiting  . Morphine And Related Nausea And Vomiting  . Penicillins Hives    Patient Measurements: Height: 5\' 3"  (160 cm) Weight: 170 lb (77.111 kg) IBW/kg (Calculated) : 52.4   Vital Signs: Temp: 98 F (36.7 C) (08/18 0512) Temp src: Oral (08/18 0512) BP: 123/41 mmHg (08/18 0512) Pulse Rate: 72 (08/18 0512) Intake/Output from previous day:   Intake/Output from this shift:    Labs:  Recent Labs  04/10/14 2150 04/11/14 0340  WBC 10.3 9.4  HGB 11.5* 10.5*  PLT 243 243  CREATININE 1.15* 1.09   Estimated Creatinine Clearance: 32.4 ml/min (by C-G formula based on Cr of 1.09). No results found for this basename: VANCOTROUGH, Leodis BinetVANCOPEAK, VANCORANDOM, GENTTROUGH, GENTPEAK, GENTRANDOM, TOBRATROUGH, TOBRAPEAK, TOBRARND, AMIKACINPEAK, AMIKACINTROU, AMIKACIN,  in the last 72 hours   Microbiology: Recent Results (from the past 720 hour(s))  MRSA PCR SCREENING     Status: Abnormal   Collection Time    04/11/14  3:25 AM      Result Value Ref Range Status   MRSA by PCR POSITIVE (*) NEGATIVE Final   Comment:            The GeneXpert MRSA Assay (FDA     approved for NASAL specimens     only), is one component of a     comprehensive MRSA colonization     surveillance program. It is not     intended to diagnose MRSA     infection nor to guide or     monitor treatment for     MRSA infections.     RESULT CALLED TO, READ BACK BY AND VERIFIED WITH:     [facility] NevadaDEZARN,C RN [phone] 04/11/14 MITCHELL,L    Medical History: Past Medical History  Diagnosis Date  . Hypertension   . Cataract     both eyes  . Osteoarthritis   . Glaucoma   . Poor circulation   . CAD (coronary artery disease)     a. 02/2001 PTCA & BMS to the OM1;  b. 08/2001 CBA to ISR in [place], CBA of mid LAD;  c. 02/2002 Failed PCI of mLAD occlusion, LCX nl, patent OM1 stent, RCA dom,  40p, PDA/PL nl, EF 50% mild antlat HK.  Marland Kitchen. Thyroid disease     hypothyroidism, hypothyroidism  . Hypertriglyceridemia   Assessment: 80 year old female with right foot swelling and redness. New orders to start IV vancomycin for cellulitis. Tmax 100.4, wbc 9.4, renal function normal. Clindamycin given this morning.  Goal of Therapy:  Vancomycin trough level 10-15 mcg/ml  Plan:  Measure antibiotic drug levels at steady state Follow up culture results Vancomycin 1g IV q 24 hours  Sheppard CoilFrank Wilson PharmD., BCPS Clinical Pharmacist Pager [phone] 04/11/2014 7:46 AM

## 2014-04-11 NOTE — H&P (Addendum)
[facility] History and Physical  Rudi CocoLena B Jerkins MVH:[id number] DOB: 30-Sep-1918 DOA: 04/10/2014  Referring physician: ED physician PCP: Kaleen MaskELKINS,WILSON OLIVER, MD   Chief Complaint: Right foot swelling and redness  HPI:  80 year old female, resident of [facility] skilled nursing facility with past medical history of coronary artery disease, hypertension, hip fracture in past for which reason she is in skilled nursing facility, glaucoma who [facility]10/2014 with worsening swelling, pain and redness over right foot. Patient reported that about a week ago in nursing home wheelchair went across her foot and ever since then swelling and tenderness worsened. Patient reports she only child nursing home medication for pain relief, ultram which somewhat relieved the pain. Swelling did not go down even with her taking Lasix. Patient also reported having fever in the nursing home. No open wounds visible on the right foot. No other complaints of chest pain, shortness of breath or palpitations. No abdominal pain, nausea or vomiting. No diarrhea. No cough. No blood in the stool or urine. In ED, blood pressure was 145/61, heart rate 85,, T max 100.4 F and oxygen saturation 93% on 2 L nasal cannula oxygen support. Blood work revealed hemoglobin of 11.5 and creatinine of 1.15. Chest x-ray showed no acute cardiopulmonary process. X-ray of the right foot showed no acute bony findings but moderate degenerative changes were seen. Patient had significant swelling and redness of the right foot. She was started on clindamycin in ED. Patient was admitted for further management of cellulitis.  Assessment and Plan:  Principal problem:   Cellulitis, right foot  Likely related to recent trauma to the right foot.  Continue clindamycin which was started in ED  Continue analgesia as needed  Active problems: History of coronary artery disease  Stable, continue aspirin and Plavix Dyslipidemia  Continue  fish oil supplements Hypertension  Continue metoprolol   Hold Lasix due to renal insufficiency Acute renal failure  Perhaps exacerbated by Lasix. Lasix is on hold for now.  Followup renal function in the morning  SCD's for DVT prophylaxis   Radiological Exams on Admission: Dg Chest 2 View 04/10/2014   IMPRESSION: Negative for pneumonia.     Dg Foot Complete Right 04/10/2014  IMPRESSION: No acute bony findings.  Moderate degenerative changes involving the DIP and PIP joints of the toes.       Code Status: Full Family Communication: Pt at bedside Disposition Plan: Admit for further evaluation     Review of Systems:  Constitutional: . Negative for diaphoresis.  HENT: Negative for hearing loss, ear pain, nosebleeds, congestion, sore throat, neck pain, tinnitus and ear discharge.   Eyes: Negative for blurred vision, double vision, photophobia, pain, discharge and redness.  Respiratory: Negative for cough, hemoptysis, sputum production, shortness of breath, wheezing and stridor.   Cardiovascular: Negative for chest pain, palpitations, orthopnea, claudication and leg swelling.  Gastrointestinal: Negative for nausea, vomiting and abdominal pain. Negative for heartburn, constipation, blood in stool and melena.  Genitourinary: Negative for dysuria, urgency, frequency, hematuria and flank pain.  Musculoskeletal: Negative for myalgias, back pain, joint pain and falls.  Skin: Right foot swelling and redness.  Neurological: Negative for dizziness and weakness. Negative for tingling, tremors, sensory change, speech change, focal weakness, loss of consciousness and headaches.  Endo/Heme/Allergies: Negative for environmental allergies and polydipsia. Does not bruise/bleed easily.  Psychiatric/Behavioral: Negative for suicidal ideas. The patient is not nervous/anxious.      Past Medical History  Diagnosis Date  . Hypertension   . Cataract  both eyes  . Osteoarthritis   . Glaucoma   .  Poor circulation   . CAD (coronary artery disease)     a. 02/2001 PTCA & BMS to the OM1;  b. 08/2001 CBA to ISR in OM1, CBA of mid LAD;  c. 02/2002 Failed PCI of mLAD occlusion, LCX nl, patent OM1 stent, RCA dom, 40p, PDA/PL nl, EF 50% mild antlat HK.  Marland Kitchen Thyroid disease     hypothyroidism, hypothyroidism  . Hypertriglyceridemia     Past Surgical History  Procedure Laterality Date  . Abdominal hysterectomy  1972  . Eye surgery  2007/2008    cataracts  . Cardiac surgery  2001    stent placement  . Hernia repair  2009  . Gallbladder surgery  2009  . Hip arthroplasty  06/06/2012    Procedure: ARTHROPLASTY BIPOLAR HIP;  Surgeon: Eldred Manges, MD;  Location: [facility];  Service: Orthopedics;  Laterality: Right;  monopolar    Social History:  reports that she has never smoked. She has never used smokeless tobacco. She reports that she does not drink alcohol or use illicit drugs.  Allergies  Allergen Reactions  . Codeine Nausea And Vomiting  . Morphine And Related Nausea And Vomiting  . Penicillins Hives    Family History  Problem Relation Age of Onset  . Arthritis Father   . Stroke Father   . Hypertension Father   . Heart disease Sister   . Heart disease Brother   . Hypertension Brother   . Stroke Brother     Prior to Admission medications   Medication Sig Start Date End Date Taking? Authorizing Provider  acetaminophen (TYLENOL) 500 MG tablet Take 500 mg by mouth at bedtime. pain   Yes Historical Provider, MD  aspirin 81 MG chewable tablet Chew 81 mg by mouth daily.   Yes Historical Provider, MD  bisacodyl (DULCOLAX) 10 MG suppository Place 10 mg rectally as needed for moderate constipation (for constipation not relieved by milk of magnesium).   Yes Historical Provider, MD  clopidogrel (PLAVIX) 75 MG tablet Take 75 mg by mouth daily.    Yes Historical Provider, MD  Dorzolamide HCl-Timolol Mal PF 22.3-6.8 MG/ML SOLN Place 1 drop into both eyes every 12 (twelve) hours.    Yes  Historical Provider, MD  fish oil-omega-3 fatty acids 1000 MG capsule Take 1 g by mouth daily.   Yes Historical Provider, MD  furosemide (LASIX) 40 MG tablet Take 40 mg by mouth daily.   Yes Historical Provider, MD  latanoprost (XALATAN) 0.005 % ophthalmic solution Place 1 drop into both eyes at bedtime.   Yes Historical Provider, MD  Loperamide HCl (IMODIUM PO) Take 40 mg by mouth once as needed (loose stool). immodium AD caplet   Yes Historical Provider, MD  magnesium hydroxide (MILK OF MAGNESIA) 400 MG/5ML suspension Take 30 mLs by mouth daily as needed for mild constipation.   Yes Historical Provider, MD  metoprolol tartrate (LOPRESSOR) 25 MG tablet Take 25 mg by mouth 2 (two) times daily.   Yes Historical Provider, MD  Multiple Vitamins-Minerals (ICAPS) TABS Take 1 tablet by mouth daily.   Yes Historical Provider, MD  pantoprazole (PROTONIX) 40 MG tablet Take 1 tablet (40 mg total) by mouth daily. 10/27/13  Yes Rhonda G Barrett, PA-C  promethazine (PHENERGAN) 25 MG tablet Take 25 mg by mouth every 6 (six) hours as needed for nausea or vomiting.   Yes Historical Provider, MD  Sodium Phosphates (RA SALINE ENEMA) 19-7 GM/118ML ENEM  Place 1 enema rectally daily as needed (constipation not relieved by milk of magnesium, or bisacodyl suppository).   Yes Historical Provider, MD  traMADol (ULTRAM) 50 MG tablet Take 1 tablet (50 mg total) by mouth every 6 (six) hours as needed for pain. 07/27/12  Yes Loren Racer, MD    Physical Exam: Filed Vitals:   04/10/14 2217 04/10/14 2300 04/11/14 0000 04/11/14 0025  BP:  164/144 145/61   Pulse:  89 85   Temp: 100.4 F (38 C)   98.9 F (37.2 C)  TempSrc: Rectal   Oral  Resp:      Height:      Weight:      SpO2:  98% 93%     Physical Exam  Constitutional: Appears well-developed and well-nourished. No distress.  HENT: Normocephalic. External right and left ear normal. Oropharynx is clear and moist.  Eyes: Conjunctivae and EOM are normal. PERRLA, no  scleral icterus.  Neck: Normal ROM. Neck supple. No JVD. No tracheal deviation. No thyromegaly.  CVS: RRR, S1/S2 +, no murmurs, no gallops, no carotid bruit.  Pulmonary: Effort and breath sounds normal, no stridor, rhonchi, wheezes, rales.  Abdominal: Soft. BS +,  no distension, tenderness, rebound or guarding.  Musculoskeletal: Normal range of motion. Tenderness in right foot. Lymphadenopathy: No lymphadenopathy noted, cervical, inguinal. Neuro: Alert. No cranial nerve deficit. Skin: Skin is warm and dry. Right foot swelling, erythema and tenderness to palpation.  Psychiatric: Normal mood and affect. Behavior, judgment, thought content normal.   Labs on Admission:  Basic Metabolic Panel:  Recent Labs Lab 04/10/14 2150  NA 140  K 4.0  CL 99  CO2 28  GLUCOSE 111*  BUN 20  CREATININE 1.15*  CALCIUM 9.7   CBC:  Recent Labs Lab 04/10/14 2150  WBC 10.3  NEUTROABS 6.2  HGB 11.5*  HCT 34.8*  MCV 95.9  PLT 243   Cardiac Enzymes: No results found for this basename: CKTOTAL, CKMB, CKMBINDEX, TROPONINI,  in the last 168 hours BNP: No components found with this basename: POCBNP,  CBG: No results found for this basename: GLUCAP,  in the last 168 hours  Debbora Presto, MD  [facility] Pager [phone]  If 7PM-7AM, please contact night-coverage [email or web address] Password [facility] 04/11/2014, 12:26 AM

## 2014-04-11 NOTE — Evaluation (Signed)
Physical Therapy Evaluation Patient Details Name: [name] [name] MRN: [id number] DOB: October 19, 1918 Today's Date: 04/11/2014   History of Present Illness  80 y.o. female admitted with cellulitis, right foot with soft tissue injury and possible small fluctuant hematoma. Hx of CAD, HTN, acute renal failure, GERD, and glaucoma.  Clinical Impression  Pt admitted with above. Pt currently with functional limitations due to the deficits listed below (see PT Problem List). Unable to tolerate ambulation at this time secondary to pain of RLE. Able to transfer between reclining chair and BSC with min assist. Pt will benefit from skilled PT to increase their independence and safety with mobility to allow discharge to the venue listed below.      Follow Up Recommendations SNF;Supervision for mobility/OOB    Equipment Recommendations  None recommended by PT    Recommendations for Other Services       Precautions / Restrictions Precautions Precautions: Fall Restrictions Weight Bearing Restrictions: No      Mobility  Bed Mobility                  Transfers Overall transfer level: Needs assistance Equipment used: Rolling walker (2 wheeled) Transfers: Sit to/from [employer] Sit to Stand: Min assist Stand pivot transfers: Min assist       General transfer comment: Min assist for boost and with walker control for pivot transfer. Performed from recliner to [facility] and back to recliner. Sit<>stand x4. Assist for hand placement onto stable surface with max verbal cues. Pt able to bear minimal weight through RLE. Encouraged to use UEs for support.  Ambulation/Gait                Stairs            Wheelchair Mobility    Modified Rankin (Stroke Patients Only)       Balance Overall balance assessment: Needs assistance;History of Falls Sitting-balance support: No upper extremity supported;Feet supported Sitting balance-Leahy Scale: Fair     Standing balance  support: Single extremity supported Standing balance-Leahy Scale: Poor                               Pertinent Vitals/Pain Pain Assessment: 0-10 Pain Score: 0-No pain Pain Location: RLE Pain Intervention(s): Limited activity within patient's tolerance;Monitored during session;Repositioned    Home Living Family/patient expects to be discharged to:: Skilled nursing facility ([facility]) Living Arrangements: Alone Available Help at Discharge: Skilled Nursing Facility Type of Home: Skilled Nursing Facility         Home Equipment: Environmental consultant - 4 wheels      Prior Function Level of Independence: Needs assistance   Gait / Transfers Assistance Needed: Uses rollator for ambulation  ADL's / Homemaking Assistance Needed: Pt reports she could bath/ dress herself        Hand Dominance   Dominant Hand: Right    Extremity/Trunk Assessment   Upper Extremity Assessment: Defer to OT evaluation           Lower Extremity Assessment: RLE deficits/detail RLE Deficits / Details: Decreased strength and ROM - limited assessment due to pain. Edematous and erythema of RLE significantly so around foot.       Communication   Communication: No difficulties  Cognition Arousal/Alertness: Awake/alert Behavior During Therapy: WFL for tasks assessed/performed Overall Cognitive Status: Within Functional Limits for tasks assessed  General Comments General comments (skin integrity, edema, etc.): Edema and erythema of distal RLE.    Exercises        Assessment/Plan    PT Assessment Patient needs continued PT services  PT Diagnosis Difficulty walking;Abnormality of gait;Acute pain   PT Problem List Decreased strength;Decreased activity tolerance;Decreased range of motion;Decreased balance;Decreased mobility;Decreased knowledge of use of DME;Pain  PT Treatment Interventions DME instruction;Gait training;Functional mobility training;Therapeutic  activities;Therapeutic exercise;Balance training;Neuromuscular re-education;Patient/family education;Modalities   PT Goals (Current goals can be found in the Care Plan section) Acute Rehab PT Goals Patient Stated Goal: Go back to [facility] PT Goal Formulation: With patient Time For Goal Achievement: 04/18/14 Potential to Achieve Goals: Good    Frequency Min 3X/week   Barriers to discharge        Co-evaluation               End of Session   Activity Tolerance: Patient limited by pain Patient left: in chair;with call bell/phone within reach Nurse Communication: Mobility status         Time: [id number] PT Time Calculation (min): 32 min   Charges:   PT Evaluation $Initial PT Evaluation Tier I: 1 Procedure PT Treatments $Therapeutic Activity: 8-22 mins   PT G Codes:         Charlsie MerlesLogan Secor Mickenzie Stolar, [place]PT [phone]  [place]erton MountBarbour, Fronie Holstein S 04/11/2014, 1:00 PM

## 2014-04-11 NOTE — Progress Notes (Signed)
Status changed to DNR  [name] PrestoMAGICK-Kedrick Mcnamee, MD  [facility] Pager [phone]  If 7PM-7AM, please contact night-coverage [email or web address] Password [email or web address]

## 2014-04-12 LAB — GLUCOSE, CAPILLARY: GLUCOSE-CAPILLARY: 126 mg/dL — AB (ref 70–99)

## 2014-04-12 LAB — MRSA PCR SCREENING: MRSA by PCR: POSITIVE — AB

## 2014-04-12 MED ORDER — TRAMADOL HCL 50 MG PO TABS: 50.0000 mg | ORAL_TABLET | Freq: Four times a day (QID) | ORAL | Status: DC | PRN

## 2014-04-12 MED ORDER — OXYCODONE HCL 5 MG PO TABS: 5.0000 mg | ORAL_TABLET | ORAL | Status: DC | PRN

## 2014-04-12 NOTE — Progress Notes (Signed)
PT Cancellation Note  Patient Details Name: [name] [name] MRN: [id number] DOB: 10-25-1918   Cancelled Treatment:    Reason Eval/Treat Not Completed: Fatigue/lethargy limiting ability to participate. Patient attempting to rest at this time and daughter requesting to hold. Patient is planning to DC to SNF possibly. Will follow up as able.    [name], [name] 04/12/2014, 11:27 AM

## 2014-04-12 NOTE — Progress Notes (Signed)
[name] and [facility]. [facility] [phone]. Pt awaiting disposition.  Derrell Lollingoris Johathan Province, MSW Clinical Social Worker [phone]
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
## 2014-04-13 LAB — CBC
HEMATOCRIT: 31.7 % — AB (ref 36.0–46.0)
Hemoglobin: 10.1 g/dL — ABNORMAL LOW (ref 12.0–15.0)
MCH: 31.7 pg (ref 26.0–34.0)
MCHC: 31.9 g/dL (ref 30.0–36.0)
MCV: 99.4 fL (ref 78.0–100.0)
Platelets: 215 10*3/uL (ref 150–400)
RBC: 3.19 MIL/uL — AB (ref 3.87–5.11)
RDW: 13.2 % (ref 11.5–15.5)
WBC: 8.7 10*3/uL (ref 4.0–10.5)

## 2014-04-13 LAB — COMPREHENSIVE METABOLIC PANEL
ALT: 10 U/L (ref 0–35)
AST: 19 U/L (ref 0–37)
Albumin: 2.7 g/dL — ABNORMAL LOW (ref 3.5–5.2)
Alkaline Phosphatase: 72 U/L (ref 39–117)
Anion gap: 10 (ref 5–15)
BUN: 20 mg/dL (ref 6–23)
CALCIUM: 9.1 mg/dL (ref 8.4–10.5)
CHLORIDE: 99 meq/L (ref 96–112)
CO2: 28 mEq/L (ref 19–32)
CREATININE: 1.28 mg/dL — AB (ref 0.50–1.10)
GFR calc Af Amer: 41 mL/min — ABNORMAL LOW (ref >90)
GFR calc non Af Amer: 35 mL/min — ABNORMAL LOW (ref >90)
Glucose, Bld: 90 mg/dL (ref 70–99)
Potassium: 4.1 mEq/L (ref 3.7–5.3)
Sodium: 137 mEq/L (ref 137–147)
TOTAL PROTEIN: 6.9 g/dL (ref 6.0–8.3)
Total Bilirubin: 0.3 mg/dL (ref 0.3–1.2)

## 2014-04-13 LAB — GLUCOSE, CAPILLARY: Glucose-Capillary: 83 mg/dL (ref 70–99)

## 2014-04-13 MED ORDER — FENTANYL CITRATE 0.05 MG/ML IJ SOLN
25.0000 ug | INTRAMUSCULAR | Status: DC | PRN
  Administered 2014-04-13: 25 ug via INTRAMUSCULAR
  Filled 2014-04-13: qty 2

## 2014-04-13 MED ORDER — OXYCODONE HCL 5 MG PO TABS
10.0000 mg | ORAL_TABLET | ORAL | Status: DC | PRN
  Administered 2014-04-13: 10 mg via ORAL
  Administered 2014-04-13: 5 mg via ORAL
  Administered 2014-04-14: 10 mg via ORAL
  Filled 2014-04-13 (×3): qty 2

## 2014-04-13 NOTE — Progress Notes (Signed)
Physical Therapy Treatment Patient Details Name: Rudi CocoLena B Pratt MRN: [id number] DOB: 1919-07-04 Today's Date: 04/13/2014    History of Present Illness 80 y.o. female admitted with cellulitis, right foot with soft tissue injury and possible small fluctuant hematoma. Hx of CAD, HTN, acute renal failure, GERD, and glaucoma.    PT Comments    Patient very limited by anxiety regarding R foot pain. Patient required +2 assist today with stands and mobility. Patient at first wanting to attempt to stand without assistance, not understanding her deficits. Patient is planning to return back to SNF  Follow Up Recommendations  SNF;Supervision for mobility/OOB     Equipment Recommendations  None recommended by PT    Recommendations for Other Services       Precautions / Restrictions Precautions Precautions: Fall    Mobility  Bed Mobility Overal bed mobility: Needs Assistance Bed Mobility: Supine to Sit     Supine to sit: Mod assist     General bed mobility comments: Mod A for truncal support into sitting and use of chuck pad to scoot to EOB  Transfers Overall transfer level: Needs assistance Equipment used: Rolling walker (2 wheeled) Transfers: Sit to/from Stand Sit to Stand: +2 physical assistance;Max assist Stand pivot transfers: +2 physical assistance;Max assist       General transfer comment: Patient required +2 assistance to power up into standing and to ensure balance and safety. patient not wanting R LE to touch the ground making standing increasingly difficult due to imblance. Patient stood x4 and SPT [facility] bed [facility] then back to recliner. Cues and educated provided throughout  Ambulation/Gait             General Gait Details: unable   Stairs            Wheelchair Mobility    Modified Rankin (Stroke Patients Only)       Balance     Sitting balance-Leahy Scale: Poor       Standing balance-Leahy Scale: Zero                       Cognition Arousal/Alertness: Awake/alert Behavior During Therapy: WFL for tasks assessed/performed Overall Cognitive Status: Within Functional Limits for tasks assessed                      Exercises      General Comments        Pertinent Vitals/Pain Pain Assessment: 0-10 Pain Score: 5  Pain Location: R LE Pain Intervention(s): Limited activity within patient's tolerance;Monitored during session    Home Living                      Prior Function            PT Goals (current goals can now be found in the care plan section) Progress towards PT goals: Progressing toward goals    Frequency  Min 3X/week    PT Plan Current plan remains appropriate    Co-evaluation             End of Session Equipment Utilized During Treatment: Gait belt Activity Tolerance: Patient limited by pain Patient left: in chair;with call bell/phone within reach     Time: 1350-1431 PT Time Calculation (min): 41 min  Charges:  $Therapeutic Activity: 38-52 mins                    G Codes:  Fredrich Birks 04/13/2014, 3:09 PM 04/13/2014 Fredrich Birks PTA [phone] pager [phone] office

## 2014-04-13 NOTE — Progress Notes (Signed)
Patient ID: [name] [name], female   DOB: 04-18-1919, 80 y.o.   MRN: [id number] Foot examined this morning. Swelling cellulitis and pain is much improved. We'll have her continue with compression wraps okay for discharge at this time patient cellulitis has resolved nicely with IV antibiotics.

## 2014-04-13 NOTE — Progress Notes (Signed)
OT Cancellation Note  Patient Details Name: [name] [name] MRN: [id number] DOB: 11-19-1918   Cancelled Treatment:    Reason Eval/Treat Not Completed: Other (comment). Per chart [employer] has [facility] medical stability. OT will defer eval to that facility as they deem appropriate. Acute OT will sign off.  Evette GeorgesLeonard, Garett Tetzloff Eva [phone]  04/13/2014, 3:58 PM
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
## 2014-04-14 LAB — CBC
HCT: 32.6 % — ABNORMAL LOW (ref 36.0–46.0)
HEMOGLOBIN: 10.3 g/dL — AB (ref 12.0–15.0)
MCH: 31.4 pg (ref 26.0–34.0)
MCHC: 31.6 g/dL (ref 30.0–36.0)
MCV: 99.4 fL (ref 78.0–100.0)
PLATELETS: 255 10*3/uL (ref 150–400)
RBC: 3.28 MIL/uL — AB (ref 3.87–5.11)
RDW: 13.3 % (ref 11.5–15.5)
WBC: 9.6 10*3/uL (ref 4.0–10.5)

## 2014-04-14 LAB — BASIC METABOLIC PANEL
Anion gap: 10 (ref 5–15)
BUN: 21 mg/dL (ref 6–23)
CHLORIDE: 98 meq/L (ref 96–112)
CO2: 29 mEq/L (ref 19–32)
Calcium: 9.4 mg/dL (ref 8.4–10.5)
Creatinine, Ser: 1.39 mg/dL — ABNORMAL HIGH (ref 0.50–1.10)
GFR calc Af Amer: 37 mL/min — ABNORMAL LOW (ref >90)
GFR, EST NON AFRICAN AMERICAN: 32 mL/min — AB (ref >90)
GLUCOSE: 97 mg/dL (ref 70–99)
POTASSIUM: 4 meq/L (ref 3.7–5.3)
SODIUM: 137 meq/L (ref 137–147)

## 2014-04-14 MED ORDER — DOXYCYCLINE HYCLATE 100 MG PO CAPS: 100.0000 mg | ORAL_CAPSULE | Freq: Two times a day (BID) | ORAL | Status: AC
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
## 2014-04-14 NOTE — Progress Notes (Signed)
Patient for discharge to SNF bed at [facility]  today. Plans confirmed with patient and family who are in agreement. SNF is prepared for patient and we will  plan transfer via [facility].               Marland Kitchen.   Derrell Lollingoris Mitsugi Schrader, MSW Clinical Social Worker [phone]

## 2014-04-15 ENCOUNTER — Encounter: Payer: Self-pay | Admitting: Internal Medicine

## 2014-04-15 DIAGNOSIS — M25579 Pain in unspecified ankle and joints of unspecified foot: Secondary | ICD-10-CM | POA: Insufficient documentation

## 2014-04-17 LAB — CULTURE, BLOOD (ROUTINE X 2)
CULTURE: NO GROWTH
CULTURE: NO GROWTH

## 2014-04-19 ENCOUNTER — Encounter: Payer: Self-pay | Admitting: Internal Medicine

## 2014-04-19 ENCOUNTER — Non-Acute Institutional Stay (SKILLED_NURSING_FACILITY): Payer: Medicare Other | Admitting: Internal Medicine

## 2014-04-19 DIAGNOSIS — L02419 Cutaneous abscess of limb, unspecified: Secondary | ICD-10-CM

## 2014-04-19 DIAGNOSIS — I10 Essential (primary) hypertension: Secondary | ICD-10-CM

## 2014-04-19 DIAGNOSIS — E781 Pure hyperglyceridemia: Secondary | ICD-10-CM

## 2014-04-19 DIAGNOSIS — N289 Disorder of kidney and ureter, unspecified: Secondary | ICD-10-CM

## 2014-04-19 DIAGNOSIS — L03115 Cellulitis of right lower limb: Secondary | ICD-10-CM

## 2014-04-19 DIAGNOSIS — L03119 Cellulitis of unspecified part of limb: Secondary | ICD-10-CM

## 2014-04-19 DIAGNOSIS — I2510 Atherosclerotic heart disease of native coronary artery without angina pectoris: Secondary | ICD-10-CM

## 2014-04-19 NOTE — Assessment & Plan Note (Signed)
Continue fish oil 

## 2014-04-19 NOTE — Progress Notes (Signed)
MRN: [id number] Name: DONNE ROBILLARD  Sex: female Age: 80 y.o. DOB: [birth year]  [facility] #: [facility] Facility/Room:306 Level Of Care: SNF Provider: Merrilee Seashore D Emergency Contacts: Extended Emergency Contact Information Primary Emergency Contact: Wicker,Theresa Address: [address]          [place], [place] [address] [place] Home Phone: [phone] Relation: Daughter Secondary Emergency Contact: Garner,Cathy Address: [phone]          [place] [address] [place] Home Phone: [phone] Work Phone: [phone] Relation: Daughter  Code Status: DNR  Allergies: Codeine; Morphine and related; and Penicillins  Chief Complaint  Patient presents with  . New Admit To SNF    HPI: Patient is 80 y.o. female who is recovering from cellulitis R foot from a hematoma, admitted to SNF for OT/PT.  Past Medical History  Diagnosis Date  . Hypertension   . Cataract     both eyes  . Osteoarthritis   . Glaucoma   . Poor circulation   . CAD (coronary artery disease)     a. 02/2001 PTCA & BMS to the OM1;  b. 08/2001 CBA to ISR in OM1, CBA of mid LAD;  c. 02/2002 Failed PCI of mLAD occlusion, LCX nl, patent OM1 stent, RCA dom, 40p, PDA/PL nl, EF 50% mild antlat HK.  Marland Kitchen Thyroid disease     hypothyroidism, hypothyroidism  . Hypertriglyceridemia     Past Surgical History  Procedure Laterality Date  . Abdominal hysterectomy  1972  . Eye surgery  2007/2008    cataracts  . Cardiac surgery  2001    stent placement  . Hernia repair  2009  . Gallbladder surgery  2009  . Hip arthroplasty  06/06/2012    Procedure: ARTHROPLASTY BIPOLAR HIP;  Surgeon: Eldred Manges, MD;  Location: [facility];  Service: Orthopedics;  Laterality: Right;  monopolar      Medication List       This list is accurate as of: 04/19/14  7:17 PM.  Always use your most recent med list.               acetaminophen 500 MG tablet  Commonly known as:  TYLENOL  Take 500 mg by mouth at  bedtime. pain     aspirin 81 MG chewable tablet  Chew 81 mg by mouth daily.     bisacodyl 10 MG suppository  Commonly known as:  DULCOLAX  Place 10 mg rectally as needed for moderate constipation (for constipation not relieved by milk of magnesium).     clopidogrel 75 MG tablet  Commonly known as:  PLAVIX  Take 75 mg by mouth daily.     Dorzolamide HCl-Timolol Mal PF 22.3-6.8 MG/ML Soln  Place 1 drop into both eyes every 12 (twelve) hours.     doxycycline 100 MG capsule  Commonly known as:  VIBRAMYCIN  Take 1 capsule (100 mg total) by mouth 2 (two) times daily.     fish oil-omega-3 fatty acids 1000 MG capsule  Take 1 g by mouth daily.     furosemide 40 MG tablet  Commonly known as:  LASIX  Take 40 mg by mouth daily.     ICAPS Tabs  Take 1 tablet by mouth daily.     latanoprost 0.005 % ophthalmic solution  Commonly known as:  XALATAN  Place 1 drop into both eyes at bedtime.     magnesium hydroxide 400 MG/5ML suspension  Commonly known as:  MILK OF MAGNESIA  Take 30 mLs by mouth daily as needed for mild constipation.     metoprolol tartrate 25 MG tablet  Commonly known as:  LOPRESSOR  Take 25 mg by mouth 2 (two) times daily.     oxyCODONE 5 MG immediate release tablet  Commonly known as:  ROXICODONE  Take 1 tablet (5 mg total) by mouth every 4 (four) hours as needed for severe pain.     pantoprazole 40 MG tablet  Commonly known as:  PROTONIX  Take 1 tablet (40 mg total) by mouth daily.     promethazine 25 MG tablet  Commonly known as:  PHENERGAN  Take 25 mg by mouth every 6 (six) hours as needed for nausea or vomiting.     RA SALINE ENEMA 19-7 GM/118ML Enem  Place 1 enema rectally daily as needed (constipation not relieved by milk of magnesium, or bisacodyl suppository).     traMADol 50 MG tablet  Commonly known as:  ULTRAM  Take 1 tablet (50 mg total) by mouth every 6 (six) hours as needed.        No orders of the defined types were placed in this  encounter.    Immunization History  Administered Date(s) Administered  . Influenza Whole 05/30/2013    History  Substance Use Topics  . Smoking status: Never Smoker   . Smokeless tobacco: Never Used  . Alcohol Use: No    Family history is noncontributory    Review of Systems  DATA OBTAINED: from patient; no c/o GENERAL: Feels well no fevers, fatigue, appetite changes SKIN: No itching, rash or wounds EYES: No eye pain, redness, discharge EARS: No earache, tinnitus, change in hearing NOSE: No congestion, drainage or bleeding  MOUTH/THROAT: No mouth or tooth pain, No sore throat RESPIRATORY: No cough, wheezing, SOB CARDIAC: No chest pain, palpitations, lower extremity edema  GI: No abdominal pain, No N/V/D or constipation, No heartburn or reflux  GU: No dysuria, frequency or urgency, or incontinence  MUSCULOSKELETAL: No unrelieved bone/joint pain NEUROLOGIC: No headache, dizziness or focal weakness PSYCHIATRIC: No overt anxiety or sadness. Sleeps well. No behavior issue.   Filed Vitals:   04/19/14 1906  BP: 126/62  Pulse: 61  Temp: 98.2 F (36.8 C)  Resp: 20    Physical Exam  GENERAL APPEARANCE: Alert, conversant. Appropriately groomed. No acute distress.  SKIN: No diaphoresis rash; r foot with compression dressing HEAD: Normocephalic, atraumatic  EYES: Conjunctiva/lids clear. Pupils round, reactive. EOMs intact.  EARS: External exam WNL, canals clear. Hearing grossly normal.  NOSE: No deformity or discharge.  MOUTH/THROAT: Lips w/o lesions  RESPIRATORY: Breathing is even, unlabored. Lung sounds are clear   CARDIOVASCULAR: Heart RRR no murmurs, rubs or gallops. No peripheral edema.  GASTROINTESTINAL: Abdomen is soft, non-tender, not distended w/ normal bowel sounds GENITOURINARY: Bladder non tender, not distended  MUSCULOSKELETAL: No abnormal joints or musculature NEUROLOGIC: . Cranial nerves 2-12 grossly intact. Moves all extremities no tremor. PSYCHIATRIC:  Mood and affect appropriate to situation, no behavioral issues  Patient Active Problem List   Diagnosis Date Noted  . Hypertriglyceridemia   . Pain in joint, ankle and foot 04/15/2014  . Cellulitis 04/10/2014  . Pure hyperglyceridemia 12/22/2013  . Renal insufficiency 11/06/2013  . Chest pain 10/26/2013  . Epigastric pain 10/26/2013  . OA (osteoarthritis) 10/03/2013  . Edema 03/03/2013  . Encounter for long-term (current) use of other medications 01/27/2013  . Essential hypertension, benign 01/05/2013  . Coronary atherosclerosis of native coronary artery 01/05/2013  . Anemia of  other chronic disease 01/05/2013  . Postoperative anemia 06/07/2012  . UTI (lower urinary tract infection) 06/06/2012  . Glaucoma 06/06/2012  . Fall at home 06/05/2012  . Hip fracture 06/05/2012  . CAD (coronary artery disease) 06/05/2012  . Platelet inhibition due to Plavix 06/05/2012  . Hypertension     CBC    Component Value Date/Time   WBC 9.6 04/14/2014 0453   RBC 3.28* 04/14/2014 0453   HGB 10.3* 04/14/2014 0453   HCT 32.6* 04/14/2014 0453   PLT 255 04/14/2014 0453   MCV 99.4 04/14/2014 0453   LYMPHSABS 2.6 04/11/2014 0340   MONOABS 1.1* 04/11/2014 0340   EOSABS 0.3 04/11/2014 0340   BASOSABS 0.0 04/11/2014 0340    CMP     Component Value Date/Time   NA 137 04/14/2014 0453   K 4.0 04/14/2014 0453   CL 98 04/14/2014 0453   CO2 29 04/14/2014 0453   GLUCOSE 97 04/14/2014 0453   BUN 21 04/14/2014 0453   CREATININE 1.39* 04/14/2014 0453   CALCIUM 9.4 04/14/2014 0453   PROT 6.9 04/13/2014 0523   ALBUMIN 2.7* 04/13/2014 0523   AST 19 04/13/2014 0523   ALT 10 04/13/2014 0523   ALKPHOS 72 04/13/2014 0523   BILITOT 0.3 04/13/2014 0523   GFRNONAA 32* 04/14/2014 0453   GFRAA 37* 04/14/2014 0453    Assessment and Plan  Cellulitis right foot with soft tissue injury and possible small fluctuant hematoma  Placed on IV vancomycin for presumed cellulitis initially, right foot soft tissue ultrasound obtained  consistent with hematoma, . Consulted , Dr. Lajoyce Corners orthopedics who saw the patient , recommended compression wraps and okay for discharge  Surgery recommends to elevate the foot, Ace wrap, continue antibiotics, patient transition to by mouth doxycycline for one more week  Tramadol and oxycodone for pain control  Initial x-rays did not show any fracture  Patient to follow up with orthopedics in one week   CAD (coronary artery disease) Stable, continue aspirin, beta blocker and Plavix   Essential hypertension, benign Continue bblocker and lasix  Pure hyperglyceridemia Continue fish oil  Renal insufficiency Perhaps exacerbated by LasixCreatinine has been stable, slightly increased from 1.09-1.39 Recommend to repeat BMP in one week and then resume Lasix if creatinine is stable     Margit Hanks, MD

## 2014-04-19 NOTE — Assessment & Plan Note (Signed)
Perhaps exacerbated by LasixCreatinine has been stable, slightly increased from 1.09-1.39 Recommend to repeat BMP in one week and then resume Lasix if creatinine is stable

## 2014-04-19 NOTE — Assessment & Plan Note (Signed)
Stable, continue aspirin, beta blocker and Plavix

## 2014-04-19 NOTE — Assessment & Plan Note (Signed)
right foot with soft tissue injury and possible small fluctuant hematoma  Placed on IV vancomycin for presumed cellulitis initially, right foot soft tissue ultrasound obtained consistent with hematoma, . Consulted , Dr. Lajoyce Corners orthopedics who saw the patient , recommended compression wraps and okay for discharge  Surgery recommends to elevate the foot, Ace wrap, continue antibiotics, patient transition to by mouth doxycycline for one more week  Tramadol and oxycodone for pain control  Initial x-rays did not show any fracture  Patient to follow up with orthopedics in one week

## 2014-04-19 NOTE — Assessment & Plan Note (Signed)
Continue bblocker and lasix

## 2014-05-22 ENCOUNTER — Encounter: Payer: Self-pay | Admitting: Internal Medicine

## 2014-06-26 ENCOUNTER — Encounter: Payer: Self-pay | Admitting: Internal Medicine

## 2014-07-11 ENCOUNTER — Non-Acute Institutional Stay (SKILLED_NURSING_FACILITY): Payer: Medicare Other | Admitting: Internal Medicine

## 2014-07-11 DIAGNOSIS — S91302S Unspecified open wound, left foot, sequela: Secondary | ICD-10-CM

## 2014-07-13 NOTE — Progress Notes (Addendum)
Patient ID: Rudi CocoLena B Antonio, female   DOB: [birth year], 80 y.o.   MRN: [id number]               PROGRESS NOTE  DATE:  07/11/2014    [facility]    LEVEL OF CARE:   SNF   Acute Visit   CHIEF COMPLAINT:  Review of left foot wound.    HISTORY OF PRESENT ILLNESS:  Mrs. Mccallum apparently traumatized the outer aspect of her left foot on the wheelchair.  This developed into a hematoma which was opened in the facility.  This was some months ago.  It has been gradually progressing and improving.  Still, some drainage is present.  I was asked to look at this today.    PHYSICAL EXAMINATION:   SKIN:  INSPECTION:  On the lateral aspect of her left foot is a roughly 3/4 in x 1/2 in wound.  This had some gritty fibrous eschar to it.  This was debrided using a #15 scalpel.  She tolerated this well.  The post debridement condition of the wound was improved with a better-looking granulating base.     ASSESSMENT/PLAN:    Traumatic wound initially on the left foot.  This was a hematoma.  This actually looks quite good.  I would use Santyl to this until the surface of the wound is improved, then change to a collagen-based dressing.   There is no evidence of infection here.

## 2014-07-24 ENCOUNTER — Non-Acute Institutional Stay (SKILLED_NURSING_FACILITY): Payer: Medicare Other | Admitting: Nurse Practitioner

## 2014-07-24 DIAGNOSIS — M1990 Unspecified osteoarthritis, unspecified site: Secondary | ICD-10-CM

## 2014-07-24 DIAGNOSIS — I2510 Atherosclerotic heart disease of native coronary artery without angina pectoris: Secondary | ICD-10-CM

## 2014-07-24 DIAGNOSIS — N289 Disorder of kidney and ureter, unspecified: Secondary | ICD-10-CM

## 2014-07-24 DIAGNOSIS — I10 Essential (primary) hypertension: Secondary | ICD-10-CM

## 2014-07-24 DIAGNOSIS — H409 Unspecified glaucoma: Secondary | ICD-10-CM

## 2014-07-24 DIAGNOSIS — R609 Edema, unspecified: Secondary | ICD-10-CM

## 2014-07-24 DIAGNOSIS — D649 Anemia, unspecified: Secondary | ICD-10-CM

## 2014-07-24 NOTE — Progress Notes (Signed)
Patient ID: [name] [name], female   DOB: May 15, 1919, 80 y.o.   MRN: [id number]    Nursing Home Location:  [facility]   Place of Service: SNF (31)  PCP: Kaleen MaskELKINS,WILSON OLIVER, MD  Allergies  Allergen Reactions  . Codeine Nausea And Vomiting  . Morphine And Related Nausea And Vomiting  . Penicillins Hives    Chief Complaint  Patient presents with  . Medical Management of Chronic Issues    HPI:  Patient is a 80 y.o. female [facility] for routine follow up on chronic conditions. Pt with pmh of CAD, LE edema, HTN, Renal Insufficiency, OA, glaucoma. Pt has been in her usual state of health over the last month. conts to follow up with wound care doctor due to open area on right foot. Dressing CDI today. Treatment nurse and staff following and report area is healing. Pt has no complaints or concerns during today's visit. Reports she feels pretty good for 89.  Review of Systems:  Review of Systems  Constitutional: Negative for activity change, appetite change, fatigue and unexpected weight change.  HENT: Negative for congestion.   Eyes: Positive for visual disturbance (hx of glaucoma).  Respiratory: Negative for cough and shortness of breath.   Cardiovascular: Negative for chest pain, palpitations and leg swelling.  Gastrointestinal: Negative for abdominal pain, diarrhea and constipation.  Genitourinary: Negative for dysuria and difficulty urinating.  Musculoskeletal: Negative for myalgias and arthralgias.  Skin: Positive for wound (to right foot, keeps leg elevated to avoid swelling). Negative for color change.  Neurological: Negative for dizziness and weakness.  Psychiatric/Behavioral: Negative for behavioral problems, confusion and agitation.    Past Medical History  Diagnosis Date  . Hypertension   . Cataract     both eyes  . Osteoarthritis   . Glaucoma   . Poor circulation   . CAD (coronary artery disease)    a. 02/2001 PTCA & BMS to the OM1;  b. 08/2001 CBA to ISR in OM1, CBA of mid LAD;  c. 02/2002 Failed PCI of mLAD occlusion, LCX nl, patent OM1 stent, RCA dom, 40p, PDA/PL nl, EF 50% mild antlat HK.  Marland Kitchen. Thyroid disease     hypothyroidism, hypothyroidism  . Hypertriglyceridemia    Past Surgical History  Procedure Laterality Date  . Abdominal hysterectomy  1972  . Eye surgery  2007/2008    cataracts  . Cardiac surgery  2001    stent placement  . Hernia repair  2009  . Gallbladder surgery  2009  . Hip arthroplasty  06/06/2012    Procedure: ARTHROPLASTY BIPOLAR HIP;  Surgeon: Eldred MangesMark C Yates, MD;  Location: [facility];  Service: Orthopedics;  Laterality: Right;  monopolar   Social History:   reports that she has never smoked. She has never used smokeless tobacco. She reports that she does not drink alcohol or use illicit drugs.  Family History  Problem Relation Age of Onset  . Arthritis Father   . Stroke Father   . Hypertension Father   . Heart disease Sister   . Heart disease Brother   . Hypertension Brother   . Stroke Brother     Medications: Patient's Medications  New Prescriptions   No medications on file  Previous Medications   ACETAMINOPHEN (TYLENOL) 500 MG TABLET    Take 500 mg by mouth at bedtime. pain   ASPIRIN 81 MG CHEWABLE TABLET    Chew 81 mg by mouth daily.   BISACODYL (DULCOLAX)  10 MG SUPPOSITORY    Place 10 mg rectally as needed for moderate constipation (for constipation not relieved by milk of magnesium).   CLOPIDOGREL (PLAVIX) 75 MG TABLET    Take 75 mg by mouth daily.    DORZOLAMIDE HCL-TIMOLOL MAL PF 22.3-6.8 MG/ML SOLN    Place 1 drop into both eyes every 12 (twelve) hours.    FISH OIL-OMEGA-3 FATTY ACIDS 1000 MG CAPSULE    Take 1 g by mouth daily.   FUROSEMIDE (LASIX) 40 MG TABLET    Take 40 mg by mouth daily.   LATANOPROST (XALATAN) 0.005 % OPHTHALMIC SOLUTION    Place 1 drop into both eyes at bedtime.   MAGNESIUM HYDROXIDE (MILK OF MAGNESIA) 400 MG/5ML SUSPENSION     Take 30 mLs by mouth daily as needed for mild constipation.   METOPROLOL TARTRATE (LOPRESSOR) 25 MG TABLET    Take 25 mg by mouth 2 (two) times daily.   MULTIPLE VITAMINS-MINERALS (ICAPS) TABS    Take 1 tablet by mouth daily.   OXYCODONE (ROXICODONE) 5 MG IMMEDIATE RELEASE TABLET    Take 1 tablet (5 mg total) by mouth every 4 (four) hours as needed for severe pain.   PANTOPRAZOLE (PROTONIX) 40 MG TABLET    Take 1 tablet (40 mg total) by mouth daily.   PROMETHAZINE (PHENERGAN) 25 MG TABLET    Take 25 mg by mouth every 6 (six) hours as needed for nausea or vomiting.   SODIUM PHOSPHATES (RA SALINE ENEMA) 19-7 GM/118ML ENEM    Place 1 enema rectally daily as needed (constipation not relieved by milk of magnesium, or bisacodyl suppository).   TRAMADOL (ULTRAM) 50 MG TABLET    Take 1 tablet (50 mg total) by mouth every 6 (six) hours as needed.  Modified Medications   No medications on file  Discontinued Medications   No medications on file     Physical Exam: Filed Vitals:   07/24/14 1504  BP: 138/61  Pulse: 57  Temp: 98.5 F (36.9 C)  Resp: 18  Weight: 167 lb (75.751 kg)    Physical Exam  Constitutional: She is oriented to person, place, and time. She appears well-developed and well-nourished. No distress.  HENT:  Mouth/Throat: Oropharynx is clear and moist. No oropharyngeal exudate.  Neck: Normal range of motion. Neck supple.  Cardiovascular: Normal rate, regular rhythm, normal heart sounds and intact distal pulses.   Pulmonary/Chest: Effort normal and breath sounds normal.  Abdominal: Soft. Bowel sounds are normal. She exhibits no distension. There is no tenderness.  Musculoskeletal: Normal range of motion.  Neurological: She is alert and oriented to person, place, and time.  Skin: Skin is warm and dry.  Psychiatric: She has a normal mood and affect.    Labs reviewed: Basic Metabolic Panel:  Recent Labs  08/65/7808/18/15 0340 04/13/14 0523 04/14/14 0453  NA 141 137 137  K 3.8  4.1 4.0  CL 101 99 98  CO2 28 28 29   GLUCOSE 108* 90 97  BUN 19 20 21   CREATININE 1.09 1.28* 1.39*  CALCIUM 9.2 9.1 9.4  MG 1.8  --   --   PHOS 3.1  --   --    Liver Function Tests:  Recent Labs  04/11/14 0340 04/13/14 0523  AST 19 19  ALT 12 10  ALKPHOS 78 72  BILITOT 0.5 0.3  PROT 7.1 6.9  ALBUMIN 3.0* 2.7*   No results for input(s): LIPASE, AMYLASE in the last 8760 hours. No results for input(s): AMMONIA in  the last 8760 hours. CBC:  Recent Labs  04/10/14 2150 04/11/14 0340 04/13/14 0523 04/14/14 0453  WBC 10.3 9.4 8.7 9.6  NEUTROABS 6.2 5.5  --   --   HGB 11.5* 10.5* 10.1* 10.3*  HCT 34.8* 32.4* 31.7* 32.6*  MCV 95.9 95.9 99.4 99.4  PLT 243 243 215 255   TSH:  Recent Labs  10/26/13 1842 04/11/14 0340  TSH 6.149* 4.220   A1C: No results found for: HGBA1C Lipid Panel:  Recent Labs  10/27/13 0531  CHOL 199  HDL 38*  LDLCALC 120*  TRIG 204*  CHOLHDL 5.2      Assessment/Plan 1. Renal insufficiency -follow up bmp  2. Anemia, unspecified anemia type Will follow up cbc  3. Coronary artery disease involving native coronary artery of native heart without angina pectoris -no reports of chest pains or shortness of breath -conts on plavix and ASA -follow up cbc  4. Right foot wound Improved, conts to be followed by treatment nurse  5. Essential hypertension, benign -blood pressure controlled on current medications, conts lopressor   6. Osteoarthritis, unspecified osteoarthritis type, unspecified site -no complaints of pain at this time, conts PRNs  7. Glaucoma conts on scheduled drops  8. Edema -no edema noted at this time, conts on lasix  -will follow up bmp

## 2014-07-27 ENCOUNTER — Encounter: Payer: Self-pay | Admitting: Internal Medicine

## 2014-07-27 ENCOUNTER — Non-Acute Institutional Stay (SKILLED_NURSING_FACILITY): Payer: Medicare Other | Admitting: Internal Medicine

## 2014-07-27 DIAGNOSIS — I10 Essential (primary) hypertension: Secondary | ICD-10-CM

## 2014-07-27 DIAGNOSIS — R609 Edema, unspecified: Secondary | ICD-10-CM

## 2014-07-27 DIAGNOSIS — N289 Disorder of kidney and ureter, unspecified: Secondary | ICD-10-CM

## 2014-07-27 NOTE — Progress Notes (Signed)
Patient ID: [name] CocoLena B [name], female   DOB: 1918-08-26, 80 y.o.   MRN: [id number] Patient ID: [name] [name], female   DOB: 1918-08-26, 80 y.o.   MRN: [id number]    Nursing Home Location:  [facility]   Place of Service: SNF (31)   Allergies  Allergen Reactions  . Codeine Nausea And Vomiting  . Morphine And Related Nausea And Vomiting  . Penicillins Hives    Chief Complaint  Patient presents with  . Acute Visit   to follow-up renal insufficiency  HPI:  Patient is a 80 y.o. female seen [facility] at [facility] for follow-up of renal insufficiency-Baseline creatinine appears to be in the mid to lower ones-recent metabolic panel done routinely shows a creatinine of 1.6 and BUN of 32 this is  slightly elevated from her baseline which appears to be in the low ones-per nursing staff she eats and drinks well in fact she has some water at her bedside this afternoon-nursing staff does not report any acute issues -- was seen recently for routine visit and appears to be stable  From what I can see per chart review she is on Lasix essentially for edema possibly blood pressure control   Review of Systems:  Review of Systems  Constitutional: Negative for activity change, appetite change, fatigue and unexpected weight change.  HENT: Negative for congestion.   Eyes: Positive for visual disturbance (hx of glaucoma).  Respiratory: Negative for cough and shortness of breath.   Cardiovascular: Negative for chest pain, palpitations and leg swelling.  Gastrointestinal: Negative for abdominal pain, diarrhea and constipation.  Genitourinary: Negative for dysuria and difficulty urinating.  Musculoskeletal: Negative for myalgias and arthralgias.  Skin: Positive for wound (to right foot, keeps leg elevated to avoid swelling). Negative for color change.--This is followed by wound care and apparently is quite stable  Neurological: Negative for dizziness and weakness.    Psychiatric/Behavioral: Negative for behavioral problems, confusion and agitation.    Past Medical History  Diagnosis Date  . Hypertension   . Cataract     both eyes  . Osteoarthritis   . Glaucoma   . Poor circulation   . CAD (coronary artery disease)     a. 02/2001 PTCA & BMS to the OM1;  b. 08/2001 CBA to ISR in OM1, CBA of mid LAD;  c. 02/2002 Failed PCI of mLAD occlusion, LCX nl, patent OM1 stent, RCA dom, 40p, PDA/PL nl, EF 50% mild antlat HK.  Marland Kitchen. Thyroid disease     hypothyroidism, hypothyroidism  . Hypertriglyceridemia    Past Surgical History  Procedure Laterality Date  . Abdominal hysterectomy  1972  . Eye surgery  2007/2008    cataracts  . Cardiac surgery  2001    stent placement  . Hernia repair  2009  . Gallbladder surgery  2009  . Hip arthroplasty  06/06/2012    Procedure: ARTHROPLASTY BIPOLAR HIP;  Surgeon: Eldred MangesMark C Yates, MD;  Location: [facility];  Service: Orthopedics;  Laterality: Right;  monopolar   Social History:   reports that she has never smoked. She has never used smokeless tobacco. She reports that she does not drink alcohol or use illicit drugs.  Family History  Problem Relation Age of Onset  . Arthritis Father   . Stroke Father   . Hypertension Father   . Heart disease Sister   . Heart disease Brother   . Hypertension Brother   . Stroke Brother     Medications:  Patient's Medications  New Prescriptions   No medications on file  Previous Medications   ACETAMINOPHEN (TYLENOL) 500 MG TABLET    Take 500 mg by mouth at bedtime. pain   ASPIRIN 81 MG CHEWABLE TABLET    Chew 81 mg by mouth daily.   BISACODYL (DULCOLAX) 10 MG SUPPOSITORY    Place 10 mg rectally as needed for moderate constipation (for constipation not relieved by milk of magnesium).   CLOPIDOGREL (PLAVIX) 75 MG TABLET    Take 75 mg by mouth daily.    DORZOLAMIDE HCL-TIMOLOL MAL PF 22.3-6.8 MG/ML SOLN    Place 1 drop into both eyes every 12 (twelve) hours.    FISH OIL-OMEGA-3 FATTY ACIDS  1000 MG CAPSULE    Take 1 g by mouth daily.   FUROSEMIDE (LASIX) 40 MG TABLET    Take 40 mg by mouth daily.   LATANOPROST (XALATAN) 0.005 % OPHTHALMIC SOLUTION    Place 1 drop into both eyes at bedtime.   MAGNESIUM HYDROXIDE (MILK OF MAGNESIA) 400 MG/5ML SUSPENSION    Take 30 mLs by mouth daily as needed for mild constipation.   METOPROLOL TARTRATE (LOPRESSOR) 25 MG TABLET    Take 25 mg by mouth 2 (two) times daily.   MULTIPLE VITAMINS-MINERALS (ICAPS) TABS    Take 1 tablet by mouth daily.   OXYCODONE (ROXICODONE) 5 MG IMMEDIATE RELEASE TABLET    Take 1 tablet (5 mg total) by mouth every 4 (four) hours as needed for severe pain.   PANTOPRAZOLE (PROTONIX) 40 MG TABLET    Take 1 tablet (40 mg total) by mouth daily.   PROMETHAZINE (PHENERGAN) 25 MG TABLET    Take 25 mg by mouth every 6 (six) hours as needed for nausea or vomiting.   SODIUM PHOSPHATES (RA SALINE ENEMA) 19-7 GM/118ML ENEM    Place 1 enema rectally daily as needed (constipation not relieved by milk of magnesium, or bisacodyl suppository).   TRAMADOL (ULTRAM) 50 MG TABLET    Take 1 tablet (50 mg total) by mouth every 6 (six) hours as needed.  Modified Medications   No medications on file  Discontinued Medications   No medications on file     Physical Exam: There were no vitals filed for this visit.  Physical Exam  Temperature 97.1 pulse 67 respirations 18 blood pressure 125/53 Constitutional: She is oriented to person, place, and time. She appears well-developed and well-nourished. No distress.  HENT:  Mouth/Throat: Oropharynx is clear and moist. No oropharyngeal exudate.  Neck: Normal range of motion. Neck supple.  Cardiovascular: Normal rate, regular rhythm, normal heart sounds someminimal lower extremity edema mainly in her feet   Pulmonary/Chest: Effort normal and breath sounds normal.  Abdominal: Soft. Bowel sounds are normal. She exhibits no distension. There is no tenderness.  Musculoskeletal: Normal range of motion.    Neurological: She is alert and oriented to person, place, and time.  Skin: Skin is warm and dry.  Psychiatric: She has a normal mood and affect.    Labs reviewed:  07/25/2014.  Sodium 138 potassium 4.2 BUN 32 creatinine 1.6.  CBC.  WBC 8.8 hemoglobin 11.2 platelets 274   Basic Metabolic Panel:  Recent Labs  40/98/1108/18/15 0340 04/13/14 0523 04/14/14 0453  NA 141 137 137  K 3.8 4.1 4.0  CL 101 99 98  CO2 28 28 29   GLUCOSE 108* 90 97  BUN 19 20 21   CREATININE 1.09 1.28* 1.39*  CALCIUM 9.2 9.1 9.4  MG 1.8  --   --  PHOS 3.1  --   --    Liver Function Tests:  Recent Labs  04/11/14 0340 04/13/14 0523  AST 19 19  ALT 12 10  ALKPHOS 78 72  BILITOT 0.5 0.3  PROT 7.1 6.9  ALBUMIN 3.0* 2.7*   No results for input(s): LIPASE, AMYLASE in the last 8760 hours. No results for input(s): AMMONIA in the last 8760 hours. CBC:  Recent Labs  04/10/14 2150 04/11/14 0340 04/13/14 0523 04/14/14 0453  WBC 10.3 9.4 8.7 9.6  NEUTROABS 6.2 5.5  --   --   HGB 11.5* 10.5* 10.1* 10.3*  HCT 34.8* 32.4* 31.7* 32.6*  MCV 95.9 95.9 99.4 99.4  PLT 243 243 215 255   TSH:  Recent Labs  10/26/13 1842 04/11/14 0340  TSH 6.149* 4.220   A1C: No results found for: HGBA1C Lipid Panel:  Recent Labs  10/27/13 0531  CHOL 199  HDL 38*  LDLCALC 120*  TRIG 204*  CHOLHDL 5.2      Assessment/Plan 1. Renal insufficiency -Creatinine has crept up since last lab-we will reduce her diuretic to 20 mg a day--to monitor weights twice a week for now-clinically she appears stable   2. Anemia, unspecified anemia type  appears stable per recent CBC  3. Coronary artery disease involving native coronary artery of native heart without angina pectoris -no reports of chest pains or shortness of breath -conts on plavix and ASA -CBC shows stable hemoglobin 11.2 platelets of 274  4. Right foot wound Improved, conts to be followed by treatment nurse  5. Essential hypertension,  benign -blood pressure controlled on current medications, conts lopressor --since Lasix reduced will check blood pressures daily keep an eye on this  6. Osteoarthritis, unspecified osteoarthritis type, unspecified site -no complaints of pain at this time, conts PRNs  7. Glaucoma conts on scheduled drops  8. Edema -minimal edema noted at this time, conts on lasix reduced secondary to renal issues -will follow up bmp next week  [id number]-of note more than 25 minutes spent assessing patient-reviewing her chart including history of Lasix use and renal function-and coordinating and formulating a plan of care-of note greater than 50% of time spent coordinating plan of care

## 2014-09-21 ENCOUNTER — Non-Acute Institutional Stay (SKILLED_NURSING_FACILITY): Payer: Medicare Other | Admitting: Internal Medicine

## 2014-09-21 DIAGNOSIS — R05 Cough: Secondary | ICD-10-CM

## 2014-09-21 DIAGNOSIS — M150 Primary generalized (osteo)arthritis: Secondary | ICD-10-CM

## 2014-09-21 DIAGNOSIS — N289 Disorder of kidney and ureter, unspecified: Secondary | ICD-10-CM

## 2014-09-21 DIAGNOSIS — R059 Cough, unspecified: Secondary | ICD-10-CM

## 2014-09-21 DIAGNOSIS — M159 Polyosteoarthritis, unspecified: Secondary | ICD-10-CM

## 2014-09-21 DIAGNOSIS — I10 Essential (primary) hypertension: Secondary | ICD-10-CM

## 2014-09-21 DIAGNOSIS — H409 Unspecified glaucoma: Secondary | ICD-10-CM

## 2014-09-21 DIAGNOSIS — I2510 Atherosclerotic heart disease of native coronary artery without angina pectoris: Secondary | ICD-10-CM

## 2014-09-21 DIAGNOSIS — R609 Edema, unspecified: Secondary | ICD-10-CM

## 2014-09-21 NOTE — Progress Notes (Signed)
Patient ID: [name] [name], female   DOB: February 26, 1919, 81 y.o.   MRN: [id number]   this is a routine-acute  visit.  Level care skilled.  Facility [facility].   Chief Complaint  Patient presents with  . Medical Management of Chronic Issues-- acute visit follow-up sore throat-nonproductive cough    HPI:  Patient is a 81 y.o. female seen today at [facility] for routine follow up on chronic conditions as well as recent complaints of a sore throat and nonproductive cough. Pt with pmh of CAD, LE edema, HTN, Renal Insufficiency, OA, glaucoma. Pt has been in her usual state of health over the last month. conts to follow up with wound care  due to open area on right foot- this appears to have improved significantly. D. Treatment nurse and staff following  she had complain of a sore throat but today says that's better however is having a nonproductive cough she does not complain of any increased shortness of breath from baseline.  Review of Systems:  Review of Systems  Constitutional: Negative for activity change, appetite change, fatigue and unexpected weight change.  HENT: Negative for congestion.   Eyes: Positive for visual disturbance (hx of glaucoma).  Respiratory: Positive for cough--negative shortness of breath.   Cardiovascular: Negative for chest pain, palpitations and leg swelling-- does have some mild edema of her right foot which is baseline.  Gastrointestinal: Negative for abdominal pain, diarrhea and constipation.  Genitourinary: Negative for dysuria and difficulty urinating.  Musculoskeletal: Negative for myalgias and arthralgias.  Skin: Positive for wound (to right foot, keeps leg elevated to avoid swelling). Negative for color change.  Neurological: Negative for dizziness and weakness.  Psychiatric/Behavioral: Negative for behavioral problems, confusion and agitation.    Past Medical History  Diagnosis Date  . Hypertension   . Cataract     both eyes  .  Osteoarthritis   . Glaucoma   . Poor circulation   . CAD (coronary artery disease)     a. 02/2001 PTCA & BMS to the OM1;  b. 08/2001 CBA to ISR in OM1, CBA of mid LAD;  c. 02/2002 Failed PCI of mLAD occlusion, LCX nl, patent OM1 stent, RCA dom, 40p, PDA/PL nl, EF 50% mild antlat HK.  Marland Kitchen. Thyroid disease     hypothyroidism, hypothyroidism  . Hypertriglyceridemia    Past Surgical History  Procedure Laterality Date  . Abdominal hysterectomy  1972  . Eye surgery  2007/2008    cataracts  . Cardiac surgery  2001    stent placement  . Hernia repair  2009  . Gallbladder surgery  2009  . Hip arthroplasty  06/06/2012    Procedure: ARTHROPLASTY BIPOLAR HIP;  Surgeon: Eldred MangesMark C Yates, MD;  Location: [facility];  Service: Orthopedics;  Laterality: Right;  monopolar   Social History:   reports that she has never smoked. She has never used smokeless tobacco. She reports that she does not drink alcohol or use illicit drugs.  Family History  Problem Relation Age of Onset  . Arthritis Father   . Stroke Father   . Hypertension Father   . Heart disease Sister   . Heart disease Brother   . Hypertension Brother   . Stroke Brother     Medications: Patient's Medications  New Prescriptions   No medications on file  Previous Medications   ACETAMINOPHEN (TYLENOL) 500 MG TABLET    Take 500 mg by mouth at bedtime. pain   ASPIRIN 81 MG CHEWABLE TABLET  Chew 81 mg by mouth daily.   BISACODYL (DULCOLAX) 10 MG SUPPOSITORY    Place 10 mg rectally as needed for moderate constipation (for constipation not relieved by milk of magnesium).   CLOPIDOGREL (PLAVIX) 75 MG TABLET    Take 75 mg by mouth daily.    DORZOLAMIDE HCL-TIMOLOL MAL PF 22.3-6.8 MG/ML SOLN    Place 1 drop into both eyes every 12 (twelve) hours.    FISH OIL-OMEGA-3 FATTY ACIDS 1000 MG CAPSULE    Take 1 g by mouth daily.   FUROSEMIDE (LASIX) 20 MG TABLET    Take 20 mg by mouth daily.   LATANOPROST (XALATAN) 0.005 % OPHTHALMIC SOLUTION    Place 1 drop  into both eyes at bedtime.   MAGNESIUM HYDROXIDE (MILK OF MAGNESIA) 400 MG/5ML SUSPENSION    Take 30 mLs by mouth daily as needed for mild constipation.   METOPROLOL TARTRATE (LOPRESSOR) 25 MG TABLET    Take 25 mg by mouth 2 (two) times daily.   MULTIPLE VITAMINS-MINERALS (ICAPS) TABS    Take 1 tablet by mouth daily.   OXYCODONE (ROXICODONE) 5 MG IMMEDIATE RELEASE TABLET    Take 1 tablet (5 mg total) by mouth every 4 (four) hours as needed for severe pain.   PANTOPRAZOLE (PROTONIX) 40 MG TABLET    Take 1 tablet (40 mg total) by mouth daily.   PROMETHAZINE (PHENERGAN) 25 MG TABLET    Take 25 mg by mouth every 6 (six) hours as needed for nausea or vomiting.   SODIUM PHOSPHATES (RA SALINE ENEMA) 19-7 GM/118ML ENEM    Place 1 enema rectally daily as needed (constipation not relieved by milk of magnesium, or bisacodyl suppository).   TRAMADOL (ULTRAM) 50 MG TABLET    Take 1 tablet (50 mg total) by mouth every 6 (six) hours as needed.  Modified Medications   No medications on file  Discontinued Medications   No medications on file     Physical Exam:                        Physical Exam    temperature is 97.8 pulse 67 respirations 18 blood pressures recently variable 110- 59-1 33/60-180/76 although this does not appear to be the usual Constitutional: She is oriented to person, place, and time. She appears well-developed and well-nourished. No distress. Wine comfortably in bed  HENT:  Mouth/Throat: Oropharynx is clear and moist. No oropharyngeal exudate.  Neck: Normal range of motion. Neck supple.  I could not appreciate any adenopathy Cardiovascular: Normal rate, regular rhythm, normal heart sounds and intact distal pulses.-- she has some mild right foot edema which is not new   Pulmonary/Chest: Effort normal -- she does have some rhonchi on expiration  Abdominal: Soft. Bowel sounds are normal. She exhibits no distension. There is no tenderness.  Musculoskeletal: Normal range of motion.   Neurological: She is alert and oriented to person, place, and time.  Skin: Skin is warm and dry. -- there is some dried pale erythematous scaling the right foot the wound to be healing quite unremarkably Psychiatric: She has a normal mood and affect.    Labs reviewed: Basic Metabolic Panel:  16/05/9603.  Sodium 139 potassium 4 BUN 36 creatinine 1.5  Recent Labs  04/11/14 0340 04/13/14 0523 04/14/14 0453  NA 141 137 137  K 3.8 4.1 4.0  CL 101 99 98  CO2 GLUCOSE 108* 90 97  BUN CREATININE 1.09  1.28* 1.39*  CALCIUM 9.2 9.1 9.4  MG 1.8  --   --   PHOS 3.1  --   --    Liver Function Tests:  Recent Labs  04/11/14 0340 04/13/14 0523  AST 19 19  ALT 12 10  ALKPHOS 78 72  BILITOT 0.5 0.3  PROT 7.1 6.9  ALBUMIN 3.0* 2.7*   No results for input(s): LIPASE, AMYLASE in the last 8760 hours. No results for input(s): AMMONIA in the last 8760 hours. CBC:   07/25/2014.   The WBC 8.8 hemoglobin 11.2 platelets 274  Recent Labs  04/10/14 2150 04/11/14 0340 04/13/14 0523 04/14/14 0453  WBC 10.3 9.4 8.7 9.6  NEUTROABS 6.2 5.5  --   --   HGB 11.5* 10.5* 10.1* 10.3*  HCT 34.8* 32.4* 31.7* 32.6*  MCV 95.9 95.9 99.4 99.4  PLT 243 243 215 255   TSH:  Recent Labs  10/26/13 1842 04/11/14 0340  TSH 6.149* 4.220   A1C: No results found for: HGBA1C Lipid Panel:  Recent Labs  10/27/13 0531  CHOL 199  HDL 38*  LDLCALC 120*  TRIG 204*  CHOLHDL 5.2      Assessment/Plan 1. Renal insufficiency - Her Lasix was recently decreased to 20 mg a day secondary to renal issues she will need an updated BMP her creatinine most recently 1.5  slightly above her baseline  2. Anemia, unspecified anemia type Will follow up cbc  3. Coronary artery disease involving native coronary artery of native heart without angina pectoris -no reports of chest pains or shortness of breath -conts on plavix and ASA -follow up cbc  4. Right foot wound Improved, conts to  be followed by treatment nurse  5. Essential hypertension, benign -blood pressure controlled on current medications, conts lopressor-- variable systolics but  I do not see consistent elevations-- this will have to be monitored   6. Osteoarthritis, unspecified osteoarthritis type, unspecified site -no complaints of pain at this time, conts PRNs  7. Glaucoma conts on scheduled drops  8. Edema -no edema noted at this time, conts on lasix  -will follow up bmpas noted above   #9-history of cough-possible chest congestion- will order a chest x-ray - also will start Mucinex 600 mg twice a day for 7 day secondary 2 nonproductive cough-also will add Flonase 1 spray each nostril twice a day for 7 days as well as.  Her sore throat appears to be improved per patient.   [id number] note greater than 40 minutes spent assessing patient-discussing her status with nursing staff-reviewing her chart- and coordinating and formulating a plan of care for numerous diagnoses-of note greater than 50% of time spent coordinating plan of care

## 2014-09-22 LAB — HEPATIC FUNCTION PANEL
ALT: 9 U/L (ref 7–35)
AST: 19 U/L (ref 13–35)
Alkaline Phosphatase: 70 U/L (ref 25–125)
Bilirubin, Total: 0.5 mg/dL

## 2014-09-22 LAB — CBC AND DIFFERENTIAL
HCT: 31 % — AB (ref 36–46)
HEMOGLOBIN: 10.6 g/dL — AB (ref 12.0–16.0)
PLATELETS: 250 10*3/uL (ref 150–399)
WBC: 11.2 10*3/mL

## 2014-09-22 LAB — BASIC METABOLIC PANEL
BUN: 30 mg/dL — AB (ref 4–21)
Creatinine: 1.4 mg/dL — AB (ref 0.5–1.1)
Glucose: 125 mg/dL
POTASSIUM: 3.7 mmol/L (ref 3.4–5.3)
Sodium: 138 mmol/L (ref 137–147)

## 2014-09-24 DIAGNOSIS — R05 Cough: Secondary | ICD-10-CM | POA: Insufficient documentation

## 2014-09-24 DIAGNOSIS — R059 Cough, unspecified: Secondary | ICD-10-CM | POA: Insufficient documentation

## 2014-10-09 IMAGING — CR DG PORTABLE PELVIS
1 series · 1 of 1 positions shown · non-contrast
Comparison: None.

CLINICAL DATA: Right hip fracture.

PORTABLE PELVIS

[AP]
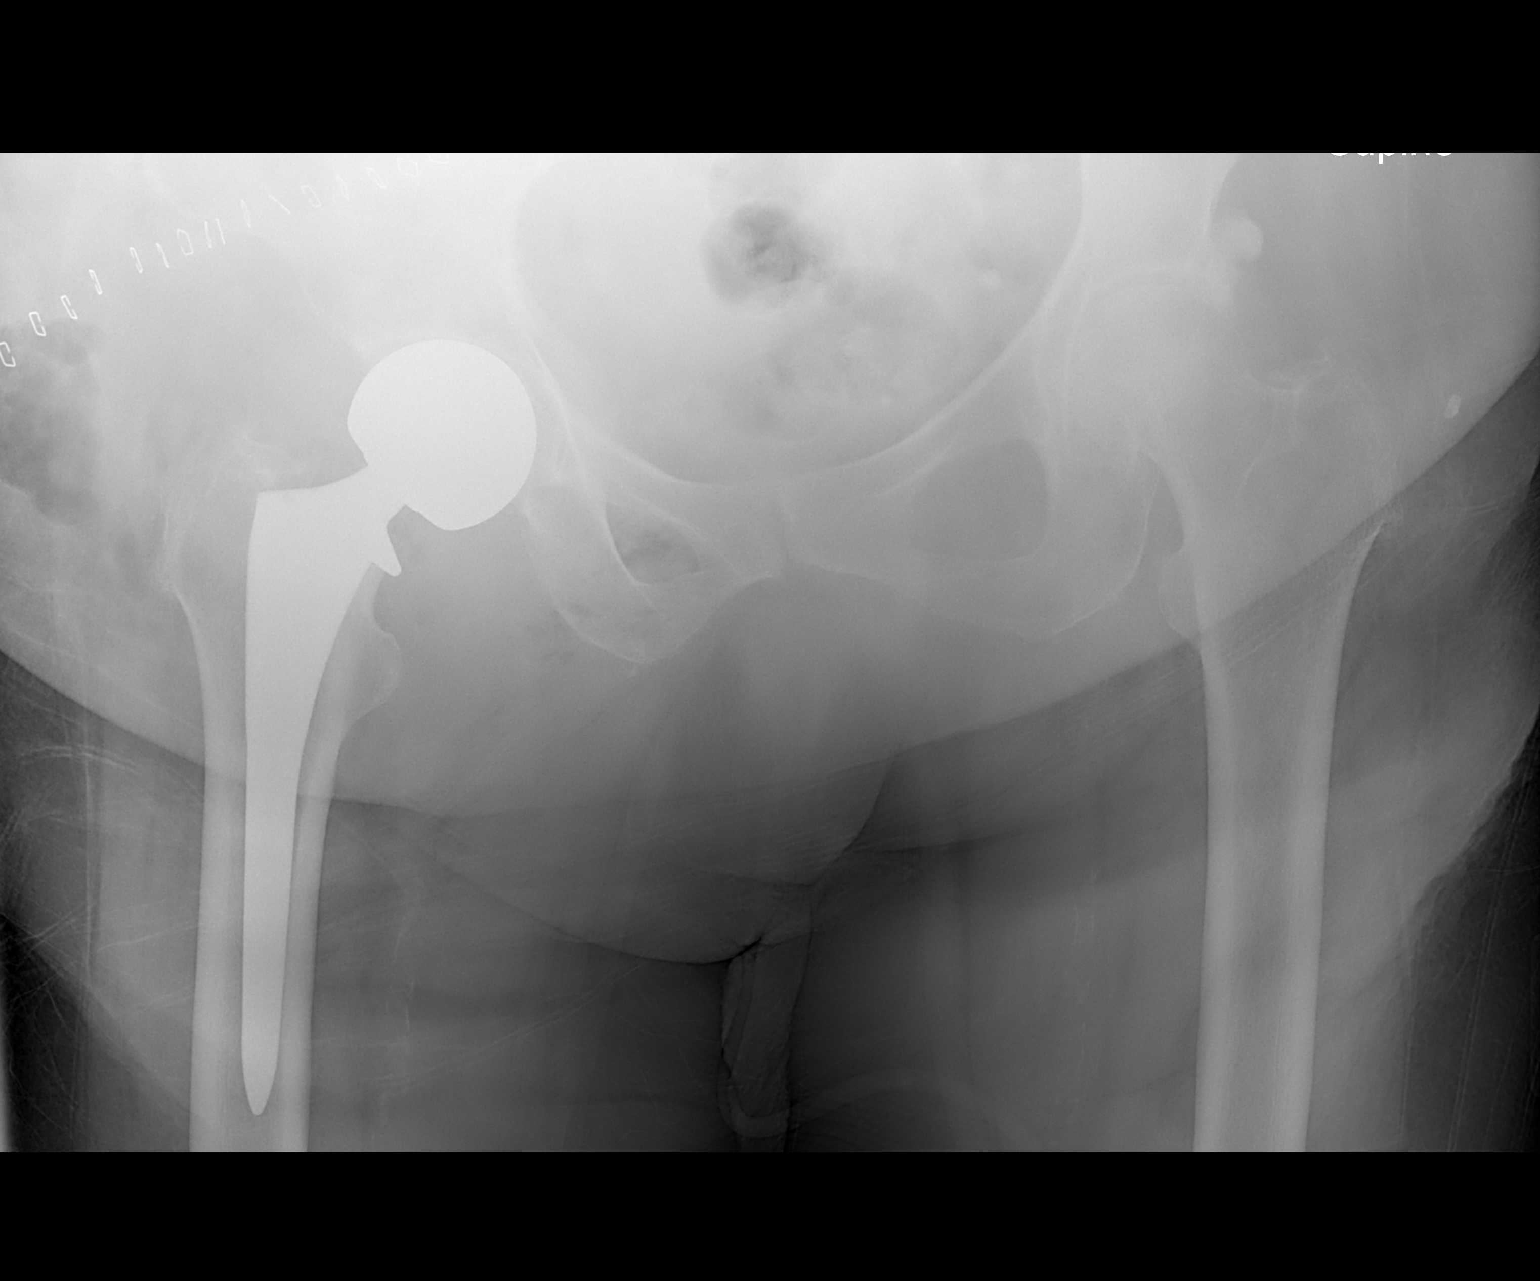

[1 of 1 positions shown; findings below may reference images not displayed]

FINDINGS: A right hip prosthesis is seen in expected position.  No
evidence of fracture or dislocation.  Overlying skin staples are
noted.
IMPRESSION: Expected postoperative appearance of right hip prosthesis.

## 2014-11-29 IMAGING — CT CT HIP*R* W/O CM
3 series · 17 of 46 positions shown, 20 images · non-contrast
Comparison: Radiographs dated 07/26/2012 and CT scan dated
06/05/2012

CLINICAL DATA: Right hip pain since a fall yesterday.

CT OF THE RIGHT HIP WITHOUT CONTRAST
TECHNIQUE: Multidetector CT imaging was performed according to the
standard protocol. Multiplanar CT image reconstructions were also
generated.

[Series 4: hip st · axial · 0.41mm/px · z∈[-486,-216]mm · 13 of 60 slices shown, 16 images]
[im 4/60  soft-tissue]
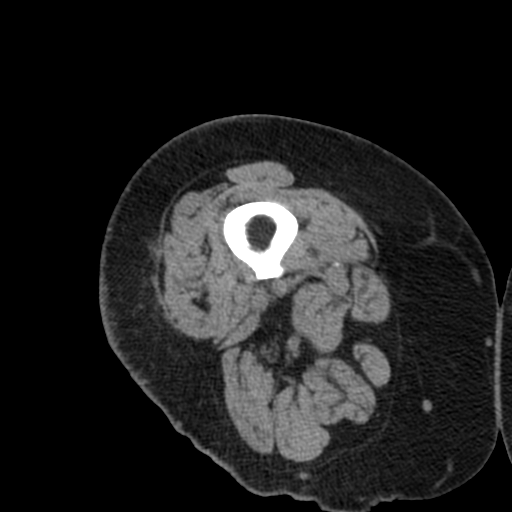
[im 4/60  bone]
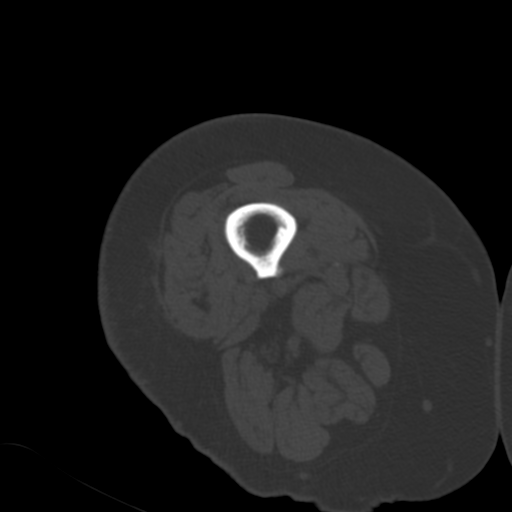
[im 10/60  soft-tissue]
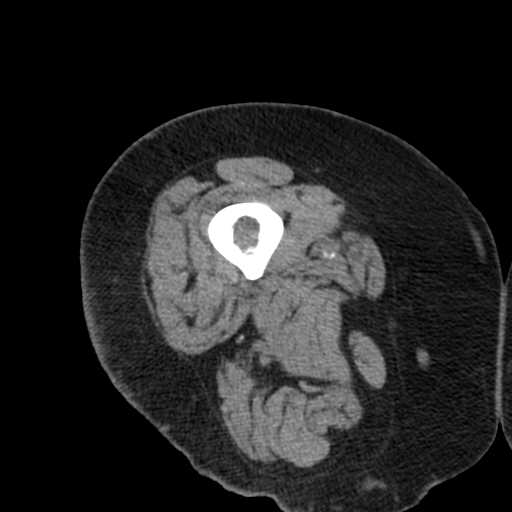
[im 16/60  soft-tissue]
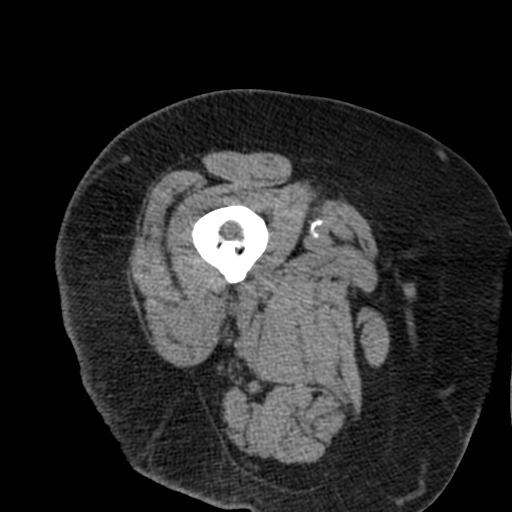
[im 21/60  soft-tissue]
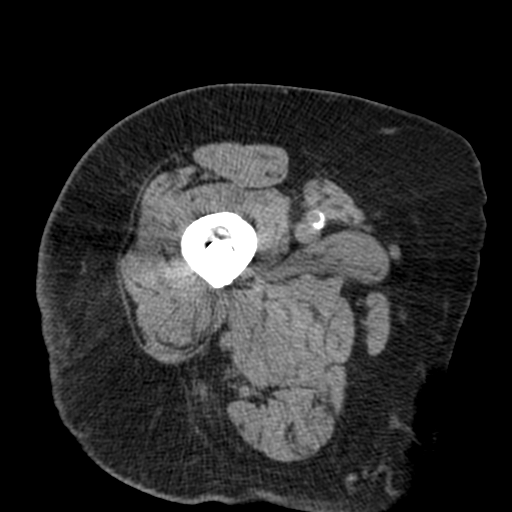
[im 27/60  soft-tissue]
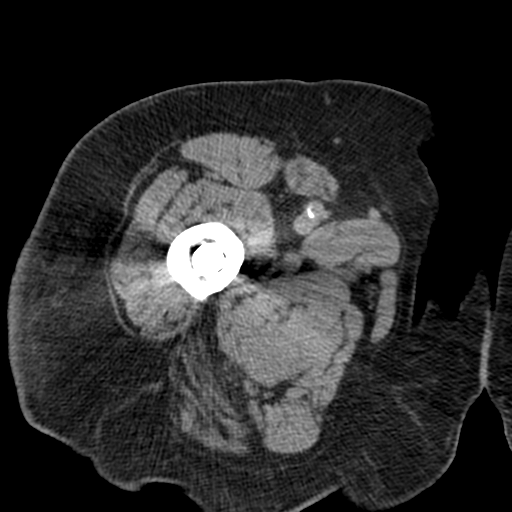
[im 33/60  soft-tissue]
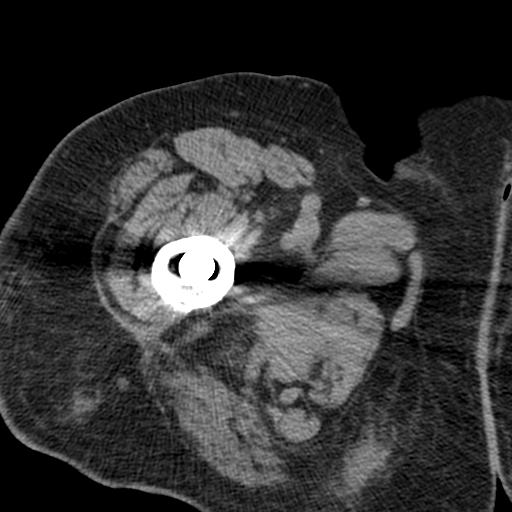
[im 39/60  soft-tissue]
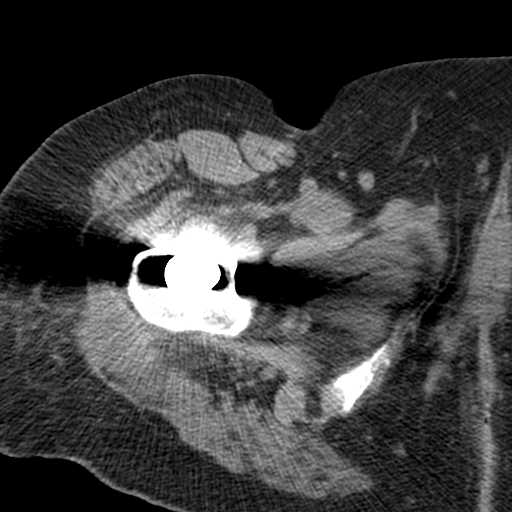
[im 44/60  soft-tissue]
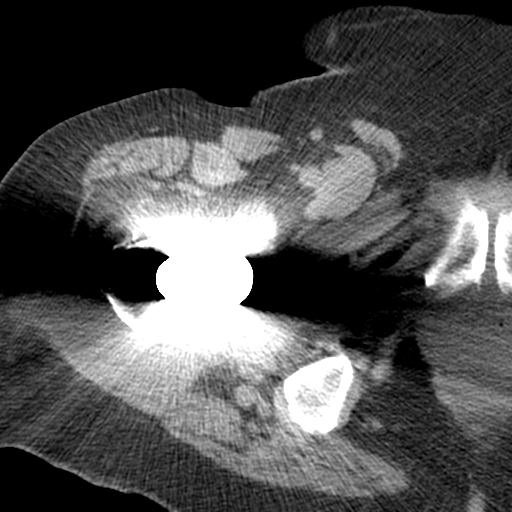
[im 50/60  soft-tissue]
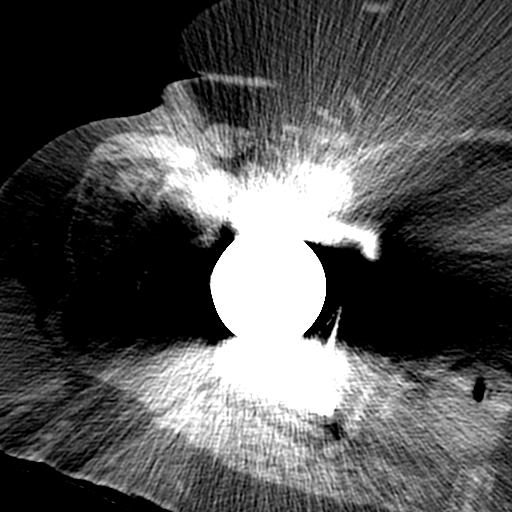
[im 50/60  bone]
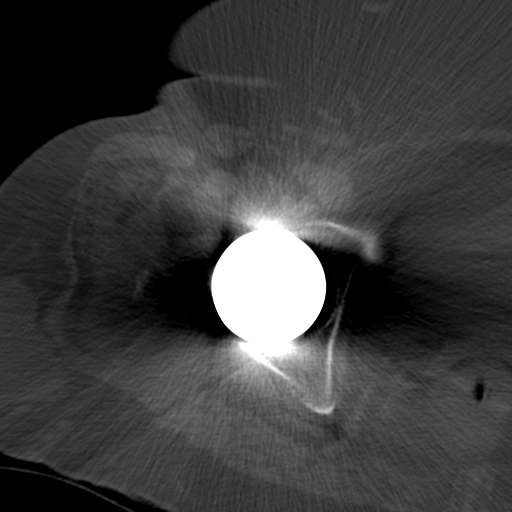
[im 52/60  lung]
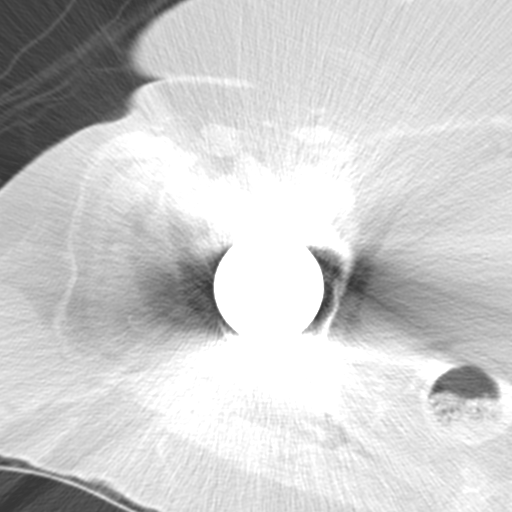
[im 54/60  lung]
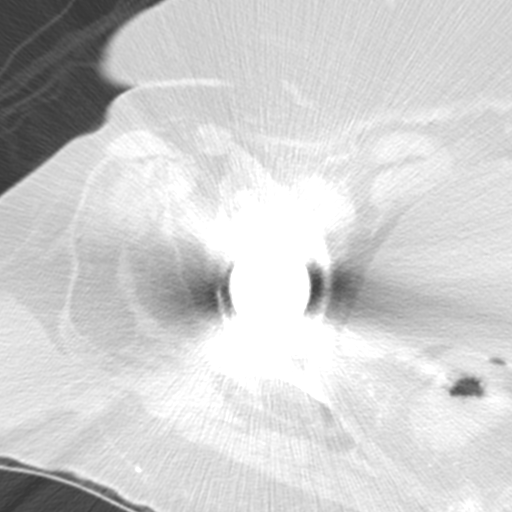
[im 56/60  soft-tissue]
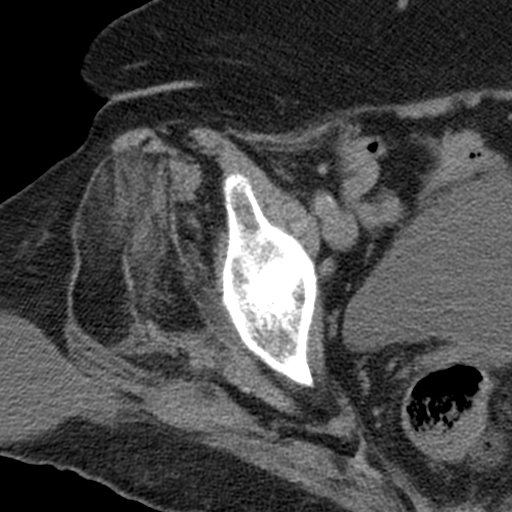
[im 56/60  lung]
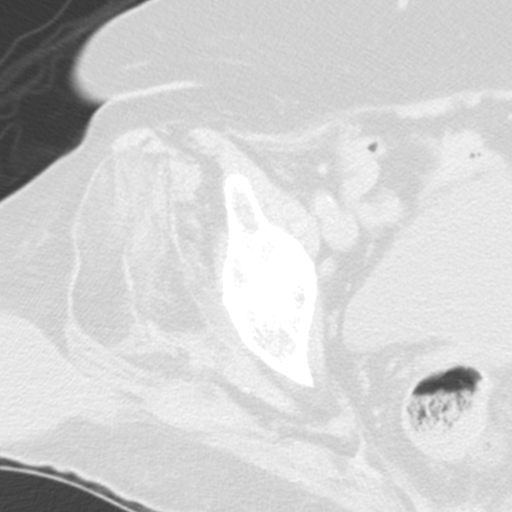
[im 58/60  lung]
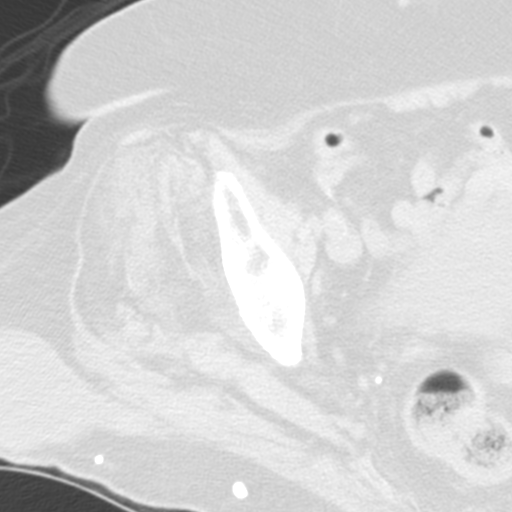

[Series 602: <mpr thick range> · coronal · 0.58mm/px · 3 of 56 slices shown]
[im 19/56  soft-tissue]
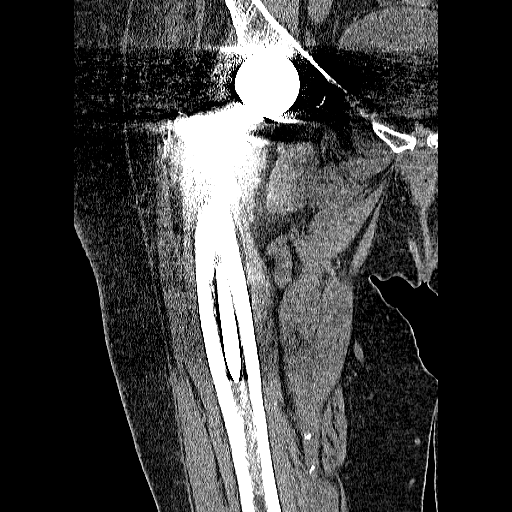
[im 25/56  soft-tissue]
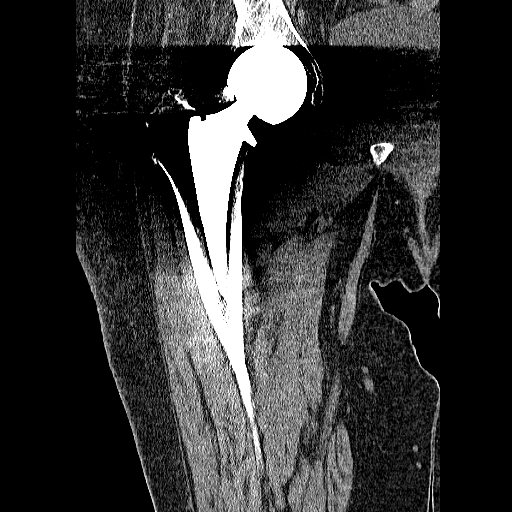
[im 31/56  soft-tissue]
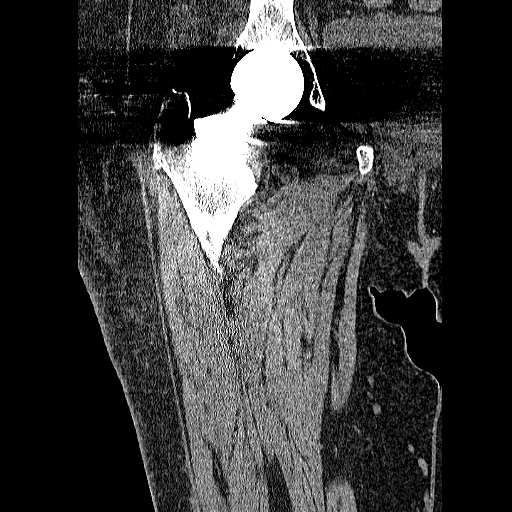

[Series 604: <mpr thick range(2)> · sagittal · 0.58mm/px · 1 of 94 slices shown]
[im 32/94  soft-tissue]
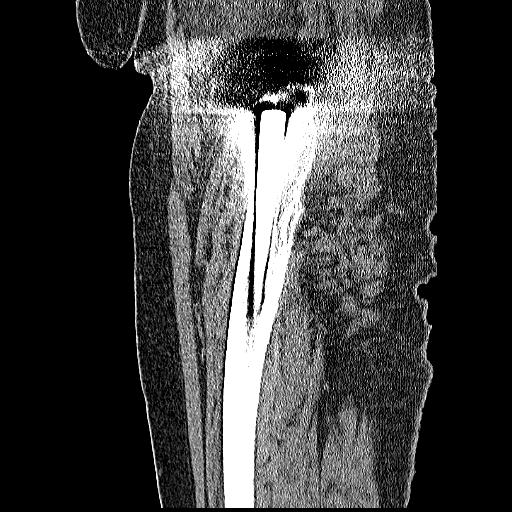

[17 of 46 positions shown; findings below may reference images not displayed]

FINDINGS: There is a large hematoma in the subcutaneous fat of the
right buttock measuring approximately 8.5 x 4.3 x 7.5 cm.  The
hematoma is incompletely visualized.

There is no fracture or dislocation of the right hip.  Prosthesis
appears in good position with no evidence of loosening.
IMPRESSION: 1.  No fracture of the right hip.
2.  Large buttock hematoma.

## 2014-12-04 ENCOUNTER — Non-Acute Institutional Stay (SKILLED_NURSING_FACILITY): Payer: Medicare Other | Admitting: Internal Medicine

## 2014-12-04 ENCOUNTER — Encounter: Payer: Self-pay | Admitting: Internal Medicine

## 2014-12-04 DIAGNOSIS — D638 Anemia in other chronic diseases classified elsewhere: Secondary | ICD-10-CM

## 2014-12-04 DIAGNOSIS — R609 Edema, unspecified: Secondary | ICD-10-CM | POA: Diagnosis not present

## 2014-12-04 DIAGNOSIS — I25118 Atherosclerotic heart disease of native coronary artery with other forms of angina pectoris: Secondary | ICD-10-CM

## 2014-12-04 DIAGNOSIS — N289 Disorder of kidney and ureter, unspecified: Secondary | ICD-10-CM | POA: Diagnosis not present

## 2014-12-04 NOTE — Progress Notes (Signed)
Patient ID: [name] [name], female   DOB: 1919-03-24, 81 y.o.   MRN: [id number]       this is a routine visit.  Level care skilled.  Facility [facility].   Chief Complaint  Patient presents with  . Medical Management of Chronic Issues-- including coronary artery disease-edema-anemia-renal insufficiency-hypertension     HPI:  Patient is a 81 y.o. female seen today at [facility] for routine follow up on chronic conditions -- Pt with pmh of CAD, LE edema, HTN, Renal Insufficiency, OA, glaucoma. Pt has been in her usual state of health over the last month. She has a history of coronary artery disease and actually has goneto the ER months ago for chest pain but cardiac enzymes were negative she was evaluated by cardiology and some back to the facility this has been stable for some time--she continues on Plavix and aspirin.  She is also on fish oil  She has some chronic lower extremity edema she is on Lasix 20 mg a day had been on a higher dose but there were concerns about her renal insufficiency-she does not complain of any cough or shortness of breath.  She also has a history of glaucoma she is followed by ophthalmology she is on topical eyedrops for that-she continues to complain at times of some blurred vision which is not new  .   Marland Kitchen.  Review of Systems:  Review of Systems  Constitutional: Negative for activity change, appetite change, fatigue and unexpected weight change.  HENT: Negative for congestion.   Eyes: Positive for visual disturbance (hx of glaucoma).  Respiratory: Refer cough--negative shortness of breath.   Cardiovascular: Negative for chest pain, palpitations --she does have some leg edema.  Gastrointestinal: Negative for abdominal pain, diarrhea and constipation.  Genitourinary: Negative for dysuria and difficulty urinating.  Musculoskeletal: Negative for myalgias and arthralgias.  Skin: History of wound to right foot although this appears  to be largely resolved. Negative for color change.  Neurological: Negative for dizziness and weakness.  Psychiatric/Behavioral: Negative for behavioral problems, confusion and agitation.    Past Medical History  Diagnosis Date  . Hypertension   . Cataract     both eyes  . Osteoarthritis   . Glaucoma   . Poor circulation   . CAD (coronary artery disease)     a. 02/2001 PTCA & BMS to the OM1;  b. 08/2001 CBA to ISR in OM1, CBA of mid LAD;  c. 02/2002 Failed PCI of mLAD occlusion, LCX nl, patent OM1 stent, RCA dom, 40p, PDA/PL nl, EF 50% mild antlat HK.  Marland Kitchen. Thyroid disease     hypothyroidism, hypothyroidism  . Hypertriglyceridemia    Past Surgical History  Procedure Laterality Date  . Abdominal hysterectomy  1972  . Eye surgery  2007/2008    cataracts  . Cardiac surgery  2001    stent placement  . Hernia repair  2009  . Gallbladder surgery  2009  . Hip arthroplasty  06/06/2012    Procedure: ARTHROPLASTY BIPOLAR HIP;  Surgeon: Eldred MangesMark C Yates, MD;  Location: [facility];  Service: Orthopedics;  Laterality: Right;  monopolar   Social History:   reports that she has never smoked. She has never used smokeless tobacco. She reports that she does not drink alcohol or use illicit drugs.  Family History  Problem Relation Age of Onset  . Arthritis Father   . Stroke Father   . Hypertension Father   . Heart disease Sister   .  Heart disease Brother   . Hypertension Brother   . Stroke Brother     Medications: Patient's Medications  New Prescriptions   No medications on file  Previous Medications   ACETAMINOPHEN (TYLENOL) 500 MG TABLET    Take 500 mg by mouth at bedtime. pain   ASPIRIN 81 MG CHEWABLE TABLET    Chew 81 mg by mouth daily.   BISACODYL (DULCOLAX) 10 MG SUPPOSITORY    Place 10 mg rectally as needed for moderate constipation (for constipation not relieved by milk of magnesium).   CLOPIDOGREL (PLAVIX) 75 MG TABLET    Take 75 mg by mouth daily.    DORZOLAMIDE HCL-TIMOLOL MAL PF  22.3-6.8 MG/ML SOLN    Place 1 drop into both eyes every 12 (twelve) hours.    FISH OIL-OMEGA-3 FATTY ACIDS 1000 MG CAPSULE    Take 1 g by mouth daily.   FUROSEMIDE (LASIX) 20 MG TABLET    Take 20 mg by mouth daily.   LATANOPROST (XALATAN) 0.005 % OPHTHALMIC SOLUTION    Place 1 drop into both eyes at bedtime.   MAGNESIUM HYDROXIDE (MILK OF MAGNESIA) 400 MG/5ML SUSPENSION    Take 30 mLs by mouth daily as needed for mild constipation.   METOPROLOL TARTRATE (LOPRESSOR) 25 MG TABLET    Take 25 mg by mouth 2 (two) times daily.   MULTIPLE VITAMINS-MINERALS (ICAPS) TABS    Take 1 tablet by mouth daily.   OXYCODONE (ROXICODONE) 5 MG IMMEDIATE RELEASE TABLET    Take 1 tablet (5 mg total) by mouth every 4 (four) hours as needed for severe pain.   PANTOPRAZOLE (PROTONIX) 40 MG TABLET    Take 1 tablet (40 mg total) by mouth daily.   PROMETHAZINE (PHENERGAN) 25 MG TABLET    Take 25 mg by mouth every 6 (six) hours as needed for nausea or vomiting.   SODIUM PHOSPHATES (RA SALINE ENEMA) 19-7 GM/118ML ENEM    Place 1 enema rectally daily as needed (constipation not relieved by milk of magnesium, or bisacodyl suppository).   TRAMADOL (ULTRAM) 50 MG TABLET    Take 1 tablet (50 mg total) by mouth every 6 (six) hours as needed.  Modified Medications   No medications on file  Discontinued Medications   No medications on file     Physical Exam:                        Physical Exam    Temperature 98.7 pulse 62 respirations 18 blood pressure 125/63-100/52 in this range recently Constitutional: She is oriented to person, place, and time. She appears well-developed and well-nourished. No distress. Eating comfortably in her chair watching television  HENT:  Mouth/Throat: Oropharynx is clear and moist. No oropharyngeal exudate Visual daily she reports at times some blurred physician but grossly appears to be intact extraocular movements intact pupils reactive to light.  Neck: Normal range of motion. Neck  supple.  I could not appreciate any adenopathy Cardiovascular: Normal rate, regular rhythm, normal heart sounds and reduced distal pulses.--I suspect this is secondary to the edema she has 1+ lower extremity edema bordering on 2+ of her feet-this is coll to touch nonerythematous   Pulmonary/Chest: Effort normal -- clear to auscultation there is no labored breathing  Abdominal: Soft. Bowel sounds are normal. She exhibits no distension. There is no tenderness.  Musculoskeletal: Normal range of motion.  Neurological: She is alert and oriented to person, place, and time.  Skin: Skin is warm and  dry. -- previous wound on right foot appears to be largely resolved y Psychiatric: She has a normal mood and affect.    Labs reviewed: Basic Metabolic Panel:  91/47/8295.  Sodium 138 potassium 3.7 BUN 30 creatinine 1.4.  Liver function tests within normal limits.  WBC 11.2 hemoglobin 10.6 platelets 250  08/21/2014.  Sodium 139 potassium 4 BUN 36 creatinine 1.5  Recent Labs  04/11/14 0340 04/13/14 0523 04/14/14 0453  NA 141 137 137  K 3.8 4.1 4.0  CL 101 99 98  CO2 GLUCOSE 108* 90 97  BUN CREATININE 1.09 1.28* 1.39*  CALCIUM 9.2 9.1 9.4  MG 1.8  --   --   PHOS 3.1  --   --    Liver Function Tests:  Recent Labs  04/11/14 0340 04/13/14 0523  AST 19 19  ALT 12 10  ALKPHOS 78 72  BILITOT 0.5 0.3  PROT 7.1 6.9  ALBUMIN 3.0* 2.7*   No results for input(s): LIPASE, AMYLASE in the last 8760 hours. No results for input(s): AMMONIA in the last 8760 hours. CBC:   07/25/2014.   The WBC 8.8 hemoglobin 11.2 platelets 274  Recent Labs  04/10/14 2150 04/11/14 0340 04/13/14 0523 04/14/14 0453  WBC 10.3 9.4 8.7 9.6  NEUTROABS 6.2 5.5  --   --   HGB 11.5* 10.5* 10.1* 10.3*  HCT 34.8* 32.4* 31.7* 32.6*  MCV 95.9 95.9 99.4 99.4  PLT 243 243 215 255   TSH:  Recent Labs  10/26/13 1842 04/11/14 0340  TSH 6.149* 4.220   A1C: No results found for:  HGBA1C Lipid Panel:  Recent Labs  10/27/13 0531  CHOL 199  HDL 38*  LDLCALC 120*  TRIG 204*  CHOLHDL 5.2      Assessment/Plan 1. Renal insufficiency - Her Lasix was  decreased to 20 mg a day secondary to renal issues--will update a metabolic panel I do note what appears to be some increased edema would like to see where we stand renal function wise  2. Anemia, unspecified anemia type Will follow up cbc--most recent hemoglobin 10.6 on 09/22/2014 appears to be stable  3. Coronary artery disease involving native coronary artery of native heart without angina pectoris -no reports of chest pains or shortness of breath -conts on plavix and ASA -follow up cbc  4. Right foot wound This appears to be largely resolved-is followed by wound care  5. Essential hypertension, benign -blood pressure controlled on current medications, conts lopressor--    6. Osteoarthritis, unspecified osteoarthritis type, unspecified site -no complaints of pain at this time, conts PRNs  7. Glaucoma conts on scheduled drops--is followed by ophthalmology  8. Edema - This appears to be increased from previous exam although clinically she appears to be stable-will update a metabolic panel-her weights also will have to be monitored closely-- weight patient tomorrow notify provider of any weight gain also weight patient 2 times a week to keep an eye on this.  If renal function appears stable considering increasing her Lasix-again this is somewhat of a challenging balancing situation  In addition to metabolic panel also will obtain a BNP  Also per review of notes from March 2015 it appears her TSH was slightly elevated will obtain a TSH as well.  I also note back in 2013 her BNP was 1110 I suspect there is an element of chronicity to this  I do not see a cardiac echo in Epic--or a formal diagnosis of  CHF-from what I have been able to assess in her chart     [id number]-of note greater than 40 minutes  spent assessing patient--reviewing her chart- and coordinating and formulating a plan of care for numerous diagnoses-of note greater than 50% of time spent coordinating plan of care

## 2014-12-05 LAB — HEPATIC FUNCTION PANEL
ALT: 12 U/L (ref 7–35)
AST: 21 U/L (ref 13–35)
Alkaline Phosphatase: 83 U/L (ref 25–125)
Bilirubin, Total: 0.2 mg/dL

## 2014-12-05 LAB — BASIC METABOLIC PANEL
BUN: 33 mg/dL — AB (ref 4–21)
CREATININE: 1.6 mg/dL — AB (ref 0.5–1.1)
GLUCOSE: 89 mg/dL
Potassium: 4.8 mmol/L (ref 3.4–5.3)
Sodium: 145 mmol/L (ref 137–147)

## 2014-12-05 LAB — TSH: TSH: 11.13 u[IU]/mL — AB (ref 0.41–5.90)

## 2014-12-05 LAB — CBC AND DIFFERENTIAL
HEMATOCRIT: 35 % — AB (ref 36–46)
Hemoglobin: 10.9 g/dL — AB (ref 12.0–16.0)
Platelets: 249 10*3/uL (ref 150–399)
WBC: 8.6 10^3/mL

## 2015-01-01 ENCOUNTER — Non-Acute Institutional Stay (SKILLED_NURSING_FACILITY): Payer: Medicare Other | Admitting: Internal Medicine

## 2015-01-01 ENCOUNTER — Encounter: Payer: Self-pay | Admitting: Internal Medicine

## 2015-01-01 DIAGNOSIS — I10 Essential (primary) hypertension: Secondary | ICD-10-CM

## 2015-01-01 DIAGNOSIS — R609 Edema, unspecified: Secondary | ICD-10-CM | POA: Diagnosis not present

## 2015-01-01 DIAGNOSIS — M159 Polyosteoarthritis, unspecified: Secondary | ICD-10-CM

## 2015-01-01 DIAGNOSIS — N289 Disorder of kidney and ureter, unspecified: Secondary | ICD-10-CM

## 2015-01-01 DIAGNOSIS — M150 Primary generalized (osteo)arthritis: Secondary | ICD-10-CM | POA: Diagnosis not present

## 2015-01-01 NOTE — Progress Notes (Signed)
Patient ID: [name] [name], female   DOB: Jan 02, 1919, 81 y.o.   MRN: [id number]       this is a routine visit.  Level care skilled.  Facility [facility].   Chief Complaint  Patient presents with  . Medical Management of Chronic Issues-- including coronary artery disease-edema-anemia-renal insufficiency-hypertension     HPI:  Patient is a 81 y.o. female seen today at [facility] for routine follow up on chronic conditions -- Pt with pmh of CAD, LE edema, HTN, Renal Insufficiency, OA, glaucoma. Pt has been in her usual state of health over the last month. She has a history of coronary artery disease and actually has goneto the ER months ago for chest pain but cardiac enzymes were negative she was evaluated by cardiology and some back to the facility this has been stable for some time--she continues on Plavix and aspirin.  She is also on fish oil  She has some chronic lower extremity edema she is on Lasix 20 mg a day had been on a higher dose but there were concerns about her renal insufficiency-she does not complain of any cough or shortness of breath--creatinine last month was 1.57 BUN of 33 this is relatively baseline Will update this.  She also has a history of glaucoma she is followed by ophthalmology she is on topical eyedrops for that-she continues to complain at times of some blurred vision which is not new  .   Marland Kitchen.  Review of Systems:  Review of Systems  Constitutional: Negative for activity change, appetite change, fatigue and unexpected weight change.  HENT: Negative for congestion.   Eyes: Positive for visual disturbance (hx of glaucoma).  Respiratory: Refer cough--negative shortness of breath.   Cardiovascular: Negative for chest pain, palpitations --she does have some leg edema.  Gastrointestinal: Negative for abdominal pain, diarrhea and constipation.  Genitourinary: Negative for dysuria and difficulty urinating.  Musculoskeletal: Negative for  myalgias and arthralgias.  Skin: History of wound to right foot although this appears to be largely resolved. Negative for color change.  Neurological: Negative for dizziness and weakness.  Psychiatric/Behavioral: Negative for behavioral problems, confusion and agitation.    Past Medical History  Diagnosis Date  . Hypertension   . Cataract     both eyes  . Osteoarthritis   . Glaucoma   . Poor circulation   . CAD (coronary artery disease)     a. 02/2001 PTCA & BMS to the OM1;  b. 08/2001 CBA to ISR in OM1, CBA of mid LAD;  c. 02/2002 Failed PCI of mLAD occlusion, LCX nl, patent OM1 stent, RCA dom, 40p, PDA/PL nl, EF 50% mild antlat HK.  Marland Kitchen. Thyroid disease     hypothyroidism, hypothyroidism  . Hypertriglyceridemia    Past Surgical History  Procedure Laterality Date  . Abdominal hysterectomy  1972  . Eye surgery  2007/2008    cataracts  . Cardiac surgery  2001    stent placement  . Hernia repair  2009  . Gallbladder surgery  2009  . Hip arthroplasty  06/06/2012    Procedure: ARTHROPLASTY BIPOLAR HIP;  Surgeon: Eldred MangesMark C Yates, MD;  Location: [facility];  Service: Orthopedics;  Laterality: Right;  monopolar   Social History:   reports that she has never smoked. She has never used smokeless tobacco. She reports that she does not drink alcohol or use illicit drugs.  Family History  Problem Relation Age of Onset  . Arthritis Father   . Stroke Father   .  Hypertension Father   . Heart disease Sister   . Heart disease Brother   . Hypertension Brother   . Stroke Brother     Medications: Patient's Medications  New Prescriptions   No medications on file  Previous Medications   ACETAMINOPHEN (TYLENOL) 500 MG TABLET    Take 500 mg by mouth at bedtime. pain   ASPIRIN 81 MG CHEWABLE TABLET    Chew 81 mg by mouth daily.   BISACODYL (DULCOLAX) 10 MG SUPPOSITORY    Place 10 mg rectally as needed for moderate constipation (for constipation not relieved by milk of magnesium).   CLOPIDOGREL  (PLAVIX) 75 MG TABLET    Take 75 mg by mouth daily.    DORZOLAMIDE HCL-TIMOLOL MAL PF 22.3-6.8 MG/ML SOLN    Place 1 drop into both eyes every 12 (twelve) hours.    FISH OIL-OMEGA-3 FATTY ACIDS 1000 MG CAPSULE    Take 1 g by mouth daily.   FUROSEMIDE (LASIX) 20 MG TABLET    Take 20 mg by mouth daily.   LATANOPROST (XALATAN) 0.005 % OPHTHALMIC SOLUTION    Place 1 drop into both eyes at bedtime.   MAGNESIUM HYDROXIDE (MILK OF MAGNESIA) 400 MG/5ML SUSPENSION    Take 30 mLs by mouth daily as needed for mild constipation.   METOPROLOL TARTRATE (LOPRESSOR) 25 MG TABLET    Take 25 mg by mouth 2 (two) times daily.   MULTIPLE VITAMINS-MINERALS (ICAPS) TABS    Take 1 tablet by mouth daily.   OXYCODONE (ROXICODONE) 5 MG IMMEDIATE RELEASE TABLET    Take 1 tablet (5 mg total) by mouth every 4 (four) hours as needed for severe pain.   PANTOPRAZOLE (PROTONIX) 40 MG TABLET    Take 1 tablet (40 mg total) by mouth daily.   PROMETHAZINE (PHENERGAN) 25 MG TABLET    Take 25 mg by mouth every 6 (six) hours as needed for nausea or vomiting.   SODIUM PHOSPHATES (RA SALINE ENEMA) 19-7 GM/118ML ENEM    Place 1 enema rectally daily as needed (constipation not relieved by milk of magnesium, or bisacodyl suppository).   TRAMADOL (ULTRAM) 50 MG TABLET    Take 1 tablet (50 mg total) by mouth every 6 (six) hours as needed.  Modified Medications   No medications on file  Discontinued Medications   No medications on file     Physical Exam:                        Physical Exam   She is afebrile pulse of 72 respirations   18 blood pressure 121/50-119/56 most recently  Constitutional: She is oriented to person, place, and time. She appears well-developed and well-nourished. No distress  HENT:  Mouth/Throat: Oropharynx is clear and moist. No oropharyngeal exudate  Eyes visual acuity appears grossly intact sclera and conjunctiva are clear Extraocular movements intact-pupils appear reactive  Neck: Normal range of  motion. Neck supple.  I could not appreciate any adenopathy Cardiovascular: Normal rate, regular rhythm, normal heart sounds and reduced distal pulses.--I suspect this is secondary to the edema she has 1+ lower extremity edema a bit more pronounced in her feet this appears to be fairly baseline-this is cool to touch nonerythematous   Pulmonary/Chest: Effort normal -- clear to auscultation there is no labored breathing  Abdominal: Soft. Bowel sounds are normal. She exhibits no distension. There is no tenderness.  Musculoskeletal: Normal range of motion.  did not note any deformities she is ambulatory in  a wheelchair  Neurological: She is alert and oriented to person, place, and time.  Skin: Skin is warm and dry. --  Psychiatric: She has a normal mood and affect.    Labs reviewed:  12/05/2014.  Sodium 145 potassium 4.8 BUN 33 creatinine 1.57.--Liver function tests within normal limits  WBC 8.6 hemoglobin 10.9 platelets 249  BNP-100  TSH-11.133 Basic Metabolic Panel:  40/98/119101/29/2016.  Sodium 138 potassium 3.7 BUN 30 creatinine 1.4.  Liver function tests within normal limits.  WBC 11.2 hemoglobin 10.6 platelets 250  08/21/2014.  Sodium 139 potassium 4 BUN 36 creatinine 1.5  Recent Labs  04/11/14 0340 04/13/14 0523 04/14/14 0453  NA 141 137 137  K 3.8 4.1 4.0  CL 101 99 98  CO2 28 28 29   GLUCOSE 108* 90 97  BUN 19 20 21   CREATININE 1.09 1.28* 1.39*  CALCIUM 9.2 9.1 9.4  MG 1.8  --   --   PHOS 3.1  --   --    Liver Function Tests:  Recent Labs  04/11/14 0340 04/13/14 0523  AST 19 19  ALT 12 10  ALKPHOS 78 72  BILITOT 0.5 0.3  PROT 7.1 6.9  ALBUMIN 3.0* 2.7*   No results for input(s): LIPASE, AMYLASE in the last 8760 hours. No results for input(s): AMMONIA in the last 8760 hours. CBC:   07/25/2014.   The WBC 8.8 hemoglobin 11.2 platelets 274  Recent Labs  04/10/14 2150 04/11/14 0340 04/13/14 0523 04/14/14 0453  WBC 10.3 9.4 8.7 9.6  NEUTROABS 6.2 5.5   --   --   HGB 11.5* 10.5* 10.1* 10.3*  HCT 34.8* 32.4* 31.7* 32.6*  MCV 95.9 95.9 99.4 99.4  PLT 243 243 215 255   TSH:  Recent Labs  10/26/13 1842 04/11/14 0340  TSH 6.149* 4.220   A1C: No results found for: HGBA1C Lipid Panel:  Recent Labs  10/27/13 0531  CHOL 199  HDL 38*  LDLCALC 120*  TRIG 204*  CHOLHDL 5.2      Assessment/Plan 1. Renal insufficiency - Her Lasix was  decreased to 20 mg a day secondary to renal issues--will update a metabolic panel I  Edema appears relatively baseline   2. Anemia, unspecified anemia type This appears stable most recently 10.9   on04/07/2015   3. Coronary artery disease involving native coronary artery of native heart without angina pectoris -no reports of chest pains or shortness of breath -conts on plavix and ASA  4. Right foot wound This appears to be largely resolved  5. Essential hypertension, benign -blood pressure controlled on current medications, -- lopressor--    6. Osteoarthritis, unspecified osteoarthritis type, unspecified site -no complaints of pain at this time, conts PRNs  7. Glaucoma conts on scheduled drops--is followed by ophthalmology  8. Edema -  As noted above this appears to be stable--I note a BNP done last month was unremarkable at 100  .  I also note back in 2013 her BNP was 1110 I suspect there is an element of chronicity to this  I do not see a cardiac echo in Epic--or a formal diagnosis of CHF-from what I have been able to assess in her chart  #9-history of elevated TSH of 11.133 on lab done last month-she has been started on Synthroid update TSH is pending   [id number]

## 2015-01-23 LAB — TSH: TSH: 4.66 u[IU]/mL (ref 0.41–5.90)

## 2015-02-19 ENCOUNTER — Non-Acute Institutional Stay (SKILLED_NURSING_FACILITY): Payer: Medicare Other | Admitting: Internal Medicine

## 2015-02-19 ENCOUNTER — Encounter: Payer: Self-pay | Admitting: Internal Medicine

## 2015-02-19 DIAGNOSIS — I10 Essential (primary) hypertension: Secondary | ICD-10-CM

## 2015-02-19 DIAGNOSIS — H409 Unspecified glaucoma: Secondary | ICD-10-CM

## 2015-02-19 DIAGNOSIS — E038 Other specified hypothyroidism: Secondary | ICD-10-CM

## 2015-02-19 DIAGNOSIS — R609 Edema, unspecified: Secondary | ICD-10-CM | POA: Diagnosis not present

## 2015-02-19 DIAGNOSIS — N289 Disorder of kidney and ureter, unspecified: Secondary | ICD-10-CM

## 2015-02-19 DIAGNOSIS — D649 Anemia, unspecified: Secondary | ICD-10-CM | POA: Diagnosis not present

## 2015-02-19 DIAGNOSIS — I2510 Atherosclerotic heart disease of native coronary artery without angina pectoris: Secondary | ICD-10-CM

## 2015-02-19 NOTE — Progress Notes (Signed)
Patient ID: [name] [name], female   DOB: December 05, 1918, 81 y.o.   MRN: [id number]        this is a routine visit.  Level care skilled.  Facility [facility].   Chief Complaint  Patient presents with  . Medical Management of Chronic Issues-- including coronary artery disease-edema-anemia-renal insufficiency-hypertension     HPI:  Patient is a 81 y.o. female seen today at [facility] for routine follow up on chronic conditions -- Pt with pmh of CAD, LE edema, HTN, Renal Insufficiency, OA, glaucoma. Pt has been in her usual state of health over the last month. She has a history of coronary artery disease and actually has goneto the ER months ago for chest pain but cardiac enzymes were negative she was evaluated by cardiology and some back to the facility this has been stable for some time--she continues on Plavix and aspirin.  She is also on fish oil  She has some chronic lower extremity edema she is on Lasix 20 mg a day had been on a higher dose but there were concerns about her renal insufficiency-she does not complain of any cough or shortness of breath--creatinine in April was 1.57 with a BUN of 33 this appears to be relatively baseline Will update this  She also has a history of glaucoma she is followed by ophthalmology she is on topical eyedrops for that-she continues to complain at times of some blurred vision which is not new  Currently she has no complaints sitting comfortably in her chair watching TV   .   Marland Kitchen  Review of Systems:  Review of Systems  Constitutional: Negative for activity change, appetite change, fatigue and unexpected weight change.  HENT: Negative for congestion.   Eyes: Positive for visual disturbance (hx of glaucoma).  Respiratory: Refer cough--negative shortness of breath.   Cardiovascular: Negative for chest pain, palpitations --she does have some leg edema.  Gastrointestinal: Negative for abdominal pain, diarrhea and constipation.   Genitourinary: Negative for dysuria and difficulty urinating.  Musculoskeletal: Negative for myalgias and arthralgias.  Skin: History of wound to right foot although this appears to be largely resolved. Neurological: Negative for dizziness and weakness.  Psychiatric/Behavioral: Negative for behavioral problems, confusion and agitation.    Past Medical History  Diagnosis Date  . Hypertension   . Cataract     both eyes  . Osteoarthritis   . Glaucoma   . Poor circulation   . CAD (coronary artery disease)     a. 02/2001 PTCA & BMS to the OM1;  b. 08/2001 CBA to ISR in OM1, CBA of mid LAD;  c. 02/2002 Failed PCI of mLAD occlusion, LCX nl, patent OM1 stent, RCA dom, 40p, PDA/PL nl, EF 50% mild antlat HK.  Marland Kitchen Thyroid disease     hypothyroidism, hypothyroidism  . Hypertriglyceridemia    Past Surgical History  Procedure Laterality Date  . Abdominal hysterectomy  1972  . Eye surgery  2007/2008    cataracts  . Cardiac surgery  2001    stent placement  . Hernia repair  2009  . Gallbladder surgery  2009  . Hip arthroplasty  06/06/2012    Procedure: ARTHROPLASTY BIPOLAR HIP;  Surgeon: Eldred Manges, MD;  Location: [facility];  Service: Orthopedics;  Laterality: Right;  monopolar   Social History:   reports that she has never smoked. She has never used smokeless tobacco. She reports that she does not drink alcohol or use illicit drugs.  Family History  Problem Relation Age of Onset  . Arthritis Father   . Stroke Father   . Hypertension Father   . Heart disease Sister   . Heart disease Brother   . Hypertension Brother   . Stroke Brother     Medications: Patient's Medications  New Prescriptions   No medications on file  Previous Medications   ACETAMINOPHEN (TYLENOL) 500 MG TABLET    Take 500 mg by mouth at bedtime. pain   ASPIRIN 81 MG CHEWABLE TABLET    Chew 81 mg by mouth daily.   BISACODYL (DULCOLAX) 10 MG SUPPOSITORY    Place 10 mg rectally as needed for moderate constipation (for  constipation not relieved by milk of magnesium).   CLOPIDOGREL (PLAVIX) 75 MG TABLET    Take 75 mg by mouth daily.    DORZOLAMIDE HCL-TIMOLOL MAL PF 22.3-6.8 MG/ML SOLN    Place 1 drop into both eyes every 12 (twelve) hours.    FISH OIL-OMEGA-3 FATTY ACIDS 1000 MG CAPSULE    Take 1 g by mouth daily.   FUROSEMIDE (LASIX) 20 MG TABLET    Take 20 mg by mouth daily.   LATANOPROST (XALATAN) 0.005 % OPHTHALMIC SOLUTION    Place 1 drop into both eyes at bedtime.   MAGNESIUM HYDROXIDE (MILK OF MAGNESIA) 400 MG/5ML SUSPENSION    Take 30 mLs by mouth daily as needed for mild constipation.   METOPROLOL TARTRATE (LOPRESSOR) 25 MG TABLET    Take 25 mg by mouth 2 (two) times daily.   MULTIPLE VITAMINS-MINERALS (ICAPS) TABS    Take 1 tablet by mouth daily.   OXYCODONE (ROXICODONE) 5 MG IMMEDIATE RELEASE TABLET    Take 1 tablet (5 mg total) by mouth every 4 (four) hours as needed for severe pain.   PANTOPRAZOLE (PROTONIX) 40 MG TABLET    Take 1 tablet (40 mg total) by mouth daily.   PROMETHAZINE (PHENERGAN) 25 MG TABLET    Take 25 mg by mouth every 6 (six) hours as needed for nausea or vomiting.   SODIUM PHOSPHATES (RA SALINE ENEMA) 19-7 GM/118ML ENEM    Place 1 enema rectally daily as needed (constipation not relieved by milk of magnesium, or bisacodyl suppository).   TRAMADOL (ULTRAM) 50 MG TABLET    Take 1 tablet (50 mg total) by mouth every 6 (six) hours as needed.  Modified Medications   No medications on file  Discontinued Medications   No medications on file     Physical Exam:                        Physical Exam    Temperature 98.1 pulse 68 respirations 20 blood pressure 119/71 112/59 most recently  Constitutional: She appears well-developed and well-nourished. No distress  HENT:  Mouth/Throat: Oropharynx is clear and moist. No oropharyngeal exudate  Eyes visual acuity appears grossly intact--she is able to tell me I am wearing glasses what color shirt I'm wearing however she does  complain of blurred vision sclera and conjunctiva are clear Extraocular movements intact-pupils appear reactive  Neck: Normal range of motion. Neck supple.  I could not appreciate any adenopathy Cardiovascular: Normal rate, regular rhythm, normal heart sounds although somewhat distant and reduced distal pulses.--I suspect this is secondary to the edema she has 1+ lower extremity edema a bit more pronounced in her feet this appears to be fairly baseline-this is cool to touch nonerythematous   Pulmonary/Chest: Effort normal -- clear to auscultation there is no labored breathing  Abdominal: Soft. Bowel sounds are normal. She exhibits no distension. There is no tenderness.  Musculoskeletal: Normal range of motion.  did not note any deformities she is ambulatory in a wheelchair  Neurological: She is alert and grossly oriented pleasant and conversational. Her speech is clear no lateralizing findings--  Skin: Skin is warm and dry. --  Psychiatric: She has a normal mood and affect.    Labs reviewed:  01/23/2015.  ZOX-0.960.    12/05/2014.  Sodium 145 potassium 4.8 BUN 33 creatinine 1.57.--Liver function tests within normal limits  WBC 8.6 hemoglobin 10.9 platelets 249  BNP-100  TSH-11.133 Basic Metabolic Panel:  45/40/9811.  Sodium 138 potassium 3.7 BUN 30 creatinine 1.4.  Liver function tests within normal limits.  WBC 11.2 hemoglobin 10.6 platelets 250  08/21/2014.  Sodium 139 potassium 4 BUN 36 creatinine 1.5  Recent Labs  04/11/14 0340 04/13/14 0523 04/14/14 0453  NA 141 137 137  K 3.8 4.1 4.0  CL 101 99 98  CO2 28 28 29   GLUCOSE 108* 90 97  BUN 19 20 21   CREATININE 1.09 1.28* 1.39*  CALCIUM 9.2 9.1 9.4  MG 1.8  --   --   PHOS 3.1  --   --    Liver Function Tests:  Recent Labs  04/11/14 0340 04/13/14 0523  AST 19 19  ALT 12 10  ALKPHOS 78 72  BILITOT 0.5 0.3  PROT 7.1 6.9  ALBUMIN 3.0* 2.7*   No results for input(s): LIPASE, AMYLASE in the last 8760  hours. No results for input(s): AMMONIA in the last 8760 hours. CBC:   07/25/2014.   The WBC 8.8 hemoglobin 11.2 platelets 274  Recent Labs  04/10/14 2150 04/11/14 0340 04/13/14 0523 04/14/14 0453  WBC 10.3 9.4 8.7 9.6  NEUTROABS 6.2 5.5  --   --   HGB 11.5* 10.5* 10.1* 10.3*  HCT 34.8* 32.4* 31.7* 32.6*  MCV 95.9 95.9 99.4 99.4  PLT 243 243 215 255   TSH:  Recent Labs  10/26/13 1842 04/11/14 0340  TSH 6.149* 4.220   A1C: No results found for: HGBA1C Lipid Panel:  Recent Labs  10/27/13 0531  CHOL 199  HDL 38*  LDLCALC 120*  TRIG 204*  CHOLHDL 5.2      Assessment/Plan 1. Renal insufficiency - Her Lasix was  decreased to 20 mg a day secondary to renal issues--will update a metabolic panel I  Edema appears relatively baseline   2. Anemia, unspecified anemia type This appears stable most recently 10.9   on04/07/2015 --will update this  3. Coronary artery disease involving native coronary artery of native heart without angina pectoris -no reports of chest pains or shortness of breath -conts on plavix and ASA  4. Right foot wound This appears to be largely resolved  5. Essential hypertension, benign -blood pressure controlled on current medications, -- lopressor--    6. Osteoarthritis, unspecified osteoarthritis type, unspecified site -no complaints of pain at this time, conts PRNs  7. Glaucoma conts on scheduled drops--is followed by ophthalmology-will write an order to see when follow-up is scheduled  8. Edema -  As noted above this appears to be stable--I note a BNP done in April was unremarkable at 100  .  I also note back in 2013 her BNP was 1110 I suspect there is an element of chronicity to this  I do not see a cardiac echo in Epic--or a formal diagnosis of CHF-from what I have been able to assess in her chart  #  9-history of elevated TSH of 11.133 on lab done in April-she has been started on Synthroid update TSH's have trended down will  update this   [id number]

## 2015-02-20 LAB — BASIC METABOLIC PANEL
BUN: 29 mg/dL — AB (ref 4–21)
Creatinine: 1.4 mg/dL — AB (ref 0.5–1.1)
Glucose: 75 mg/dL
POTASSIUM: 3.8 mmol/L (ref 3.4–5.3)
SODIUM: 138 mmol/L (ref 137–147)

## 2015-02-20 LAB — CBC AND DIFFERENTIAL
HEMATOCRIT: 35 % — AB (ref 36–46)
Hemoglobin: 11.4 g/dL — AB (ref 12.0–16.0)
Platelets: 253 10*3/uL (ref 150–399)
WBC: 8.5 10^3/mL

## 2015-02-20 LAB — TSH: TSH: 4.34 u[IU]/mL (ref 0.41–5.90)

## 2015-06-07 ENCOUNTER — Non-Acute Institutional Stay (SKILLED_NURSING_FACILITY): Payer: Medicare Other | Admitting: Internal Medicine

## 2015-06-07 DIAGNOSIS — N289 Disorder of kidney and ureter, unspecified: Secondary | ICD-10-CM | POA: Diagnosis not present

## 2015-06-07 DIAGNOSIS — R609 Edema, unspecified: Secondary | ICD-10-CM

## 2015-06-07 DIAGNOSIS — I25118 Atherosclerotic heart disease of native coronary artery with other forms of angina pectoris: Secondary | ICD-10-CM | POA: Diagnosis not present

## 2015-06-07 DIAGNOSIS — I10 Essential (primary) hypertension: Secondary | ICD-10-CM | POA: Diagnosis not present

## 2015-06-07 NOTE — Progress Notes (Signed)
Patient ID: [name] [name], female   DOB: [birth year], 81 y.o.   MRN: [id number]         this is a routine visit.  Level care skilled.  Facility [facility].   Chief Complaint  Patient presents with  . Medical Management of Chronic Issues-- including coronary artery disease-edema-anemia-renal insufficiency-hypertension     HPI:  Patient is a 81 y.o. female seen today at [facility] for routine follow up on chronic conditions -- Pt with pmh of CAD, LE edema, HTN, Renal Insufficiency, OA, glaucoma. Pt has been in her usual state of health over the last month. She has a history of coronary artery disease and actually has goneto the ER months ago for chest pain but cardiac enzymes were negative she was evaluated by cardiology and some back to the facility this has been stable for some time--she continues on Plavix and aspirin.  She is also on fish oil  She has some chronic lower extremity edema she is on Lasix 20 mg a day had been on a higher dose but there were concerns about her renal insufficiency-she does not complain of any cough or shortness of breath--9 and in June was 1.4 with BUN of 29 this appears to be relatively baseline this will warrant updating  She also has a history of glaucoma she is followed by ophthalmology she is on topical eyedrops for that-she continues to complain at times of some blurred vision which is not new  Currently she has no complaints  Vital signs appear to be stable  .   Marland Kitchen.  Review of Systems:  Review of Systems  Constitutional: Negative for activity change, appetite change, fatigue and unexpected weight change.  HENT: Negative for congestion.   Eyes: Positive for visual disturbance (hx of glaucoma).  Respiratory:negative  cough--negative shortness of breath.   Cardiovascular: Negative for chest pain, palpitations --she does have some leg edema.  Gastrointestinal: Negative for abdominal pain, diarrhea and constipation.    Genitourinary: Negative for dysuria and difficulty urinating.  Musculoskeletal: Negative for myalgias and arthralgias.  Skin: History of wound to right foot although this appears to be largely resolved. Neurological: Negative for dizziness and weakness.  Psychiatric/Behavioral: Negative for behavioral problems, confusion and agitation.    Past Medical History  Diagnosis Date  . Hypertension   . Cataract     both eyes  . Osteoarthritis   . Glaucoma   . Poor circulation   . CAD (coronary artery disease)     a. 02/2001 PTCA & BMS to the OM1;  b. 08/2001 CBA to ISR in OM1, CBA of mid LAD;  c. 02/2002 Failed PCI of mLAD occlusion, LCX nl, patent OM1 stent, RCA dom, 40p, PDA/PL nl, EF 50% mild antlat HK.  Marland Kitchen. Thyroid disease     hypothyroidism, hypothyroidism  . Hypertriglyceridemia    Past Surgical History  Procedure Laterality Date  . Abdominal hysterectomy  1972  . Eye surgery  2007/2008    cataracts  . Cardiac surgery  2001    stent placement  . Hernia repair  2009  . Gallbladder surgery  2009  . Hip arthroplasty  06/06/2012    Procedure: ARTHROPLASTY BIPOLAR HIP;  Surgeon: Eldred MangesMark C Yates, MD;  Location: [facility];  Service: Orthopedics;  Laterality: Right;  monopolar   Social History:   reports that she has never smoked. She has never used smokeless tobacco. She reports that she does not drink alcohol or use illicit drugs.  Family  History  Problem Relation Age of Onset  . Arthritis Father   . Stroke Father   . Hypertension Father   . Heart disease Sister   . Heart disease Brother   . Hypertension Brother   . Stroke Brother     Medications: Patient's Medications  New Prescriptions   No medications on file  Previous Medications   ACETAMINOPHEN (TYLENOL) 500 MG TABLET    Take 500 mg by mouth at bedtime. pain   ASPIRIN 81 MG CHEWABLE TABLET    Chew 81 mg by mouth daily.   BISACODYL (DULCOLAX) 10 MG SUPPOSITORY    Place 10 mg rectally as needed for moderate constipation (for  constipation not relieved by milk of magnesium).   CLOPIDOGREL (PLAVIX) 75 MG TABLET    Take 75 mg by mouth daily.    DORZOLAMIDE HCL-TIMOLOL MAL PF 22.3-6.8 MG/ML SOLN    Place 1 drop into both eyes every 12 (twelve) hours.    FISH OIL-OMEGA-3 FATTY ACIDS 1000 MG CAPSULE    Take 1 g by mouth daily.   FUROSEMIDE (LASIX) 20 MG TABLET    Take 20 mg by mouth daily.   LATANOPROST (XALATAN) 0.005 % OPHTHALMIC SOLUTION    Place 1 drop into both eyes at bedtime.   MAGNESIUM HYDROXIDE (MILK OF MAGNESIA) 400 MG/5ML SUSPENSION    Take 30 mLs by mouth daily as needed for mild constipation.   METOPROLOL TARTRATE (LOPRESSOR) 25 MG TABLET    Take 25 mg by mouth 2 (two) times daily.   MULTIPLE VITAMINS-MINERALS (ICAPS) TABS    Take 1 tablet by mouth daily.   OXYCODONE (ROXICODONE) 5 MG IMMEDIATE RELEASE TABLET    Take 1 tablet (5 mg total) by mouth every 4 (four) hours as needed for severe pain.   PANTOPRAZOLE (PROTONIX) 40 MG TABLET    Take 1 tablet (40 mg total) by mouth daily.   PROMETHAZINE (PHENERGAN) 25 MG TABLET    Take 25 mg by mouth every 6 (six) hours as needed for nausea or vomiting.   SODIUM PHOSPHATES (RA SALINE ENEMA) 19-7 GM/118ML ENEM    Place 1 enema rectally daily as needed (constipation not relieved by milk of magnesium, or bisacodyl suppository).   TRAMADOL (ULTRAM) 50 MG TABLET    Take 1 tablet (50 mg total) by mouth every 6 (six) hours as needed.  Modified Medications   No medications on file  Discontinued Medications   No medications on file     Physical Exam:                        Physical Exam    She is afebrile pulse 73 respirations 18 blood pressure 140/83 125/76 112/62  Constitutional: She appears well-developed and well-nourished. No distress  HENT:  Mouth/Throat: Oropharynx is clear and moist. No oropharyngeal exudate  Eyes visual acuity appears grossly intact-has history of blurriness with history of glaucoma-  sclera and conjunctiva are clear Extraocular  movements intact-pupils appear reactive  Neck: Normal range of motion. Neck supple.  I could not appreciate any adenopathy Cardiovascular: Normal rate, regular rhythm, normal heart sounds although somewhat distant and reduced distal pulses.--I suspect this is secondary to the edema she has 1+ lower extremity edema a bit more pronounced on the left---it is  nonerythematous   Pulmonary/Chest: Effort normal -- clear to auscultation there is no labored breathing  Abdominal: Soft. Bowel sounds are normal. She exhibits no distension. There is no tenderness.  Musculoskeletal: Normal range  of motion.  did not note any deformities she is ambulatory in a wheelchair  Neurological: She is alert and grossly oriented pleasant and conversational. Her speech is clear no lateralizing findings--  Skin: Skin is warm and dry. --  Psychiatric: She has a normal mood and affect.    Labs reviewed   02/20/2015.  WBC 8.5 hemoglobin 11.4 platelets 253. Sodium 138 potassium 3.8 BUN 29 creatinine 1.4.TSH4.34.  12/05/2014.  Albumin 3.68-liver function tests within normal limits.  BNP-100 :  01/23/2015.  QIO-9.629.    12/05/2014.  Sodium 145 potassium 4.8 BUN 33 creatinine 1.57.--Liver function tests within normal limits  WBC 8.6 hemoglobin 10.9 platelets 249  BNP-100  TSH-11.133 Basic Metabolic Panel:  52/84/1324.  Sodium 138 potassium 3.7 BUN 30 creatinine 1.4.  Liver function tests within normal limits.  WBC 11.2 hemoglobin 10.6 platelets 250  08/21/2014.  Sodium 139 potassium 4 BUN 36 creatinine 1.5  Recent Labs  04/11/14 0340 04/13/14 0523 04/14/14 0453  NA 141 137 137  K 3.8 4.1 4.0  CL 101 99 98  CO2 GLUCOSE 108* 90 97  BUN CREATININE 1.09 1.28* 1.39*  CALCIUM 9.2 9.1 9.4  MG 1.8  --   --   PHOS 3.1  --   --    Liver Function Tests:  Recent Labs  04/11/14 0340 04/13/14 0523  AST 19 19  ALT 12 10  ALKPHOS 78 72  BILITOT 0.5 0.3  PROT 7.1 6.9    ALBUMIN 3.0* 2.7*   No results for input(s): LIPASE, AMYLASE in the last 8760 hours. No results for input(s): AMMONIA in the last 8760 hours. CBC:   07/25/2014.   The WBC 8.8 hemoglobin 11.2 platelets 274  Recent Labs  04/10/14 2150 04/11/14 0340 04/13/14 0523 04/14/14 0453  WBC 10.3 9.4 8.7 9.6  NEUTROABS 6.2 5.5  --   --   HGB 11.5* 10.5* 10.1* 10.3*  HCT 34.8* 32.4* 31.7* 32.6*  MCV 95.9 95.9 99.4 99.4  PLT 243 243 215 255   TSH:  Recent Labs  10/26/13 1842 04/11/14 0340  TSH 6.149* 4.220   A1C: No results found for: HGBA1C Lipid Panel:  Recent Labs  10/27/13 0531  CHOL 199  HDL 38*  LDLCALC 120*  TRIG 204*  CHOLHDL 5.2      Assessment/Plan 1. Renal insufficiency - Her Lasix was  decreased to 20 mg a day secondary to renal issues--will update a metabolic panel I  Edema appears relatively baseline   2. Anemia, unspecified anemia type This appears stable most recently 11.4 --will update this  3. Coronary artery disease involving native coronary artery of native heart without angina pectoris -no reports of chest pains or shortness of breath -conts on plavix and ASA  4. Right foot wound This appears to be largely resolved  5. Essential hypertension, benign -blood pressure controlled on current medications, -- lopressor--    6. Osteoarthritis, unspecified osteoarthritis type, unspecified site -no complaints of pain at this time, conts PRNs  7. Glaucoma conts on scheduled drops--is followed by ophthalmology-  8. Edema - -I note a BNP done in April was unremarkable at 100  There is some mildly increased edema on the left will order a venous Doppler to rule out any chance for DVT I suspect the likelihood is low  Also will update a CMP  .     I do not see a cardiac echo in Epic--or a formal diagnosis of CHF-from what  I have been able to assess in her chart  #9-history of elevated TSH of 11.133 on lab done in April-she has been started  on Synthroid update TSH's have trended down most recently 4.34 we will update this   [phone]

## 2015-06-08 LAB — BASIC METABOLIC PANEL
BUN: 27 mg/dL — AB (ref 4–21)
Creatinine: 1.4 mg/dL — AB (ref 0.5–1.1)
GLUCOSE: 111 mg/dL
Potassium: 3.8 mmol/L (ref 3.4–5.3)
SODIUM: 141 mmol/L (ref 137–147)

## 2015-06-08 LAB — HEPATIC FUNCTION PANEL
ALT: 16 U/L (ref 7–35)
AST: 24 U/L (ref 13–35)
Alkaline Phosphatase: 64 U/L (ref 25–125)
BILIRUBIN, TOTAL: 0.2 mg/dL

## 2015-06-08 LAB — CBC AND DIFFERENTIAL
HEMATOCRIT: 32 % — AB (ref 36–46)
HEMOGLOBIN: 10.6 g/dL — AB (ref 12.0–16.0)
Platelets: 255 10*3/uL (ref 150–399)
WBC: 7 10*3/mL

## 2015-06-08 LAB — TSH: TSH: 0.19 u[IU]/mL — AB (ref 0.41–5.90)

## 2015-06-17 ENCOUNTER — Encounter: Payer: Self-pay | Admitting: Internal Medicine

## 2015-07-20 LAB — TSH: TSH: 0.07 u[IU]/mL — AB (ref 0.41–5.90)

## 2015-07-26 DIAGNOSIS — N179 Acute kidney failure, unspecified: Secondary | ICD-10-CM

## 2015-07-26 HISTORY — DX: Acute kidney failure, unspecified: N17.9

## 2015-08-02 ENCOUNTER — Non-Acute Institutional Stay (SKILLED_NURSING_FACILITY): Payer: Medicare Other | Admitting: Internal Medicine

## 2015-08-02 ENCOUNTER — Encounter: Payer: Self-pay | Admitting: Internal Medicine

## 2015-08-02 DIAGNOSIS — I25118 Atherosclerotic heart disease of native coronary artery with other forms of angina pectoris: Secondary | ICD-10-CM

## 2015-08-02 DIAGNOSIS — M159 Polyosteoarthritis, unspecified: Secondary | ICD-10-CM

## 2015-08-02 DIAGNOSIS — I10 Essential (primary) hypertension: Secondary | ICD-10-CM | POA: Diagnosis not present

## 2015-08-02 DIAGNOSIS — D631 Anemia in chronic kidney disease: Secondary | ICD-10-CM

## 2015-08-02 DIAGNOSIS — M150 Primary generalized (osteo)arthritis: Secondary | ICD-10-CM

## 2015-08-02 DIAGNOSIS — R609 Edema, unspecified: Secondary | ICD-10-CM

## 2015-08-02 DIAGNOSIS — H409 Unspecified glaucoma: Secondary | ICD-10-CM | POA: Diagnosis not present

## 2015-08-02 DIAGNOSIS — N289 Disorder of kidney and ureter, unspecified: Secondary | ICD-10-CM

## 2015-08-02 DIAGNOSIS — N189 Chronic kidney disease, unspecified: Secondary | ICD-10-CM

## 2015-08-02 NOTE — Progress Notes (Signed)
Patient ID: [name] [name], female   DOB: 25-Jul-1919, 81 y.o.   MRN: [id number]          this is a routine visit.  Level care skilled.  Facility [facility].   Chief Complaint  Patient presents with  . Medical Management of Chronic Issues-- including coronary artery disease-edema-anemia-renal insufficiency-hypertension     HPI:  Patient is a 81 y.o. female seen today at [facility] for routine follow up on chronic conditions -- Pt with pmh of CAD, LE edema, HTN, Renal Insufficiency, OA, glaucoma. Pt has been in her usual state of health--. She has a history of coronary artery disease and actually has goneto the ER months ago for chest pain but cardiac enzymes were negative she was evaluated by cardiology and came back to the facility this has been stable for some time--she continues on Plavix and aspirin.  She is also on fish oil  She has some chronic lower extremity edema she is on Lasix 20 mg a day had been on a higher dose but there were concerns about her renal insufficiency-she does not complain of any cough or shortness of breath-creatinine on 06/08/2015 was 1.4 BUN 27 this appears to be relatively baseline this will warrant updating  She also has a history of glaucoma she is followed by ophthalmology she is on topical eyedrops for that-she continues to complain at times of some blurred vision which is not new Patient tells me she recently fell and had no apparent injuries however she says she does have some soreness in her right hip area when she ambulates   Vital signs appear to be stable  .   Marland Kitchen  Review of Systems:  Review of Systems  Constitutional: Negative for activity change, appetite change, fatigue and unexpected weight change.  HENT: Negative for congestion.   Eyes: Positive for visual disturbance (hx of glaucoma).  Respiratory:negative  cough--negative shortness of breath.   Cardiovascular: Negative for chest pain, palpitations --she  does have some leg edema.  Gastrointestinal: Negative for abdominal pain, diarrhea and constipation.  Genitourinary: Negative for dysuria and difficulty urinating.  Musculoskeletal: Says she has some right hip soreness when she ambulates otherwise no complaints.  Skin: History of wound to right foot although this appears to be  resolved. Neurological: Negative for dizziness and weakness.  Psychiatric/Behavioral: Negative for behavioral problems, confusion and agitation.    Past Medical History  Diagnosis Date  . Hypertension   . Cataract     both eyes  . Osteoarthritis   . Glaucoma   . Poor circulation   . CAD (coronary artery disease)     a. 02/2001 PTCA & BMS to the OM1;  b. 08/2001 CBA to ISR in OM1, CBA of mid LAD;  c. 02/2002 Failed PCI of mLAD occlusion, LCX nl, patent OM1 stent, RCA dom, 40p, PDA/PL nl, EF 50% mild antlat HK.  Marland Kitchen Thyroid disease     hypothyroidism, hypothyroidism  . Hypertriglyceridemia    Past Surgical History  Procedure Laterality Date  . Abdominal hysterectomy  1972  . Eye surgery  2007/2008    cataracts  . Cardiac surgery  2001    stent placement  . Hernia repair  2009  . Gallbladder surgery  2009  . Hip arthroplasty  06/06/2012    Procedure: ARTHROPLASTY BIPOLAR HIP;  Surgeon: Eldred Manges, MD;  Location: [facility];  Service: Orthopedics;  Laterality: Right;  monopolar   Social History:   reports that she  has never smoked. She has never used smokeless tobacco. She reports that she does not drink alcohol or use illicit drugs.  Family History  Problem Relation Age of Onset  . Arthritis Father   . Stroke Father   . Hypertension Father   . Heart disease Sister   . Heart disease Brother   . Hypertension Brother   . Stroke Brother     Medications: Patient's Medications  New Prescriptions   No medications on file  Previous Medications   ACETAMINOPHEN (TYLENOL) 500 MG TABLET    Take 500 mg by mouth at bedtime. pain   ASPIRIN 81 MG CHEWABLE TABLET     Chew 81 mg by mouth daily.   BISACODYL (DULCOLAX) 10 MG SUPPOSITORY    Place 10 mg rectally as needed for moderate constipation (for constipation not relieved by milk of magnesium).   CLOPIDOGREL (PLAVIX) 75 MG TABLET    Take 75 mg by mouth daily.    DORZOLAMIDE HCL-TIMOLOL MAL PF 22.3-6.8 MG/ML SOLN    Place 1 drop into both eyes every 12 (twelve) hours.    FISH OIL-OMEGA-3 FATTY ACIDS 1000 MG CAPSULE    Take 1 g by mouth daily.   FUROSEMIDE (LASIX) 20 MG TABLET    Take 20 mg by mouth daily.   LATANOPROST (XALATAN) 0.005 % OPHTHALMIC SOLUTION    Place 1 drop into both eyes at bedtime.   MAGNESIUM HYDROXIDE (MILK OF MAGNESIA) 400 MG/5ML SUSPENSION    Take 30 mLs by mouth daily as needed for mild constipation.   METOPROLOL TARTRATE (LOPRESSOR) 25 MG TABLET    Take 25 mg by mouth 2 (two) times daily.   MULTIPLE VITAMINS-MINERALS (ICAPS) TABS    Take 1 tablet by mouth daily.   OXYCODONE (ROXICODONE) 5 MG IMMEDIATE RELEASE TABLET    Take 1 tablet (5 mg total) by mouth every 4 (four) hours as needed for severe pain.   PANTOPRAZOLE (PROTONIX) 40 MG TABLET    Take 1 tablet (40 mg total) by mouth daily.   PROMETHAZINE (PHENERGAN) 25 MG TABLET    Take 25 mg by mouth every 6 (six) hours as needed for nausea or vomiting.   SODIUM PHOSPHATES (RA SALINE ENEMA) 19-7 GM/118ML ENEM    Place 1 enema rectally daily as needed (constipation not relieved by milk of magnesium, or bisacodyl suppository).   TRAMADOL (ULTRAM) 50 MG TABLET    Take 1 tablet (50 mg total) by mouth every 6 (six) hours as needed.  Modified Medications   No medications on file  Discontinued Medications   No medications on file     Physical Exam:                        Physical Exam   She is afebrile pulse of 88 respirations 18 blood pressure 110/68-123/58-143/68 most recently  Constitutional: She appears well-developed and well-nourished. No distress  HENT:  Mouth/Throat: Oropharynx is clear and moist. No oropharyngeal  exudate  Eyes visual acuity appears grossly intact-has history of blurriness with history of glaucoma-  sclera and conjunctiva are clear Extraocular movements intact-pupils appear reactive   Cardiovascular: Normal rate, regular rhythm, normal heart sounds although somewhat distant and reduced distal pulses.--I suspect this is secondary to the edema she has 1+ lower extremity---it is  nonerythematous   Pulmonary/Chest: Effort normal -- clear to auscultation there is no labored breathing  Abdominal: Soft. Bowel sounds are normal. She exhibits no distension. There is no tenderness.  Musculoskeletal:  Normal range of motion.  did not note any deformities--she is able to stand and use her walker right hip does not appear to have any deformities I do not see any bruising-there is a well-healed surgical scar it appears-there appears to be possibly some mild discomfort with flexion and extension at the hip although this is not very dramatic  Neurological: She is alert and grossly oriented pleasant and conversational. Her speech is clear no lateralizing findings--  Skin: Skin is warm and dry. --  Psychiatric: She has a normal mood and affect.    Labs reviewed 06/08/2015.  Sodium 141 potassium 3.8 BUN 27 creatinine 1.4 CO2 30.  Albumin 3.2-bilirubin 0.2-otherwise liver function tests within normal limits.  TSH-0.19.  WBC 7.0 hemoglobin 10.6 platelets 255   02/20/2015.  WBC 8.5 hemoglobin 11.4 platelets 253. Sodium 138 potassium 3.8 BUN 29 creatinine 1.4.TSH4.34.  12/05/2014.  Albumin 3.68-liver function tests within normal limits.  BNP-100 :  01/23/2015.  WUJ-8.119TSH-4.653.    12/05/2014.  Sodium 145 potassium 4.8 BUN 33 creatinine 1.57.--Liver function tests within normal limits  WBC 8.6 hemoglobin 10.9 platelets 249  BNP-100  TSH-11.133 Basic Metabolic Panel:  14/78/295601/29/2016.  Sodium 138 potassium 3.7 BUN 30 creatinine 1.4.  Liver function tests within normal limits.  WBC 11.2  hemoglobin 10.6 platelets 250  08/21/2014.  Sodium 139 potassium 4 BUN 36 creatinine 1.5  Recent Labs  04/11/14 0340 04/13/14 0523 04/14/14 0453  NA 141 137 137  K 3.8 4.1 4.0  CL 101 99 98  CO2 28 28 29   GLUCOSE 108* 90 97  BUN 19 20 21   CREATININE 1.09 1.28* 1.39*  CALCIUM 9.2 9.1 9.4  MG 1.8  --   --   PHOS 3.1  --   --    Liver Function Tests:  Recent Labs  04/11/14 0340 04/13/14 0523  AST 19 19  ALT 12 10  ALKPHOS 78 72  BILITOT 0.5 0.3  PROT 7.1 6.9  ALBUMIN 3.0* 2.7*   No results for input(s): LIPASE, AMYLASE in the last 8760 hours. No results for input(s): AMMONIA in the last 8760 hours. CBC:   07/25/2014.   The WBC 8.8 hemoglobin 11.2 platelets 274  Recent Labs  04/10/14 2150 04/11/14 0340 04/13/14 0523 04/14/14 0453  WBC 10.3 9.4 8.7 9.6  NEUTROABS 6.2 5.5  --   --   HGB 11.5* 10.5* 10.1* 10.3*  HCT 34.8* 32.4* 31.7* 32.6*  MCV 95.9 95.9 99.4 99.4  PLT 243 243 215 255   TSH:  Recent Labs  10/26/13 1842 04/11/14 0340  TSH 6.149* 4.220   A1C: No results found for: HGBA1C Lipid Panel:  Recent Labs  10/27/13 0531  CHOL 199  HDL 38*  LDLCALC 120*  TRIG 204*  CHOLHDL 5.2      Assessment/Plan 1. Renal insufficiency - Her Lasix was  decreased to 20 mg a day secondary to renal issues--will update a metabolic panel I  Edema appears relatively baseline-most recent creatinine 1.4 appears to be relatively baseline   2. Anemia, unspecified anemia type This appears stable most recently 10.6 --will update this  3. Coronary artery disease involving native coronary artery of native heart without angina pectoris -no reports of chest pains or shortness of breath -conts on plavix and ASA  4. Right foot wound This appears to be largely resolved  5. Essential hypertension, benign -blood pressure controlled on current medications, -- lopressor--    6. Osteoarthritis, unspecified osteoarthritis type, unspecified site -Does complain of  some  right hip soreness will order an x-ray of the area including the hip pelvis lumbar and sacral spines-there is some questionable history of fall  7. Glaucoma conts on scheduled drops--is followed by ophthalmology-  8. Edema - -I note a BNP done in April was unremarkable at 100    I do not see a cardiac echo in Epic--or a formal diagnosis of CHF-from what I have been able to assess in her chart  #9-history of elevated TSH of 11.133 on lab done in April- She was started on Synthroid actually been titrating this down secondary to low TSH however TSH continues to be low despite being on the lowest dose of Synthroid- have discontinued the Synthroid and update a TSH in 6 weeks   [id number]

## 2015-08-03 LAB — CBC AND DIFFERENTIAL
HEMATOCRIT: 37 % (ref 36–46)
HEMOGLOBIN: 12 g/dL (ref 12.0–16.0)
PLATELETS: 289 10*3/uL (ref 150–399)
WBC: 8.8 10^3/mL

## 2015-08-03 LAB — BASIC METABOLIC PANEL
BUN: 31 mg/dL — AB (ref 4–21)
Creatinine: 1.5 mg/dL — AB (ref 0.5–1.1)
Glucose: 110 mg/dL
POTASSIUM: 3.8 mmol/L (ref 3.4–5.3)
Sodium: 137 mmol/L (ref 137–147)

## 2015-08-24 ENCOUNTER — Emergency Department (HOSPITAL_COMMUNITY): Payer: Medicare Other

## 2015-08-24 ENCOUNTER — Encounter (HOSPITAL_COMMUNITY): Payer: Self-pay | Admitting: Emergency Medicine

## 2015-08-24 ENCOUNTER — Inpatient Hospital Stay (HOSPITAL_COMMUNITY)
Admission: EM | Admit: 2015-08-24 | Discharge: 2015-08-27 | DRG: 690 | Disposition: A | Discharge status: Skilled Nursing Facility | Payer: Medicare Other | Attending: Internal Medicine | Admitting: Internal Medicine

## 2015-08-24 DIAGNOSIS — R059 Cough, unspecified: Secondary | ICD-10-CM

## 2015-08-24 DIAGNOSIS — I2510 Atherosclerotic heart disease of native coronary artery without angina pectoris: Secondary | ICD-10-CM | POA: Diagnosis present

## 2015-08-24 DIAGNOSIS — R112 Nausea with vomiting, unspecified: Secondary | ICD-10-CM | POA: Diagnosis present

## 2015-08-24 DIAGNOSIS — Z8249 Family history of ischemic heart disease and other diseases of the circulatory system: Secondary | ICD-10-CM

## 2015-08-24 DIAGNOSIS — Z823 Family history of stroke: Secondary | ICD-10-CM | POA: Diagnosis not present

## 2015-08-24 DIAGNOSIS — E039 Hypothyroidism, unspecified: Secondary | ICD-10-CM | POA: Diagnosis present

## 2015-08-24 DIAGNOSIS — I10 Essential (primary) hypertension: Secondary | ICD-10-CM | POA: Diagnosis not present

## 2015-08-24 DIAGNOSIS — Z96641 Presence of right artificial hip joint: Secondary | ICD-10-CM | POA: Diagnosis present

## 2015-08-24 DIAGNOSIS — I25118 Atherosclerotic heart disease of native coronary artery with other forms of angina pectoris: Secondary | ICD-10-CM

## 2015-08-24 DIAGNOSIS — Z66 Do not resuscitate: Secondary | ICD-10-CM | POA: Diagnosis present

## 2015-08-24 DIAGNOSIS — B9689 Other specified bacterial agents as the cause of diseases classified elsewhere: Secondary | ICD-10-CM | POA: Diagnosis present

## 2015-08-24 DIAGNOSIS — H409 Unspecified glaucoma: Secondary | ICD-10-CM | POA: Diagnosis present

## 2015-08-24 DIAGNOSIS — N179 Acute kidney failure, unspecified: Secondary | ICD-10-CM | POA: Diagnosis present

## 2015-08-24 DIAGNOSIS — N390 Urinary tract infection, site not specified: Principal | ICD-10-CM | POA: Diagnosis present

## 2015-08-24 DIAGNOSIS — B9620 Unspecified Escherichia coli [E. coli] as the cause of diseases classified elsewhere: Secondary | ICD-10-CM | POA: Diagnosis present

## 2015-08-24 DIAGNOSIS — R0902 Hypoxemia: Secondary | ICD-10-CM | POA: Diagnosis present

## 2015-08-24 DIAGNOSIS — R05 Cough: Secondary | ICD-10-CM | POA: Diagnosis not present

## 2015-08-24 DIAGNOSIS — I119 Hypertensive heart disease without heart failure: Secondary | ICD-10-CM | POA: Diagnosis present

## 2015-08-24 DIAGNOSIS — E781 Pure hyperglyceridemia: Secondary | ICD-10-CM | POA: Diagnosis present

## 2015-08-24 DIAGNOSIS — E860 Dehydration: Secondary | ICD-10-CM | POA: Diagnosis present

## 2015-08-24 DIAGNOSIS — M1990 Unspecified osteoarthritis, unspecified site: Secondary | ICD-10-CM | POA: Diagnosis present

## 2015-08-24 DIAGNOSIS — B961 Klebsiella pneumoniae [K. pneumoniae] as the cause of diseases classified elsewhere: Secondary | ICD-10-CM | POA: Diagnosis present

## 2015-08-24 DIAGNOSIS — Z9861 Coronary angioplasty status: Secondary | ICD-10-CM | POA: Diagnosis not present

## 2015-08-24 DIAGNOSIS — N3090 Cystitis, unspecified without hematuria: Secondary | ICD-10-CM | POA: Diagnosis present

## 2015-08-24 HISTORY — DX: Acute kidney failure, unspecified: N17.9

## 2015-08-24 HISTORY — DX: Urinary tract infection, site not specified: N39.0

## 2015-08-24 LAB — CBC WITH DIFFERENTIAL/PLATELET
BASOS ABS: 0 10*3/uL (ref 0.0–0.1)
Basophils Relative: 0 %
EOS PCT: 0 %
Eosinophils Absolute: 0 10*3/uL (ref 0.0–0.7)
HEMATOCRIT: 38.6 % (ref 36.0–46.0)
Hemoglobin: 12.3 g/dL (ref 12.0–15.0)
LYMPHS ABS: 1.4 10*3/uL (ref 0.7–4.0)
LYMPHS PCT: 13 %
MCH: 30.5 pg (ref 26.0–34.0)
MCHC: 31.9 g/dL (ref 30.0–36.0)
MCV: 95.8 fL (ref 78.0–100.0)
MONO ABS: 0.6 10*3/uL (ref 0.1–1.0)
Monocytes Relative: 5 %
NEUTROS ABS: 9.3 10*3/uL — AB (ref 1.7–7.7)
Neutrophils Relative %: 82 %
Platelets: 253 10*3/uL (ref 150–400)
RBC: 4.03 MIL/uL (ref 3.87–5.11)
RDW: 12.9 % (ref 11.5–15.5)
WBC: 11.2 10*3/uL — ABNORMAL HIGH (ref 4.0–10.5)

## 2015-08-24 LAB — COMPREHENSIVE METABOLIC PANEL
ALK PHOS: 86 U/L (ref 38–126)
ALT: 18 U/L (ref 14–54)
ANION GAP: 12 (ref 5–15)
AST: 27 U/L (ref 15–41)
Albumin: 3.3 g/dL — ABNORMAL LOW (ref 3.5–5.0)
BUN: 31 mg/dL — ABNORMAL HIGH (ref 6–20)
CALCIUM: 9.6 mg/dL (ref 8.9–10.3)
CO2: 27 mmol/L (ref 22–32)
Chloride: 101 mmol/L (ref 101–111)
Creatinine, Ser: 1.82 mg/dL — ABNORMAL HIGH (ref 0.44–1.00)
GFR calc Af Amer: 26 mL/min — ABNORMAL LOW (ref >60)
GFR calc non Af Amer: 23 mL/min — ABNORMAL LOW (ref >60)
Glucose, Bld: 146 mg/dL — ABNORMAL HIGH (ref 65–99)
Potassium: 4.1 mmol/L (ref 3.5–5.1)
Sodium: 140 mmol/L (ref 135–145)
TOTAL PROTEIN: 7.9 g/dL (ref 6.5–8.1)
Total Bilirubin: 0.5 mg/dL (ref 0.3–1.2)

## 2015-08-24 LAB — URINALYSIS, ROUTINE W REFLEX MICROSCOPIC
Bilirubin Urine: NEGATIVE
GLUCOSE, UA: NEGATIVE mg/dL
Ketones, ur: NEGATIVE mg/dL
Nitrite: NEGATIVE
PH: 5.5 (ref 5.0–8.0)
Protein, ur: 30 mg/dL — AB
Specific Gravity, Urine: 1.019 (ref 1.005–1.030)

## 2015-08-24 LAB — URINE MICROSCOPIC-ADD ON

## 2015-08-24 LAB — MRSA PCR SCREENING: MRSA BY PCR: POSITIVE — AB

## 2015-08-24 LAB — LIPASE, BLOOD: LIPASE: 40 U/L (ref 11–51)

## 2015-08-24 MED ORDER — HEPARIN SODIUM (PORCINE) 5000 UNIT/ML IJ SOLN
5000.0000 [IU] | Freq: Three times a day (TID) | INTRAMUSCULAR | Status: DC
  Administered 2015-08-24 – 2015-08-26 (×7): 5000 [IU] via SUBCUTANEOUS
  Filled 2015-08-24 (×7): qty 1

## 2015-08-24 MED ORDER — METOPROLOL TARTRATE 25 MG PO TABS
25.0000 mg | ORAL_TABLET | Freq: Two times a day (BID) | ORAL | Status: DC
  Administered 2015-08-24 – 2015-08-27 (×5): 25 mg via ORAL
  Filled 2015-08-24 (×5): qty 1

## 2015-08-24 MED ORDER — LATANOPROST 0.005 % OP SOLN
1.0000 [drp] | Freq: Every day | OPHTHALMIC | Status: DC
  Administered 2015-08-24 – 2015-08-26 (×2): 1 [drp] via OPHTHALMIC
  Filled 2015-08-24: qty 2.5

## 2015-08-24 MED ORDER — ACETAMINOPHEN 650 MG RE SUPP: 650.0000 mg | Freq: Four times a day (QID) | RECTAL | Status: DC | PRN

## 2015-08-24 MED ORDER — CLOPIDOGREL BISULFATE 75 MG PO TABS
75.0000 mg | ORAL_TABLET | Freq: Every day | ORAL | Status: DC
  Administered 2015-08-24 – 2015-08-27 (×4): 75 mg via ORAL
  Filled 2015-08-24 (×4): qty 1

## 2015-08-24 MED ORDER — PANTOPRAZOLE SODIUM 40 MG PO TBEC
40.0000 mg | DELAYED_RELEASE_TABLET | Freq: Every day | ORAL | Status: DC
  Administered 2015-08-24 – 2015-08-27 (×4): 40 mg via ORAL
  Filled 2015-08-24 (×4): qty 1

## 2015-08-24 MED ORDER — LEVOFLOXACIN 750 MG PO TABS
750.0000 mg | ORAL_TABLET | Freq: Once | ORAL | Status: AC
  Administered 2015-08-24: 750 mg via ORAL
  Filled 2015-08-24: qty 1

## 2015-08-24 MED ORDER — ONDANSETRON HCL 4 MG/2ML IJ SOLN
4.0000 mg | Freq: Four times a day (QID) | INTRAMUSCULAR | Status: DC | PRN
  Administered 2015-08-24: 4 mg via INTRAVENOUS
  Filled 2015-08-24: qty 2

## 2015-08-24 MED ORDER — OMEGA-3 FATTY ACIDS 1000 MG PO CAPS: 1.0000 g | ORAL_CAPSULE | Freq: Every day | ORAL | Status: DC

## 2015-08-24 MED ORDER — DORZOLAMIDE HCL-TIMOLOL MAL 2-0.5 % OP SOLN
1.0000 [drp] | Freq: Two times a day (BID) | OPHTHALMIC | Status: DC
  Administered 2015-08-24 – 2015-08-27 (×6): 1 [drp] via OPHTHALMIC
  Filled 2015-08-24: qty 10

## 2015-08-24 MED ORDER — OMEGA-3-ACID ETHYL ESTERS 1 G PO CAPS
1.0000 g | ORAL_CAPSULE | Freq: Every day | ORAL | Status: DC
  Administered 2015-08-24 – 2015-08-27 (×4): 1 g via ORAL
  Filled 2015-08-24 (×4): qty 1

## 2015-08-24 MED ORDER — GUAIFENESIN-DM 100-10 MG/5ML PO SYRP: 5.0000 mL | ORAL_SOLUTION | ORAL | Status: DC | PRN

## 2015-08-24 MED ORDER — ONDANSETRON HCL 4 MG PO TABS: 4.0000 mg | ORAL_TABLET | Freq: Four times a day (QID) | ORAL | Status: DC | PRN

## 2015-08-24 MED ORDER — SODIUM CHLORIDE 0.9 % IV BOLUS (SEPSIS)
1000.0000 mL | Freq: Once | INTRAVENOUS | Status: AC
  Administered 2015-08-24: 1000 mL via INTRAVENOUS

## 2015-08-24 MED ORDER — DEXTROSE 5 % IV SOLN
1.0000 g | Freq: Once | INTRAVENOUS | Status: AC
  Administered 2015-08-24: 1 g via INTRAVENOUS
  Filled 2015-08-24: qty 10

## 2015-08-24 MED ORDER — ADULT MULTIVITAMIN W/MINERALS CH
1.0000 | ORAL_TABLET | Freq: Every day | ORAL | Status: DC
  Administered 2015-08-24 – 2015-08-27 (×4): 1 via ORAL
  Filled 2015-08-24 (×4): qty 1

## 2015-08-24 MED ORDER — DEXTROSE 5 % IV SOLN
1.0000 g | INTRAVENOUS | Status: DC
  Administered 2015-08-24 – 2015-08-26 (×3): 1 g via INTRAVENOUS
  Filled 2015-08-24 (×4): qty 10

## 2015-08-24 MED ORDER — ICAPS AREDS FORMULA PO TABS: 1.0000 | ORAL_TABLET | Freq: Every day | ORAL | Status: DC

## 2015-08-24 MED ORDER — OXYCODONE HCL 5 MG PO TABS: 5.0000 mg | ORAL_TABLET | ORAL | Status: DC | PRN

## 2015-08-24 MED ORDER — SODIUM CHLORIDE 0.9 % IV SOLN
INTRAVENOUS | Status: DC
  Administered 2015-08-24: 16:00:00 via INTRAVENOUS

## 2015-08-24 MED ORDER — ASPIRIN 81 MG PO CHEW
81.0000 mg | CHEWABLE_TABLET | Freq: Every day | ORAL | Status: DC
  Administered 2015-08-24 – 2015-08-27 (×4): 81 mg via ORAL
  Filled 2015-08-24 (×4): qty 1

## 2015-08-24 MED ORDER — ACETAMINOPHEN 500 MG PO TABS: 500.0000 mg | ORAL_TABLET | Freq: Every day | ORAL | Status: DC

## 2015-08-24 MED ORDER — ACETAMINOPHEN 325 MG PO TABS
650.0000 mg | ORAL_TABLET | Freq: Four times a day (QID) | ORAL | Status: DC | PRN
  Administered 2015-08-25 – 2015-08-26 (×2): 650 mg via ORAL
  Filled 2015-08-24 (×3): qty 2

## 2015-08-24 MED ORDER — ALBUTEROL SULFATE (2.5 MG/3ML) 0.083% IN NEBU: 2.5000 mg | INHALATION_SOLUTION | RESPIRATORY_TRACT | Status: DC | PRN

## 2015-08-24 MED ORDER — SODIUM CHLORIDE 0.9 % IJ SOLN: 3.0000 mL | Freq: Two times a day (BID) | INTRAMUSCULAR | Status: DC

## 2015-08-24 NOTE — ED Provider Notes (Signed)
CSN: [id number]     Arrival date & time 08/24/15  4098 History   First MD Initiated Contact with Patient 08/24/15 [phone]     Chief Complaint  Patient presents with  . Emesis     (Consider location/radiation/quality/duration/timing/severity/associated sxs/prior Treatment) HPI Comments: 81yo F w/ PMH including HTN, HLD, CAD who p/w vomiting. Hx limited as patient is a poor historian. She reports 5-6 episodes of yellow emesis since last night into this morning. She initially denied abd pain but later stated she intermittently has abd pain after eating, unknown chronicity. No diarrhea, fevers, or chills. She does endorse cold symptoms with cough and sore throat this week. No chest pain or shortness of breath. He states that her nausea is improved after receiving medication by EMS.  Patient is a 81 y.o. female presenting with vomiting. The history is provided by the patient.  Emesis   Past Medical History  Diagnosis Date  . Hypertension   . Cataract     both eyes  . Osteoarthritis   . Glaucoma   . Poor circulation   . CAD (coronary artery disease)     a. 02/2001 PTCA & BMS to the OM1;  b. 08/2001 CBA to ISR in OM1, CBA of mid LAD;  c. 02/2002 Failed PCI of mLAD occlusion, LCX nl, patent OM1 stent, RCA dom, 40p, PDA/PL nl, EF 50% mild antlat HK.  Marland Kitchen Thyroid disease     hypothyroidism, hypothyroidism  . Hypertriglyceridemia    Past Surgical History  Procedure Laterality Date  . Abdominal hysterectomy  1972  . Eye surgery  2007/2008    cataracts  . Cardiac surgery  2001    stent placement  . Hernia repair  2009  . Gallbladder surgery  2009  . Hip arthroplasty  06/06/2012    Procedure: ARTHROPLASTY BIPOLAR HIP;  Surgeon: Eldred Manges, MD;  Location: [facility];  Service: Orthopedics;  Laterality: Right;  monopolar   Family History  Problem Relation Age of Onset  . Arthritis Father   . Stroke Father   . Hypertension Father   . Heart disease Sister   . Heart disease Brother   .  Hypertension Brother   . Stroke Brother    Social History  Substance Use Topics  . Smoking status: Never Smoker   . Smokeless tobacco: Never Used  . Alcohol Use: No   OB History    Gravida Para Term Preterm AB TAB SAB Ectopic Multiple Living   5 4   1  1         Review of Systems  Gastrointestinal: Positive for vomiting.   10 Systems reviewed and are negative for acute change except as noted in the HPI.    Allergies  Codeine; Morphine and related; and Penicillins  Home Medications   Prior to Admission medications   Medication Sig Start Date End Date Taking? Authorizing Provider  acetaminophen (TYLENOL) 500 MG tablet Take 500 mg by mouth at bedtime. pain    Historical Provider, MD  aspirin 81 MG chewable tablet Chew 81 mg by mouth daily.    Historical Provider, MD  bisacodyl (DULCOLAX) 10 MG suppository Place 10 mg rectally as needed for moderate constipation (for constipation not relieved by milk of magnesium).    Historical Provider, MD  clopidogrel (PLAVIX) 75 MG tablet Take 75 mg by mouth daily.     Historical Provider, MD  Dorzolamide HCl-Timolol Mal PF 22.3-6.8 MG/ML SOLN Place 1 drop into both eyes every 12 (twelve) hours.  Historical Provider, MD  fish oil-omega-3 fatty acids 1000 MG capsule Take 1 g by mouth daily.    Historical Provider, MD  furosemide (LASIX) 40 MG tablet Take 20 mg by mouth daily.     Historical Provider, MD  latanoprost (XALATAN) 0.005 % ophthalmic solution Place 1 drop into both eyes at bedtime.    Historical Provider, MD  magnesium hydroxide (MILK OF MAGNESIA) 400 MG/5ML suspension Take 30 mLs by mouth daily as needed for mild constipation.    Historical Provider, MD  metoprolol tartrate (LOPRESSOR) 25 MG tablet Take 25 mg by mouth 2 (two) times daily.    Historical Provider, MD  Multiple Vitamins-Minerals (ICAPS) TABS Take 1 tablet by mouth daily.    Historical Provider, MD  oxyCODONE (ROXICODONE) 5 MG immediate release tablet Take 1 tablet (5  mg total) by mouth every 4 (four) hours as needed for severe pain. 04/12/14   Richarda Overlie, MD  pantoprazole (PROTONIX) 40 MG tablet Take 1 tablet (40 mg total) by mouth daily. 10/27/13   Rhonda G Barrett, PA-C  promethazine (PHENERGAN) 25 MG tablet Take 25 mg by mouth every 6 (six) hours as needed for nausea or vomiting.    Historical Provider, MD  Sodium Phosphates (RA SALINE ENEMA) 19-7 GM/118ML ENEM Place 1 enema rectally daily as needed (constipation not relieved by milk of magnesium, or bisacodyl suppository).    Historical Provider, MD  traMADol (ULTRAM) 50 MG tablet Take 1 tablet (50 mg total) by mouth every 6 (six) hours as needed. 04/12/14   Richarda Overlie, MD   There were no vitals taken for this visit. Physical Exam  Constitutional: She is oriented to person, place, and time. She appears well-developed and well-nourished. No distress.  HENT:  Head: Normocephalic and atraumatic.  Dried yellow emesis around mouth  Eyes: Conjunctivae are normal. Pupils are equal, round, and reactive to light.  Neck: Neck supple.  Cardiovascular: Normal rate, regular rhythm and normal heart sounds.   No murmur heard. Pulmonary/Chest: Effort normal. No respiratory distress. She has no rales.  Occasional faint expiratory wheeze, frequent cough  Abdominal: Soft. Bowel sounds are normal. She exhibits no distension. There is no rebound and no guarding.  LLQ and RLQ tenderness to palpation  Musculoskeletal: She exhibits no edema.  Neurological: She is alert and oriented to person, place, and time.  Fluent speech  Skin: Skin is warm and dry.  Psychiatric: She has a normal mood and affect. Judgment normal.  Nursing note and vitals reviewed.   ED Course  Procedures (including critical care time) Labs Review Labs Reviewed  COMPREHENSIVE METABOLIC PANEL - Abnormal; Notable for the following:    Glucose, Bld 146 (*)    BUN 31 (*)    Creatinine, Ser 1.82 (*)    Albumin 3.3 (*)    GFR calc non Af Amer 23 (*)     GFR calc Af Amer 26 (*)    All other components within normal limits  CBC WITH DIFFERENTIAL/PLATELET - Abnormal; Notable for the following:    WBC 11.2 (*)    Neutro Abs 9.3 (*)    All other components within normal limits  URINALYSIS, ROUTINE W REFLEX MICROSCOPIC (NOT AT [facility]) - Abnormal; Notable for the following:    APPearance CLOUDY (*)    Hgb urine dipstick SMALL (*)    Protein, ur 30 (*)    Leukocytes, UA SMALL (*)    All other components within normal limits  URINE MICROSCOPIC-ADD ON - Abnormal; Notable for the  following:    Squamous Epithelial / LPF 0-5 (*)    Bacteria, UA MANY (*)    All other components within normal limits  URINE CULTURE  MRSA PCR SCREENING  LIPASE, BLOOD  BASIC METABOLIC PANEL  CBC    Imaging Review Ct Abdomen Pelvis Wo Contrast  08/24/2015  CLINICAL DATA:  81 year old female with subacute intermittent abdominal and pelvic pain. EXAM: CT ABDOMEN AND PELVIS WITHOUT CONTRAST TECHNIQUE: Multidetector CT imaging of the abdomen and pelvis was performed following the standard protocol without IV contrast. COMPARISON:  10/18/2010 CT FINDINGS: Please note that parenchymal abnormalities may be missed without intravenous contrast. Lower chest: Cardiomegaly and 3.5 cm descending thoracic aortic aneurysm again identified. Hepatobiliary: The liver is unremarkable. The patient is status post cholecystectomy. There is no evidence of biliary dilatation. Pancreas: Unremarkable Spleen: Unremarkable Adrenals/Urinary Tract: Bilateral renal atrophy and probable bilateral renal cysts again identified. The adrenal glands are unremarkable. A left bladder diverticulum is again noted. Stomach/Bowel: There is no evidence of bowel obstruction or definite bowel wall thickening. Colonic diverticulosis noted without evidence of diverticulitis. Vascular/Lymphatic: No enlarged lymph nodes. A 4.1 cm infrarenal abdominal aortic aneurysm is identified, previously measuring 3.7 cm in 2012.  There is no evidence of retro peritoneal hematoma or periaortic stranding. Reproductive: Prostate enlargement noted. Other: No free fluid, focal collection or pneumoperitoneum. Musculoskeletal: Right total hip arthroplasty obscures some detail on the pelvis. No acute or suspicious abnormalities. Mild degenerative changes within the lumbar spine identified. IMPRESSION: No evidence of acute abnormality. Cardiomegaly and thoracic and aortic aneurysms as described, with maximal diameter of 3.5 cm of the descending thoracic aorta and 4.1 cm of the infrarenal abdominal aorta. Recommend followup by ultrasound in 1 year as clinically indicated. Colonic diverticulosis without diverticulitis. Electronically Signed   By: Harmon Pier M.D.   On: 08/24/2015 13:24   Dg Chest 2 View  08/24/2015  CLINICAL DATA:  Cough.  Vomiting. EXAM: CHEST  2 VIEW COMPARISON:  04/10/2014 FINDINGS: Heart size and pulmonary vascularity are normal. There is extensive calcification as well as tortuosity of the thoracic aorta. Lungs are clear. No acute osseous abnormality. Chronic accentuation of the thoracic kyphosis. IMPRESSION: No acute abnormality.  Aortic atherosclerosis. Electronically Signed   By: Francene Boyers M.D.   On: 08/24/2015 09:14   I have personally reviewed and evaluated these lab results as part of my medical decision-making.   EKG Interpretation None     Medications - No data to display  MDM   Final diagnoses:  Urinary tract infection in elderly patient  AKI (acute kidney injury) (HCC)  Non-intractable vomiting with nausea, vomiting of unspecified type  Cough    Pt presents with vomiting that started overnight last night without associated diarrhea. She has had recent cough symptoms as well. On arrival, the patient was awake, alert, and in no acute distress. She had yellow emesis dried around her mouth. O2 sat 93-96% on room air, normal work of breathing. Left lower quadrant and right lower quadrant tenderness  to palpation without rebound or guarding. Obtained above lab work as well as chest x-ray given cough and slightly abnormal O2 saturation. Because of the patient's abdominal tenderness, also obtained CT of abdomen and pelvis to evaluate for obstruction. Chest x-ray and CT negative acute. Labs notable for creatinine of 1.82 suggestive of acute on chronic kidney injury, likely from dehydration. UA containing leukocytes and bacteria, urine culture sent. Give the patient an IV fluid bolus and ceftriaxone to treat UTI. Because of the  persistent cough and mild hypoxia, also gave Levaquin to treat community-acquired pneumonia. Given her worsening kidney function and infection, discussed admission with family who is in agreement. Discussed with [facility] hospitalist, Dr. Jerral RalphGhimire, and pt admitted for further care.   Laurence Spatesachel Morgan Larrie Fraizer, MD 08/24/15 [id number]

## 2015-08-24 NOTE — H&P (Signed)
PATIENT DETAILS Name: [name] CocoLena B Durkee Age: 81 y.o. Sex: female Date of Birth: 13-Nov-1918 Admit Date: 08/24/2015 ONG:[id number]:ELKINS,WILSON OLIVER, MD Referring Physician:Dr Little   CHIEF COMPLAINT:  Vomiting.  HPI: [name] [name] is a 81 y.o. female with a Past Medical History of hypertension, CAD, glaucoma who presents today with the above noted complaint. Patient is a resident of a local skilled nursing facility. She was in her usual state of health until this morning when she woke up and started having numerous episodes of non-bloody emesis. She denied any abdominal pain, or fever. There is no history of diarrhea. She was subsequently brought to the emergency room for further evaluation, and was found to have mild acute renal failure. I was subsequently asked to admit this patient for further evaluation and treatment. While in the emergency room, patient has had no further episodes of emesis. She continues to deny any abdominal pain. There is no history of chest pain, shortness of breath, fever.   ALLERGIES:   Allergies  Allergen Reactions  . Codeine Nausea And Vomiting  . Morphine And Related Nausea And Vomiting  . Penicillins Hives    PAST MEDICAL HISTORY: Past Medical History  Diagnosis Date  . Hypertension   . Cataract     both eyes  . Osteoarthritis   . Glaucoma   . Poor circulation   . CAD (coronary artery disease)     a. 02/2001 PTCA & BMS to the OM1;  b. 08/2001 CBA to ISR in OM1, CBA of mid LAD;  c. 02/2002 Failed PCI of mLAD occlusion, LCX nl, patent OM1 stent, RCA dom, 40p, PDA/PL nl, EF 50% mild antlat HK.  Marland Kitchen. Thyroid disease     hypothyroidism, hypothyroidism  . Hypertriglyceridemia     PAST SURGICAL HISTORY: Past Surgical History  Procedure Laterality Date  . Abdominal hysterectomy  1972  . Eye surgery  2007/2008    cataracts  . Cardiac surgery  2001    stent placement  . Hernia repair  2009  . Gallbladder surgery  2009  . Hip arthroplasty   06/06/2012    Procedure: ARTHROPLASTY BIPOLAR HIP;  Surgeon: Eldred MangesMark C Yates, MD;  Location: [facility];  Service: Orthopedics;  Laterality: Right;  monopolar    MEDICATIONS AT HOME: Prior to Admission medications   Medication Sig Start Date End Date Taking? Authorizing Provider  acetaminophen (TYLENOL) 500 MG tablet Take 500 mg by mouth at bedtime. pain   Yes Historical Provider, MD  aspirin 81 MG chewable tablet Chew 81 mg by mouth daily.   Yes Historical Provider, MD  clopidogrel (PLAVIX) 75 MG tablet Take 75 mg by mouth daily.    Yes Historical Provider, MD  Dorzolamide HCl-Timolol Mal PF 22.3-6.8 MG/ML SOLN Place 1 drop into both eyes every 12 (twelve) hours.    Yes Historical Provider, MD  fish oil-omega-3 fatty acids 1000 MG capsule Take 1 g by mouth daily.   Yes Historical Provider, MD  furosemide (LASIX) 20 MG tablet Take 20 mg by mouth daily.   Yes Historical Provider, MD  latanoprost (XALATAN) 0.005 % ophthalmic solution Place 1 drop into both eyes at bedtime.   Yes Historical Provider, MD  magnesium hydroxide (MILK OF MAGNESIA) 400 MG/5ML suspension Take 30 mLs by mouth daily as needed for mild constipation.   Yes Historical Provider, MD  metoprolol tartrate (LOPRESSOR) 25 MG tablet Take 25 mg by mouth 2 (two) times daily.   Yes Historical Provider, MD  Multiple Vitamins-Minerals (ICAPS) TABS Take 1 tablet by mouth daily.   Yes Historical Provider, MD  oxyCODONE (ROXICODONE) 5 MG immediate release tablet Take 1 tablet (5 mg total) by mouth every 4 (four) hours as needed for severe pain. 04/12/14  Yes Richarda Overlie, MD  pantoprazole (PROTONIX) 40 MG tablet Take 1 tablet (40 mg total) by mouth daily. 10/27/13  Yes Rhonda G Barrett, PA-C  promethazine (PHENERGAN) 25 MG tablet Take 25 mg by mouth every 6 (six) hours as needed for nausea or vomiting.   Yes Historical Provider, MD  traMADol (ULTRAM) 50 MG tablet Take 1 tablet (50 mg total) by mouth every 6 (six) hours as needed. 04/12/14  Yes Richarda Overlie, MD    FAMILY HISTORY: Family History  Problem Relation Age of Onset  . Arthritis Father   . Stroke Father   . Hypertension Father   . Heart disease Sister   . Heart disease Brother   . Hypertension Brother   . Stroke Brother     SOCIAL HISTORY:  reports that she has never smoked. She has never used smokeless tobacco. She reports that she does not drink alcohol or use illicit drugs. Lives at: SNF Mobility: Ambulates with a walker-but also uses a wheelchair.   REVIEW OF SYSTEMS:  Constitutional:   No  weight loss, night sweats,  Fevers, chills, fatigue.  HEENT:    No headaches, Dysphagia,Tooth/dental problems,Sore throat,   Cardio-vascular: No chest pain,Orthopnea, PND,lower extremity edema, anasarca, palpitations  GI:  No heartburn, indigestion, abdominal pain,  diarrhea, melena or hematochezia  Resp: No shortness of breath, cough, hemoptysis,plueritic chest pain.   Skin:  No rash or lesions.  GU:  No dysuria, change in color of urine, no urgency or frequency.  No flank pain.  Musculoskeletal: No joint pain or swelling.  No decreased range of motion.  No back pain.  Endocrine: No heat intolerance, no cold intolerance, no polyuria, no polydipsia  Psych: No change in mood or affect. No depression or anxiety.  No memory loss.   PHYSICAL EXAM: Blood pressure 137/58, pulse 70, temperature 97.5 F (36.4 C), temperature source Oral, resp. rate 16, SpO2 96 %.   HEENT: Atraumatic and Normocephalic, pupils equally reactive to light and accomodation Neck: supple, no JVD. No cervical lymphadenopathy.  Chest:Good air entry bilaterally, no added sounds  CVS: S1 S2 regular Abdomen: Bowel sounds present, Non tender and not distended with no gaurding, rigidity or rebound. Extremities: B/L Lower Ext shows no edema, both legs are warm to touch Neurology:  Non focal Skin:No Rash Wounds:N/A  LABS ON ADMISSION:   Recent Labs  08/24/15 0938  NA 140  K 4.1  CL  101  CO2 27  GLUCOSE 146*  BUN 31*  CREATININE 1.82*  CALCIUM 9.6    Recent Labs  08/24/15 0938  AST 27  ALT 18  ALKPHOS 86  BILITOT 0.5  PROT 7.9  ALBUMIN 3.3*    Recent Labs  08/24/15 0938  LIPASE 40    Recent Labs  08/24/15 0938  WBC 11.2*  NEUTROABS 9.3*  HGB 12.3  HCT 38.6  MCV 95.8  PLT 253   No results for input(s): CKTOTAL, CKMB, CKMBINDEX, TROPONINI in the last 72 hours. No results for input(s): DDIMER in the last 72 hours. Invalid input(s): POCBNP   RADIOLOGIC STUDIES ON ADMISSION: Ct Abdomen Pelvis Wo Contrast  08/24/2015  CLINICAL DATA:  81 year old female with subacute intermittent abdominal and pelvic pain. EXAM: CT ABDOMEN AND PELVIS WITHOUT CONTRAST  TECHNIQUE: Multidetector CT imaging of the abdomen and pelvis was performed following the standard protocol without IV contrast. COMPARISON:  10/18/2010 CT FINDINGS: Please note that parenchymal abnormalities may be missed without intravenous contrast. Lower chest: Cardiomegaly and 3.5 cm descending thoracic aortic aneurysm again identified. Hepatobiliary: The liver is unremarkable. The patient is status post cholecystectomy. There is no evidence of biliary dilatation. Pancreas: Unremarkable Spleen: Unremarkable Adrenals/Urinary Tract: Bilateral renal atrophy and probable bilateral renal cysts again identified. The adrenal glands are unremarkable. A left bladder diverticulum is again noted. Stomach/Bowel: There is no evidence of bowel obstruction or definite bowel wall thickening. Colonic diverticulosis noted without evidence of diverticulitis. Vascular/Lymphatic: No enlarged lymph nodes. A 4.1 cm infrarenal abdominal aortic aneurysm is identified, previously measuring 3.7 cm in 2012. There is no evidence of retro peritoneal hematoma or periaortic stranding. Reproductive: Prostate enlargement noted. Other: No free fluid, focal collection or pneumoperitoneum. Musculoskeletal: Right total hip arthroplasty obscures  some detail on the pelvis. No acute or suspicious abnormalities. Mild degenerative changes within the lumbar spine identified. IMPRESSION: No evidence of acute abnormality. Cardiomegaly and thoracic and aortic aneurysms as described, with maximal diameter of 3.5 cm of the descending thoracic aorta and 4.1 cm of the infrarenal abdominal aorta. Recommend followup by ultrasound in 1 year as clinically indicated. Colonic diverticulosis without diverticulitis. Electronically Signed   By: Harmon Pier M.D.   On: 08/24/2015 13:24   Dg Chest 2 View  08/24/2015  CLINICAL DATA:  Cough.  Vomiting. EXAM: CHEST  2 VIEW COMPARISON:  04/10/2014 FINDINGS: Heart size and pulmonary vascularity are normal. There is extensive calcification as well as tortuosity of the thoracic aorta. Lungs are clear. No acute osseous abnormality. Chronic accentuation of the thoracic kyphosis. IMPRESSION: No acute abnormality.  Aortic atherosclerosis. Electronically Signed   By: Francene Boyers M.D.   On: 08/24/2015 09:14    ASSESSMENT AND PLAN: Present on Admission:  . Vomiting: Seems to have significantly improved in the emergency room, suspect a viral syndrome or secondary to UTI. Abdomen is benign on exam, Lipase is normal.CT abdomen is negative for acute abnormalities. Supportive care, gently hydrate-start clear liquids. Follow clinical course   . ARF (acute renal failure): Likely prerenal azotemia in a setting of dehydration/vomiting. Gently hydrate, recheck electrolytes in a.m.  . UTI (lower urinary tract infection): UA somewhat suggestive of UTI, start Rocephin, follow cultures.   Marland Kitchen CAD (coronary artery disease): Without chest pain or shortness of breath, continue antiplatelets and metoprolol.   . Essential hypertension, benign: Continue metoprolol, follow.  . History of glaucoma: Continue usual eyedrops.  Further plan will depend as patient's clinical course evolves and further radiologic and laboratory data become available.  Patient will be monitored closely.  Above noted plan was discussed with patient/son/daughter face to face at bedside, they were in agreement.   CONSULTS: None  DVT Prophylaxis: Prophylactic Heparin  Code Status: DNR  Disposition Plan:  Discharge back SNF possibly in 1-2 days   Total time spent  55 minutes.Greater than 50% of this time was spent in counseling, explanation of diagnosis, planning of further management, and coordination of care.  [facility] Pager [phone]  If 7PM-7AM, please contact night-coverage [email or web address] Password [email or web address] 08/24/2015, 3:24 PM

## 2015-08-24 NOTE — ED Notes (Signed)
Patient returned from CT

## 2015-08-24 NOTE — ED Notes (Signed)
Attempted report 

## 2015-08-24 NOTE — ED Notes (Signed)
Patient transported to X-ray 

## 2015-08-24 NOTE — ED Notes (Signed)
Not feeling well yesterday; today 5-6 episodes of vomiting yellow emesis. Denies any abdominal pain. Denies diarrhea. Denies fevers/chills.

## 2015-08-25 LAB — CBC
HEMATOCRIT: 34.8 % — AB (ref 36.0–46.0)
HEMOGLOBIN: 10.9 g/dL — AB (ref 12.0–15.0)
MCH: 30.3 pg (ref 26.0–34.0)
MCHC: 31.3 g/dL (ref 30.0–36.0)
MCV: 96.7 fL (ref 78.0–100.0)
Platelets: 225 10*3/uL (ref 150–400)
RBC: 3.6 MIL/uL — ABNORMAL LOW (ref 3.87–5.11)
RDW: 13.1 % (ref 11.5–15.5)
WBC: 8.2 10*3/uL (ref 4.0–10.5)

## 2015-08-25 LAB — BASIC METABOLIC PANEL
ANION GAP: 10 (ref 5–15)
BUN: 26 mg/dL — ABNORMAL HIGH (ref 6–20)
CALCIUM: 9 mg/dL (ref 8.9–10.3)
CO2: 27 mmol/L (ref 22–32)
Chloride: 104 mmol/L (ref 101–111)
Creatinine, Ser: 1.48 mg/dL — ABNORMAL HIGH (ref 0.44–1.00)
GFR, EST AFRICAN AMERICAN: 34 mL/min — AB (ref >60)
GFR, EST NON AFRICAN AMERICAN: 29 mL/min — AB (ref >60)
Glucose, Bld: 90 mg/dL (ref 65–99)
POTASSIUM: 4.6 mmol/L (ref 3.5–5.1)
SODIUM: 141 mmol/L (ref 135–145)

## 2015-08-25 NOTE — Progress Notes (Signed)
Pt is long term resident at [facility]- facility is able to accept pt back when stable for DC  CSW will continue to follow  Merlyn LotJenna Holoman, [facility] IncCSWA Clinical Social Worker [phone]

## 2015-08-25 NOTE — Progress Notes (Signed)
PROGRESS NOTE  [name] [name] UJW:[id number] CocoRN:[id number] DOB: 08/19/1919 DOA: 08/24/2015 PCP: Kaleen MaskELKINS,WILSON OLIVER, MD  Assessment/Plan: Vomiting: resolved, advance diet  ARF (acute renal failure): IVF  UTI (lower urinary tract infection): UA somewhat suggestive of UTI, start Rocephin, follow cultures.   CAD (coronary artery disease): Without chest pain or shortness of breath, continue antiplatelets and metoprolol.   Essential hypertension, benign: Continue metoprolol, follow.  History of glaucoma: Continue usual eyedrops.  Code Status: DNR Family Communication: called both daughters, no answer Disposition Plan: back to sNF Sunday/monday   Consultants:    Procedures:     HPI/Subjective: No complaints, eating well  Objective: Filed Vitals:   08/25/15 0445 08/25/15 1038  BP: 145/68 149/49  Pulse: 72 67  Temp: 98.7 F (37.1 C)   Resp: 18     Intake/Output Summary (Last 24 hours) at 08/25/15 1304 Last data filed at 08/25/15 0300  Gross per 24 hour  Intake 491.25 ml  Output      0 ml  Net 491.25 ml   Filed Weights   08/24/15 1600  Weight: 77.429 kg (170 lb 11.2 oz)    Exam:   General:  Awake, pleasant  Cardiovascular: rrr  Respiratory: clear  Abdomen: +BS, soft  Musculoskeletal: no edema   Data Reviewed: Basic Metabolic Panel:  Recent Labs Lab 08/24/15 0938 08/25/15 0320  NA 140 141  K 4.1 4.6  CL 101 104  CO2 27 27  GLUCOSE 146* 90  BUN 31* 26*  CREATININE 1.82* 1.48*  CALCIUM 9.6 9.0   Liver Function Tests:  Recent Labs Lab 08/24/15 0938  AST 27  ALT 18  ALKPHOS 86  BILITOT 0.5  PROT 7.9  ALBUMIN 3.3*    Recent Labs Lab 08/24/15 0938  LIPASE 40   No results for input(s): AMMONIA in the last 168 hours. CBC:  Recent Labs Lab 08/24/15 0938 08/25/15 0320  WBC 11.2* 8.2  NEUTROABS 9.3*  --   HGB 12.3 10.9*  HCT 38.6 34.8*  MCV 95.8 96.7  PLT 253 225   Cardiac Enzymes: No results for input(s): CKTOTAL, CKMB, CKMBINDEX,  TROPONINI in the last 168 hours. BNP (last 3 results) No results for input(s): BNP in the last 8760 hours.  ProBNP (last 3 results) No results for input(s): PROBNP in the last 8760 hours.  CBG: No results for input(s): GLUCAP in the last 168 hours.  Recent Results (from the past 240 hour(s))  MRSA PCR Screening     Status: Abnormal   Collection Time: 08/24/15  4:23 PM  Result Value Ref Range Status   MRSA by PCR POSITIVE (A) NEGATIVE Final    Comment:        The GeneXpert MRSA Assay (FDA approved for NASAL specimens only), is one component of a comprehensive MRSA colonization surveillance program. It is not intended to diagnose MRSA infection nor to guide or monitor treatment for MRSA infections. RESULT CALLED TO, READ BACK BY AND VERIFIED WITH: KUFFOUR RN 17:55 08/24/15 ([email or web address])      Studies: Ct Abdomen Pelvis Wo Contrast  08/24/2015  CLINICAL DATA:  81 year old female with subacute intermittent abdominal and pelvic pain. EXAM: CT ABDOMEN AND PELVIS WITHOUT CONTRAST TECHNIQUE: Multidetector CT imaging of the abdomen and pelvis was performed following the standard protocol without IV contrast. COMPARISON:  10/18/2010 CT FINDINGS: Please note that parenchymal abnormalities may be missed without intravenous contrast. Lower chest: Cardiomegaly and 3.5 cm descending thoracic aortic aneurysm again identified. Hepatobiliary: The liver is unremarkable. The patient is status post  cholecystectomy. There is no evidence of biliary dilatation. Pancreas: Unremarkable Spleen: Unremarkable Adrenals/Urinary Tract: Bilateral renal atrophy and probable bilateral renal cysts again identified. The adrenal glands are unremarkable. A left bladder diverticulum is again noted. Stomach/Bowel: There is no evidence of bowel obstruction or definite bowel wall thickening. Colonic diverticulosis noted without evidence of diverticulitis. Vascular/Lymphatic: No enlarged lymph nodes. A 4.1 cm infrarenal abdominal  aortic aneurysm is identified, previously measuring 3.7 cm in 2012. There is no evidence of retro peritoneal hematoma or periaortic stranding. Reproductive: Prostate enlargement noted. Other: No free fluid, focal collection or pneumoperitoneum. Musculoskeletal: Right total hip arthroplasty obscures some detail on the pelvis. No acute or suspicious abnormalities. Mild degenerative changes within the lumbar spine identified. IMPRESSION: No evidence of acute abnormality. Cardiomegaly and thoracic and aortic aneurysms as described, with maximal diameter of 3.5 cm of the descending thoracic aorta and 4.1 cm of the infrarenal abdominal aorta. Recommend followup by ultrasound in 1 year as clinically indicated. Colonic diverticulosis without diverticulitis. Electronically Signed   By: Harmon Pier M.D.   On: 08/24/2015 13:24   Dg Chest 2 View  08/24/2015  CLINICAL DATA:  Cough.  Vomiting. EXAM: CHEST  2 VIEW COMPARISON:  04/10/2014 FINDINGS: Heart size and pulmonary vascularity are normal. There is extensive calcification as well as tortuosity of the thoracic aorta. Lungs are clear. No acute osseous abnormality. Chronic accentuation of the thoracic kyphosis. IMPRESSION: No acute abnormality.  Aortic atherosclerosis. Electronically Signed   By: Francene Boyers M.D.   On: 08/24/2015 09:14    Scheduled Meds: . aspirin  81 mg Oral Daily  . cefTRIAXone (ROCEPHIN)  IV  1 g Intravenous Q24H  . clopidogrel  75 mg Oral Daily  . dorzolamide-timolol  1 drop Both Eyes Q12H  . heparin  5,000 Units Subcutaneous 3 times per day  . latanoprost  1 drop Both Eyes QHS  . metoprolol tartrate  25 mg Oral BID  . multivitamin with minerals  1 tablet Oral Daily  . omega-3 acid ethyl esters  1 g Oral Daily  . pantoprazole  40 mg Oral Daily  . sodium chloride  3 mL Intravenous Q12H   Continuous Infusions: . sodium chloride 75 mL/hr at 08/24/15 1619   Antibiotics Given (last 72 hours)    Date/Time Action Medication Dose Rate    08/24/15 1444 Given   levofloxacin (LEVAQUIN) tablet 750 mg 750 mg    08/24/15 1742 Given   cefTRIAXone (ROCEPHIN) 1 g in dextrose 5 % 50 mL IVPB 1 g 100 mL/hr      Principal Problem:   ARF (acute renal failure) (HCC) Active Problems:   CAD (coronary artery disease)   Essential hypertension, benign   UTI (lower urinary tract infection)    Time spent: 25 min    JESSICA U [facility]  [facility] Pager [phone]. If 7PM-7AM, please contact night-coverage at [email or web address], password [facility] 08/25/2015, 1:04 PM  LOS: 1 day

## 2015-08-25 NOTE — Evaluation (Signed)
Physical Therapy Evaluation Patient Details Name: [name] CocoLena B Pinheiro MRN: [id number] DOB: 1918-11-09 Today's Date: 08/25/2015   History of Present Illness  [name] [name] is a 81 y.o. female with a Past Medical History of hypertension, CAD, glaucoma who presents today with the above noted complaint. Patient is a resident of a local skilled nursing facility. She was in her usual state of health until this morning when she woke up and started having numerous episodes of non-bloody emesis. She denied any abdominal pain, or fever. There is no history of diarrhea. She was subsequently brought to the emergency room for further evaluation, and was found to have mild acute renal failure. I was subsequently asked to admit this patient for further evaluation and treatment.  Clinical Impression  Pt admitted with above diagnosis. Pt currently with functional limitations due to the deficits listed below (see PT Problem List). Pt should progress well and return to SNF with therapy.  Will follow acutely.  Pt will benefit from skilled PT to increase their independence and safety with mobility to allow discharge to the venue listed below.      Follow Up Recommendations SNF;Supervision/Assistance - 24 hour    Equipment Recommendations  Other (comment) (TBA)    Recommendations for Other Services       Precautions / Restrictions Precautions Precautions: Fall Restrictions Weight Bearing Restrictions: No      Mobility  Bed Mobility Overal bed mobility: Needs Assistance Bed Mobility: Supine to Sit     Supine to sit: Min guard     General bed mobility comments: no assist needed.  Transfers Overall transfer level: Needs assistance Equipment used: Rolling walker (2 wheeled) Transfers: Sit to/from Stand Sit to Stand: Min guard         General transfer comment: Did not need assist but needed cues for hand placement.    Ambulation/Gait Ambulation/Gait assistance: Min assist;Min guard;+2  safety/equipment Ambulation Distance (Feet): 45 Feet Assistive device: Rolling walker (2 wheeled) Gait Pattern/deviations: Step-through pattern;Decreased stride length;Trunk flexed   Gait velocity interpretation: Below normal speed for age/gender General Gait Details: Pt was able to ambulate around the bed to the bathroom. Used bathroom, cleaned self with min assist,  and then ambulated back to recliner. Slightly flexed posture and cues for safe technique with RW.   Stairs            Wheelchair Mobility    Modified Rankin (Stroke Patients Only)       Balance Overall balance assessment: Needs assistance;History of Falls Sitting-balance support: No upper extremity supported;Feet supported Sitting balance-Leahy Scale: Fair     Standing balance support: Bilateral upper extremity supported;During functional activity Standing balance-Leahy Scale: Poor Standing balance comment: requires UE support for balance.                               Pertinent Vitals/Pain Pain Assessment: No/denies pain  VSS    Home Living Family/patient expects to be discharged to:: Skilled nursing facility   Available Help at Discharge: Skilled Nursing Facility Type of Home: Skilled Nursing Facility         Home Equipment: Dan HumphreysWalker - 4 wheels;Wheelchair - manual      Prior Function Level of Independence: Independent with assistive device(s);Needs assistance   Gait / Transfers Assistance Needed: used rollator for ambulation Modif I   ADL's / Homemaking Assistance Needed: reports she bathed and dressed herself        Hand Dominance  Dominant Hand: Right    Extremity/Trunk Assessment   Upper Extremity Assessment: Defer to OT evaluation           Lower Extremity Assessment: Generalized weakness      Cervical / Trunk Assessment: Kyphotic  Communication   Communication: No difficulties  Cognition Arousal/Alertness: Awake/alert Behavior During Therapy: WFL for tasks  assessed/performed Overall Cognitive Status: Within Functional Limits for tasks assessed                      General Comments      Exercises General Exercises - Lower Extremity Ankle Circles/Pumps: AROM;Both;5 reps;Supine Long Arc Quad: AROM;Both;10 reps;Seated      Assessment/Plan    PT Assessment Patient needs continued PT services  PT Diagnosis Generalized weakness   PT Problem List Decreased activity tolerance;Decreased balance;Decreased mobility;Decreased knowledge of use of DME;Decreased safety awareness;Decreased knowledge of precautions;Decreased strength  PT Treatment Interventions DME instruction;Gait training;Functional mobility training;Therapeutic exercise;Balance training;Therapeutic activities;Patient/family education   PT Goals (Current goals can be found in the Care Plan section) Acute Rehab PT Goals Patient Stated Goal: to get better PT Goal Formulation: With patient Time For Goal Achievement: 09/08/15 Potential to Achieve Goals: Good    Frequency Min 2X/week   Barriers to discharge Decreased caregiver support      Co-evaluation               End of Session Equipment Utilized During Treatment: Gait belt Activity Tolerance: Patient limited by fatigue Patient left: in chair;with call bell/phone within reach;with chair alarm set Nurse Communication: Mobility status         Time: 0842-0906 PT Time Calculation (min) (ACUTE ONLY): 24 min   Charges:   PT Evaluation $Initial PT Evaluation Tier I: 1 Procedure PT Treatments $Gait Training: 8-22 mins   PT G CodesBerline Lopes September 18, 2015, 10:45 AM Floy Riegler,PT Acute Rehabilitation [phone] (pager)

## 2015-08-25 NOTE — [place] FL2 (Signed)
MEDICAID FL2 LEVEL OF CARE SCREENING TOOL     IDENTIFICATION  Patient Name: [name] [name] Birthdate: 1918/12/29 Sex: female Admission Date (Current Location): 08/24/2015  [facility] [place]   Facility and Address:  [facility] [place], [address], [place], [place] [address]      Provider Number: [id number]  Attending Physician Name and Address:  Joseph ArtJessica U Vann, DO  Relative Name and Phone Number:       Current Level of Care: Hospital Recommended Level of Care: Skilled Nursing Facility Prior Approval Number:    Date Approved/Denied:   PASRR Number:    Discharge Plan: SNF    Current Diagnoses: Patient Active Problem List   Diagnosis Date Noted  . UTI (lower urinary tract infection) 08/24/2015  . ARF (acute renal failure) (HCC) 08/24/2015  . Cough 09/24/2014  . Pain in joint, ankle and foot 04/15/2014  . Cellulitis 04/10/2014  . Pure hyperglyceridemia 12/22/2013  . Renal insufficiency 11/06/2013  . Chest pain 10/26/2013  . Epigastric pain 10/26/2013  . OA (osteoarthritis) 10/03/2013  . Edema 03/03/2013  . Essential hypertension, benign 01/05/2013  . Anemia 01/05/2013  . Glaucoma 06/06/2012  . CAD (coronary artery disease) 06/05/2012    Orientation RESPIRATION BLADDER Height & Weight    Self, Time, Situation, Place  Normal Incontinent 5\' 3"  (160 cm) 170 lbs.  BEHAVIORAL SYMPTOMS/MOOD NEUROLOGICAL BOWEL NUTRITION STATUS      Continent Diet  AMBULATORY STATUS COMMUNICATION OF NEEDS Skin   Limited Assist Verbally Normal                       Personal Care Assistance Level of Assistance    Bathing Assistance: Limited assistance   Dressing Assistance: Limited assistance     Functional Limitations Info             SPECIAL CARE FACTORS FREQUENCY                       Contractures      Additional Factors Info  Code Status, Allergies Code Status Info: DNR Allergies Info: Codeine,  Morphine And Related, Penicillins           Current Medications (08/25/2015):  This is the current hospital active medication list Current Facility-Administered Medications  Medication Dose Route Frequency Provider Last Rate Last Dose  . 0.9 %  sodium chloride infusion   Intravenous Continuous Maretta BeesShanker M Ghimire, MD 75 mL/hr at 08/24/15 1619    . acetaminophen (TYLENOL) tablet 650 mg  650 mg Oral Q6H PRN Maretta BeesShanker M Ghimire, MD       Or  . acetaminophen (TYLENOL) suppository 650 mg  650 mg Rectal Q6H PRN Shanker Levora DredgeM Ghimire, MD      . albuterol (PROVENTIL) (2.5 MG/3ML) 0.083% nebulizer solution 2.5 mg  2.5 mg Nebulization Q2H PRN Maretta BeesShanker M Ghimire, MD      . aspirin chewable tablet 81 mg  81 mg Oral Daily Maretta BeesShanker M Ghimire, MD   81 mg at 08/25/15 1040  . cefTRIAXone (ROCEPHIN) 1 g in dextrose 5 % 50 mL IVPB  1 g Intravenous Q24H Maretta BeesShanker M Ghimire, MD   1 g at 08/24/15 1742  . clopidogrel (PLAVIX) tablet 75 mg  75 mg Oral Daily Maretta BeesShanker M Ghimire, MD   75 mg at 08/25/15 1040  . dorzolamide-timolol (COSOPT) 22.3-6.8 MG/ML ophthalmic solution 1 drop  1 drop Both Eyes Q12H Shanker Levora DredgeM Ghimire, MD  1 drop at 08/25/15 1610  . guaiFENesin-dextromethorphan (ROBITUSSIN DM) 100-10 MG/5ML syrup 5 mL  5 mL Oral Q4H PRN Maretta Bees, MD      . heparin injection 5,000 Units  5,000 Units Subcutaneous 3 times per day Maretta Bees, MD   5,000 Units at 08/25/15 [phone]  . latanoprost (XALATAN) 0.005 % ophthalmic solution 1 drop  1 drop Both Eyes QHS Maretta Bees, MD   1 drop at 08/24/15 2257  . metoprolol tartrate (LOPRESSOR) tablet 25 mg  25 mg Oral BID Maretta Bees, MD   25 mg at 08/25/15 1040  . multivitamin with minerals tablet 1 tablet  1 tablet Oral Daily Remi Haggard, [facility]   1 tablet at 08/25/15 1040  . omega-3 acid ethyl esters (LOVAZA) capsule 1 g  1 g Oral Daily Remi Haggard, RPH   1 g at 08/25/15 1040  . ondansetron (ZOFRAN) tablet 4 mg  4 mg Oral Q6H PRN Maretta Bees, MD        Or  . ondansetron [facility]) injection 4 mg  4 mg Intravenous Q6H PRN Maretta Bees, MD   4 mg at 08/24/15 1742  . oxyCODONE (Oxy IR/ROXICODONE) immediate release tablet 5 mg  5 mg Oral Q4H PRN Maretta Bees, MD      . pantoprazole (PROTONIX) EC tablet 40 mg  40 mg Oral Daily Maretta Bees, MD   40 mg at 08/25/15 1040  . sodium chloride 0.9 % injection 3 mL  3 mL Intravenous Q12H Shanker Levora Dredge, MD         Discharge Medications: Please see discharge summary for a list of discharge medications.  Relevant Imaging Results:  Relevant Lab Results:   Additional Information SS#: [id number]  Izora Ribas, [place]

## 2015-08-26 LAB — BASIC METABOLIC PANEL
Anion gap: 8 (ref 5–15)
BUN: 16 mg/dL (ref 6–20)
CALCIUM: 8.9 mg/dL (ref 8.9–10.3)
CO2: 27 mmol/L (ref 22–32)
CREATININE: 1.18 mg/dL — AB (ref 0.44–1.00)
Chloride: 101 mmol/L (ref 101–111)
GFR, EST AFRICAN AMERICAN: 44 mL/min — AB (ref >60)
GFR, EST NON AFRICAN AMERICAN: 38 mL/min — AB (ref >60)
Glucose, Bld: 117 mg/dL — ABNORMAL HIGH (ref 65–99)
Potassium: 4 mmol/L (ref 3.5–5.1)
SODIUM: 136 mmol/L (ref 135–145)

## 2015-08-26 MED ORDER — LIDOCAINE HCL (PF) 1 % IJ SOLN
INTRAMUSCULAR | Status: AC
  Filled 2015-08-26: qty 10

## 2015-08-26 NOTE — Clinical Social Work Note (Signed)
Clinical Social Work Assessment  Patient Details  Name: [name] [name] MRN: [id number] Date of Birth: 01-19-1919  Date of referral:  08/26/15               Reason for consult:  Facility Placement                Permission sought to share information with:  Family Supports Permission granted to share information::   (UTA)  Name::        Agency::     Relationship::     Contact Information:     Housing/Transportation Living arrangements for the past 2 months:  Skilled Nursing Facility Pharmacist, community([facility]) Source of Information:  Adult Children Patient Interpreter Needed:  None Criminal Activity/Legal Involvement Pertinent to Current Situation/Hospitalization:  No - Comment as needed Significant Relationships:  Adult Children Lives with:  Facility Resident Do you feel safe going back to the place where you live?  Yes Need for family participation in patient care:  Yes (Comment)  Care giving concerns: CSW spoke with patient's daughter Avon Gully(HCPOA) and son, who agree for patient to return to SNF.    Social Worker assessment / plan: Coordinate return to [name].  Employment status:  Retired Health and safety inspectornsurance information:    PT Recommendations:  Skilled Teacher, early years/preursing Facility Information / Referral to community resources:  Skilled Nursing Facility  Patient/Family's Response to care: Identify that patient is getting good care. Concerned about management of patient's incontinence.   Patient/Family's Understanding of and Emotional Response to Diagnosis, Current Treatment, and Prognosis: Family understand that patient is getting older and there are some challenges that require greater support, but these challenges have been expected and are not distressing.   Emotional Assessment Appearance:  Appears stated age Attitude/Demeanor/Rapport:   (UTA; sleeping) Affect (typically observed):    Orientation:    Alcohol / Substance use:  Not Applicable Psych involvement (Current and /or in the community):  No  (Comment)  Discharge Needs  Concerns to be addressed:  No discharge needs identified Readmission within the last 30 days:    Current discharge risk:  None Barriers to Discharge:  No Barriers Identified   Beverly SessionsLINDSEY, Coco Sharpnack J, LCSW 08/26/2015, 4:51 PM

## 2015-08-26 NOTE — Progress Notes (Signed)
PROGRESS NOTE  [name] [name] ZOX:[id number] DOB: March 31, 1919 DOA: 08/24/2015 PCP: Kaleen Mask, MD  Assessment/Plan: Vomiting: resolved, advance diet  ARF (acute renal failure): IVF  UTI (lower urinary tract infection): e coli- await sensitivities, start Rocephin, follow culture  CAD (coronary artery disease): Without chest pain or shortness of breath, continue antiplatelets and metoprolol.   Essential hypertension, benign: Continue metoprolol, follow.  History of glaucoma: Continue usual eyedrops.  Code Status: DNR Family Communication: daughter at bedside 12/31- called 1/1 no answer Disposition Plan: back to SNF Monday   Consultants:    Procedures:     HPI/Subjective: No SOB  Objective: Filed Vitals:   08/26/15 0518 08/26/15 1009  BP: 117/99 139/51  Pulse: 81 64  Temp: 98.2 F (36.8 C)   Resp: 18     Intake/Output Summary (Last 24 hours) at 08/26/15 1248 Last data filed at 08/26/15 8119  Gross per 24 hour  Intake    600 ml  Output      0 ml  Net    600 ml   Filed Weights   08/24/15 1600  Weight: 77.429 kg (170 lb 11.2 oz)    Exam:   General:  Awake, pleasant  Cardiovascular: rrr  Respiratory: clear  Abdomen: +BS, soft  Musculoskeletal: no edema   Data Reviewed: Basic Metabolic Panel:  Recent Labs Lab 08/24/15 0938 08/25/15 0320 08/26/15 0827  NA 140 141 136  K 4.1 4.6 4.0  CL 101 104 101  CO2 27 27 27   GLUCOSE 146* 90 117*  BUN 31* 26* 16  CREATININE 1.82* 1.48* 1.18*  CALCIUM 9.6 9.0 8.9   Liver Function Tests:  Recent Labs Lab 08/24/15 0938  AST 27  ALT 18  ALKPHOS 86  BILITOT 0.5  PROT 7.9  ALBUMIN 3.3*    Recent Labs Lab 08/24/15 0938  LIPASE 40   No results for input(s): AMMONIA in the last 168 hours. CBC:  Recent Labs Lab 08/24/15 0938 08/25/15 0320  WBC 11.2* 8.2  NEUTROABS 9.3*  --   HGB 12.3 10.9*  HCT 38.6 34.8*  MCV 95.8 96.7  PLT 253 225   Cardiac Enzymes: No results for  input(s): CKTOTAL, CKMB, CKMBINDEX, TROPONINI in the last 168 hours. BNP (last 3 results) No results for input(s): BNP in the last 8760 hours.  ProBNP (last 3 results) No results for input(s): PROBNP in the last 8760 hours.  CBG: No results for input(s): GLUCAP in the last 168 hours.  Recent Results (from the past 240 hour(s))  Urine culture     Status: None (Preliminary result)   Collection Time: 08/24/15 10:11 AM  Result Value Ref Range Status   Specimen Description URINE, CLEAN CATCH  Final   Special Requests Normal  Final   Culture >=100,000 COLONIES/mL ESCHERICHIA COLI  Final   Report Status PENDING  Incomplete   Organism ID, Bacteria ESCHERICHIA COLI  Final      Susceptibility   Escherichia coli - MIC*    AMPICILLIN <=2 SENSITIVE Sensitive     CEFAZOLIN <=4 SENSITIVE Sensitive     CEFTRIAXONE <=1 SENSITIVE Sensitive     CIPROFLOXACIN <=0.25 SENSITIVE Sensitive     GENTAMICIN <=1 SENSITIVE Sensitive     IMIPENEM <=0.25 SENSITIVE Sensitive     NITROFURANTOIN <=16 SENSITIVE Sensitive     TRIMETH/SULFA <=20 SENSITIVE Sensitive     AMPICILLIN/SULBACTAM <=2 SENSITIVE Sensitive     PIP/TAZO <=4 SENSITIVE Sensitive     * >=100,000 COLONIES/mL ESCHERICHIA COLI  MRSA PCR Screening  Status: Abnormal   Collection Time: 08/24/15  4:23 PM  Result Value Ref Range Status   MRSA by PCR POSITIVE (A) NEGATIVE Final    Comment:        The GeneXpert MRSA Assay (FDA approved for NASAL specimens only), is one component of a comprehensive MRSA colonization surveillance program. It is not intended to diagnose MRSA infection nor to guide or monitor treatment for MRSA infections. RESULT CALLED TO, READ BACK BY AND VERIFIED WITH: KUFFOUR RN 17:55 08/24/15 ([email or web address])      Studies: Ct Abdomen Pelvis Wo Contrast  08/24/2015  CLINICAL DATA:  82 year old female with subacute intermittent abdominal and pelvic pain. EXAM: CT ABDOMEN AND PELVIS WITHOUT CONTRAST TECHNIQUE: Multidetector CT  imaging of the abdomen and pelvis was performed following the standard protocol without IV contrast. COMPARISON:  10/18/2010 CT FINDINGS: Please note that parenchymal abnormalities may be missed without intravenous contrast. Lower chest: Cardiomegaly and 3.5 cm descending thoracic aortic aneurysm again identified. Hepatobiliary: The liver is unremarkable. The patient is status post cholecystectomy. There is no evidence of biliary dilatation. Pancreas: Unremarkable Spleen: Unremarkable Adrenals/Urinary Tract: Bilateral renal atrophy and probable bilateral renal cysts again identified. The adrenal glands are unremarkable. A left bladder diverticulum is again noted. Stomach/Bowel: There is no evidence of bowel obstruction or definite bowel wall thickening. Colonic diverticulosis noted without evidence of diverticulitis. Vascular/Lymphatic: No enlarged lymph nodes. A 4.1 cm infrarenal abdominal aortic aneurysm is identified, previously measuring 3.7 cm in 2012. There is no evidence of retro peritoneal hematoma or periaortic stranding. Reproductive: Prostate enlargement noted. Other: No free fluid, focal collection or pneumoperitoneum. Musculoskeletal: Right total hip arthroplasty obscures some detail on the pelvis. No acute or suspicious abnormalities. Mild degenerative changes within the lumbar spine identified. IMPRESSION: No evidence of acute abnormality. Cardiomegaly and thoracic and aortic aneurysms as described, with maximal diameter of 3.5 cm of the descending thoracic aorta and 4.1 cm of the infrarenal abdominal aorta. Recommend followup by ultrasound in 1 year as clinically indicated. Colonic diverticulosis without diverticulitis. Electronically Signed   By: Harmon PierJeffrey  Hu M.D.   On: 08/24/2015 13:24    Scheduled Meds: . aspirin  81 mg Oral Daily  . cefTRIAXone (ROCEPHIN)  IV  1 g Intravenous Q24H  . clopidogrel  75 mg Oral Daily  . dorzolamide-timolol  1 drop Both Eyes Q12H  . heparin  5,000 Units  Subcutaneous 3 times per day  . latanoprost  1 drop Both Eyes QHS  . lidocaine (PF)      . metoprolol tartrate  25 mg Oral BID  . multivitamin with minerals  1 tablet Oral Daily  . omega-3 acid ethyl esters  1 g Oral Daily  . pantoprazole  40 mg Oral Daily  . sodium chloride  3 mL Intravenous Q12H   Continuous Infusions: . sodium chloride 75 mL/hr at 08/24/15 1619   Antibiotics Given (last 72 hours)    Date/Time Action Medication Dose Rate   08/24/15 1444 Given   levofloxacin (LEVAQUIN) tablet 750 mg 750 mg    08/24/15 1742 Given   cefTRIAXone (ROCEPHIN) 1 g in dextrose 5 % 50 mL IVPB 1 g 100 mL/hr   08/25/15 1658 Given   cefTRIAXone (ROCEPHIN) 1 g in dextrose 5 % 50 mL IVPB 1 g 100 mL/hr      Principal Problem:   ARF (acute renal failure) (HCC) Active Problems:   CAD (coronary artery disease)   Essential hypertension, benign   UTI (lower urinary tract infection)  Time spent: 25 min    Travanti Mcmanus U [facility]  [facility] Pager [phone]. If 7PM-7AM, please contact night-coverage at [email or web address], password [facility] 08/26/2015, 12:48 PM  LOS: 2 days

## 2015-08-27 LAB — URINE CULTURE: SPECIAL REQUESTS: NORMAL

## 2015-08-27 MED ORDER — OXYCODONE HCL 5 MG PO TABS: 5.0000 mg | ORAL_TABLET | ORAL | Status: DC | PRN

## 2015-08-27 MED ORDER — FUROSEMIDE 20 MG PO TABS: 20.0000 mg | ORAL_TABLET | Freq: Every day | ORAL | Status: DC | PRN

## 2015-08-27 NOTE — Progress Notes (Signed)
[facility] will [facility] Anticipated discharge date: 01/02 Family notified: pt dtr Transportation by PTAR- scheduled for 11:30am  CSW signing off.  Merlyn LotJenna Holoman, LCSWA Clinical Social Worker [phone]

## 2015-08-27 NOTE — Discharge Summary (Addendum)
Physician Discharge Summary  [name] CocoLena B Gavitt [id number] DOB: May 20, 1919 DOA: 08/24/2015  PCP: Kaleen MaskELKINS,WILSON OLIVER, MD  Admit date: 08/24/2015 Discharge date: 08/27/2015  Time spent: 35 minutes  Recommendations for Outpatient Follow-up:  [facility] 2. DNR   Discharge Diagnoses:  Principal Problem:   ARF (acute renal failure) (HCC) Active Problems:   CAD (coronary artery disease)   Essential hypertension, benign   UTI (lower urinary tract infection)   Discharge Condition: improved  Diet recommendation: cardiac  Filed Weights   08/24/15 1600  Weight: 77.429 kg (170 lb 11.2 oz)    History of present illness:  [name] [name] is a 82 y.o. female with a Past Medical History of hypertension, CAD, glaucoma who presents today with the above noted complaint. Patient is a resident of a local skilled nursing facility. She was in her usual state of health until this morning when she woke up and started having numerous episodes of non-bloody emesis. She denied any abdominal pain, or fever. There is no history of diarrhea. She was subsequently brought to the emergency room for further evaluation, and was found to have mild acute renal failure. I was subsequently asked to admit this patient for further evaluation and treatment. While in the emergency room, patient has had no further episodes of emesis. She continues to deny any abdominal pain. There is no history of chest pain, shortness of breath, fever.  Hospital Course:  Vomiting: resolved, tolerating diet-- most likely related to UTI  ARF (acute renal failure): IVF Make lasix PRN  UTI (lower urinary tract infection): e coli-pan sensitive--- treated with 3 days of rocephin  CAD (coronary artery disease): Without chest pain or shortness of breath, continue antiplatelets and metoprolol.   Essential hypertension, benign: Continue metoprolol, follow.  History of glaucoma: Continue usual  eyedrops.  Procedures:    Consultations:    Discharge Exam: Filed Vitals:   08/27/15 0633 08/27/15 0936  BP: 158/68 132/62  Pulse:  72  Temp:    Resp:      General: awake, NAD-pleasant/cooperative   Discharge Instructions   Discharge Instructions    Diet general    Complete by:  As directed      Increase activity slowly    Complete by:  As directed           Current Discharge Medication List    CONTINUE these medications which have CHANGED   Details  furosemide (LASIX) 20 MG tablet Take 1 tablet (20 mg total) by mouth daily as needed for fluid or edema. Qty: 30 tablet    oxyCODONE (ROXICODONE) 5 MG immediate release tablet Take 1 tablet (5 mg total) by mouth every 4 (four) hours as needed for severe pain. Qty: 10 tablet, Refills: 0      CONTINUE these medications which have NOT CHANGED   Details  acetaminophen (TYLENOL) 500 MG tablet Take 500 mg by mouth at bedtime. pain    aspirin 81 MG chewable tablet Chew 81 mg by mouth daily.    clopidogrel (PLAVIX) 75 MG tablet Take 75 mg by mouth daily.     Dorzolamide HCl-Timolol Mal PF 22.3-6.8 MG/ML SOLN Place 1 drop into both eyes every 12 (twelve) hours.     fish oil-omega-3 fatty acids 1000 MG capsule Take 1 g by mouth daily.    latanoprost (XALATAN) 0.005 % ophthalmic solution Place 1 drop into both eyes at bedtime.    magnesium hydroxide (MILK OF MAGNESIA) 400 MG/5ML suspension Take 30 mLs by mouth daily  as needed for mild constipation.    metoprolol tartrate (LOPRESSOR) 25 MG tablet Take 25 mg by mouth 2 (two) times daily.    Multiple Vitamins-Minerals (ICAPS) TABS Take 1 tablet by mouth daily.    pantoprazole (PROTONIX) 40 MG tablet Take 1 tablet (40 mg total) by mouth daily. Qty: 30 tablet, Refills: 11    promethazine (PHENERGAN) 25 MG tablet Take 25 mg by mouth every 6 (six) hours as needed for nausea or vomiting.      STOP taking these medications     traMADol (ULTRAM) 50 MG tablet         Allergies  Allergen Reactions  . Codeine Nausea And Vomiting  . Morphine And Related Nausea And Vomiting  . Penicillins Hives      The results of significant diagnostics from this hospitalization (including imaging, microbiology, ancillary and laboratory) are listed below for reference.    Significant Diagnostic Studies: Ct Abdomen Pelvis Wo Contrast  08/24/2015  CLINICAL DATA:  82 year old female with subacute intermittent abdominal and pelvic pain. EXAM: CT ABDOMEN AND PELVIS WITHOUT CONTRAST TECHNIQUE: Multidetector CT imaging of the abdomen and pelvis was performed following the standard protocol without IV contrast. COMPARISON:  10/18/2010 CT FINDINGS: Please note that parenchymal abnormalities may be missed without intravenous contrast. Lower chest: Cardiomegaly and 3.5 cm descending thoracic aortic aneurysm again identified. Hepatobiliary: The liver is unremarkable. The patient is status post cholecystectomy. There is no evidence of biliary dilatation. Pancreas: Unremarkable Spleen: Unremarkable Adrenals/Urinary Tract: Bilateral renal atrophy and probable bilateral renal cysts again identified. The adrenal glands are unremarkable. A left bladder diverticulum is again noted. Stomach/Bowel: There is no evidence of bowel obstruction or definite bowel wall thickening. Colonic diverticulosis noted without evidence of diverticulitis. Vascular/Lymphatic: No enlarged lymph nodes. A 4.1 cm infrarenal abdominal aortic aneurysm is identified, previously measuring 3.7 cm in 2012. There is no evidence of retro peritoneal hematoma or periaortic stranding. Reproductive: Prostate enlargement noted. Other: No free fluid, focal collection or pneumoperitoneum. Musculoskeletal: Right total hip arthroplasty obscures some detail on the pelvis. No acute or suspicious abnormalities. Mild degenerative changes within the lumbar spine identified. IMPRESSION: No evidence of acute abnormality. Cardiomegaly and thoracic  and aortic aneurysms as described, with maximal diameter of 3.5 cm of the descending thoracic aorta and 4.1 cm of the infrarenal abdominal aorta. Recommend followup by ultrasound in 1 year as clinically indicated. Colonic diverticulosis without diverticulitis. Electronically Signed   By: Harmon Pier M.D.   On: 08/24/2015 13:24   Dg Chest 2 View  08/24/2015  CLINICAL DATA:  Cough.  Vomiting. EXAM: CHEST  2 VIEW COMPARISON:  04/10/2014 FINDINGS: Heart size and pulmonary vascularity are normal. There is extensive calcification as well as tortuosity of the thoracic aorta. Lungs are clear. No acute osseous abnormality. Chronic accentuation of the thoracic kyphosis. IMPRESSION: No acute abnormality.  Aortic atherosclerosis. Electronically Signed   By: Francene Boyers M.D.   On: 08/24/2015 09:14    Microbiology: Recent Results (from the past 240 hour(s))  Urine culture     Status: None (Preliminary result)   Collection Time: 08/24/15 10:11 AM  Result Value Ref Range Status   Specimen Description URINE, CLEAN CATCH  Final   Special Requests Normal  Final   Culture >=100,000 COLONIES/mL ESCHERICHIA COLI  Final   Report Status PENDING  Incomplete   Organism ID, Bacteria ESCHERICHIA COLI  Final      Susceptibility   Escherichia coli - MIC*    AMPICILLIN <=2 SENSITIVE Sensitive  CEFAZOLIN <=4 SENSITIVE Sensitive     CEFTRIAXONE <=1 SENSITIVE Sensitive     CIPROFLOXACIN <=0.25 SENSITIVE Sensitive     GENTAMICIN <=1 SENSITIVE Sensitive     IMIPENEM <=0.25 SENSITIVE Sensitive     NITROFURANTOIN <=16 SENSITIVE Sensitive     TRIMETH/SULFA <=20 SENSITIVE Sensitive     AMPICILLIN/SULBACTAM <=2 SENSITIVE Sensitive     PIP/TAZO <=4 SENSITIVE Sensitive     * >=100,000 COLONIES/mL ESCHERICHIA COLI  MRSA PCR Screening     Status: Abnormal   Collection Time: 08/24/15  4:23 PM  Result Value Ref Range Status   MRSA by PCR POSITIVE (A) NEGATIVE Final    Comment:        The GeneXpert MRSA Assay  (FDA approved for NASAL specimens only), is one component of a comprehensive MRSA colonization surveillance program. It is not intended to diagnose MRSA infection nor to guide or monitor treatment for MRSA infections. RESULT CALLED TO, READ BACK BY AND VERIFIED WITH: KUFFOUR RN 17:55 08/24/15 ([email or web address])      Labs: Basic Metabolic Panel:  Recent Labs Lab 08/24/15 0938 08/25/15 0320 08/26/15 0827  NA 140 141 136  K 4.1 4.6 4.0  CL 101 104 101  CO2 27 27 27   GLUCOSE 146* 90 117*  BUN 31* 26* 16  CREATININE 1.82* 1.48* 1.18*  CALCIUM 9.6 9.0 8.9   Liver Function Tests:  Recent Labs Lab 08/24/15 0938  AST 27  ALT 18  ALKPHOS 86  BILITOT 0.5  PROT 7.9  ALBUMIN 3.3*    Recent Labs Lab 08/24/15 0938  LIPASE 40   No results for input(s): AMMONIA in the last 168 hours. CBC:  Recent Labs Lab 08/24/15 0938 08/25/15 0320  WBC 11.2* 8.2  NEUTROABS 9.3*  --   HGB 12.3 10.9*  HCT 38.6 34.8*  MCV 95.8 96.7  PLT 253 225   Cardiac Enzymes: No results for input(s): CKTOTAL, CKMB, CKMBINDEX, TROPONINI in the last 168 hours. BNP: BNP (last 3 results) No results for input(s): BNP in the last 8760 hours.  ProBNP (last 3 results) No results for input(s): PROBNP in the last 8760 hours.  CBG: No results for input(s): GLUCAP in the last 168 hours.     Signed:  Joseph Art DO [facility] 08/27/2015, 9:43 AM

## 2015-08-27 NOTE — Progress Notes (Signed)
Called report to [name]. Awaiting arrival of the ambulance.   [name] [name], [name] DaltonAnn Brooke RN

## 2015-08-27 NOTE — Progress Notes (Signed)
Ambulance arrived and Patient transported to [name].  [name] [name], Charlaine DaltonAnn Brooke RN

## 2015-08-28 ENCOUNTER — Encounter: Payer: Self-pay | Admitting: Internal Medicine

## 2015-08-28 ENCOUNTER — Other Ambulatory Visit: Payer: Self-pay | Admitting: *Deleted

## 2015-08-28 ENCOUNTER — Non-Acute Institutional Stay (SKILLED_NURSING_FACILITY): Payer: Medicare Other | Admitting: Internal Medicine

## 2015-08-28 DIAGNOSIS — N390 Urinary tract infection, site not specified: Secondary | ICD-10-CM

## 2015-08-28 DIAGNOSIS — K219 Gastro-esophageal reflux disease without esophagitis: Secondary | ICD-10-CM | POA: Diagnosis not present

## 2015-08-28 DIAGNOSIS — N179 Acute kidney failure, unspecified: Secondary | ICD-10-CM

## 2015-08-28 DIAGNOSIS — B9620 Unspecified Escherichia coli [E. coli] as the cause of diseases classified elsewhere: Secondary | ICD-10-CM | POA: Insufficient documentation

## 2015-08-28 DIAGNOSIS — I10 Essential (primary) hypertension: Secondary | ICD-10-CM

## 2015-08-28 DIAGNOSIS — I25118 Atherosclerotic heart disease of native coronary artery with other forms of angina pectoris: Secondary | ICD-10-CM

## 2015-08-28 MED ORDER — OXYCODONE HCL 5 MG PO TABS: 5.0000 mg | ORAL_TABLET | ORAL | Status: DC | PRN

## 2015-08-28 NOTE — Assessment & Plan Note (Signed)
SNF - admit Cr 1.82, d/c Cr 1.18; will f/u with BMP in 5-7 days

## 2015-08-28 NOTE — Assessment & Plan Note (Signed)
):   e coli-pan sensitive--- treated with 3 days of rocephin SNF - no further tx required

## 2015-08-28 NOTE — Telephone Encounter (Signed)
[facility] 

## 2015-08-28 NOTE — Assessment & Plan Note (Addendum)
SNF - not stated as uncontrolled; will cont atenolol 25 mg BID and lasix prn

## 2015-08-28 NOTE — Assessment & Plan Note (Addendum)
SNF - stable without sx; cont metoprolol 25 BID and ASA 81 mg, plavix 75 mg daily

## 2015-08-28 NOTE — Progress Notes (Signed)
MRN: [id number] Name: [name] [name]  Sex: female Age: 82 y.o. DOB: 04-19-1919  [facility]:306 Level Of Care: SNF Provider: Merrilee SeashoreALEXANDER, Alon Mazor D Emergency Contacts: Extended Emergency Contact Information Primary Emergency Contact: Wicker,Theresa Address: [address]          [place], [place] [address] [place] Home Phone: [phone] Relation: Daughter Secondary Emergency Contact: Garner,Cathy Address: [phone]          [place] [id number] [place] Home Phone: [phone] Work Phone: [phone] Relation: Daughter  Code Status:   Allergies: Codeine; Morphine and related; and Penicillins  Chief Complaint  Patient presents with  . Readmit To SNF    HPI: Patient is 82 y.o. female with hypertension, CAD, glaucoma who presented to ED with multiple episodes of vomiting. [facility] for ab E Coli UTI presenting with vomiting. Hospital course was complicated by ARF. Pt is admitted to SNF for residential care. While at SNF pt will be followed for CAD, tx with mrtoprolol and plavix, HTN, tx with metoprolol and GERD, tx with protonox.  Past Medical History  Diagnosis Date  . Hypertension   . Cataract     both eyes  . Osteoarthritis   . Glaucoma   . Poor circulation   . CAD (coronary artery disease)     a. 02/2001 PTCA & BMS to the OM1;  b. 08/2001 CBA to ISR in OM1, CBA of mid LAD;  c. 02/2002 Failed PCI of mLAD occlusion, LCX nl, patent OM1 stent, RCA dom, 40p, PDA/PL nl, EF 50% mild antlat HK.  Marland Kitchen. Thyroid disease     hypothyroidism, hypothyroidism  . Hypertriglyceridemia   . UTI (urinary tract infection) 08/24/2015  . AKI (acute kidney injury) (HCC) 07/2015    Past Surgical History  Procedure Laterality Date  . Abdominal hysterectomy  1972  . Eye surgery  2007/2008    cataracts  . Cardiac surgery  2001    stent placement  . Hernia repair  2009  . Gallbladder surgery  2009  . Hip  arthroplasty  06/06/2012    Procedure: ARTHROPLASTY BIPOLAR HIP;  Surgeon: Eldred MangesMark C Yates, MD;  Location: [facility];  Service: Orthopedics;  Laterality: Right;  monopolar      Medication List       This list is accurate as of: 08/28/15 11:59 PM.  Always use your most recent med list.               acetaminophen 500 MG tablet  Commonly known as:  TYLENOL  Take 500 mg by mouth at bedtime. pain     aspirin 81 MG chewable tablet  Chew 81 mg by mouth daily.     clopidogrel 75 MG tablet  Commonly known as:  PLAVIX  Take 75 mg by mouth daily.     Dorzolamide HCl-Timolol Mal PF 22.3-6.8 MG/ML Soln  Place 1 drop into both eyes every 12 (twelve) hours.     fish oil-omega-3 fatty acids 1000 MG capsule  Take 1 g by mouth daily.     furosemide 20 MG tablet  Commonly known as:  LASIX  Take 1 tablet (20 mg total) by mouth daily as needed for fluid or edema.     ICAPS Tabs  Take 1 tablet by mouth daily.     latanoprost 0.005 % ophthalmic solution  Commonly known as:  XALATAN  Place 1 drop into both eyes at bedtime.  magnesium hydroxide 400 MG/5ML suspension  Commonly known as:  MILK OF MAGNESIA  Take 30 mLs by mouth daily as needed for mild constipation.     metoprolol tartrate 25 MG tablet  Commonly known as:  LOPRESSOR  Take 25 mg by mouth 2 (two) times daily.     oxyCODONE 5 MG immediate release tablet  Commonly known as:  ROXICODONE  Take 1 tablet (5 mg total) by mouth every 4 (four) hours as needed for severe pain.     pantoprazole 40 MG tablet  Commonly known as:  PROTONIX  Take 1 tablet (40 mg total) by mouth daily.     promethazine 25 MG tablet  Commonly known as:  PHENERGAN  Take 25 mg by mouth every 6 (six) hours as needed for nausea or vomiting.        No orders of the defined types were placed in this encounter.    Immunization History  Administered Date(s) Administered  . Influenza Whole 05/30/2013  . Influenza-Unspecified 06/23/2014, 06/24/2015  .  Pneumococcal-Unspecified 08/23/2013    Social History  Substance Use Topics  . Smoking status: Never Smoker   . Smokeless tobacco: Never Used  . Alcohol Use: No    Family history is +HD,HTN, stroke   Review of Systems  DATA OBTAINED: from patient, nurse GENERAL:  no fevers, fatigue, appetite changes SKIN: No itching, rash or wounds EYES: No eye pain, redness, discharge EARS: No earache, tinnitus, change in hearing NOSE: No congestion, drainage or bleeding  MOUTH/THROAT: No mouth or tooth pain, No sore throat RESPIRATORY: No cough, wheezing, SOB CARDIAC: No chest pain, palpitations, lower extremity edema  GI: No abdominal pain, No N/V/D or constipation, No heartburn or reflux  GU: No dysuria, frequency or urgency, or incontinence  MUSCULOSKELETAL: No unrelieved bone/joint pain NEUROLOGIC: No headache, dizziness or focal weakness PSYCHIATRIC: No c/o anxiety or sadness   Filed Vitals:   08/28/15 1245  BP: 143/68  Pulse: 73  Temp: 97.9 F (36.6 C)  Resp: 18    SpO2 Readings from Last 1 Encounters:  08/27/15 93%        Physical Exam  GENERAL APPEARANCE: Alert, conversant,  No acute distress.  SKIN: No diaphoresis rash HEAD: Normocephalic, atraumatic  EYES: Conjunctiva/lids clear. Pupils round, reactive. EOMs intact.  EARS: External exam WNL, canals clear. Hearing grossly normal.  NOSE: No deformity or discharge.  MOUTH/THROAT: Lips w/o lesions  RESPIRATORY: Breathing is even, unlabored. Lung sounds are clear   CARDIOVASCULAR: Heart RRR no murmurs, rubs or gallops. No peripheral edema.   GASTROINTESTINAL: Abdomen is soft, non-tender, not distended w/ normal bowel sounds. GENITOURINARY: Bladder non tender, not distended  MUSCULOSKELETAL: No abnormal joints or musculature NEUROLOGIC:  Cranial nerves 2-12 grossly intact. Moves all extremities  PSYCHIATRIC: Mood and affect appropriate to situation, no behavioral issues  Patient Active Problem List   Diagnosis Date  Noted  . E. coli UTI (urinary tract infection) 08/28/2015  . GERD (gastroesophageal reflux disease) 08/28/2015  . UTI (lower urinary tract infection) 08/24/2015  . ARF (acute renal failure) (HCC) 08/24/2015  . Cough 09/24/2014  . Pain in joint, ankle and foot 04/15/2014  . Cellulitis 04/10/2014  . Pure hyperglyceridemia 12/22/2013  . Renal insufficiency 11/06/2013  . Chest pain 10/26/2013  . Epigastric pain 10/26/2013  . OA (osteoarthritis) 10/03/2013  . Edema 03/03/2013  . Essential hypertension, benign 01/05/2013  . Anemia 01/05/2013  . Glaucoma 06/06/2012  . CAD (coronary artery disease) 06/05/2012    CBC  Component Value Date/Time   WBC 8.2 08/25/2015 0320   RBC 3.60* 08/25/2015 0320   HGB 10.9* 08/25/2015 0320   HCT 34.8* 08/25/2015 0320   PLT 225 08/25/2015 0320   MCV 96.7 08/25/2015 0320   LYMPHSABS 1.4 08/24/2015 0938   MONOABS 0.6 08/24/2015 0938   EOSABS 0.0 08/24/2015 0938   BASOSABS 0.0 08/24/2015 0938    CMP     Component Value Date/Time   NA 136 08/26/2015 0827   K 4.0 08/26/2015 0827   CL 101 08/26/2015 0827   CO2 27 08/26/2015 0827   GLUCOSE 117* 08/26/2015 0827   BUN 16 08/26/2015 0827   CREATININE 1.18* 08/26/2015 0827   CALCIUM 8.9 08/26/2015 0827   PROT 7.9 08/24/2015 0938   ALBUMIN 3.3* 08/24/2015 0938   AST 27 08/24/2015 0938   ALT 18 08/24/2015 0938   ALKPHOS 86 08/24/2015 0938   BILITOT 0.5 08/24/2015 0938   GFRNONAA 38* 08/26/2015 0827   GFRAA 44* 08/26/2015 0827    No results found for: HGBA1C   Ct Abdomen Pelvis Wo Contrast  08/24/2015  CLINICAL DATA:  82 year old female with subacute intermittent abdominal and pelvic pain. EXAM: CT ABDOMEN AND PELVIS WITHOUT CONTRAST TECHNIQUE: Multidetector CT imaging of the abdomen and pelvis was performed following the standard protocol without IV contrast. COMPARISON:  10/18/2010 CT FINDINGS: Please note that parenchymal abnormalities may be missed without intravenous contrast. Lower chest:  Cardiomegaly and 3.5 cm descending thoracic aortic aneurysm again identified. Hepatobiliary: The liver is unremarkable. The patient is status post cholecystectomy. There is no evidence of biliary dilatation. Pancreas: Unremarkable Spleen: Unremarkable Adrenals/Urinary Tract: Bilateral renal atrophy and probable bilateral renal cysts again identified. The adrenal glands are unremarkable. A left bladder diverticulum is again noted. Stomach/Bowel: There is no evidence of bowel obstruction or definite bowel wall thickening. Colonic diverticulosis noted without evidence of diverticulitis. Vascular/Lymphatic: No enlarged lymph nodes. A 4.1 cm infrarenal abdominal aortic aneurysm is identified, previously measuring 3.7 cm in 2012. There is no evidence of retro peritoneal hematoma or periaortic stranding. Reproductive: Prostate enlargement noted. Other: No free fluid, focal collection or pneumoperitoneum. Musculoskeletal: Right total hip arthroplasty obscures some detail on the pelvis. No acute or suspicious abnormalities. Mild degenerative changes within the lumbar spine identified. IMPRESSION: No evidence of acute abnormality. Cardiomegaly and thoracic and aortic aneurysms as described, with maximal diameter of 3.5 cm of the descending thoracic aorta and 4.1 cm of the infrarenal abdominal aorta. Recommend followup by ultrasound in 1 year as clinically indicated. Colonic diverticulosis without diverticulitis. Electronically Signed   By: Harmon Pier M.D.   On: 08/24/2015 13:24   Dg Chest 2 View  08/24/2015  CLINICAL DATA:  Cough.  Vomiting. EXAM: CHEST  2 VIEW COMPARISON:  04/10/2014 FINDINGS: Heart size and pulmonary vascularity are normal. There is extensive calcification as well as tortuosity of the thoracic aorta. Lungs are clear. No acute osseous abnormality. Chronic accentuation of the thoracic kyphosis. IMPRESSION: No acute abnormality.  Aortic atherosclerosis. Electronically Signed   By: Francene Boyers M.D.    On: 08/24/2015 09:14    Not all labs, radiology exams or other studies done during hospitalization come through on my EPIC note; however they are reviewed by me.    Assessment and Plan  E. coli UTI (urinary tract infection) ): e coli-pan sensitive--- treated with 3 days of rocephin SNF - no further tx required  ARF (acute renal failure) (HCC) SNF - admit Cr 1.82, d/c Cr 1.18; will f/u with BMP in  5-7 days  Essential hypertension, benign SNF - not stated as uncontrolled; will cont atenolol 25 mg BID and lasix prn  CAD (coronary artery disease) SNF - stable without sx; cont metoprolol 25 BID and ASA 81 mg, plavix 75 mg daily  GERD (gastroesophageal reflux disease) SNF - chronic and stable; cont protonix 40 mg daily   Time spent > 45 min;> 50% of time with patient was spent reviewing records, labs, tests and studies, counseling and developing plan of care  Margit Hanks, MD

## 2015-08-28 NOTE — Assessment & Plan Note (Signed)
SNF - chronic and stable; cont protonix 40 mg daily 

## 2015-09-04 LAB — CBC AND DIFFERENTIAL
HCT: 32 % — AB (ref 36–46)
HEMOGLOBIN: 11.3 g/dL — AB (ref 12.0–16.0)
PLATELETS: 234 10*3/uL (ref 150–399)
WBC: 8.6 10^3/mL

## 2015-09-04 LAB — BASIC METABOLIC PANEL
BUN: 22 mg/dL — AB (ref 4–21)
CREATININE: 1.3 mg/dL — AB (ref 0.5–1.1)
Glucose: 98 mg/dL
POTASSIUM: 4.8 mmol/L (ref 3.4–5.3)
Sodium: 142 mmol/L (ref 137–147)

## 2015-09-10 LAB — TSH: TSH: 4.59 u[IU]/mL (ref 0.41–5.90)

## 2015-10-31 ENCOUNTER — Encounter: Payer: Self-pay | Admitting: Internal Medicine

## 2015-10-31 ENCOUNTER — Non-Acute Institutional Stay (SKILLED_NURSING_FACILITY): Payer: Medicare Other | Admitting: Internal Medicine

## 2015-10-31 DIAGNOSIS — N183 Chronic kidney disease, stage 3 unspecified: Secondary | ICD-10-CM

## 2015-10-31 DIAGNOSIS — D631 Anemia in chronic kidney disease: Secondary | ICD-10-CM | POA: Diagnosis not present

## 2015-10-31 DIAGNOSIS — H409 Unspecified glaucoma: Secondary | ICD-10-CM | POA: Diagnosis not present

## 2015-10-31 DIAGNOSIS — N189 Chronic kidney disease, unspecified: Secondary | ICD-10-CM

## 2015-10-31 NOTE — Progress Notes (Signed)
MRN: [id number] Name: [name] CocoLena B [name]  Sex: female Age: 82 y.o. DOB: Jun 11, 1919  [facility]: Level Of Care: SNF Provider: Merrilee SeashoreALEXANDER, Jenella Craigie D Emergency Contacts: [id number] Emergency Contact Information Primary Emergency Contact: Wicker,Theresa Address: [address]          [place], [place] [address] [place] Home Phone: [phone] Relation: Daughter Secondary Emergency Contact: Garner,Cathy Address: [phone]          [place] [id number] [place] Home Phone: [phone] Work Phone: [phone] Relation: Daughter  Code Status:   Allergies: Codeine; Morphine and related; and Penicillins  Chief Complaint  Patient presents with  . Medical Management of Chronic Issues    HPI: Patient is 82 y.o. female who is being seen for routine issues of CKD3, anemia and glaucoma.  Past Medical History  Diagnosis Date  . Hypertension   . Cataract     both eyes  . Osteoarthritis   . Glaucoma   . Poor circulation   . CAD (coronary artery disease)     a. 02/2001 PTCA & BMS to the OM1;  b. 08/2001 CBA to ISR in OM1, CBA of mid LAD;  c. 02/2002 Failed PCI of mLAD occlusion, LCX nl, patent OM1 stent, RCA dom, 40p, PDA/PL nl, EF 50% mild antlat HK.  Marland Kitchen. Thyroid disease     hypothyroidism, hypothyroidism  . Hypertriglyceridemia   . UTI (urinary tract infection) 08/24/2015  . AKI (acute kidney injury) (HCC) 07/2015    Past Surgical History  Procedure Laterality Date  . Abdominal hysterectomy  1972  . Eye surgery  2007/2008    cataracts  . Cardiac surgery  2001    stent placement  . Hernia repair  2009  . Gallbladder surgery  2009  . Hip arthroplasty  06/06/2012    Procedure: ARTHROPLASTY BIPOLAR HIP;  Surgeon: Eldred MangesMark C Yates, MD;  Location: [facility];  Service: Orthopedics;  Laterality: Right;  monopolar      Medication List       This list is accurate as of: 10/31/15 11:59 PM.  Always use your most recent med list.               acetaminophen 500 MG tablet  Commonly known as:  TYLENOL  Take 500 mg by mouth at bedtime. pain     aspirin 81 MG chewable tablet  Chew 81 mg by mouth daily.     clopidogrel 75 MG tablet  Commonly known as:  PLAVIX  Take 75 mg by mouth daily.     Dorzolamide HCl-Timolol Mal PF 22.3-6.8 MG/ML Soln  Place 1 drop into both eyes every 12 (twelve) hours.     fish oil-omega-3 fatty acids 1000 MG capsule  Take 1 g by mouth daily.     furosemide 20 MG tablet  Commonly known as:  LASIX  Take 1 tablet (20 mg total) by mouth daily as needed for fluid or edema.     ICAPS Tabs  Take 1 tablet by mouth daily.     latanoprost 0.005 % ophthalmic solution  Commonly known as:  XALATAN  Place 1 drop into both eyes at bedtime.     magnesium hydroxide 400 MG/5ML suspension  Commonly known as:  MILK OF MAGNESIA  Take 30 mLs by mouth daily as needed for mild constipation.     metoprolol tartrate 25 MG tablet  Commonly known as:  LOPRESSOR  Take 25 mg by mouth 2 (two) times daily.  oxyCODONE 5 MG immediate release tablet  Commonly known as:  ROXICODONE  Take 1 tablet (5 mg total) by mouth every 4 (four) hours as needed for severe pain.     pantoprazole 40 MG tablet  Commonly known as:  PROTONIX  Take 1 tablet (40 mg total) by mouth daily.     promethazine 25 MG tablet  Commonly known as:  PHENERGAN  Take 25 mg by mouth every 6 (six) hours as needed for nausea or vomiting.        No orders of the defined types were placed in this encounter.    Immunization History  Administered Date(s) Administered  . Influenza Whole 05/30/2013  . Influenza-Unspecified 06/23/2014, 05/21/2015  . PPD Test 10/26/2013  . Pneumococcal-Unspecified 08/23/2013    Social History  Substance Use Topics  . Smoking status: Never Smoker   . Smokeless tobacco: Never Used  . Alcohol Use: No    Review of Systems  DATA OBTAINED: from patient, nurse GENERAL:  no fevers, fatigue, appetite changes SKIN:  No itching, rash HEENT: No complaint RESPIRATORY: No cough, wheezing, SOB CARDIAC: No chest pain, palpitations, lower extremity edema  GI: No abdominal pain, No N/V/D or constipation, No heartburn or reflux  GU: No dysuria, frequency or urgency, or incontinence  MUSCULOSKELETAL: No unrelieved bone/joint pain NEUROLOGIC: No headache, dizziness  PSYCHIATRIC: No overt anxiety or sadness  Filed Vitals:   10/31/15 1417  BP: 138/68  Pulse: 59  Temp: 98.4 F (36.9 C)  Resp: 16    Physical Exam  GENERAL APPEARANCE: Alert, No acute distress  SKIN: No diaphoresis rash HEENT: Unremarkable RESPIRATORY: Breathing is even, unlabored. Lung sounds are clear   CARDIOVASCULAR: Heart RRR no murmurs, rubs or gallops. No peripheral edema  GASTROINTESTINAL: Abdomen is soft, non-tender, not distended w/ normal bowel sounds.  GENITOURINARY: Bladder non tender, not distended  MUSCULOSKELETAL: No abnormal joints or musculature NEUROLOGIC: Cranial nerves 2-12 grossly intact. Moves all extremities PSYCHIATRIC: Mood and affect appropriate to situation, no behavioral issues  Patient Active Problem List   Diagnosis Date Noted  . CKD (chronic kidney disease) stage 3, GFR 30-59 ml/min 11/18/2015  . E. coli UTI (urinary tract infection) 08/28/2015  . GERD (gastroesophageal reflux disease) 08/28/2015  . UTI (lower urinary tract infection) 08/24/2015  . ARF (acute renal failure) (HCC) 08/24/2015  . Cough 09/24/2014  . Pain in joint, ankle and foot 04/15/2014  . Cellulitis 04/10/2014  . Pure hyperglyceridemia 12/22/2013  . Renal insufficiency 11/06/2013  . Chest pain 10/26/2013  . Epigastric pain 10/26/2013  . OA (osteoarthritis) 10/03/2013  . Edema 03/03/2013  . Essential hypertension, benign 01/05/2013  . Anemia 01/05/2013  . Glaucoma 06/06/2012  . CAD (coronary artery disease) 06/05/2012    CBC    Component Value Date/Time   WBC 8.6 09/04/2015   WBC 8.2 08/25/2015 0320   RBC 3.60* 08/25/2015  0320   HGB 11.3* 09/04/2015   HCT 32* 09/04/2015   PLT 234 09/04/2015   MCV 96.7 08/25/2015 0320   LYMPHSABS 1.4 08/24/2015 0938   MONOABS 0.6 08/24/2015 0938   EOSABS 0.0 08/24/2015 0938   BASOSABS 0.0 08/24/2015 0938    CMP     Component Value Date/Time   NA 142 09/04/2015   NA 136 08/26/2015 0827   K 4.8 09/04/2015   CL 101 08/26/2015 0827   CO2 27 08/26/2015 0827   GLUCOSE 117* 08/26/2015 0827   BUN 22* 09/04/2015   BUN 16 08/26/2015 0827   CREATININE 1.3* 09/04/2015  CREATININE 1.18* 08/26/2015 0827   CALCIUM 8.9 08/26/2015 0827   PROT 7.9 08/24/2015 0938   ALBUMIN 3.3* 08/24/2015 0938   AST 27 08/24/2015 0938   ALT 18 08/24/2015 0938   ALKPHOS 86 08/24/2015 0938   BILITOT 0.5 08/24/2015 0938   GFRNONAA 38* 08/26/2015 0827   GFRAA 44* 08/26/2015 0827    Assessment and Plan  CKD (chronic kidney disease) stage 3, GFR 30-59 ml/min In 08/2015 GFR 38, BUN/Cr  22/1.3; will monitor at intervals  Anemia In 08/2015 H/H 11.3/32.3; this is stable; will monitor at intervals  Glaucoma Chronic and stable;cont xalantin drops and cosopt    Margit Hanks, MD

## 2015-11-05 LAB — TSH: TSH: 14.63 u[IU]/mL — AB (ref 0.41–5.90)

## 2015-11-18 ENCOUNTER — Encounter: Payer: Self-pay | Admitting: Internal Medicine

## 2015-11-18 DIAGNOSIS — N184 Chronic kidney disease, stage 4 (severe): Secondary | ICD-10-CM | POA: Insufficient documentation

## 2015-11-18 NOTE — Assessment & Plan Note (Signed)
In 08/2015 H/H 11.3/32.3; this is stable; will monitor at intervals

## 2015-11-18 NOTE — Assessment & Plan Note (Signed)
In 08/2015 GFR 38, BUN/Cr  22/1.3; will monitor at intervals

## 2015-11-18 NOTE — Assessment & Plan Note (Addendum)
Chronic and stable;cont xalantin drops and cosopt

## 2015-11-21 ENCOUNTER — Emergency Department (HOSPITAL_COMMUNITY): Payer: Medicare Other

## 2015-11-21 ENCOUNTER — Encounter (HOSPITAL_COMMUNITY): Payer: Self-pay | Admitting: Emergency Medicine

## 2015-11-21 ENCOUNTER — Observation Stay (HOSPITAL_COMMUNITY)
Admission: EM | Admit: 2015-11-21 | Discharge: 2015-11-24 | Disposition: A | Discharge status: Skilled Nursing Facility | Payer: Medicare Other | Attending: Family Medicine | Admitting: Family Medicine

## 2015-11-21 DIAGNOSIS — Z79899 Other long term (current) drug therapy: Secondary | ICD-10-CM | POA: Diagnosis not present

## 2015-11-21 DIAGNOSIS — N183 Chronic kidney disease, stage 3 (moderate): Secondary | ICD-10-CM | POA: Insufficient documentation

## 2015-11-21 DIAGNOSIS — B961 Klebsiella pneumoniae [K. pneumoniae] as the cause of diseases classified elsewhere: Secondary | ICD-10-CM | POA: Diagnosis present

## 2015-11-21 DIAGNOSIS — I129 Hypertensive chronic kidney disease with stage 1 through stage 4 chronic kidney disease, or unspecified chronic kidney disease: Secondary | ICD-10-CM | POA: Insufficient documentation

## 2015-11-21 DIAGNOSIS — Z515 Encounter for palliative care: Secondary | ICD-10-CM | POA: Insufficient documentation

## 2015-11-21 DIAGNOSIS — G92 Toxic encephalopathy: Secondary | ICD-10-CM | POA: Insufficient documentation

## 2015-11-21 DIAGNOSIS — R531 Weakness: Secondary | ICD-10-CM | POA: Insufficient documentation

## 2015-11-21 DIAGNOSIS — N12 Tubulo-interstitial nephritis, not specified as acute or chronic: Secondary | ICD-10-CM | POA: Diagnosis not present

## 2015-11-21 DIAGNOSIS — E860 Dehydration: Secondary | ICD-10-CM | POA: Diagnosis not present

## 2015-11-21 DIAGNOSIS — F0390 Unspecified dementia without behavioral disturbance: Secondary | ICD-10-CM | POA: Insufficient documentation

## 2015-11-21 DIAGNOSIS — Z880 Allergy status to penicillin: Secondary | ICD-10-CM | POA: Insufficient documentation

## 2015-11-21 DIAGNOSIS — I2510 Atherosclerotic heart disease of native coronary artery without angina pectoris: Secondary | ICD-10-CM | POA: Diagnosis present

## 2015-11-21 DIAGNOSIS — R079 Chest pain, unspecified: Secondary | ICD-10-CM | POA: Insufficient documentation

## 2015-11-21 DIAGNOSIS — E039 Hypothyroidism, unspecified: Secondary | ICD-10-CM | POA: Insufficient documentation

## 2015-11-21 DIAGNOSIS — Z7902 Long term (current) use of antithrombotics/antiplatelets: Secondary | ICD-10-CM | POA: Diagnosis not present

## 2015-11-21 DIAGNOSIS — I252 Old myocardial infarction: Secondary | ICD-10-CM | POA: Diagnosis not present

## 2015-11-21 DIAGNOSIS — N3090 Cystitis, unspecified without hematuria: Secondary | ICD-10-CM | POA: Diagnosis present

## 2015-11-21 DIAGNOSIS — A4159 Other Gram-negative sepsis: Secondary | ICD-10-CM | POA: Diagnosis not present

## 2015-11-21 DIAGNOSIS — N189 Chronic kidney disease, unspecified: Secondary | ICD-10-CM

## 2015-11-21 DIAGNOSIS — Z7982 Long term (current) use of aspirin: Secondary | ICD-10-CM | POA: Insufficient documentation

## 2015-11-21 DIAGNOSIS — J9601 Acute respiratory failure with hypoxia: Secondary | ICD-10-CM | POA: Diagnosis present

## 2015-11-21 DIAGNOSIS — N179 Acute kidney failure, unspecified: Secondary | ICD-10-CM | POA: Diagnosis present

## 2015-11-21 DIAGNOSIS — N390 Urinary tract infection, site not specified: Secondary | ICD-10-CM

## 2015-11-21 DIAGNOSIS — I10 Essential (primary) hypertension: Secondary | ICD-10-CM | POA: Diagnosis present

## 2015-11-21 DIAGNOSIS — Z66 Do not resuscitate: Secondary | ICD-10-CM | POA: Insufficient documentation

## 2015-11-21 DIAGNOSIS — B9689 Other specified bacterial agents as the cause of diseases classified elsewhere: Secondary | ICD-10-CM | POA: Diagnosis present

## 2015-11-21 LAB — URINE MICROSCOPIC-ADD ON

## 2015-11-21 LAB — BASIC METABOLIC PANEL
Anion gap: 12 (ref 5–15)
BUN: 32 mg/dL — ABNORMAL HIGH (ref 6–20)
CALCIUM: 9.1 mg/dL (ref 8.9–10.3)
CHLORIDE: 98 mmol/L — AB (ref 101–111)
CO2: 26 mmol/L (ref 22–32)
CREATININE: 2.02 mg/dL — AB (ref 0.44–1.00)
GFR calc Af Amer: 23 mL/min — ABNORMAL LOW (ref >60)
GFR calc non Af Amer: 20 mL/min — ABNORMAL LOW (ref >60)
GLUCOSE: 125 mg/dL — AB (ref 65–99)
Potassium: 4.7 mmol/L (ref 3.5–5.1)
Sodium: 136 mmol/L (ref 135–145)

## 2015-11-21 LAB — CBC WITH DIFFERENTIAL/PLATELET
Basophils Absolute: 0 10*3/uL (ref 0.0–0.1)
Basophils Relative: 0 %
EOS ABS: 0.1 10*3/uL (ref 0.0–0.7)
EOS PCT: 1 %
HCT: 39 % (ref 36.0–46.0)
Hemoglobin: 12.1 g/dL (ref 12.0–15.0)
LYMPHS ABS: 1.9 10*3/uL (ref 0.7–4.0)
Lymphocytes Relative: 18 %
MCH: 30.3 pg (ref 26.0–34.0)
MCHC: 31 g/dL (ref 30.0–36.0)
MCV: 97.7 fL (ref 78.0–100.0)
MONO ABS: 1.2 10*3/uL — AB (ref 0.1–1.0)
MONOS PCT: 12 %
Neutro Abs: 7.2 10*3/uL (ref 1.7–7.7)
Neutrophils Relative %: 69 %
PLATELETS: 218 10*3/uL (ref 150–400)
RBC: 3.99 MIL/uL (ref 3.87–5.11)
RDW: 13.7 % (ref 11.5–15.5)
WBC: 10.4 10*3/uL (ref 4.0–10.5)

## 2015-11-21 LAB — URINALYSIS, ROUTINE W REFLEX MICROSCOPIC
BILIRUBIN URINE: NEGATIVE
GLUCOSE, UA: NEGATIVE mg/dL
KETONES UR: NEGATIVE mg/dL
Nitrite: POSITIVE — AB
PH: 5 (ref 5.0–8.0)
Protein, ur: NEGATIVE mg/dL
SPECIFIC GRAVITY, URINE: 1.015 (ref 1.005–1.030)

## 2015-11-21 LAB — PROTIME-INR
INR: 1.16 (ref 0.00–1.49)
Prothrombin Time: 15 seconds (ref 11.6–15.2)

## 2015-11-21 LAB — D-DIMER, QUANTITATIVE (NOT AT ARMC): D DIMER QUANT: 3.66 ug{FEU}/mL — AB (ref 0.00–0.50)

## 2015-11-21 LAB — I-STAT TROPONIN, ED: TROPONIN I, POC: 0.01 ng/mL (ref 0.00–0.08)

## 2015-11-21 MED ORDER — OMEGA-3-ACID ETHYL ESTERS 1 G PO CAPS
1.0000 g | ORAL_CAPSULE | Freq: Every day | ORAL | Status: DC
  Administered 2015-11-22 – 2015-11-24 (×2): 1 g via ORAL
  Filled 2015-11-21 (×3): qty 1

## 2015-11-21 MED ORDER — ASPIRIN 81 MG PO CHEW
81.0000 mg | CHEWABLE_TABLET | Freq: Every day | ORAL | Status: DC
  Administered 2015-11-22 – 2015-11-24 (×3): 81 mg via ORAL
  Filled 2015-11-21 (×3): qty 1

## 2015-11-21 MED ORDER — IPRATROPIUM-ALBUTEROL 0.5-2.5 (3) MG/3ML IN SOLN
3.0000 mL | Freq: Once | RESPIRATORY_TRACT | Status: AC
  Administered 2015-11-21: 3 mL via RESPIRATORY_TRACT
  Filled 2015-11-21: qty 3

## 2015-11-21 MED ORDER — IPRATROPIUM-ALBUTEROL 0.5-2.5 (3) MG/3ML IN SOLN: 3.0000 mL | RESPIRATORY_TRACT | Status: DC | PRN

## 2015-11-21 MED ORDER — CLOPIDOGREL BISULFATE 75 MG PO TABS
75.0000 mg | ORAL_TABLET | Freq: Every day | ORAL | Status: DC
  Administered 2015-11-22 – 2015-11-24 (×3): 75 mg via ORAL
  Filled 2015-11-21 (×3): qty 1

## 2015-11-21 MED ORDER — PANTOPRAZOLE SODIUM 40 MG PO TBEC
40.0000 mg | DELAYED_RELEASE_TABLET | Freq: Every day | ORAL | Status: DC
  Administered 2015-11-22 – 2015-11-24 (×3): 40 mg via ORAL
  Filled 2015-11-21 (×3): qty 1

## 2015-11-21 MED ORDER — PROMETHAZINE HCL 25 MG PO TABS: 25.0000 mg | ORAL_TABLET | Freq: Four times a day (QID) | ORAL | Status: DC | PRN

## 2015-11-21 MED ORDER — DEXTROSE 5 % IV SOLN
1.0000 g | INTRAVENOUS | Status: DC
  Filled 2015-11-21 (×2): qty 1

## 2015-11-21 MED ORDER — OXYCODONE HCL 5 MG PO TABS: 5.0000 mg | ORAL_TABLET | ORAL | Status: DC | PRN

## 2015-11-21 MED ORDER — HEPARIN SODIUM (PORCINE) 5000 UNIT/ML IJ SOLN
5000.0000 [IU] | Freq: Three times a day (TID) | INTRAMUSCULAR | Status: DC
  Administered 2015-11-22 – 2015-11-24 (×6): 5000 [IU] via SUBCUTANEOUS
  Filled 2015-11-21 (×7): qty 1

## 2015-11-21 MED ORDER — DEXTROSE 5 % IV SOLN
2.0000 g | Freq: Once | INTRAVENOUS | Status: DC
  Filled 2015-11-21: qty 2

## 2015-11-21 MED ORDER — BENZONATATE 100 MG PO CAPS
200.0000 mg | ORAL_CAPSULE | Freq: Once | ORAL | Status: AC
  Administered 2015-11-21: 200 mg via ORAL
  Filled 2015-11-21: qty 2

## 2015-11-21 MED ORDER — ACETAMINOPHEN 500 MG PO TABS
500.0000 mg | ORAL_TABLET | Freq: Every day | ORAL | Status: DC
  Administered 2015-11-22 – 2015-11-23 (×2): 500 mg via ORAL
  Filled 2015-11-21 (×3): qty 1

## 2015-11-21 MED ORDER — MAGNESIUM HYDROXIDE 400 MG/5ML PO SUSP: 30.0000 mL | Freq: Every day | ORAL | Status: DC | PRN

## 2015-11-21 MED ORDER — LATANOPROST 0.005 % OP SOLN
1.0000 [drp] | Freq: Every day | OPHTHALMIC | Status: DC
  Administered 2015-11-22 – 2015-11-23 (×2): 1 [drp] via OPHTHALMIC
  Filled 2015-11-21: qty 2.5

## 2015-11-21 MED ORDER — METOPROLOL TARTRATE 25 MG PO TABS
25.0000 mg | ORAL_TABLET | Freq: Two times a day (BID) | ORAL | Status: DC
  Administered 2015-11-22 – 2015-11-24 (×5): 25 mg via ORAL
  Filled 2015-11-21 (×6): qty 1

## 2015-11-21 MED ORDER — DEXTROSE 5 % IV SOLN
1.0000 g | Freq: Once | INTRAVENOUS | Status: AC
  Administered 2015-11-21: 1 g via INTRAVENOUS
  Filled 2015-11-21: qty 10

## 2015-11-21 MED ORDER — DORZOLAMIDE HCL-TIMOLOL MAL 2-0.5 % OP SOLN
1.0000 [drp] | Freq: Two times a day (BID) | OPHTHALMIC | Status: DC
  Administered 2015-11-22 – 2015-11-24 (×4): 1 [drp] via OPHTHALMIC
  Filled 2015-11-21: qty 10

## 2015-11-21 MED ORDER — ACETAMINOPHEN 325 MG PO TABS
650.0000 mg | ORAL_TABLET | ORAL | Status: DC | PRN
  Administered 2015-11-22 – 2015-11-24 (×2): 650 mg via ORAL
  Filled 2015-11-21 (×2): qty 2

## 2015-11-21 MED ORDER — PROSIGHT PO TABS
1.0000 | ORAL_TABLET | Freq: Every day | ORAL | Status: DC
  Administered 2015-11-22 – 2015-11-24 (×2): 1 via ORAL
  Filled 2015-11-21 (×5): qty 1

## 2015-11-21 MED ORDER — ONDANSETRON HCL 4 MG/2ML IJ SOLN
4.0000 mg | Freq: Four times a day (QID) | INTRAMUSCULAR | Status: DC | PRN
  Administered 2015-11-24: 4 mg via INTRAVENOUS
  Filled 2015-11-21: qty 2

## 2015-11-21 MED ORDER — SODIUM CHLORIDE 0.9 % IV BOLUS (SEPSIS)
1000.0000 mL | Freq: Once | INTRAVENOUS | Status: AC
  Administered 2015-11-21: 1000 mL via INTRAVENOUS

## 2015-11-21 NOTE — ED Notes (Signed)
Per PA tapia 2L of oxygen via [place] was removed to see how pt can tolerate room air.

## 2015-11-21 NOTE — ED Notes (Signed)
Soiled pt cleaned and placed new adult brief on pt. Redness noted to sacrum area, also redness and skin tear to middle of buttocks.

## 2015-11-21 NOTE — ED Provider Notes (Signed)
CSN: [id number]     Arrival date & time 11/21/15  1756 History   First MD Initiated Contact with Patient 11/21/15 1815     Chief Complaint  Patient presents with  . Chest Pain   (Consider location/radiation/quality/duration/timing/severity/associated sxs/prior Treatment) The history is provided by the patient, a relative, the EMS personnel and the nursing home. No language interpreter was used.    Ms. [name] [name] is a 82 y.o female with with a history of PE, hypertension, thyroid disease, osteoarthritis, and cardiac stent was brought in by EMS from nursing home [facility]) after they noticed that the patient hadn't gotten up from bed. They state that she normally does go from bed to chair and is able to go to the bathroom herself. She has been more weak for the past 2 days that was still able to go to the bathroom until today when she was incontinent of urine and stool while lying in bed. She was clammy when they checked on her. She told the nurse at the nursing home that she felt short of breath with chest pain similar to when she had a pulmonary embolism in the past. She was given one nitroglycerin by nursing home staff. When EMS arrived her blood pressure was low in the 90s. They gave her 324 mg of aspirin in route. Her son says that he visited her yesterday and felt that she was more with lethargic and kept falling asleep during our conversations. She is currently on Plavix. He is not on oxygen regularly and is usually around 93% on room air. Patient denies ever smoking in her life. She is a poor historian on details about how she felt.  Nursing home denies any fever, cough, or urinary symptoms.    Past Medical History  Diagnosis Date  . Hypertension   . Cataract     both eyes  . Osteoarthritis   . Glaucoma   . Poor circulation   . CAD (coronary artery disease)     a. 02/2001 PTCA & BMS to the OM1;  b. 08/2001 CBA to ISR in OM1, CBA of mid LAD;  c. 02/2002 Failed PCI of mLAD occlusion, LCX nl,  patent OM1 stent, RCA dom, 40p, PDA/PL nl, EF 50% mild antlat HK.  Marland Kitchen Thyroid disease     hypothyroidism, hypothyroidism  . Hypertriglyceridemia   . UTI (urinary tract infection) 08/24/2015  . AKI (acute kidney injury) (HCC) 07/2015   Past Surgical History  Procedure Laterality Date  . Abdominal hysterectomy  1972  . Eye surgery  2007/2008    cataracts  . Cardiac surgery  2001    stent placement  . Hernia repair  2009  . Gallbladder surgery  2009  . Hip arthroplasty  06/06/2012    Procedure: ARTHROPLASTY BIPOLAR HIP;  Surgeon: Eldred Manges, MD;  Location: [facility];  Service: Orthopedics;  Laterality: Right;  monopolar   Family History  Problem Relation Age of Onset  . Arthritis Father   . Stroke Father   . Hypertension Father   . Heart disease Sister   . Heart disease Brother   . Hypertension Brother   . Stroke Brother    Social History  Substance Use Topics  . Smoking status: Never Smoker   . Smokeless tobacco: Never Used  . Alcohol Use: No   OB History    Gravida Para Term Preterm AB TAB SAB Ectopic Multiple Living   1  Review of Systems  Constitutional: Negative for fever and chills.  Respiratory: Positive for shortness of breath.   Cardiovascular: Positive for chest pain.  Gastrointestinal: Negative for nausea and vomiting.  Neurological: Positive for weakness.  All other systems reviewed and are negative.     Allergies  Codeine; Morphine and related; and Penicillins  Home Medications   Prior to Admission medications   Medication Sig Start Date End Date Taking? Authorizing Provider  acetaminophen (TYLENOL) 500 MG tablet Take 500 mg by mouth at bedtime. pain   Yes Historical Provider, MD  aspirin 81 MG chewable tablet Chew 81 mg by mouth daily.   Yes Historical Provider, MD  clopidogrel (PLAVIX) 75 MG tablet Take 75 mg by mouth daily.    Yes Historical Provider, MD  Dorzolamide HCl-Timolol Mal PF 22.3-6.8 MG/ML SOLN Place 1 drop into both  eyes every 12 (twelve) hours.    Yes Historical Provider, MD  fish oil-omega-3 fatty acids 1000 MG capsule Take 1 g by mouth daily.   Yes Historical Provider, MD  latanoprost (XALATAN) 0.005 % ophthalmic solution Place 1 drop into both eyes at bedtime.   Yes Historical Provider, MD  magnesium hydroxide (MILK OF MAGNESIA) 400 MG/5ML suspension Take 30 mLs by mouth daily as needed for mild constipation.   Yes Historical Provider, MD  metoprolol tartrate (LOPRESSOR) 25 MG tablet Take 25 mg by mouth 2 (two) times daily.   Yes Historical Provider, MD  Multiple Vitamins-Minerals (ICAPS) TABS Take 1 tablet by mouth daily.   Yes Historical Provider, MD  oxyCODONE (ROXICODONE) 5 MG immediate release tablet Take 1 tablet (5 mg total) by mouth every 4 (four) hours as needed for severe pain. 08/28/15  Yes Sharon SellerJessica K Eubanks, NP  pantoprazole (PROTONIX) 40 MG tablet Take 1 tablet (40 mg total) by mouth daily. 10/27/13  Yes Rhonda G Barrett, PA-C  promethazine (PHENERGAN) 25 MG tablet Take 25 mg by mouth every 6 (six) hours as needed for nausea or vomiting.   Yes Historical Provider, MD  furosemide (LASIX) 20 MG tablet Take 1 tablet (20 mg total) by mouth daily as needed for fluid or edema. Patient not taking: Reported on 11/21/2015 08/27/15   Selinda OrionJessica U Vann, DO   BP 107/63 mmHg  Pulse 77  Temp(Src) 97.8 F (36.6 C) (Oral)  Resp 18  Ht 5\' 6"  (1.676 m)  Wt 81.647 kg  BMI 29.07 kg/m2  SpO2 100% Physical Exam  Constitutional: She is oriented to person, place, and time. She appears well-developed and well-nourished. No distress.  Hard of hearing.  HENT:  Head: Normocephalic and atraumatic.  Eyes: Conjunctivae are normal.  Neck: Normal range of motion. Neck supple.  Cardiovascular: Normal rate, regular rhythm and normal heart sounds.   RRR  Pulmonary/Chest: Effort normal and breath sounds normal. No respiratory distress. She has no wheezes. She has no rales.  Lung clear  Abdominal: Soft. There is no tenderness.   No abdominal tenderness to palpation  Musculoskeletal: Normal range of motion.  Neurological: She is alert and oriented to person, place, and time. She is not disoriented. No sensory deficit.  A and O 3. Able to answer questions appropriately. Weak and unable to perform physical requests such as lifting legs.   Skin: Skin is warm and dry.  Nursing note and vitals reviewed.   ED Course  Procedures (including critical care time) Labs Review Labs Reviewed  CBC WITH DIFFERENTIAL/PLATELET - Abnormal; Notable for the following:    Monocytes Absolute 1.2 (*)  All other components within normal limits  BASIC METABOLIC PANEL - Abnormal; Notable for the following:    Chloride 98 (*)    Glucose, Bld 125 (*)    BUN 32 (*)    Creatinine, Ser 2.02 (*)    GFR calc non Af Amer 20 (*)    GFR calc Af Amer 23 (*)    All other components within normal limits  URINE CULTURE  PROTIME-INR  URINALYSIS, ROUTINE W REFLEX MICROSCOPIC (NOT AT [facility])  D-DIMER, QUANTITATIVE (NOT AT [facility])  Rosezena Sensor, ED    Imaging Review Dg Chest 2 View  11/21/2015  CLINICAL DATA:  Chest pain relieved by nitroglycerin and aspirin. EXAM: CHEST  2 VIEW COMPARISON:  08/24/2015 and 04/10/2014) FINDINGS: Stable cardiomegaly, aortic atherosclerosis and ectasia. The lungs appear stable. There is no edema, confluent airspace opacity, pleural effusion or pneumothorax. No acute osseous findings are seen. IMPRESSION: Stable chest with stable cardiomegaly and aortic atherosclerosis. No acute findings. Electronically Signed   By: Carey Bullocks M.D.   On: 11/21/2015 19:17   I have personally reviewed and evaluated these images and lab results as part of my medical decision-making.   EKG Interpretation   Date/Time:  Wednesday November 21 2015 18:03:48 EDT Ventricular Rate:  68 PR Interval:  247 QRS Duration: 145 QT Interval:  467 QTC Calculation: 497 R Axis:   -54 Text Interpretation:  Sinus rhythm Prolonged PR interval  RBBB and LAFB  Baseline wander in lead(s) V4 Confirmed by PICKERING  MD, Harrold Donath [phone])  on 11/21/2015 6:32:16 PM      MDM   Final diagnoses:  Weakness  Chest pain, unspecified chest pain type   Patient presents from nursing home for weakness and fatigue 2 days. Complaining of chest pain and shortness of breath today. Vital signs are stable on initial evaluation. She is weak but able to answer questions.  She has acute on chronic kidney disease. Chest x-ray is negative for edema, pneumothorax, or infiltrate. EKG is not concerning. Troponin negative.  Patient is DO NOT RESUSCITATE. Son and daughter at bedside. Last admission was in January for urinary tract infection. Her incontinence of stool and urine today while laying in bed is most likely due to her weakness. She is a poor historian on details of how she felt over the last 2 days. Some of the information was obtained by nursing home, and her son who visited her yesterday. I discussed this patient with Dr. Rubin Payor.  UA and dimer are pending.  I discussed this patient with Danelle Berry, PA-C regarding pending labs and possible need for admission.   Catha Gosselin, PA-C 11/21/15 2015  Benjiman Core, MD 11/22/15 [phone]

## 2015-11-21 NOTE — Progress Notes (Signed)
Pharmacy Antibiotic Note  [name] [name] is a 82 y.o. female admitted on 11/21/2015 with UTI.  Pharmacy has been consulted for cefepime dosing.  Plan: Cefepime 1g IV Q24H.  Height: 5' 3.5" (161.3 cm) Weight: 166 lb 8 oz (75.524 kg) IBW/kg (Calculated) : 53.55  Temp (24hrs), Avg:97.8 F (36.6 C), Min:97.8 F (36.6 C), Max:97.8 F (36.6 C)   Recent Labs Lab 11/21/15 1847  WBC 10.4  CREATININE 2.02*    Estimated Creatinine Clearance: 16.8 mL/min (by C-G formula based on Cr of 2.02).    Allergies  Allergen Reactions  . Codeine Nausea And Vomiting  . Morphine And Related Nausea And Vomiting  . Penicillins Hives     Thank you for allowing pharmacy to be a part of this patient's care.  Vernard GamblesVeronda Numan Zylstra, PharmD, BCPS  11/21/2015 11:33 PM

## 2015-11-21 NOTE — H&P (Signed)
[facility] History and Physical  KERRINGTON SOVA EAV:[id number] DOB: December 18, 1918 DOA: 11/21/2015  Referring physician: ED physician PCP: Kaleen Mask, MD  Specialists: None listed   Chief Complaint:  General weakness, lethargy, SOB, chest pain   HPI: [name] [name] is a 82 y.o. female with PMH of hypertension, chronic kidney disease stage III, and CAD status post stenting who presents from her SNF with general weakness, lethargy, chest pain, and dyspnea. Patient had reportedly been in her usual state until earlier today when she was noted by staff at her facility to be lethargic and uncharacteristically incontinent of urine and stool. Upon evaluation, she complained of chest pain and a dose of sublingual nitroglycerin was administered by SNF personnel. EMS was then activated for transport to the hospital. Patient reports going to bed the night prior in her usual state of health and denies any recent fever, chills, abdominal pain, or dysuria. She is unable to identify any alleviating or exacerbating factors for her pain and dyspnea. She reported to the staff member that her symptoms were reminiscent of a remote PE.  In ED, patient was found to be afebrile, saturating in the 80s on room air, and with vital signs otherwise stable. Chest x-ray was notable for stable cardiomegaly but negative for acute cardiopulmonary disease. EKG feature to sinus rhythm with first degree AV nodal block, RBBB, and LaFB. CBC was unremarkable and BMP featured a serum creatinine of 2.02, up from an apparent baseline of 1.2. Troponin returned at 0.01. Urine was obtained for analysis and features many bacteria, large leukocytes, positive nitrites, and too numerous to count white blood cells. Patient was provided supplemental oxygen via nasal cannula with recovery of her saturations. She was bolused with 1 L of normal saline, given an empiric dose of Rocephin, and a DuoNeb treatment. She experienced relief of her dyspnea  with the breathing treatment and d-dimer was ordered. D-dimer returned elevated to a value of 3.66 and V/Q scan was ordered. Patient will be admitted to the hospital for ongoing evaluation and management of acute cystitis with AKI, and chest pain with elevated d-dimer concerning for acute PE.  Where does patient live?  SNF     Can patient participate in ADLs?  Yes        Review of Systems:   General: no fevers, chills, sweats, weight change, or poor appetite. Fatigue HEENT: no blurry vision, hearing changes or sore throat Pulm: no wheeze. Dyspnea, cough CV: no palpitations. Chest pain Abd: no nausea, vomiting, abdominal pain, diarrhea, or constipation GU: no dysuria, hematuria, increased urinary frequency, or urgency  Ext: no leg edema Neuro: no focal weakness, numbness, or tingling, no vision change or hearing loss Skin: no rash, no wounds MSK: No muscle spasm, no deformity, no red, hot, or swollen joint Heme: No easy bruising or bleeding Travel history: No recent long distant travel    Allergy:  Allergies  Allergen Reactions  . Codeine Nausea And Vomiting  . Morphine And Related Nausea And Vomiting  . Penicillins Hives    Past Medical History  Diagnosis Date  . Hypertension   . Cataract     both eyes  . Osteoarthritis   . Glaucoma   . Poor circulation   . CAD (coronary artery disease)     a. 02/2001 PTCA & BMS to the OM1;  b. 08/2001 CBA to ISR in OM1, CBA of mid LAD;  c. 02/2002 Failed PCI of mLAD occlusion, LCX nl, patent OM1 stent, RCA dom, 40p,  PDA/PL nl, EF 50% mild antlat HK.  Marland Kitchen Thyroid disease     hypothyroidism, hypothyroidism  . Hypertriglyceridemia   . UTI (urinary tract infection) 08/24/2015  . AKI (acute kidney injury) (HCC) 07/2015    Past Surgical History  Procedure Laterality Date  . Abdominal hysterectomy  1972  . Eye surgery  2007/2008    cataracts  . Cardiac surgery  2001    stent placement  . Hernia repair  2009  . Gallbladder surgery  2009  .  Hip arthroplasty  06/06/2012    Procedure: ARTHROPLASTY BIPOLAR HIP;  Surgeon: Eldred Manges, MD;  Location: [facility];  Service: Orthopedics;  Laterality: Right;  monopolar    Social History:  reports that she has never smoked. She has never used smokeless tobacco. She reports that she does not drink alcohol or use illicit drugs.  Family History:  Family History  Problem Relation Age of Onset  . Arthritis Father   . Stroke Father   . Hypertension Father   . Heart disease Sister   . Heart disease Brother   . Hypertension Brother   . Stroke Brother      Prior to Admission medications   Medication Sig Start Date End Date Taking? Authorizing Provider  acetaminophen (TYLENOL) 500 MG tablet Take 500 mg by mouth at bedtime. pain   Yes Historical Provider, MD  aspirin 81 MG chewable tablet Chew 81 mg by mouth daily.   Yes Historical Provider, MD  clopidogrel (PLAVIX) 75 MG tablet Take 75 mg by mouth daily.    Yes Historical Provider, MD  Dorzolamide HCl-Timolol Mal PF 22.3-6.8 MG/ML SOLN Place 1 drop into both eyes every 12 (twelve) hours.    Yes Historical Provider, MD  fish oil-omega-3 fatty acids 1000 MG capsule Take 1 g by mouth daily.   Yes Historical Provider, MD  latanoprost (XALATAN) 0.005 % ophthalmic solution Place 1 drop into both eyes at bedtime.   Yes Historical Provider, MD  magnesium hydroxide (MILK OF MAGNESIA) 400 MG/5ML suspension Take 30 mLs by mouth daily as needed for mild constipation.   Yes Historical Provider, MD  metoprolol tartrate (LOPRESSOR) 25 MG tablet Take 25 mg by mouth 2 (two) times daily.   Yes Historical Provider, MD  Multiple Vitamins-Minerals (ICAPS) TABS Take 1 tablet by mouth daily.   Yes Historical Provider, MD  oxyCODONE (ROXICODONE) 5 MG immediate release tablet Take 1 tablet (5 mg total) by mouth every 4 (four) hours as needed for severe pain. 08/28/15  Yes Sharon Seller, NP  pantoprazole (PROTONIX) 40 MG tablet Take 1 tablet (40 mg total) by mouth  daily. 10/27/13  Yes Rhonda G Barrett, PA-C  promethazine (PHENERGAN) 25 MG tablet Take 25 mg by mouth every 6 (six) hours as needed for nausea or vomiting.   Yes Historical Provider, MD  furosemide (LASIX) 20 MG tablet Take 1 tablet (20 mg total) by mouth daily as needed for fluid or edema. Patient not taking: Reported on 11/21/2015 08/27/15   Joseph Art, DO    Physical Exam: Filed Vitals:   11/21/15 2100 11/21/15 2130 11/21/15 2200 11/21/15 2230  BP: 143/65 153/67 173/71 137/56  Pulse: 74 71 78 84  Temp:      TempSrc:      Resp: Height:      Weight:      SpO2: 100% 95% 100% 91%   General: Mild respiratory distress with 3-word dyspnea HEENT:  Eyes: PERRL, EOMI, no scleral icterus or conjunctival pallor.       ENT: No discharge from the ears or nose, no pharyngeal ulcers, oral mucosa moist.        Neck: No JVD, no bruit, no appreciable mass Heme: No cervical adenopathy, no pallor Cardiac: S1/S2, RRR, grade III systolic murmur throughout precordium, No gallops or rubs. Pulm: Good air movement bilaterally. Coarse ronchi centrally. Abd: Soft, nondistended, nontender, no rebound pain or gaurding, BS present. Ext: No LE edema bilaterally. 2+DP/PT pulse bilaterally. Musculoskeletal: No gross deformity, no red, hot, swollen joints  Skin: No rashes or wounds on exposed surfaces  Neuro: Alert, oriented to person and place only, cranial nerves II-XII grossly intact. No focal findings Psych: Patient is not overtly psychotic, appropriate mood and affect.  Labs on Admission:  Basic Metabolic Panel:  Recent Labs Lab 11/21/15 1847  NA 136  K 4.7  CL 98*  CO2 26  GLUCOSE 125*  BUN 32*  CREATININE 2.02*  CALCIUM 9.1   Liver Function Tests: No results for input(s): AST, ALT, ALKPHOS, BILITOT, PROT, ALBUMIN in the last 168 hours. No results for input(s): LIPASE, AMYLASE in the last 168 hours. No results for input(s): AMMONIA in the last 168 hours. CBC:  Recent  Labs Lab 11/21/15 1847  WBC 10.4  NEUTROABS 7.2  HGB 12.1  HCT 39.0  MCV 97.7  PLT 218   Cardiac Enzymes: No results for input(s): CKTOTAL, CKMB, CKMBINDEX, TROPONINI in the last 168 hours.  BNP (last 3 results) No results for input(s): BNP in the last 8760 hours.  ProBNP (last 3 results) No results for input(s): PROBNP in the last 8760 hours.  CBG: No results for input(s): GLUCAP in the last 168 hours.  Radiological Exams on Admission: Dg Chest 2 View  11/21/2015  CLINICAL DATA:  Chest pain relieved by nitroglycerin and aspirin. EXAM: CHEST  2 VIEW COMPARISON:  08/24/2015 and 04/10/2014) FINDINGS: Stable cardiomegaly, aortic atherosclerosis and ectasia. The lungs appear stable. There is no edema, confluent airspace opacity, pleural effusion or pneumothorax. No acute osseous findings are seen. IMPRESSION: Stable chest with stable cardiomegaly and aortic atherosclerosis. No acute findings. Electronically Signed   By: Carey BullocksWilliam  Veazey M.D.   On: 11/21/2015 19:17    EKG: Independently reviewed.  Abnormal findings:  Sinus rhythm, first degree AVN block, low-voltage QRS, RBBB, LaFB    Assessment/Plan  1. Chest pain, SOB  - EKG without acute ischemic changes, initial troponin 0.01  - D-dimer obtained by EDP and elevated to 3.66 - CTA precluded by AKI on CKD III and nuc med study ordered  - Pt has apparently refused the V/Q scan (and all meds and lab draws), desiring uninterupted sleep  - Trend trops and repeat EKG if pt allows    2. General weakness, lethargy - Suspected secondary to acute cystitis  - Prior urine culture grew pan-sensitive E coli  - Residence in SNF associated with increased risk for MDR organisms  - Treat empirically with cefepime while awaiting culture data  3. UTI  - Treating empirically with cefepime given residence in SNF and associated risk for MDR organisms  - Follow culture and tailor abx as indicated    4. AKI on CKD stage III  - SCr 2.02 on  admission, up from an apparent baseline of 1.2  - Likely secondary to the acute infection and associated dehydration  - Anticipate improvement with IV hydration  - Repeat chem panel in am    5. CAD  -  LAD known to be occluded based on 2003 cath  - No elevated biomarker or ischemic change on EKG  - Continue medical management with Lopressor, ASA, Plavix   6. Hypertension  - At goal for her age group thus far  - Continue Lopressor    DVT ppx: SQ Heparin     Code Status: DNR Family Communication:  Yes, patient's son and daughter at bed side Disposition Plan: Admit to inpatient   Date of Service 11/21/2015    Briscoe Deutscher, MD [facility] Pager [phone]  If 7PM-7AM, please contact night-coverage [email or web address] Password [email or web address] 11/21/2015, 11:18 PM

## 2015-11-21 NOTE — ED Notes (Signed)
Pt desated to 85 this RN placed pt on 2L of oxygen.

## 2015-11-21 NOTE — ED Provider Notes (Signed)
[name] [name] Apr 28, 1919  Pt is a 82 y/o female, brought to the ER for general weakness, fatigue, found in nursing home incontinent of urine and stool, complained of CP and SOB today, she was given one nitroglycerin by the nursing staff, when EMS arrived blood pressure was soft ~SBP 90's.  She was worked up in the ER by Catha GosselinHanna Patel-Mills, PA-C, with Dr. Rubin PayorPickering attending EDP.   I assumed care at shift change, 8 pm, with UA and d-dimer pending. Labs were pertinent for acute on chronic kidney disease, labs otherwise unremarkable.  CXR negative.  Pt was examined Physical Exam  Constitutional: She is oriented to person, place, and time. She appears well-developed and well-nourished. No distress.  Pale elderly female, NAD  HENT:  Head: Normocephalic.  Eyes: Conjunctivae and EOM are normal. Pupils are equal, round, and reactive to light.  Cardiovascular: Normal rate, normal heart sounds and intact distal pulses.  Exam reveals no gallop and no friction rub.   No murmur heard. Pulmonary/Chest: Effort normal. No respiratory distress. She has wheezes. She has rales. She exhibits no tenderness.  Abdominal: Soft. Bowel sounds are normal. She exhibits no distension. There is no tenderness.  Musculoskeletal:  Tenderness to palpation of bilateral LE, no erythema, no edema  Neurological: She is oriented to person, place, and time. She exhibits normal muscle tone. Coordination normal.  Skin: Skin is warm. No rash noted. She is not diaphoretic. No erythema. There is pallor.  Psychiatric: She has a normal mood and affect.  Nursing note and vitals reviewed.  Remaining labs resulted, UA pertinent for UTI, d-dimer positive - 3.36 Ordered rocephin 1g Case results were discussed with Dr. Rubin PayorPickering, instructed to order VQ scan.  The patient was on 2 L oxygen, via nasal cannula at the time of my exam with 100% SpO2.  She was placed on RA and observed.  Pt was sitting upright, conversing, and was maintaining  oxygen saturation to 96%.  Primary nurse reports she will desaturate when she sleeps.  Given pt cough and expiratory wheeze, will add breathing tx and steroids, and monitor.  9:43 PM NucMed unavailable.  Will call for admission and inquire if admitting team would like VQ scan tonight (if so will call in NucMed tech) if not, pt may receive scan in the AM.  Pt will need to be admitted for acute on chronic renal failure, secondary to UTI, r/o PE.  10:16 PM Unassigned was called for admission, I spoke with Dr. Antionette Charpyd, he requests that we call in the nuc med tech to perform the VQ scan tonight. Hold orders were put in for telemetry. The patient has been coughing quite a bit with exp wheeze, she was given one breathing treatment.  She is currently 95% SPO2 on RA.   EKG pertinent for prolonged QTC, prolonged PR interval, RBBB, otherwise sinus rhythm, initial trop negative.  Results:   EKG Interpretation  Date/Time:  Wednesday November 21 2015 18:03:48 EDT Ventricular Rate:  68 PR Interval:  247 QRS Duration: 145 QT Interval:  467 QTC Calculation: 497 R Axis:   -54 Text Interpretation:  Sinus rhythm Prolonged PR interval RBBB and LAFB Baseline wander in lead(s) V4 Confirmed by PICKERING  MD, NATHAN [phone]([id number]) on 11/21/2015 6:32:16 PM        Results for orders placed or performed during the hospital encounter of 11/21/15  CBC with Differential  Result Value Ref Range   WBC 10.4 4.0 - 10.5 K/uL   RBC 3.99 3.87 - 5.11 MIL/uL  Hemoglobin 12.1 12.0 - 15.0 g/dL   HCT 53.6 64.4 - 03.4 %   MCV 97.7 78.0 - 100.0 fL   MCH 30.3 26.0 - 34.0 pg   MCHC 31.0 30.0 - 36.0 g/dL   RDW 74.2 59.5 - 63.8 %   Platelets 218 150 - 400 K/uL   Neutrophils Relative % 69 %   Neutro Abs 7.2 1.7 - 7.7 K/uL   Lymphocytes Relative 18 %   Lymphs Abs 1.9 0.7 - 4.0 K/uL   Monocytes Relative 12 %   Monocytes Absolute 1.2 (H) 0.1 - 1.0 K/uL   Eosinophils Relative 1 %   Eosinophils Absolute 0.1 0.0 - 0.7 K/uL   Basophils  Relative 0 %   Basophils Absolute 0.0 0.0 - 0.1 K/uL  Basic metabolic panel  Result Value Ref Range   Sodium 136 135 - 145 mmol/L   Potassium 4.7 3.5 - 5.1 mmol/L   Chloride 98 (L) 101 - 111 mmol/L   CO2 26 22 - 32 mmol/L   Glucose, Bld 125 (H) 65 - 99 mg/dL   BUN 32 (H) 6 - 20 mg/dL   Creatinine, Ser 7.56 (H) 0.44 - 1.00 mg/dL   Calcium 9.1 8.9 - 43.3 mg/dL   GFR calc non Af Amer 20 (L) >60 mL/min   GFR calc Af Amer 23 (L) >60 mL/min   Anion gap 12 5 - 15  Urinalysis, Routine w reflex microscopic (not at [facility])  Result Value Ref Range   Color, Urine YELLOW YELLOW   APPearance HAZY (A) CLEAR   Specific Gravity, Urine 1.015 1.005 - 1.030   pH 5.0 5.0 - 8.0   Glucose, UA NEGATIVE NEGATIVE mg/dL   Hgb urine dipstick MODERATE (A) NEGATIVE   Bilirubin Urine NEGATIVE NEGATIVE   Ketones, ur NEGATIVE NEGATIVE mg/dL   Protein, ur NEGATIVE NEGATIVE mg/dL   Nitrite POSITIVE (A) NEGATIVE   Leukocytes, UA LARGE (A) NEGATIVE  Protime-INR  Result Value Ref Range   Prothrombin Time 15.0 11.6 - 15.2 seconds   INR 1.16 0.00 - 1.49  D-dimer, quantitative (not at [facility])  Result Value Ref Range   D-Dimer, Quant 3.66 (H) 0.00 - 0.50 ug/mL-FEU  Urine microscopic-add on  Result Value Ref Range   Squamous Epithelial / LPF 0-5 (A) NONE SEEN   WBC, UA TOO NUMEROUS TO COUNT 0 - 5 WBC/hpf   RBC / HPF 0-5 0 - 5 RBC/hpf   Bacteria, UA MANY (A) NONE SEEN   Casts GRANULAR CAST (A) NEGATIVE  I-Stat Troponin, ED (not at [facility])  Result Value Ref Range   Troponin i, poc 0.01 0.00 - 0.08 ng/mL   Comment 3            10:19 PM  Dr. Antionette Char to see and admit. NucMed tech/Radiology called, will perform VQ scan tonight.  Order previously entered.  Filed Vitals:   11/21/15 2015 11/21/15 2030 11/21/15 2100 11/21/15 2130  BP: 152/81 163/69 143/65 153/67  Pulse: 65 75 74 71  Temp:      TempSrc:      Resp: Height:      Weight:      SpO2: 100% 100% 100% 95%  .  Danelle Berry,  PA-C       Danelle Berry, PA-C 11/24/15 1236  Benjiman Core, MD 11/24/15 1540

## 2015-11-21 NOTE — ED Notes (Signed)
Report attempted x 1

## 2015-11-21 NOTE — ED Notes (Signed)
Soiled pt cleaned and placed in clean dry adult brief.

## 2015-11-21 NOTE — ED Notes (Signed)
Per ems pt was found in bed by nursing staff and stated she was having chest pain. Nursing staff gave 1 nitro tablet and ems adminstered 325mg  ASA in route. On arrival to ED pt has no pain. Pt is alert and ox2.

## 2015-11-22 DIAGNOSIS — A4159 Other Gram-negative sepsis: Secondary | ICD-10-CM | POA: Diagnosis not present

## 2015-11-22 DIAGNOSIS — R531 Weakness: Secondary | ICD-10-CM | POA: Insufficient documentation

## 2015-11-22 DIAGNOSIS — R079 Chest pain, unspecified: Secondary | ICD-10-CM | POA: Insufficient documentation

## 2015-11-22 LAB — TROPONIN I: Troponin I: <0.03 ng/mL (ref <0.031)

## 2015-11-22 LAB — LACTIC ACID, PLASMA
Lactic Acid, Venous: 1.1 mmol/L (ref 0.5–2.0)
Lactic Acid, Venous: 1.2 mmol/L (ref 0.5–2.0)

## 2015-11-22 LAB — PROCALCITONIN

## 2015-11-22 LAB — MRSA PCR SCREENING: MRSA BY PCR: POSITIVE — AB

## 2015-11-22 MED ORDER — MUPIROCIN 2 % EX OINT
1.0000 "application " | TOPICAL_OINTMENT | Freq: Two times a day (BID) | CUTANEOUS | Status: DC
  Administered 2015-11-22 – 2015-11-24 (×5): 1 via NASAL
  Filled 2015-11-22: qty 22

## 2015-11-22 MED ORDER — CHLORHEXIDINE GLUCONATE CLOTH 2 % EX PADS
6.0000 | MEDICATED_PAD | Freq: Every day | CUTANEOUS | Status: DC
  Administered 2015-11-24: 6 via TOPICAL

## 2015-11-22 MED ORDER — SODIUM CHLORIDE 0.9 % IV SOLN
INTRAVENOUS | Status: DC
  Administered 2015-11-22: 10:00:00 via INTRAVENOUS

## 2015-11-22 MED ORDER — CEFTRIAXONE SODIUM 1 G IJ SOLR
1.0000 g | Freq: Once | INTRAMUSCULAR | Status: AC
  Administered 2015-11-22: 1 g via INTRAVENOUS
  Filled 2015-11-22: qty 10

## 2015-11-22 NOTE — Progress Notes (Signed)
Pt admitted from ED with order for VQ scan tonight. Nuclear med tech called in tonight. Pt transported to Nuclear Med immediately after arrival to 3 east, then refused the scan. Pt states, "You all must not know what you're doing" and "I just want to sleep". VQ scan due to be attempted again in the AM. Will continue to monitor.

## 2015-11-22 NOTE — Progress Notes (Signed)
Pt also refusing all meds tonight bc she wants to sleep and has not had any chest pain since arriving to the ED. Will continue to monitor.

## 2015-11-22 NOTE — Progress Notes (Signed)
5:10 PM I agree with HPI/GPe and A/P per Dr. Antionette CharPyd      Patient is alert but not completely oriented although can [place]. She cannot tell me the place [place] remember the number of her daughter  She is refusing some care and has refused the VQ scan. She usually has a good appetite Per daughter no cough specifically when eating  We will Rx as Pyelo We will rpt cbc, cmet in am Cont saline 75cc   Patient Active Problem List   Diagnosis Date Noted  . Pain in the chest   . Weakness   . Acute on chronic renal failure (HCC) 11/21/2015  . Acute respiratory failure with hypoxia (HCC) 11/21/2015  . CKD (chronic kidney disease) stage 3, GFR 30-59 ml/min 11/18/2015  . E. coli UTI (urinary tract infection) 08/28/2015  . GERD (gastroesophageal reflux disease) 08/28/2015  . UTI (lower urinary tract infection) 08/24/2015  . ARF (acute renal failure) (HCC) 08/24/2015  . Cough 09/24/2014  . Pain in joint, ankle and foot 04/15/2014  . Cellulitis 04/10/2014  . Pure hyperglyceridemia 12/22/2013  . Renal insufficiency 11/06/2013  . Chest pain 10/26/2013  . Epigastric pain 10/26/2013  . OA (osteoarthritis) 10/03/2013  . Edema 03/03/2013  . Essential hypertension, benign 01/05/2013  . Anemia 01/05/2013  . Glaucoma 06/06/2012  . CAD (coronary artery disease) 06/05/2012   Pleas KochJai Walaa Carel, MD [facility] [phone]

## 2015-11-22 NOTE — Care Management Obs Status (Signed)
MEDICARE OBSERVATION STATUS NOTIFICATION   Patient Details  Name: [name] [name] MRN: [name] Date of Birth: 06/11/1919   Medicare Observation Status Notification Given:  Yes    Cherrie DistanceChandler, Latorria Zeoli L, RN 11/22/2015, 11:44 AM

## 2015-11-23 DIAGNOSIS — A4159 Other Gram-negative sepsis: Secondary | ICD-10-CM | POA: Diagnosis not present

## 2015-11-23 DIAGNOSIS — J9601 Acute respiratory failure with hypoxia: Secondary | ICD-10-CM | POA: Diagnosis not present

## 2015-11-23 LAB — CBC
HEMATOCRIT: 35.2 % — AB (ref 36.0–46.0)
HEMOGLOBIN: 10.7 g/dL — AB (ref 12.0–15.0)
MCH: 29.6 pg (ref 26.0–34.0)
MCHC: 30.4 g/dL (ref 30.0–36.0)
MCV: 97.5 fL (ref 78.0–100.0)
Platelets: 195 10*3/uL (ref 150–400)
RBC: 3.61 MIL/uL — ABNORMAL LOW (ref 3.87–5.11)
RDW: 14 % (ref 11.5–15.5)
WBC: 5.3 10*3/uL (ref 4.0–10.5)

## 2015-11-23 LAB — BASIC METABOLIC PANEL
ANION GAP: 7 (ref 5–15)
BUN: 31 mg/dL — AB (ref 6–20)
CHLORIDE: 103 mmol/L (ref 101–111)
CO2: 26 mmol/L (ref 22–32)
Calcium: 8.6 mg/dL — ABNORMAL LOW (ref 8.9–10.3)
Creatinine, Ser: 1.77 mg/dL — ABNORMAL HIGH (ref 0.44–1.00)
GFR calc Af Amer: 27 mL/min — ABNORMAL LOW (ref >60)
GFR, EST NON AFRICAN AMERICAN: 23 mL/min — AB (ref >60)
GLUCOSE: 90 mg/dL (ref 65–99)
POTASSIUM: 4.3 mmol/L (ref 3.5–5.1)
Sodium: 136 mmol/L (ref 135–145)

## 2015-11-23 LAB — URINE CULTURE

## 2015-11-23 MED ORDER — SULFAMETHOXAZOLE-TRIMETHOPRIM 800-160 MG PO TABS
1.0000 | ORAL_TABLET | Freq: Two times a day (BID) | ORAL | Status: DC
  Administered 2015-11-23 – 2015-11-24 (×2): 1 via ORAL
  Filled 2015-11-23 (×4): qty 1

## 2015-11-23 MED ORDER — DEXTROSE 5 % IV SOLN
1.0000 g | INTRAVENOUS | Status: DC
  Filled 2015-11-23: qty 10

## 2015-11-23 NOTE — Progress Notes (Signed)
[name] [name]:[id number] DOB: 06-17-1919 DOA: 11/21/2015 PCP: Kaleen Mask, MD  Brief narrative: 82 y/o ? resident [facility] Hip fracture status post right hemiarthroplasty 05/2012 htn Glaucoma CK D stage III CAD-prior MRI 03/05/2001 chronically occluded LAD, last cardiac cath 2003 History of pseudoaneurysm left groin 2002 status post this procedure Prior R foot hematoma Cholecystectomy with complication of biloma? 2009 Remote pulmonary embolism at some point in time?-No records available.   Admitted to hospital for 11/21/15 lethargic + chest pain and incontinent urine/stool Noted hypoxic respiratory failure initially with sats 80 on room air CXR = stable cardiomegaly EKG = first-degree A-V B, RBBB, LAFB VQ scan ordered as d-dimer elevated 3.66 but patient refuses same.   Past medical history-As per Problem list Chart reviewed as below- Independently reviewed  Consultants:  None currently  Procedures:  None  Antibiotics:  Ceftriaxone 3/29  Cultures growing Klebsiella pneumonia in urine   Subjective   More alert and awake but pleasantly confused Multiple family members at bedside They tell me confusion has been going on on and off and worsening for the past year They understand that she is improving however   Objective    Interim History:   Telemetry:    Objective: Filed Vitals:   11/22/15 1250 11/22/15 2113 11/23/15 0507 11/23/15 0800  BP: 105/54 125/55 156/65 163/52  Pulse: 66 64 65 60  Temp: 99 F (37.2 C) 98.7 F (37.1 C) 97.9 F (36.6 C) 97.4 F (36.3 C)  TempSrc: Oral Oral Oral Oral  Resp: Height:      Weight:   74.662 kg (164 lb 9.6 oz)   SpO2: 100% 91% 100% 100%    Intake/Output Summary (Last 24 hours) at 11/23/15 1219 Last data filed at 11/23/15 0900  Gross per 24 hour  Intake    240 ml  Output      0 ml  Net    240 ml    Exam:  General: eomi ncat, bitemporalis wasting Cardiovascular: s1  s2no  m/r/g Respiratory: cta b no tvr no tvf Abdomen:  Soft nt nd no rebound Skin no le edmea Neuro no focal deficit  Data Reviewed: Basic Metabolic Panel:  Recent Labs Lab 11/21/15 1847 11/23/15 0450  NA 136 136  K 4.7 4.3  CL 98* 103  CO2 26 26  GLUCOSE 125* 90  BUN 32* 31*  CREATININE 2.02* 1.77*  CALCIUM 9.1 8.6*   Liver Function Tests: No results for input(s): AST, ALT, ALKPHOS, BILITOT, PROT, ALBUMIN in the last 168 hours. No results for input(s): LIPASE, AMYLASE in the last 168 hours. No results for input(s): AMMONIA in the last 168 hours. CBC:  Recent Labs Lab 11/21/15 1847 11/23/15 0450  WBC 10.4 5.3  NEUTROABS 7.2  --   HGB 12.1 10.7*  HCT 39.0 35.2*  MCV 97.7 97.5  PLT 218 195   Cardiac Enzymes:  Recent Labs Lab 11/22/15 0304 11/22/15 0709  TROPONINI <0.03 <0.03   BNP: Invalid input(s): POCBNP CBG: No results for input(s): GLUCAP in the last 168 hours.  Recent Results (from the past 240 hour(s))  Urine culture     Status: None   Collection Time: 11/21/15  8:27 PM  Result Value Ref Range Status   Specimen Description URINE, CATHETERIZED  Final   Special Requests NONE  Final   Culture >=100,000 COLONIES/mL KLEBSIELLA PNEUMONIAE  Final   Report Status 11/23/2015 FINAL  Final   Organism ID, Bacteria KLEBSIELLA PNEUMONIAE  Final  Susceptibility   Klebsiella pneumoniae - MIC*    AMPICILLIN 16 RESISTANT Resistant     CEFAZOLIN <=4 SENSITIVE Sensitive     CEFTRIAXONE <=1 SENSITIVE Sensitive     CIPROFLOXACIN <=0.25 SENSITIVE Sensitive     GENTAMICIN <=1 SENSITIVE Sensitive     IMIPENEM <=0.25 SENSITIVE Sensitive     NITROFURANTOIN 64 INTERMEDIATE Intermediate     TRIMETH/SULFA <=20 SENSITIVE Sensitive     AMPICILLIN/SULBACTAM 4 SENSITIVE Sensitive     PIP/TAZO <=4 SENSITIVE Sensitive     * >=100,000 COLONIES/mL KLEBSIELLA PNEUMONIAE  MRSA PCR Screening     Status: Abnormal   Collection Time: 11/21/15 11:33 PM  Result Value Ref Range Status    MRSA by PCR POSITIVE (A) NEGATIVE Final    Comment:        The GeneXpert MRSA Assay (FDA approved for NASAL specimens only), is one component of a comprehensive MRSA colonization surveillance program. It is not intended to diagnose MRSA infection nor to guide or monitor treatment for MRSA infections. RESULT CALLED TO, READ BACK BY AND VERIFIED WITH: D HART,RN  11/22/15 MKELLY      Studies:              All Imaging reviewed and is as per above notation   Scheduled Meds: . acetaminophen  500 mg Oral QHS  . aspirin  81 mg Oral Daily  . cefTRIAXone (ROCEPHIN)  IV  1 g Intravenous Q24H  . Chlorhexidine Gluconate Cloth  6 each Topical Q0600  . clopidogrel  75 mg Oral Daily  . dorzolamide-timolol  1 drop Both Eyes BID  . heparin  5,000 Units Subcutaneous 3 times per day  . latanoprost  1 drop Both Eyes QHS  . metoprolol tartrate  25 mg Oral BID  . multivitamin  1 tablet Oral Daily  . mupirocin ointment  1 application Nasal BID  . omega-3 acid ethyl esters  1 g Oral Daily  . pantoprazole  40 mg Oral Daily   Continuous Infusions: . sodium chloride 75 mL/hr at 11/22/15 0930     Assessment/Plan:  1. Toxic metabolic encephalopathy-pyelonephritis Klebsiella superimposed on underlying slowly progressive dementia- 2. Sepsis secondary to Klebsiella pyelonephritis-treat the treatable, received Rocephin initially-->Bactrim DS 11/23/2015. Saline lock IV fluid. Hemodynamically improved 3. CAD with history of MI 2002 chronically occluded LAD-medical management only. Continue metoprolol 25 twice a day, aspirin 81 daily, Plavix 75 daily 4. Chronic kidney disease stage III-avoid nephrotoxins ACE inhibitor-euvolemic 5. Remote pulmonary embolism?-Currently stable. Would not pursue VQ scan despite elevated d-dimer-patient hemodynamically stable and low pretest probability for actual embolus and would not change management  Long discussion 11/23/15 with multiple family members including 3  daughters and her son. Patient has had overall decline over the past year and I'm discussed trajectory of care and life expectancy in a patient with suspected dementia. I would estimate her MMSE is being at least 18 if not lower. I expect that she will survive this hospitalization but she will probably continue to decline. She lives a [facility] [employer] nursing facility and family curious as to what palliative care services would offer a From my perspective there may be a benefit associated with palliative care in terms of some supervision. This can be delineated further by palliative medicine and solidified in terms of goals of care. If she remains stable over the next 1-2 days she'll be discharged back [facility] nursing facility probably on 8/2 or/317      Pleas Koch, MD  [facility] Pager  [phone] 11/23/2015, 12:19 PM

## 2015-11-24 DIAGNOSIS — J9601 Acute respiratory failure with hypoxia: Secondary | ICD-10-CM

## 2015-11-24 DIAGNOSIS — Z515 Encounter for palliative care: Secondary | ICD-10-CM | POA: Insufficient documentation

## 2015-11-24 DIAGNOSIS — A4159 Other Gram-negative sepsis: Secondary | ICD-10-CM | POA: Diagnosis not present

## 2015-11-24 LAB — COMPREHENSIVE METABOLIC PANEL
ALBUMIN: 2.9 g/dL — AB (ref 3.5–5.0)
ALK PHOS: 66 U/L (ref 38–126)
ALT: 22 U/L (ref 14–54)
ANION GAP: 11 (ref 5–15)
AST: 48 U/L — ABNORMAL HIGH (ref 15–41)
BUN: 20 mg/dL (ref 6–20)
CALCIUM: 8.6 mg/dL — AB (ref 8.9–10.3)
CO2: 23 mmol/L (ref 22–32)
CREATININE: 1.31 mg/dL — AB (ref 0.44–1.00)
Chloride: 102 mmol/L (ref 101–111)
GFR calc Af Amer: 39 mL/min — ABNORMAL LOW (ref >60)
GFR calc non Af Amer: 34 mL/min — ABNORMAL LOW (ref >60)
GLUCOSE: 79 mg/dL (ref 65–99)
Potassium: 3.9 mmol/L (ref 3.5–5.1)
SODIUM: 136 mmol/L (ref 135–145)
Total Bilirubin: 0.6 mg/dL (ref 0.3–1.2)
Total Protein: 6.7 g/dL (ref 6.5–8.1)

## 2015-11-24 LAB — CBC
HCT: 36.7 % (ref 36.0–46.0)
HEMOGLOBIN: 11.4 g/dL — AB (ref 12.0–15.0)
MCH: 29.7 pg (ref 26.0–34.0)
MCHC: 31.1 g/dL (ref 30.0–36.0)
MCV: 95.6 fL (ref 78.0–100.0)
Platelets: 199 10*3/uL (ref 150–400)
RBC: 3.84 MIL/uL — ABNORMAL LOW (ref 3.87–5.11)
RDW: 13.8 % (ref 11.5–15.5)
WBC: 4.5 10*3/uL (ref 4.0–10.5)

## 2015-11-24 MED ORDER — ONDANSETRON 4 MG PO TBDP: 4.0000 mg | ORAL_TABLET | Freq: Three times a day (TID) | ORAL | Status: AC | PRN

## 2015-11-24 MED ORDER — ONDANSETRON HCL 4 MG/2ML IJ SOLN: 4.0000 mg | Freq: Four times a day (QID) | INTRAMUSCULAR | Status: DC | PRN

## 2015-11-24 MED ORDER — FUROSEMIDE 20 MG PO TABS: 20.0000 mg | ORAL_TABLET | ORAL | Status: DC

## 2015-11-24 MED ORDER — SULFAMETHOXAZOLE-TRIMETHOPRIM 800-160 MG PO TABS: 1.0000 | ORAL_TABLET | Freq: Two times a day (BID) | ORAL | Status: DC

## 2015-11-24 NOTE — Progress Notes (Signed)
Mental status much ?   BUT Episodic vomit this am rx Zofran Await resolution-if stable d/w family that might be able to d/c to SNF TODAY if no other issues.    Pleas KochJai Jazariah Teall, MD [facility] [phone]

## 2015-11-24 NOTE — Progress Notes (Signed)
CSW went to speak with family. CSW introduced self and acknowledged the patient. Patient was disoriented and appeared to be restless. Patient's daughter Diane at bedside. CSW spoke with daughter regarding patient's residency. Patient is a [facility]. Family informed CSW that they are no concerns regarding the patient returning back. Daughter expressed that she would like to make sure that the facility can provide the level of care her mother will need. CSW informed daughter that clinical information will be sent out to facility, and will follow up regarding any concerns. Daughter voiced understanding.   CSW contacted facility to inform of patient's status. Per Lowella BandyNikki, patient can return once medically stable and ready for discharge. CSW to inform if discharged on today. CSW completed FL2 and faxed over to facility.   CSW will continue to follow and provide support to patient and family while in the hospital.   Fernande BoydenJoyce Rox Mcgriff, [facility]WA Clinical Social Worker [facility] Ph: [phone]

## 2015-11-24 NOTE — [place] FL2 (Signed)
[place] MEDICAID FL2 LEVEL OF CARE SCREENING TOOL     IDENTIFICATION  Patient Name: [name] [name] Birthdate: 1918/10/20 Sex: female Admission Date (Current Location): 11/21/2015  [facility] [place]   Facility and Address:  [facility], [address], [place], [place] [address]      Provider Number: [id number]  Attending Physician Name and Address:  Rhetta MuraJai-Gurmukh Samtani, MD  Relative Name and Phone Number:       Current Level of Care: Hospital Recommended Level of Care: Skilled Nursing Facility Prior Approval Number:    Date Approved/Denied:   PASRR Number:    Discharge Plan: SNF    Current Diagnoses: Patient Active Problem List   Diagnosis Date Noted  . Pain in the chest   . Weakness   . Acute on chronic renal failure (HCC) 11/21/2015  . Acute respiratory failure with hypoxia (HCC) 11/21/2015  . CKD (chronic kidney disease) stage 3, GFR 30-59 ml/min 11/18/2015  . E. coli UTI (urinary tract infection) 08/28/2015  . GERD (gastroesophageal reflux disease) 08/28/2015  . UTI (lower urinary tract infection) 08/24/2015  . ARF (acute renal failure) (HCC) 08/24/2015  . Cough 09/24/2014  . Pain in joint, ankle and foot 04/15/2014  . Cellulitis 04/10/2014  . Pure hyperglyceridemia 12/22/2013  . Renal insufficiency 11/06/2013  . Chest pain 10/26/2013  . Epigastric pain 10/26/2013  . OA (osteoarthritis) 10/03/2013  . Edema 03/03/2013  . Essential hypertension, benign 01/05/2013  . Anemia 01/05/2013  . Glaucoma 06/06/2012  . CAD (coronary artery disease) 06/05/2012    Orientation RESPIRATION BLADDER Height & Weight     Self, Situation, Place  O2 (2 Lo2) Incontinent Weight: 163 lb 6.4 oz (74.118 kg) Height:  5' 3.5" (161.3 cm)  BEHAVIORAL SYMPTOMS/MOOD NEUROLOGICAL BOWEL NUTRITION STATUS      Continent Diet  AMBULATORY STATUS COMMUNICATION OF NEEDS Skin   Extensive Assist Verbally Normal                        Personal Care Assistance Level of Assistance  Bathing, Feeding, Dressing Bathing Assistance: Maximum assistance Feeding assistance: Limited assistance Dressing Assistance: Maximum assistance     Functional Limitations Info  Sight, Hearing, Speech Sight Info: Impaired Hearing Info: Adequate Speech Info: Adequate    SPECIAL CARE FACTORS FREQUENCY  PT (By licensed PT), OT (By licensed OT)                    Contractures      Additional Factors Info  Code Status, Allergies Code Status Info: DNR Allergies Info: Codeine, Morphine And Related, Penicillins           Current Medications (11/24/2015):  This is the current hospital active medication list Current Facility-Administered Medications  Medication Dose Route Frequency Provider Last Rate Last Dose  . acetaminophen (TYLENOL) tablet 500 mg  500 mg Oral QHS Briscoe Deutscherimothy S Opyd, MD   500 mg at 11/23/15 2059  . acetaminophen (TYLENOL) tablet 650 mg  650 mg Oral Q4H PRN Briscoe Deutscherimothy S Opyd, MD   650 mg at 11/24/15 0951  . aspirin chewable tablet 81 mg  81 mg Oral Daily Briscoe Deutscherimothy S Opyd, MD   81 mg at 11/24/15 0951  . Chlorhexidine Gluconate Cloth 2 % PADS 6 each  6 each Topical Q0600 Briscoe Deutscherimothy S Opyd, MD   6 each at 11/24/15 0518  . clopidogrel (PLAVIX) tablet 75 mg  75 mg Oral Daily Timothy S Opyd,  MD   75 mg at 11/24/15 0951  . dorzolamide-timolol (COSOPT) 22.3-6.8 MG/ML ophthalmic solution 1 drop  1 drop Both Eyes BID Briscoe Deutscher, MD   1 drop at 11/24/15 0953  . heparin injection 5,000 Units  5,000 Units Subcutaneous 3 times per day Briscoe Deutscher, MD   5,000 Units at 11/24/15 0518  . latanoprost (XALATAN) 0.005 % ophthalmic solution 1 drop  1 drop Both Eyes QHS Briscoe Deutscher, MD   1 drop at 11/23/15 2102  . magnesium hydroxide (MILK OF MAGNESIA) suspension 30 mL  30 mL Oral Daily PRN Briscoe Deutscher, MD      . metoprolol tartrate (LOPRESSOR) tablet 25 mg  25 mg Oral BID Briscoe Deutscher, MD   25 mg at 11/24/15 1000  . multivitamin  (PROSIGHT) tablet 1 tablet  1 tablet Oral Daily Briscoe Deutscher, MD   1 tablet at 11/24/15 1000  . mupirocin ointment (BACTROBAN) 2 % 1 application  1 application Nasal BID Briscoe Deutscher, MD   1 application at 11/24/15 [phone]  . omega-3 acid ethyl esters (LOVAZA) capsule 1 g  1 g Oral Daily Briscoe Deutscher, MD   1 g at 11/24/15 0951  . ondansetron (ZOFRAN) injection 4 mg  4 mg Intravenous Q6H PRN Briscoe Deutscher, MD   4 mg at 11/24/15 1110  . oxyCODONE (Oxy IR/ROXICODONE) immediate release tablet 5 mg  5 mg Oral Q4H PRN Briscoe Deutscher, MD      . pantoprazole (PROTONIX) EC tablet 40 mg  40 mg Oral Daily Briscoe Deutscher, MD   40 mg at 11/24/15 0951  . promethazine (PHENERGAN) tablet 25 mg  25 mg Oral Q6H PRN Briscoe Deutscher, MD      . sulfamethoxazole-trimethoprim (BACTRIM DS,SEPTRA DS) 800-160 MG per tablet 1 tablet  1 tablet Oral Q12H Rhetta Mura, MD   1 tablet at 11/24/15 1000     Discharge Medications: Please see discharge summary for a list of discharge medications.  Relevant Imaging Results:  Relevant Lab Results:   Additional Information SS#: [id number]  Loleta Dicker, LCSW

## 2015-11-24 NOTE — Progress Notes (Signed)
CM called [name] [name] with SW to advise of need for assistance with discharge planning. Cm paged Dr. Mahala MenghiniSamtani for assistance with discharge. SW to follow.

## 2015-11-24 NOTE — Discharge Summary (Signed)
Pt is going to be discharged back to [facility], Report provided to the receiving nurse, IV taken off, Tele-monitor DC, helped pt to get changed, pt is waiting for a ambulance at this moment, Daughter is in bedside.

## 2015-11-24 NOTE — Progress Notes (Signed)
Consulted to review with daughter what resources, in terms of palliative care vs. Hospice her mother could access. Pt scheduled for DC back to [facility] today Met with pt's daughter briefly to discuss what Palliative Care could offer in the nursing home. Daughter shares that she is looking for assistance in feeding her mother, filling out her menus and sitting with her mother. Daughter feeling overwhelmed with her mother's slow decline, increased needs. We discussed palliative care vs. Hospice care. After discussion, daughter feels that hospice could provide more services and is going to speak with the SW at the facility. I did educate her on how to access services, that she could self refer. We also spent some time talking about the disease trajectory and progression of dementia. Romona Curls, ANP-ACHPN

## 2015-11-24 NOTE — Progress Notes (Signed)
CSW received call from MD stating that patient is stable for d/c today back [facility] for continued care and Palliative follow up.  Notified Nicki- Admissions at facility who stated that patient's bed is still available.  Patient's daughter notified and is agreeable to return to facility with Palliative. Patient is aware of d/c plan.  Nursing will call report to facility.  No further CSW needs identified. CSW signing off.  Lorri Frederickonna T. Jaci LazierCrowder, [place] [phone] (weekend coverage)

## 2015-11-24 NOTE — Discharge Summary (Signed)
Physician Discharge Summary  [name] [name] ZOX:[id number] DOB: 03-May-1919 DOA: 11/21/2015  PCP: Kaleen Mask, MD  Admit date: 11/21/2015 Discharge date: 11/24/2015  Time spent: 40 minutes  Recommendations for Outpatient Follow-up:  1. Recommend COMFORT trajectory 2. Please ask palliative care to delineate GOC at [facility] 3. Simplify as needed the meds as OP-i have already d/c her vitamins, and othe rmeds 4. COmplete Bactrim DS another 2 days for   Discharge Diagnoses:  Principal Problem:   Acute respiratory failure with hypoxia (HCC) Active Problems:   CAD (coronary artery disease)   Essential hypertension, benign   Chest pain   UTI (lower urinary tract infection)   Acute on chronic renal failure (HCC)   Pain in the chest   Weakness   Discharge Condition: gaurded  Diet recommendation: comfort feeds suggested  Filed Weights   11/22/15 0300 11/23/15 0507 11/24/15 0618  Weight: 76.068 kg (167 lb 11.2 oz) 74.662 kg (164 lb 9.6 oz) 74.118 kg (163 lb 6.4 oz)    History of present illness:  82 y/o ? resident [facility] Hip fracture status post right hemiarthroplasty 05/2012 htn Glaucoma CK D stage III CAD-prior MRI 03/05/2001 chronically occluded LAD, last cardiac cath 2003 History of pseudoaneurysm left groin 2002 status post this procedure Prior R foot hematoma Cholecystectomy with complication of biloma? 2009 Remote pulmonary embolism at some point in time?-No records available.   Admitted to hospital for 11/21/15 lethargic + chest pain and incontinent urine/stool Noted hypoxic respiratory failure initially with sats 80 on room air CXR = stable cardiomegaly EKG = first-degree A-V B, RBBB, LAFB VQ scan ordered as d-dimer elevated 3.66 but patient refuses same.      Toxic metabolic encephalopathy-pyelonephritis Klebsiella superimposed on underlying slowly progressive dementia- 1. Sepsis secondary to Klebsiella pyelonephritis-treat the treatable, received  Rocephin initially-->Bactrim DS 11/23/2015--to complete on 11/26/15 .Hemodynamically improved 2. CAD with history of MI 2002 chronically occluded LAD-medical management only. Continue metoprolol 25 twice a day, aspirin 81 daily, Plavix 75 daily  3. Chronic kidney disease stage III-avoid nephrotoxins ACE inhibitor-euvolemic 4. Remote pulmonary embolism?-Currently stable. Would not pursue VQ scan despite elevated d-dimer-patient hemodynamically stable and low pretest probability for actual embolus and would not change management  Long discussion 11/23/15, 11/24/15 with multiple family members including 3 daughters and her son. Patient has had overall decline over the past year and I'm discussed trajectory of care and life expectancy in a patient with suspected dementia. I would estimate her MMSE is being at least 18 if not lower. I expect that she will survive this hospitalization but she will probably continue to decline. [facility] lives [facility] and family curious as to what palliative care services would offer a From my perspective there may be a benefit associated with palliative care in terms of some supervision.   Past medical history-As per Problem list Chart reviewed as below- Independently reviewed  Consultants: none currently  Procedures:  None  Antibiotics:  Ceftriaxone 3/29  Cultures growing Klebsiella pneumonia in urine       Discharge Exam: Filed Vitals:   11/24/15 1000 11/24/15 1232  BP: 141/58 140/66  Pulse: 67 61  Temp:  98 F (36.7 C)  Resp:      General: eomi ncat  Cardiovascular: s1 s2 no m/r/g  Respiratory:  clear  Discharge Instructions   Discharge Instructions    Diet - low sodium heart healthy    Complete by:  As directed      Discharge to  SNF when bed available    Complete by:  As directed      Increase activity slowly    Complete by:  As directed           Current Discharge Medication List    START taking these medications    Details  ondansetron (ZOFRAN ODT) 4 MG disintegrating tablet Take 1 tablet (4 mg total) by mouth every 8 (eight) hours as needed for nausea or vomiting. Qty: 20 tablet, Refills: 0    sulfamethoxazole-trimethoprim (BACTRIM DS,SEPTRA DS) 800-160 MG tablet Take 1 tablet by mouth every 12 (twelve) hours. Qty: 4 tablet, Refills: 0      CONTINUE these medications which have CHANGED   Details  furosemide (LASIX) 20 MG tablet Take 1 tablet (20 mg total) by mouth every other day. Qty: 30 tablet      CONTINUE these medications which have NOT CHANGED   Details  acetaminophen (TYLENOL) 500 MG tablet Take 500 mg by mouth at bedtime. pain    aspirin 81 MG chewable tablet Chew 81 mg by mouth daily.    clopidogrel (PLAVIX) 75 MG tablet Take 75 mg by mouth daily.     Dorzolamide HCl-Timolol Mal PF 22.3-6.8 MG/ML SOLN Place 1 drop into both eyes every 12 (twelve) hours.     latanoprost (XALATAN) 0.005 % ophthalmic solution Place 1 drop into both eyes at bedtime.    magnesium hydroxide (MILK OF MAGNESIA) 400 MG/5ML suspension Take 30 mLs by mouth daily as needed for mild constipation.    metoprolol tartrate (LOPRESSOR) 25 MG tablet Take 25 mg by mouth 2 (two) times daily.    Multiple Vitamins-Minerals (ICAPS) TABS Take 1 tablet by mouth daily.    oxyCODONE (ROXICODONE) 5 MG immediate release tablet Take 1 tablet (5 mg total) by mouth every 4 (four) hours as needed for severe pain. Qty: 180 tablet, Refills: 0    pantoprazole (PROTONIX) 40 MG tablet Take 1 tablet (40 mg total) by mouth daily. Qty: 30 tablet, Refills: 11      STOP taking these medications     fish oil-omega-3 fatty acids 1000 MG capsule      promethazine (PHENERGAN) 25 MG tablet        Allergies  Allergen Reactions  . Codeine Nausea And Vomiting  . Morphine And Related Nausea And Vomiting  . Penicillins Hives   Follow-up Information    Follow up with [facility] .   Specialty:  Skilled  Nursing Facility   Contact information:   [address] [place] [address] [phone]       The results of significant diagnostics from this hospitalization (including imaging, microbiology, ancillary and laboratory) are listed below for reference.    Significant Diagnostic Studies: Dg Chest 2 View  11/21/2015  CLINICAL DATA:  Chest pain relieved by nitroglycerin and aspirin. EXAM: CHEST  2 VIEW COMPARISON:  08/24/2015 and 04/10/2014) FINDINGS: Stable cardiomegaly, aortic atherosclerosis and ectasia. The lungs appear stable. There is no edema, confluent airspace opacity, pleural effusion or pneumothorax. No acute osseous findings are seen. IMPRESSION: Stable chest with stable cardiomegaly and aortic atherosclerosis. No acute findings. Electronically Signed   By: Carey Bullocks M.D.   On: 11/21/2015 19:17    Microbiology: Recent Results (from the past 240 hour(s))  Urine culture     Status: None   Collection Time: 11/21/15  8:27 PM  Result Value Ref Range Status   Specimen Description URINE, CATHETERIZED  Final   Special  Requests NONE  Final   Culture >=100,000 COLONIES/mL KLEBSIELLA PNEUMONIAE  Final   Report Status 11/23/2015 FINAL  Final   Organism ID, Bacteria KLEBSIELLA PNEUMONIAE  Final      Susceptibility   Klebsiella pneumoniae - MIC*    AMPICILLIN 16 RESISTANT Resistant     CEFAZOLIN <=4 SENSITIVE Sensitive     CEFTRIAXONE <=1 SENSITIVE Sensitive     CIPROFLOXACIN <=0.25 SENSITIVE Sensitive     GENTAMICIN <=1 SENSITIVE Sensitive     IMIPENEM <=0.25 SENSITIVE Sensitive     NITROFURANTOIN 64 INTERMEDIATE Intermediate     TRIMETH/SULFA <=20 SENSITIVE Sensitive     AMPICILLIN/SULBACTAM 4 SENSITIVE Sensitive     PIP/TAZO <=4 SENSITIVE Sensitive     * >=100,000 COLONIES/mL KLEBSIELLA PNEUMONIAE  MRSA PCR Screening     Status: Abnormal   Collection Time: 11/21/15 11:33 PM  Result Value Ref Range Status   MRSA by PCR POSITIVE (A) NEGATIVE Final    Comment:         The GeneXpert MRSA Assay (FDA approved for NASAL specimens only), is one component of a comprehensive MRSA colonization surveillance program. It is not intended to diagnose MRSA infection nor to guide or monitor treatment for MRSA infections. RESULT CALLED TO, READ BACK BY AND VERIFIED WITH: D HART,RN @0352  11/22/15 MKELLY      Labs: Basic Metabolic Panel:  Recent Labs Lab 11/21/15 1847 11/23/15 0450 11/24/15 0353  NA 136 136 136  K 4.7 4.3 3.9  CL 98* 103 102  CO2 26 26 23   GLUCOSE 125* 90 79  BUN 32* 31* 20  CREATININE 2.02* 1.77* 1.31*  CALCIUM 9.1 8.6* 8.6*   Liver Function Tests:  Recent Labs Lab 11/24/15 0353  AST 48*  ALT 22  ALKPHOS 66  BILITOT 0.6  PROT 6.7  ALBUMIN 2.9*   No results for input(s): LIPASE, AMYLASE in the last 168 hours. No results for input(s): AMMONIA in the last 168 hours. CBC:  Recent Labs Lab 11/21/15 1847 11/23/15 0450 11/24/15 0353  WBC 10.4 5.3 4.5  NEUTROABS 7.2  --   --   HGB 12.1 10.7* 11.4*  HCT 39.0 35.2* 36.7  MCV 97.7 97.5 95.6  PLT 218 195 199   Cardiac Enzymes:  Recent Labs Lab 11/22/15 0304 11/22/15 0709  TROPONINI <0.03 <0.03   BNP: BNP (last 3 results) No results for input(s): BNP in the last 8760 hours.  ProBNP (last 3 results) No results for input(s): PROBNP in the last 8760 hours.  CBG: No results for input(s): GLUCAP in the last 168 hours.     SignedRhetta Mura:  Salam Chesterfield, JAI-GURMUKH MD   [facility] 11/24/2015, 3:53 PM

## 2015-11-26 ENCOUNTER — Non-Acute Institutional Stay (SKILLED_NURSING_FACILITY): Payer: Medicare Other | Admitting: Internal Medicine

## 2015-11-26 DIAGNOSIS — I2510 Atherosclerotic heart disease of native coronary artery without angina pectoris: Secondary | ICD-10-CM | POA: Diagnosis not present

## 2015-11-26 DIAGNOSIS — N390 Urinary tract infection, site not specified: Secondary | ICD-10-CM

## 2015-11-26 DIAGNOSIS — I2584 Coronary atherosclerosis due to calcified coronary lesion: Secondary | ICD-10-CM

## 2015-11-26 DIAGNOSIS — N289 Disorder of kidney and ureter, unspecified: Secondary | ICD-10-CM

## 2015-11-26 DIAGNOSIS — I10 Essential (primary) hypertension: Secondary | ICD-10-CM | POA: Diagnosis not present

## 2015-11-26 NOTE — Progress Notes (Signed)
[facility] Room Number: 306-W Place of Service:  SNF [phone] Provider:   Kaleen MaskELKINS,WILSON OLIVER, MD  Patient Care Team: Kaleen MaskWilson Oliver Elkins, MD as PCP - General (Family Medicine)  Extended Emergency Contact Information Primary Emergency Contact: Hall BusingWicker,Theresa Address: [address]          [place], [place] [id number] [place] Home Phone: [phone] Relation: Daughter Secondary Emergency Contact: Garner,Cathy Address: [phone]          [place] [id number] [place] Home Phone: [phone] Work Phone: [phone] Relation: Daughter  Code Status: DNR Goals of care: Advanced Directive information Advanced Directives 11/26/2015  Does patient have an advance directive? No  Type of Advance Directive Out of facility DNR (pink MOST or yellow form)  Does patient want to make changes to advanced directive? -  Copy of advanced directive(s) in chart? -     Chief Complaint  Patient presents with  . Readmit To SNF    HPI:  Pt is a 82 y.o. female seen today for a hospital f/u s/p admissionFrom March 29 until 11/24/2015.  Patient was found facility to be lethargic with chest pain and hypoxic.  She was with admitted to the hospital and found to have sepsis secondary to Klebsiella UTI.  She received Rocephin initially this has been switched to Bactrim which she will complete on April 3.  She also has a history coronary artery disease with a history of MI and 2002 and chronically occluded LAD-with medical management only.  She was discharged on metoprolol aspirin and Plavix.  She also had an elevated d-dimer in the hospital at 3.66-there was consideration of a remote pulmonary embolism?-She was stable was thought not to pursue a VQ scan secondary she was hemodynamically stable with a low pretest probability fracture embolus-and would not really change management.  Medical staff did have a discussion  with family members about patient's overall decline over the past year-it was expected that she would continue to decline-recommendation is for palliative care.  I did speak with one of her daughters today and they are in agreement with this she expressed understanding that she has declined fairly significantly year over a period of time and their emphasis appears to be more on comfort.  Currently patient has no acute complaints she is resting in bed comfortably appears somewhat weaker than I have seen her before-- vital signs are stable   Past Medical History  Diagnosis Date  . Hypertension   . Cataract     both eyes  . Osteoarthritis   . Glaucoma   . Poor circulation   . CAD (coronary artery disease)     a. 02/2001 PTCA & BMS to the OM1;  b. 08/2001 CBA to ISR in OM1, CBA of mid LAD;  c. 02/2002 Failed PCI of mLAD occlusion, LCX nl, patent OM1 stent, RCA dom, 40p, PDA/PL nl, EF 50% mild antlat HK.  Marland Kitchen. Thyroid disease     hypothyroidism, hypothyroidism  . Hypertriglyceridemia   . UTI (urinary tract infection) 08/24/2015  . AKI (acute kidney injury) (HCC) 07/2015   Past Surgical History  Procedure Laterality Date  . Abdominal hysterectomy  1972  . Eye surgery  2007/2008    cataracts  . Cardiac surgery  2001    stent placement  . Hernia repair  2009  . Gallbladder surgery  2009  . Hip arthroplasty  06/06/2012    Procedure: ARTHROPLASTY BIPOLAR HIP;  Surgeon: Veverly FellsMark C  Ophelia Charter, MD;  Location: [facility] ORS;  Service: Orthopedics;  Laterality: Right;  monopolar    Allergies  Allergen Reactions  . Codeine Nausea And Vomiting  . Morphine And Related Nausea And Vomiting  . Penicillins Hives      Medication List       This list is accurate as of: 11/26/15  2:55 PM.  Always use your most recent med list.               acetaminophen 500 MG tablet  Commonly known as:  TYLENOL  Take 500 mg by mouth at bedtime. pain     aspirin 81 MG chewable tablet  Chew 81 mg by mouth daily.      clopidogrel 75 MG tablet  Commonly known as:  PLAVIX  Take 75 mg by mouth daily.     Dorzolamide HCl-Timolol Mal PF 22.3-6.8 MG/ML Soln  Place 1 drop into both eyes every 12 (twelve) hours.     furosemide 20 MG tablet  Commonly known as:  LASIX  Take 1 tablet (20 mg total) by mouth every other day.     ICAPS Tabs  Take 1 tablet by mouth daily.     latanoprost 0.005 % ophthalmic solution  Commonly known as:  XALATAN  Place 1 drop into both eyes at bedtime.     magnesium hydroxide 400 MG/5ML suspension  Commonly known as:  MILK OF MAGNESIA  Take 30 mLs by mouth daily as needed for mild constipation.     metoprolol tartrate 25 MG tablet  Commonly known as:  LOPRESSOR  Take 25 mg by mouth 2 (two) times daily.     ondansetron 4 MG disintegrating tablet  Commonly known as:  ZOFRAN ODT  Take 1 tablet (4 mg total) by mouth every 8 (eight) hours as needed for nausea or vomiting.     oxyCODONE 5 MG immediate release tablet  Commonly known as:  ROXICODONE  Take 1 tablet (5 mg total) by mouth every 4 (four) hours as needed for severe pain.     pantoprazole 40 MG tablet  Commonly known as:  PROTONIX  Take 1 tablet (40 mg total) by mouth daily.     sulfamethoxazole-trimethoprim 800-160 MG tablet  Commonly known as:  BACTRIM DS,SEPTRA DS  Take 1 tablet by mouth every 12 (twelve) hours.        Review of Systems   Somewhat limited since patient is a poor historian.  In general does not complaining of fever or chills says she feels weak.  Skin does not complain of rashes or itching.  Head ears eyes nose mouth and throat does not complain of visual changes or sore throat.  Respiratory does not complain of shortness of breath.  Cardiac no chest pain has some mild lower extremity edema.  GI does not complaining of any nausea vomiting diarrhea constipation or abdominal pain currently.  GU being treated for UTI does not really complaining of dysuria this afternoon  Muscle  skeletal no joint pain but does have weakness.  Neurologic is not complaining of dizziness or headache.  In psych does appear to have dementia but appears to be progressing.    Immunization History  Administered Date(s) Administered  . Influenza Whole 05/30/2013  . Influenza-Unspecified 06/23/2014, 05/21/2015  . PPD Test 10/26/2013  . Pneumococcal-Unspecified 08/23/2013   Pertinent  Health Maintenance Due  Topic Date Due  . DEXA SCAN  08/16/1986  . PNA vac Low Risk Adult (2 of 2 - PCV13) 11/21/2016 (Originally  08/23/2014)  . INFLUENZA VACCINE  03/25/2016   No flowsheet data found. Functional Status Survey:    Filed Vitals:   11/26/15 1451  BP: 100/55  Pulse: 61  Temp: 97 F (36.1 C)  TempSrc: Oral  Resp: 18  Height: 5' 3.5" (1.613 m)  Weight: 163 lb 6.4 oz (74.118 kg)   Body mass index is 28.49 kg/(m^2). Physical Exam   In general this is a frail elderly female in no distress.  Her skin is warm and dry she appears somewhat pale.  Eyes pupils appear reactive light visual acuity grossly intact baseline with previous exams.  Oropharynx is clear mucous membranes moist.  Chest is clear to auscultation there is no labored breathing somewhat shallow air entry.  Heart is regular rate and rhythm without murmur gallop or rub she has trace lower extremity edema.  Abdomen soft with positive bowel sounds some mild left-sided tenderness but this does not appear to be acute she states this is somewhat chronic when it is palpated.  Musculoskeletal limited exam since she is in bed but is able to move all extremities 4 to appears with baseline I do not note any deformities.  Neurologic is grossly intact her speech is clear no lateralizing findings.  Psych she is oriented to self is pleasant conversant follows commands without any difficulty. But does have confusion which appears to have progressed  Labs reviewed:  Recent Labs  11/21/15 1847 11/23/15 0450 11/24/15 0353    NA 136 136 136  K 4.7 4.3 3.9  CL 98* 103 102  CO2 GLUCOSE 125* 90 79  BUN 32* 31* 20  CREATININE 2.02* 1.77* 1.31*  CALCIUM 9.1 8.6* 8.6*    Recent Labs  06/08/15 08/24/15 0938 11/24/15 0353  AST 24 27 48*  ALT ALKPHOS 64 86 66  BILITOT  --  0.5 0.6  PROT  --  7.9 6.7  ALBUMIN  --  3.3* 2.9*    Recent Labs  08/24/15 0938  11/21/15 1847 11/23/15 0450 11/24/15 0353  WBC 11.2*  < > 10.4 5.3 4.5  NEUTROABS 9.3*  --  7.2  --   --   HGB 12.3  < > 12.1 10.7* 11.4*  HCT 38.6  < > 39.0 35.2* 36.7  MCV 95.8  < > 97.7 97.5 95.6  PLT 253  < > 218 195 199  < > = values in this interval not displayed. Lab Results  Component Value Date   TSH 4.59 09/10/2015   No results found for: HGBA1C Lab Results  Component Value Date   CHOL 199 10/27/2013   HDL 38* 10/27/2013   LDLCALC 120* 10/27/2013   TRIG 204* 10/27/2013   CHOLHDL 5.2 10/27/2013    Significant Diagnostic Results in last 30 days:  Dg Chest 2 View  11/21/2015  CLINICAL DATA:  Chest pain relieved by nitroglycerin and aspirin. EXAM: CHEST  2 VIEW COMPARISON:  08/24/2015 and 04/10/2014) FINDINGS: Stable cardiomegaly, aortic atherosclerosis and ectasia. The lungs appear stable. There is no edema, confluent airspace opacity, pleural effusion or pneumothorax. No acute osseous findings are seen. IMPRESSION: Stable chest with stable cardiomegaly and aortic atherosclerosis. No acute findings. Electronically Signed   By: Carey Bullocks M.D.   On: 11/21/2015 19:17     Assessment and plan.  #1 UTI she is completing a course of Bactrim she appears to be stable although weak will update in CBC and metabolic panel later this week.  History of  coronary artery disease with history of MI chronically occluded LAD-again she continues on medical management with metoprolol 25 mg twice a day-aspirin 81 mg a day as well as Plavix 75 mg daily.  #3 history of chronic kidney disease-lab done on April 1 shows stability  with creatinine of 1.31 BUN of 20 this will need to be updated as well.  #4 history of remote pulmonary embolism? As noted above she is hemodynamically stable with low pretest probability fragile embolus per hospital evaluation she is not complaining of chest pain or shortness of breath today at this point continues current medications including Plavix and aspirin.  #5 history of progressing dementia failure to thrive-this was extensively discussed with numerous family members in the hospital per discharge summary I also discussed this with one of her daughters at bedside-he states her mom has good days and bad days but she is having more bad days here progressively-emphasis will be on comfort-I will order a palliative care consult  and I did discuss this with one of her daughters.  #6-history of edema this appears to be fairly well controlled she is on Lasix every other day again will have to watch this in light of her renal function as well.  #7 history of hypertension at this point this appears stable recent blood pressures 100/55-142/60 again she is on Lasix every other day.  [id number] note greater than 40 minutes spent assessing patient-reviewing her chart-discussing her status with her daughter at bedside-reviewing labs-and coordinating and formulating a plan of care for numerous diagnoses-of note greater than 50% of time spent coordinating plan of care

## 2015-11-26 NOTE — Progress Notes (Deleted)
Location:  [facility] Room Number: 306-W Place of Service:  SNF [phone]) Provider:    Kaleen Mask, MD  Patient Care Team: Kaleen Mask, MD as PCP - General (Family Medicine)  Extended Emergency Contact Information Primary Emergency Contact: Hall Busing Address: [address]          [place], [place] [address] [place] Home Phone: [phone] Relation: Daughter Secondary Emergency Contact: Garner,Cathy Address: [phone]          [place] [address] [place] Home Phone: [phone] Work Phone: [phone] Relation: Daughter  Code Status: DNR Goals of care: Advanced Directive information Advanced Directives 11/26/2015  Does patient have an advance directive? No  Type of Advance Directive Out of facility DNR (pink MOST or yellow form)  Does patient want to make changes to advanced directive? -  Copy of advanced directive(s) in chart? -     Chief Complaint  Patient presents with  . Readmit To SNF    HPI:  Pt is a 82 y.o. female seen today for a hospital f/u s/p admission from  Past Medical History  Diagnosis Date  . Hypertension   . Cataract     both eyes  . Osteoarthritis   . Glaucoma   . Poor circulation   . CAD (coronary artery disease)     a. 02/2001 PTCA & BMS to the OM1;  b. 08/2001 CBA to ISR in OM1, CBA of mid LAD;  c. 02/2002 Failed PCI of mLAD occlusion, LCX nl, patent OM1 stent, RCA dom, 40p, PDA/PL nl, EF 50% mild antlat HK.  Marland Kitchen Thyroid disease     hypothyroidism, hypothyroidism  . Hypertriglyceridemia   . UTI (urinary tract infection) 08/24/2015  . AKI (acute kidney injury) (HCC) 07/2015   Past Surgical History  Procedure Laterality Date  . Abdominal hysterectomy  1972  . Eye surgery  2007/2008    cataracts  . Cardiac surgery  2001    stent placement  . Hernia repair  2009  . Gallbladder surgery  2009  . Hip arthroplasty  06/06/2012    Procedure:  ARTHROPLASTY BIPOLAR HIP;  Surgeon: Eldred Manges, MD;  Location: [facility];  Service: Orthopedics;  Laterality: Right;  monopolar    Allergies  Allergen Reactions  . Codeine Nausea And Vomiting  . Morphine And Related Nausea And Vomiting  . Penicillins Hives      Medication List       This list is accurate as of: 11/26/15  2:35 PM.  Always use your most recent med list.               acetaminophen 500 MG tablet  Commonly known as:  TYLENOL  Take 500 mg by mouth at bedtime. pain     aspirin 81 MG chewable tablet  Chew 81 mg by mouth daily.     clopidogrel 75 MG tablet  Commonly known as:  PLAVIX  Take 75 mg by mouth daily.     Dorzolamide HCl-Timolol Mal PF 22.3-6.8 MG/ML Soln  Place 1 drop into both eyes every 12 (twelve) hours.     furosemide 20 MG tablet  Commonly known as:  LASIX  Take 1 tablet (20 mg total) by mouth every other day.     ICAPS Tabs  Take 1 tablet by mouth daily.     latanoprost 0.005 % ophthalmic solution  Commonly known as:  XALATAN  Place 1 drop into both eyes at bedtime.  magnesium hydroxide 400 MG/5ML suspension  Commonly known as:  MILK OF MAGNESIA  Take 30 mLs by mouth daily as needed for mild constipation.     metoprolol tartrate 25 MG tablet  Commonly known as:  LOPRESSOR  Take 25 mg by mouth 2 (two) times daily.     ondansetron 4 MG disintegrating tablet  Commonly known as:  ZOFRAN ODT  Take 1 tablet (4 mg total) by mouth every 8 (eight) hours as needed for nausea or vomiting.     oxyCODONE 5 MG immediate release tablet  Commonly known as:  ROXICODONE  Take 1 tablet (5 mg total) by mouth every 4 (four) hours as needed for severe pain.     pantoprazole 40 MG tablet  Commonly known as:  PROTONIX  Take 1 tablet (40 mg total) by mouth daily.     sulfamethoxazole-trimethoprim 800-160 MG tablet  Commonly known as:  BACTRIM DS,SEPTRA DS  Take 1 tablet by mouth every 12 (twelve) hours.        Review of Systems  Immunization  History  Administered Date(s) Administered  . Influenza Whole 05/30/2013  . Influenza-Unspecified 06/23/2014, 05/21/2015  . PPD Test 10/26/2013  . Pneumococcal-Unspecified 08/23/2013   Pertinent  Health Maintenance Due  Topic Date Due  . DEXA SCAN  08/16/1986  . PNA vac Low Risk Adult (2 of 2 - PCV13) 11/21/2016 (Originally 08/23/2014)  . INFLUENZA VACCINE  03/25/2016   No flowsheet data found. Functional Status Survey:    There were no vitals filed for this visit. There is no weight on file to calculate BMI. Physical Exam  Labs reviewed:  Recent Labs  11/21/15 1847 11/23/15 0450 11/24/15 0353  NA 136 136 136  K 4.7 4.3 3.9  CL 98* 103 102  CO2 GLUCOSE 125* 90 79  BUN 32* 31* 20  CREATININE 2.02* 1.77* 1.31*  CALCIUM 9.1 8.6* 8.6*    Recent Labs  06/08/15 08/24/15 0938 11/24/15 0353  AST 24 27 48*  ALT ALKPHOS 64 86 66  BILITOT  --  0.5 0.6  PROT  --  7.9 6.7  ALBUMIN  --  3.3* 2.9*    Recent Labs  08/24/15 0938  11/21/15 1847 11/23/15 0450 11/24/15 0353  WBC 11.2*  < > 10.4 5.3 4.5  NEUTROABS 9.3*  --  7.2  --   --   HGB 12.3  < > 12.1 10.7* 11.4*  HCT 38.6  < > 39.0 35.2* 36.7  MCV 95.8  < > 97.7 97.5 95.6  PLT 253  < > 218 195 199  < > = values in this interval not displayed. Lab Results  Component Value Date   TSH 4.59 09/10/2015   No results found for: HGBA1C Lab Results  Component Value Date   CHOL 199 10/27/2013   HDL 38* 10/27/2013   LDLCALC 120* 10/27/2013   TRIG 204* 10/27/2013   CHOLHDL 5.2 10/27/2013    Significant Diagnostic Results in last 30 days:  Dg Chest 2 View   11/21/2015  CLINICAL DATA:  Chest pain relieved by nitroglycerin and aspirin. EXAM: CHEST  2 VIEW COMPARISON:  08/24/2015 and 04/10/2014) FINDINGS: Stable cardiomegaly, aortic atherosclerosis and ectasia. The lungs appear stable. There is no edema, confluent airspace opacity, pleural effusion or pneumothorax. No acute osseous findings are  seen. IMPRESSION: Stable chest with stable cardiomegaly and aortic atherosclerosis. No acute findings. Electronically Signed   By: Carey Bullocks M.D.   On: 11/21/2015  19:17    Assessment/Plan There are no diagnoses linked to this encounter.   Family/ staff Communication:  Labs/tests ordered:

## 2015-11-28 ENCOUNTER — Non-Acute Institutional Stay (SKILLED_NURSING_FACILITY): Payer: Medicare Other | Admitting: Internal Medicine

## 2015-11-28 ENCOUNTER — Encounter: Payer: Self-pay | Admitting: Internal Medicine

## 2015-11-28 DIAGNOSIS — I10 Essential (primary) hypertension: Secondary | ICD-10-CM

## 2015-11-28 DIAGNOSIS — Z86711 Personal history of pulmonary embolism: Secondary | ICD-10-CM

## 2015-11-28 DIAGNOSIS — N183 Chronic kidney disease, stage 3 unspecified: Secondary | ICD-10-CM

## 2015-11-28 DIAGNOSIS — B9689 Other specified bacterial agents as the cause of diseases classified elsewhere: Secondary | ICD-10-CM

## 2015-11-28 DIAGNOSIS — K219 Gastro-esophageal reflux disease without esophagitis: Secondary | ICD-10-CM

## 2015-11-28 DIAGNOSIS — A414 Sepsis due to anaerobes: Secondary | ICD-10-CM

## 2015-11-28 DIAGNOSIS — H409 Unspecified glaucoma: Secondary | ICD-10-CM | POA: Diagnosis not present

## 2015-11-28 DIAGNOSIS — N3090 Cystitis, unspecified without hematuria: Secondary | ICD-10-CM

## 2015-11-28 DIAGNOSIS — I2510 Atherosclerotic heart disease of native coronary artery without angina pectoris: Secondary | ICD-10-CM

## 2015-11-28 DIAGNOSIS — I2584 Coronary atherosclerosis due to calcified coronary lesion: Secondary | ICD-10-CM

## 2015-11-28 DIAGNOSIS — J9601 Acute respiratory failure with hypoxia: Secondary | ICD-10-CM

## 2015-11-28 DIAGNOSIS — N179 Acute kidney failure, unspecified: Secondary | ICD-10-CM | POA: Diagnosis not present

## 2015-11-28 DIAGNOSIS — B961 Klebsiella pneumoniae [K. pneumoniae] as the cause of diseases classified elsewhere: Secondary | ICD-10-CM

## 2015-11-28 DIAGNOSIS — N189 Chronic kidney disease, unspecified: Secondary | ICD-10-CM

## 2015-11-28 NOTE — Progress Notes (Signed)
MRN: [id number] Name: KHALIL BELOTE  Sex: female Age: 82 y.o. DOB: July 02, 1919  [facility] Facility/Room:306 Level Of Care: SNF Provider: Merrilee Seashore D Emergency Contacts: Extended Emergency Contact Information Primary Emergency Contact: Wicker,Theresa Address: [address]          [place], [place] [address] [place] Home Phone: [phone] Relation: Daughter Secondary Emergency Contact: Garner,Cathy Address: [phone]          [place] [address] [place] Home Phone: [phone] Work Phone: [phone] Relation: Daughter  Code Status:   Allergies: Codeine; Morphine and related; and Penicillins  Chief Complaint  Patient presents with  . Readmit To SNF    HPI: Patient is 82 y.o. female whoHip fracture status post right hemiarthroplasty 10/2013htnCK D stage IIICAD-prior MRI 03/05/2001 chronically occluded LAD, last cardiac cath 2003History ofpseudoaneurysm left groin 2002 status post this procedurePrior R foot hematomaCholecystectomy with complication of biloma? 2009Remote pulmonary embolism at some point in time?-No records available who was admitted to [facility] from 3/29-4/1 for urosepsis. Pt has been declining and felt to be in her final months of life.Pt is admitted to SNF with generalized weakness and for residential care.While at SNF pt will be followed for CAD, tx with metoprolol, ASA and plavix, GERD, tx with protonix and glaucoma tx with xalantin and cosopt.  Past Medical History  Diagnosis Date  . Hypertension   . Cataract     both eyes  . Osteoarthritis   . Glaucoma   . Poor circulation   . CAD (coronary artery disease)     a. 02/2001 PTCA & BMS to the OM1;  b. 08/2001 CBA to ISR in OM1, CBA of mid LAD;  c. 02/2002 Failed PCI of mLAD occlusion, LCX nl, patent OM1 stent, RCA dom, 40p, PDA/PL nl, EF 50% mild antlat HK.  Marland Kitchen Thyroid disease     hypothyroidism, hypothyroidism  . Hypertriglyceridemia   . UTI (urinary tract infection)  08/24/2015  . AKI (acute kidney injury) (HCC) 07/2015    Past Surgical History  Procedure Laterality Date  . Abdominal hysterectomy  1972  . Eye surgery  2007/2008    cataracts  . Cardiac surgery  2001    stent placement  . Hernia repair  2009  . Gallbladder surgery  2009  . Hip arthroplasty  06/06/2012    Procedure: ARTHROPLASTY BIPOLAR HIP;  Surgeon: Eldred Manges, MD;  Location: [facility];  Service: Orthopedics;  Laterality: Right;  monopolar      Medication List       This list is accurate as of: 11/28/15 11:59 PM.  Always use your most recent med list.               acetaminophen 500 MG tablet  Commonly known as:  TYLENOL  Take 500 mg by mouth at bedtime. pain     aspirin 81 MG chewable tablet  Chew 81 mg by mouth daily.     clopidogrel 75 MG tablet  Commonly known as:  PLAVIX  Take 75 mg by mouth daily.     Dorzolamide HCl-Timolol Mal PF 22.3-6.8 MG/ML Soln  Place 1 drop into both eyes every 12 (twelve) hours.     furosemide 20 MG tablet  Commonly known as:  LASIX  Take 1 tablet (20 mg total) by mouth every other day.     ICAPS Tabs  Take 1 tablet by mouth daily.     latanoprost 0.005 % ophthalmic solution  Commonly known as:  XALATAN  Place 1 drop into both eyes at bedtime.     magnesium hydroxide 400 MG/5ML suspension  Commonly known as:  MILK OF MAGNESIA  Take 30 mLs by mouth daily as needed for mild constipation.     metoprolol tartrate 25 MG tablet  Commonly known as:  LOPRESSOR  Take 25 mg by mouth 2 (two) times daily.     ondansetron 4 MG disintegrating tablet  Commonly known as:  ZOFRAN ODT  Take 1 tablet (4 mg total) by mouth every 8 (eight) hours as needed for nausea or vomiting.     oxyCODONE 5 MG immediate release tablet  Commonly known as:  ROXICODONE  Take 1 tablet (5 mg total) by mouth every 4 (four) hours as needed for severe pain.     pantoprazole 40 MG tablet  Commonly known as:  PROTONIX  Take 1 tablet (40 mg total) by mouth  daily.     sulfamethoxazole-trimethoprim 800-160 MG tablet  Commonly known as:  BACTRIM DS,SEPTRA DS  Take 1 tablet by mouth every 12 (twelve) hours.        No orders of the defined types were placed in this encounter.    Immunization History  Administered Date(s) Administered  . Influenza Whole 05/30/2013  . Influenza-Unspecified 06/23/2014, 05/21/2015  . PPD Test 10/26/2013  . Pneumococcal-Unspecified 08/23/2013    Social History  Substance Use Topics  . Smoking status: Never Smoker   . Smokeless tobacco: Never Used  . Alcohol Use: No    Family history is + HD, HTN   Review of Systems  DATA OBTAINED: from patient, nurse- mostly nurse GENERAL:  no fevers, fatigue, appetite changes SKIN: No itching, rash or wounds EYES: No eye pain, redness, discharge EARS: No earache, tinnitus, change in hearing NOSE: No congestion, drainage or bleeding  MOUTH/THROAT: No mouth or tooth pain, No sore throat RESPIRATORY: No cough, wheezing, SOB CARDIAC: No chest pain, palpitations, lower extremity edema  GI: No abdominal pain, No N/V/D or constipation, No heartburn or reflux  GU: No dysuria, frequency or urgency, or incontinence  MUSCULOSKELETAL: No unrelieved bone/joint pain NEUROLOGIC: No headache, dizziness or focal weakness PSYCHIATRIC: No c/o anxiety or sadness   Filed Vitals:   11/28/15 1308  BP: 142/64  Pulse: 56  Temp: 97 F (36.1 C)  Resp: 17    SpO2 Readings from Last 1 Encounters:  11/24/15 95%        Physical Exam  GENERAL APPEARANCE: Alert,  No acute distress.  SKIN: No diaphoresis rash HEAD: Normocephalic, atraumatic  EYES: Conjunctiva/lids clear. Pupils round, reactive. EOMs intact.  EARS: External exam WNL, canals clear. Hearing grossly normal.  NOSE: No deformity or discharge.  MOUTH/THROAT: Lips w/o lesions  RESPIRATORY: Breathing is even, unlabored. Lung sounds are clear   CARDIOVASCULAR: Heart RRR 3/6 murmur , no rubs or gallops. No peripheral  edema.   GASTROINTESTINAL: Abdomen is soft, non-tender, not distended w/ normal bowel sounds. GENITOURINARY: Bladder non tender, not distended  MUSCULOSKELETAL: No abnormal joints or musculature NEUROLOGIC:  Cranial nerves 2-12 grossly intact. Moves all extremities  PSYCHIATRIC: Mood and affect appropriate to situation with dementia, no behavioral issues  Patient Active Problem List   Diagnosis Date Noted  . Klebsiella pneumoniae sepsis (HCC) 12/01/2015  . Hx pulmonary embolism 12/01/2015  . Palliative care encounter   . Pain in the chest   . Weakness   . Acute on chronic renal failure (HCC) 11/21/2015  . Acute respiratory failure with hypoxia (HCC) 11/21/2015  .  CKD (chronic kidney disease) stage 3, GFR 30-59 ml/min 11/18/2015  . E. coli UTI (urinary tract infection) 08/28/2015  . GERD (gastroesophageal reflux disease) 08/28/2015  . Klebsiella cystitis 08/24/2015  . ARF (acute renal failure) (HCC) 08/24/2015  . Cough 09/24/2014  . Pain in joint, ankle and foot 04/15/2014  . Cellulitis 04/10/2014  . Pure hyperglyceridemia 12/22/2013  . Renal insufficiency 11/06/2013  . Chest pain 10/26/2013  . Epigastric pain 10/26/2013  . OA (osteoarthritis) 10/03/2013  . Edema 03/03/2013  . Essential hypertension, benign 01/05/2013  . Anemia 01/05/2013  . Glaucoma 06/06/2012  . CAD (coronary artery disease) 06/05/2012    CBC    Component Value Date/Time   WBC 4.5 11/24/2015 0353   WBC 8.6 09/04/2015   RBC 3.84* 11/24/2015 0353   HGB 11.4* 11/24/2015 0353   HCT 36.7 11/24/2015 0353   PLT 199 11/24/2015 0353   MCV 95.6 11/24/2015 0353   LYMPHSABS 1.9 11/21/2015 1847   MONOABS 1.2* 11/21/2015 1847   EOSABS 0.1 11/21/2015 1847   BASOSABS 0.0 11/21/2015 1847    CMP     Component Value Date/Time   NA 136 11/24/2015 0353   NA 142 09/04/2015   K 3.9 11/24/2015 0353   CL 102 11/24/2015 0353   CO2 23 11/24/2015 0353   GLUCOSE 79 11/24/2015 0353   BUN 20 11/24/2015 0353   BUN 22*  09/04/2015   CREATININE 1.31* 11/24/2015 0353   CREATININE 1.3* 09/04/2015   CALCIUM 8.6* 11/24/2015 0353   PROT 6.7 11/24/2015 0353   ALBUMIN 2.9* 11/24/2015 0353   AST 48* 11/24/2015 0353   ALT 22 11/24/2015 0353   ALKPHOS 66 11/24/2015 0353   BILITOT 0.6 11/24/2015 0353   GFRNONAA 34* 11/24/2015 0353   GFRAA 39* 11/24/2015 0353    No results found for: HGBA1C   Dg Chest 2 View  11/21/2015  CLINICAL DATA:  Chest pain relieved by nitroglycerin and aspirin. EXAM: CHEST  2 VIEW COMPARISON:  08/24/2015 and 04/10/2014) FINDINGS: Stable cardiomegaly, aortic atherosclerosis and ectasia. The lungs appear stable. There is no edema, confluent airspace opacity, pleural effusion or pneumothorax. No acute osseous findings are seen. IMPRESSION: Stable chest with stable cardiomegaly and aortic atherosclerosis. No acute findings. Electronically Signed   By: Carey Bullocks M.D.   On: 11/21/2015 19:17    Not all labs, radiology exams or other studies done during hospitalization come through on my EPIC note; however they are reviewed by me.    Assessment and Plan  Klebsiella pneumoniae sepsis (HCC) 1. secondary to Klebsiella pyelonephritis-treat the treatable, received Rocephin initially-->Bactrim DS 11/23/2015--; SNF - complete bactrim on 11/26/15   Klebsiella cystitis Treated initially with rocephin, the septa ; SNF - cont septa DS for 2 more days  Acute respiratory failure with hypoxia (HCC) O2 sat initially in the 80's on RA; improved with tx of sepsis; SNF - monitor for recurrent resp difficulties  CAD (coronary artery disease) With history of MI 2002 chronically occluded LAD-medical management only. SNF - Continue metoprolol 25 twice a day, aspirin 81 daily, Plavix 75 daily  Essential hypertension, benign SNF - controlled on metoprolol and QOD lasix  CKD (chronic kidney disease) stage 3, GFR 30-59 ml/min SNF - pt reported to be euvolemic and stable  Acute on chronic renal failure  (HCC) Cr went up to 2.02, on d/c 1.31; SNF - will f/u BMP  Hx pulmonary embolism SNF - reported pt with elevated D Dimer but pt/family refused VQ scan - low pre-test probability  GERD (gastroesophageal reflux disease) SNF - not rported as unstable;cont protonix 40 mg daily  Glaucoma SNF - cont cosopt and xalantan   Time spent > 45 min;> 50% of time with patient was spent reviewing records, labs, tests and studies, counseling and developing plan of care   Margit HanksALEXANDER, Cardelia Sassano D, MD

## 2015-11-28 NOTE — Progress Notes (Signed)
Patient ID: [name] [name], female   DOB: Aug 01, 1919, 82 y.o.   MRN: [id number]

## 2015-11-29 LAB — CBC AND DIFFERENTIAL
HEMATOCRIT: 34 % — AB (ref 36–46)
Hemoglobin: 12.1 g/dL (ref 12.0–16.0)
Platelets: 220 10*3/uL (ref 150–399)
WBC: 7.7 10^3/mL

## 2015-11-29 LAB — BASIC METABOLIC PANEL
BUN: 23 mg/dL — AB (ref 4–21)
Creatinine: 1.5 mg/dL — AB (ref 0.5–1.1)
Glucose: 90 mg/dL
Potassium: 3.8 mmol/L (ref 3.4–5.3)
Sodium: 137 mmol/L (ref 137–147)

## 2015-12-01 ENCOUNTER — Encounter: Payer: Self-pay | Admitting: Internal Medicine

## 2015-12-01 DIAGNOSIS — Z86711 Personal history of pulmonary embolism: Secondary | ICD-10-CM | POA: Insufficient documentation

## 2015-12-01 DIAGNOSIS — A414 Sepsis due to anaerobes: Secondary | ICD-10-CM | POA: Insufficient documentation

## 2015-12-01 NOTE — Assessment & Plan Note (Signed)
SNF - reported pt with elevated D Dimer but pt/family refused VQ scan - low pre-test probability

## 2015-12-01 NOTE — Assessment & Plan Note (Signed)
SNF - pt reported to be euvolemic and stable

## 2015-12-01 NOTE — Assessment & Plan Note (Signed)
SNF - cont cosopt and xalantan

## 2015-12-01 NOTE — Assessment & Plan Note (Signed)
O2 sat initially in the 80's on RA; improved with tx of sepsis; SNF - monitor for recurrent resp difficulties

## 2015-12-01 NOTE — Assessment & Plan Note (Signed)
Cr went up to 2.02, on d/c 1.31; SNF - will f/u BMP

## 2015-12-01 NOTE — Assessment & Plan Note (Signed)
With history of MI 2002 chronically occluded LAD-medical management only. SNF - Continue metoprolol 25 twice a day, aspirin 81 daily, Plavix 75 daily

## 2015-12-01 NOTE — Assessment & Plan Note (Signed)
1. secondary to Klebsiella pyelonephritis-treat the treatable, received Rocephin initially-->Bactrim DS 11/23/2015--; SNF - complete bactrim on 11/26/15

## 2015-12-01 NOTE — Assessment & Plan Note (Signed)
SNF - controlled on metoprolol and QOD lasix

## 2015-12-01 NOTE — Assessment & Plan Note (Signed)
SNF - not rported as unstable;cont protonix 40 mg daily

## 2015-12-01 NOTE — Assessment & Plan Note (Signed)
Treated initially with rocephin, the septa ; SNF - cont septa DS for 2 more days

## 2015-12-07 LAB — LIPID PANEL
CHOLESTEROL: 153 mg/dL (ref 0–200)
HDL: 30 mg/dL — AB (ref 35–70)
LDL CALC: 81 mg/dL
TRIGLYCERIDES: 210 mg/dL — AB (ref 40–160)

## 2015-12-07 LAB — TSH: TSH: 7.14 u[IU]/mL — AB (ref 0.41–5.90)

## 2016-01-04 ENCOUNTER — Non-Acute Institutional Stay (SKILLED_NURSING_FACILITY): Payer: Medicare Other | Admitting: Internal Medicine

## 2016-01-04 ENCOUNTER — Encounter: Payer: Self-pay | Admitting: Internal Medicine

## 2016-01-04 DIAGNOSIS — J9601 Acute respiratory failure with hypoxia: Secondary | ICD-10-CM

## 2016-01-04 DIAGNOSIS — I10 Essential (primary) hypertension: Secondary | ICD-10-CM | POA: Diagnosis not present

## 2016-01-04 DIAGNOSIS — I2584 Coronary atherosclerosis due to calcified coronary lesion: Secondary | ICD-10-CM

## 2016-01-04 DIAGNOSIS — I2510 Atherosclerotic heart disease of native coronary artery without angina pectoris: Secondary | ICD-10-CM | POA: Diagnosis not present

## 2016-01-04 NOTE — Progress Notes (Signed)
MRN: [id number] Name: [name] [name]  Sex: female Age: 82 y.o. DOB: April 28, 1919  PSC #:  [facility] Facility/Room: 306 W Level Of Care: SNF Provider: Margit Hanks Emergency Contacts: Extended Emergency Contact Information Primary Emergency Contact: Wicker,Theresa Address: [address]          [place], [place] [address] [place] Home Phone: [phone] Relation: Daughter Secondary Emergency Contact: Garner,Cathy Address: [phone]          [place] [address] [place] Home Phone: [phone] Work Phone: [phone] Relation: Daughter  Code Status: DNR  Allergies: Codeine; Morphine and related; and Penicillins  Chief Complaint  Patient presents with  . Medical Management of Chronic Issues    Routine Visit    HPI: Patient is 82 y.o. female who is being seen for routine issues of HTN, CAD and s/p respiratory failure.  Past Medical History  Diagnosis Date  . Hypertension   . Cataract     both eyes  . Osteoarthritis   . Glaucoma   . Poor circulation   . CAD (coronary artery disease)     a. 02/2001 PTCA & BMS to the OM1;  b. 08/2001 CBA to ISR in OM1, CBA of mid LAD;  c. 02/2002 Failed PCI of mLAD occlusion, LCX nl, patent OM1 stent, RCA dom, 40p, PDA/PL nl, EF 50% mild antlat HK.  Marland Kitchen Thyroid disease     hypothyroidism, hypothyroidism  . Hypertriglyceridemia   . UTI (urinary tract infection) 08/24/2015  . AKI (acute kidney injury) (HCC) 07/2015    Past Surgical History  Procedure Laterality Date  . Abdominal hysterectomy  1972  . Eye surgery  2007/2008    cataracts  . Cardiac surgery  2001    stent placement  . Hernia repair  2009  . Gallbladder surgery  2009  . Hip arthroplasty  06/06/2012    Procedure: ARTHROPLASTY BIPOLAR HIP;  Surgeon: Eldred Manges, MD;  Location: [facility];  Service: Orthopedics;  Laterality: Right;  monopolar      Medication List       This list is accurate as of: 01/04/16 11:59 PM.  Always use your  most recent med list.               aspirin 81 MG chewable tablet  Chew 81 mg by mouth daily.     clopidogrel 75 MG tablet  Commonly known as:  PLAVIX  Take 75 mg by mouth daily.     Dorzolamide HCl-Timolol Mal PF 22.3-6.8 MG/ML Soln  Place 1 drop into both eyes every 12 (twelve) hours.     furosemide 20 MG tablet  Commonly known as:  LASIX  Take 1 tablet (20 mg total) by mouth every other day.     ICAPS Tabs  Take 1 tablet by mouth daily.     latanoprost 0.005 % ophthalmic solution  Commonly known as:  XALATAN  Place 1 drop into both eyes at bedtime.     magnesium hydroxide 400 MG/5ML suspension  Commonly known as:  MILK OF MAGNESIA  Take 30 mLs by mouth daily as needed for mild constipation.     metoprolol tartrate 25 MG tablet  Commonly known as:  LOPRESSOR  Take 25 mg by mouth 2 (two) times daily.     ondansetron 4 MG disintegrating tablet  Commonly known as:  ZOFRAN ODT  Take 1 tablet (4 mg total) by mouth every 8 (eight) hours as needed for nausea or vomiting.  pantoprazole 40 MG tablet  Commonly known as:  PROTONIX  Take 1 tablet (40 mg total) by mouth daily.     traMADol 50 MG tablet  Commonly known as:  ULTRAM  Take 25 mg by mouth at bedtime.        Meds ordered this encounter  Medications  . traMADol (ULTRAM) 50 MG tablet    Sig: Take 25 mg by mouth at bedtime.    Immunization History  Administered Date(s) Administered  . Influenza Whole 05/30/2013  . Influenza-Unspecified 06/23/2014, 05/21/2015  . PPD Test 10/26/2013  . Pneumococcal-Unspecified 08/23/2013    Social History  Substance Use Topics  . Smoking status: Never Smoker   . Smokeless tobacco: Never Used  . Alcohol Use: No    Review of Systems  DATA OBTAINED: from nurse GENERAL:  no fevers, fatigue, appetite changes SKIN: No itching, rash HEENT: No complaint RESPIRATORY: No cough, wheezing, SOB CARDIAC: No chest pain, palpitations, lower extremity edema  GI: No  abdominal pain, No N/V/D or constipation, No heartburn or reflux  GU: No dysuria, frequency or urgency, or incontinence  MUSCULOSKELETAL: No unrelieved bone/joint pain NEUROLOGIC: No headache, dizziness  PSYCHIATRIC: No overt anxiety or sadness  Filed Vitals:   01/04/16 1150  BP: 135/56  Pulse: 61  Temp: 98.8 F (37.1 C)  Resp: 18    Physical Exam  GENERAL APPEARANCE: Alert, min conversant, No acute distress  SKIN: No diaphoresis rash HEENT: Unremarkable RESPIRATORY: Breathing is even, unlabored. Lung sounds are clear   CARDIOVASCULAR: Heart RRR no murmurs, rubs or gallops. No peripheral edema  GASTROINTESTINAL: Abdomen is soft, non-tender, not distended w/ normal bowel sounds.  GENITOURINARY: Bladder non tender, not distended  MUSCULOSKELETAL: No abnormal joints or musculature NEUROLOGIC: Cranial nerves 2-12 grossly intact. Moves all extremities PSYCHIATRIC: normal affect with dementia, no behavioral issues  Patient Active Problem List   Diagnosis Date Noted  . Klebsiella pneumoniae sepsis (HCC) 12/01/2015  . Hx pulmonary embolism 12/01/2015  . Palliative care encounter   . Pain in the chest   . Weakness   . Acute on chronic renal failure (HCC) 11/21/2015  . Acute respiratory failure with hypoxia (HCC) 11/21/2015  . CKD (chronic kidney disease) stage 3, GFR 30-59 ml/min 11/18/2015  . E. coli UTI (urinary tract infection) 08/28/2015  . GERD (gastroesophageal reflux disease) 08/28/2015  . Klebsiella cystitis 08/24/2015  . ARF (acute renal failure) (HCC) 08/24/2015  . Cough 09/24/2014  . Pain in joint, ankle and foot 04/15/2014  . Cellulitis 04/10/2014  . Pure hyperglyceridemia 12/22/2013  . Renal insufficiency 11/06/2013  . Chest pain 10/26/2013  . Epigastric pain 10/26/2013  . OA (osteoarthritis) 10/03/2013  . Edema 03/03/2013  . Essential hypertension, benign 01/05/2013  . Anemia 01/05/2013  . Glaucoma 06/06/2012  . CAD (coronary artery disease) 06/05/2012     CBC    Component Value Date/Time   WBC 7.7 11/29/2015   WBC 4.5 11/24/2015 0353   RBC 3.84* 11/24/2015 0353   HGB 12.1 11/29/2015   HCT 34* 11/29/2015   PLT 220 11/29/2015   MCV 95.6 11/24/2015 0353   LYMPHSABS 1.9 11/21/2015 1847   MONOABS 1.2* 11/21/2015 1847   EOSABS 0.1 11/21/2015 1847   BASOSABS 0.0 11/21/2015 1847    CMP     Component Value Date/Time   NA 137 11/29/2015   NA 136 11/24/2015 0353   K 3.8 11/29/2015   CL 102 11/24/2015 0353   CO2 23 11/24/2015 0353   GLUCOSE 79 11/24/2015 0353  BUN 23* 11/29/2015   BUN 20 11/24/2015 0353   CREATININE 1.5* 11/29/2015   CREATININE 1.31* 11/24/2015 0353   CALCIUM 8.6* 11/24/2015 0353   PROT 6.7 11/24/2015 0353   ALBUMIN 2.9* 11/24/2015 0353   AST 48* 11/24/2015 0353   ALT 22 11/24/2015 0353   ALKPHOS 66 11/24/2015 0353   BILITOT 0.6 11/24/2015 0353   GFRNONAA 34* 11/24/2015 0353   GFRAA 39* 11/24/2015 0353    Assessment and Plan  CAD (coronary artery disease) Chronic and stable; cont lopressor 25 mg BID and ASA; pt is not on statin 2/2 age  Acute respiratory failure with hypoxia (HCC) Pt has had no resp problems since admission; does not wear O2; cont monitor  Essential hypertension, benign Controlled with lasix 20 mg and metoprolol 25 mg BID;plan - cont current meds    Margit HanksAlexander, Latoya Maulding D, MD

## 2016-01-05 ENCOUNTER — Encounter: Payer: Self-pay | Admitting: Internal Medicine

## 2016-01-05 NOTE — Assessment & Plan Note (Signed)
Chronic and stable; cont lopressor 25 mg BID and ASA; pt is not on statin 2/2 age

## 2016-01-05 NOTE — Assessment & Plan Note (Signed)
Pt has had no resp problems since admission; does not wear O2; cont monitor

## 2016-01-05 NOTE — Assessment & Plan Note (Signed)
Controlled with lasix 20 mg and metoprolol 25 mg BID;plan - cont current meds

## 2016-01-07 ENCOUNTER — Encounter: Payer: Self-pay | Admitting: Internal Medicine

## 2016-01-07 ENCOUNTER — Non-Acute Institutional Stay (SKILLED_NURSING_FACILITY): Payer: Medicare Other | Admitting: Internal Medicine

## 2016-01-07 DIAGNOSIS — E038 Other specified hypothyroidism: Secondary | ICD-10-CM | POA: Diagnosis not present

## 2016-01-07 DIAGNOSIS — R1319 Other dysphagia: Secondary | ICD-10-CM

## 2016-01-07 NOTE — Progress Notes (Signed)
Location:  [facility] Room Number: 306/W Place of Service:  SNF [phone]) Provider:  Alyse Low, MD  Patient Care Team: Kaleen Mask, MD as PCP - General (Family Medicine)  Extended Emergency Contact Information Primary Emergency Contact: Hall Busing Address: [address]          [place], [place] [address] [place] Home Phone: [phone] Relation: Daughter Secondary Emergency Contact: Garner,Cathy Address: [phone]           [address] [place] Home Phone: [phone] Work Phone: [phone] Relation: Daughter  Code Status:  DNR  Goals of care: Advanced Directive information Advanced Directives 01/07/2016  Does patient have an advance directive? Yes  Type of Estate agent of [place];Out of facility DNR (pink MOST or yellow form)  Does patient want to make changes to advanced directive? No - Patient declined  Copy of advanced directive(s) in chart? Yes    Chief complaint-acute visit secondary to possible dysphasia-follow-up hypothyroidism.     HPI:  Pt is a 82 y.o. female seen today for an acute visit for above issues.  Patient stated today that she thinks sometimes her food goes down the wrong tube-she does not have her to have a history of aspiration pneumonia-she does have significant cardiac issues-With chronically occluded LAD-this appears to be stable she is on aspirin and statin.  Patient does have a history hypothyroidism was somewhat variable TSHs which are high and then become quite low with minor medication adjustments of her Synthroid-patient T4 back in March which 0.9 T3 was 2.4 TSH was elevated at 14.13.--Currently she is not on Synthroid-again her TSH appears to bottom out when she is put on  She does not report any chest pain currently tachycardia-bradycardia or syncopal-type feelings.     Past Medical History    Diagnosis Date  . Hypertension   . Cataract     both eyes  . Osteoarthritis   . Glaucoma   . Poor circulation   . CAD (coronary artery disease)     a. 02/2001 PTCA & BMS to the OM1;  b. 08/2001 CBA to ISR in OM1, CBA of mid LAD;  c. 02/2002 Failed PCI of mLAD occlusion, LCX nl, patent OM1 stent, RCA dom, 40p, PDA/PL nl, EF 50% mild antlat HK.  Marland Kitchen Thyroid disease     hypothyroidism, hypothyroidism  . Hypertriglyceridemia   . UTI (urinary tract infection) 08/24/2015  . AKI (acute kidney injury) (HCC) 07/2015   Past Surgical History  Procedure Laterality Date  . Abdominal hysterectomy  1972  . Eye surgery  2007/2008    cataracts  . Cardiac surgery  2001    stent placement  . Hernia repair  2009  . Gallbladder surgery  2009  . Hip arthroplasty  06/06/2012    Procedure: ARTHROPLASTY BIPOLAR HIP;  Surgeon: Eldred Manges, MD;  Location: [facility];  Service: Orthopedics;  Laterality: Right;  monopolar    Allergies  Allergen Reactions  . Codeine Nausea And Vomiting  . Morphine And Related Nausea And Vomiting  . Penicillins Hives      Medication List       This list is accurate as of: 01/07/16  2:17 PM.  Always use your most recent med list.               aspirin 81 MG chewable tablet  Chew 81 mg by mouth daily.     clopidogrel 75 MG  tablet  Commonly known as:  PLAVIX  Take 75 mg by mouth daily.     Dorzolamide HCl-Timolol Mal PF 22.3-6.8 MG/ML Soln  Place 1 drop into both eyes every 12 (twelve) hours.     furosemide 20 MG tablet  Commonly known as:  LASIX  Take 1 tablet (20 mg total) by mouth every other day.     ICAPS Tabs  Take 1 tablet by mouth daily.     latanoprost 0.005 % ophthalmic solution  Commonly known as:  XALATAN  Place 1 drop into both eyes at bedtime.     magnesium hydroxide 400 MG/5ML suspension  Commonly known as:  MILK OF MAGNESIA  Take 30 mLs by mouth daily as needed for mild constipation.     metoprolol tartrate 25 MG tablet  Commonly known  as:  LOPRESSOR  Take 25 mg by mouth 2 (two) times daily.     ondansetron 4 MG disintegrating tablet  Commonly known as:  ZOFRAN ODT  Take 1 tablet (4 mg total) by mouth every 8 (eight) hours as needed for nausea or vomiting.     pantoprazole 40 MG tablet  Commonly known as:  PROTONIX  Take 1 tablet (40 mg total) by mouth daily.     traMADol 50 MG tablet  Commonly known as:  ULTRAM  Take 25 mg by mouth at bedtime.        Review of Systems   In general no complaints of fever or chills.  Skin does not complain of rashes or itching.  Head ears eyes nose mouth and throat does not complain of visual changes from baseline or sore throat says she has somewhat diminished visual acuity but this is not new.  Respiratory y does not complain of shortness breath or cough.  Cardiac denies chest pain does have chronic lower extremity edema.  GI does not complain of abdominal discomfort nausea or vomiting says at times when she each she thinks food goes down the wrong tube  Muscle skeletal other than weakness does not complain of joint pain.  Neurologic does not complain of headache or dizziness.  Psych does not complain of depression or anxiety  Immunization History  Administered Date(s) Administered  . Influenza Whole 05/30/2013  . Influenza-Unspecified 06/23/2014, 05/21/2015  . PPD Test 10/26/2013  . Pneumococcal-Unspecified 08/23/2013   Pertinent  Health Maintenance Due  Topic Date Due  . PNA vac Low Risk Adult (2 of 2 - PCV13) 11/21/2016 (Originally 08/23/2014)  . DEXA SCAN  01/06/2017 (Originally 08/16/1986)  . INFLUENZA VACCINE  03/25/2016   No flowsheet data found. Functional Status Survey:   Temp to 97.6 pulse 80 respirations 16 blood pressure 140/86   Physical Exam   In general this is a frail elderly female in no distress sitting comfortably in her lchair.  Her skin is warm and dry  She is somewhat pale.  Eyes pupils appear reactive to light sclera and  conjunctiva clear visual acuity appears grossly intact.  Oropharynx clear mucous membranes moist.  Chest is clear to auscultation no labored breathing.  Heart is regular rate and rhythm with trace lower extremity edema this is most noticeable in her feet.  Abdomen soft nontender positive bowel sounds.  Muscle skeletal does move all extremities 4 with lower extremity weakness which does not appear to be new she gradually appears becoming more frail and weak however.  Neurologic is grossly intact her speech is clear no lateralizing findings.  Inpsych she continues to be pleasant and appropriate  with periods of confusion  Labs reviewed:  Recent Labs  11/21/15 1847 11/23/15 0450 11/24/15 0353 11/29/15  NA 136 136 136 137  K 4.7 4.3 3.9 3.8  CL 98* 103 102  --   CO2 26 26 23   --   GLUCOSE 125* 90 79  --   BUN 32* 31* 20 23*  CREATININE 2.02* 1.77* 1.31* 1.5*  CALCIUM 9.1 8.6* 8.6*  --     Recent Labs  06/08/15 08/24/15 0938 11/24/15 0353  AST 24 27 48*  ALT 16 18 22   ALKPHOS 64 86 66  BILITOT  --  0.5 0.6  PROT  --  7.9 6.7  ALBUMIN  --  3.3* 2.9*    Recent Labs  08/24/15 0938  11/21/15 1847 11/23/15 0450 11/24/15 0353 11/29/15  WBC 11.2*  < > 10.4 5.3 4.5 7.7  NEUTROABS 9.3*  --  7.2  --   --   --   HGB 12.3  < > 12.1 10.7* 11.4* 12.1  HCT 38.6  < > 39.0 35.2* 36.7 34*  MCV 95.8  < > 97.7 97.5 95.6  --   PLT 253  < > 218 195 199 220  < > = values in this interval not displayed. Lab Results  Component Value Date   TSH 14.63* 11/05/2015   No results found for: HGBA1C Lab Results  Component Value Date   CHOL 199 10/27/2013   HDL 38* 10/27/2013   LDLCALC 120* 10/27/2013   TRIG 204* 10/27/2013   CHOLHDL 5.2 10/27/2013    Significant Diagnostic Results in last 30 days:  No results found.  Assessment/Plan #1 question dysphagia-she was a attempting to eat a piece of meat she says it would work better if it was smaller portions and cut more finely-which  nursing staff did do for her today we'll get a speech therapy consult to evaluate her complaints of occasional feeling that food goes down incorrectly again I do not see any recent episodes of aspiration but this will have to be watched.  #2-history hypothyroidism again she has extremely variable TSHs when Synthroid is initiated-currently not on Synthroid we will update a T4 and TSH level.  Also will update a CBC and metabolic panel for updated values she is on Lasix with a history of lower extremity edema in the past.  [id number]      London SheerLuster, Sally C, CMA [phone]

## 2016-01-09 LAB — CBC AND DIFFERENTIAL
HEMATOCRIT: 36 % (ref 36–46)
HEMOGLOBIN: 11.9 g/dL — AB (ref 12.0–16.0)
PLATELETS: 294 10*3/uL (ref 150–399)
WBC: 8.2 10*3/mL

## 2016-01-09 LAB — BASIC METABOLIC PANEL
BUN: 22 mg/dL — AB (ref 4–21)
Creatinine: 1.3 mg/dL — AB (ref 0.5–1.1)
Glucose: 107 mg/dL
Potassium: 4.4 mmol/L (ref 3.4–5.3)
Sodium: 138 mmol/L (ref 137–147)

## 2016-01-09 LAB — TSH: TSH: 13.56 u[IU]/mL — AB (ref 0.41–5.90)

## 2016-01-12 ENCOUNTER — Encounter: Payer: Self-pay | Admitting: Internal Medicine

## 2016-01-12 NOTE — Progress Notes (Signed)
Patient ID: [name] [name], female   DOB: 04-Apr-1919, 82 y.o.   MRN: [id number]

## 2016-01-16 ENCOUNTER — Encounter: Payer: Self-pay | Admitting: Internal Medicine

## 2016-01-16 ENCOUNTER — Non-Acute Institutional Stay (SKILLED_NURSING_FACILITY): Payer: Medicare Other | Admitting: Internal Medicine

## 2016-01-16 DIAGNOSIS — T17998A Other foreign object in respiratory tract, part unspecified causing other injury, initial encounter: Secondary | ICD-10-CM | POA: Diagnosis not present

## 2016-01-16 DIAGNOSIS — T17908A Unspecified foreign body in respiratory tract, part unspecified causing other injury, initial encounter: Secondary | ICD-10-CM

## 2016-01-16 DIAGNOSIS — K219 Gastro-esophageal reflux disease without esophagitis: Secondary | ICD-10-CM

## 2016-01-16 NOTE — Progress Notes (Signed)
MRN: [id number] Name: [name] [name]  Sex: female Age: 82 y.o. DOB: 06-17-1919  PSC #: [facility] Facility/Room:306 W Level Of Care: SNF Provider: Randon Goldsmith. [name], MD Emergency Contacts: Extended Emergency Contact Information Primary Emergency Contact: Hall Busing Address: [address]          [place], [place] [address] [place] Home Phone: [phone] Relation: Daughter Secondary Emergency Contact: Garner,Cathy Address: [phone]          [place] [address] [place] Home Phone: [phone] Work Phone: [phone] Relation: Daughter  Code Status: DNR  Allergies: Codeine; Morphine and related; and Penicillins  Chief Complaint  Patient presents with  . Acute Visit    Acute    HPI: Patient is 82 y.o. female who was being seen for routine issues but when we began our visit she c/o a cough for several months. Not getting worse, not getting better. No production, fever or feeling ill.  Past Medical History  Diagnosis Date  . Hypertension   . Cataract     both eyes  . Osteoarthritis   . Glaucoma   . Poor circulation   . CAD (coronary artery disease)     a. 02/2001 PTCA & BMS to the OM1;  b. 08/2001 CBA to ISR in OM1, CBA of mid LAD;  c. 02/2002 Failed PCI of mLAD occlusion, LCX nl, patent OM1 stent, RCA dom, 40p, PDA/PL nl, EF 50% mild antlat HK.  Marland Kitchen Thyroid disease     hypothyroidism, hypothyroidism  . Hypertriglyceridemia   . UTI (urinary tract infection) 08/24/2015  . AKI (acute kidney injury) (HCC) 07/2015    Past Surgical History  Procedure Laterality Date  . Abdominal hysterectomy  1972  . Eye surgery  2007/2008    cataracts  . Cardiac surgery  2001    stent placement  . Hernia repair  2009  . Gallbladder surgery  2009  . Hip arthroplasty  06/06/2012    Procedure: ARTHROPLASTY BIPOLAR HIP;  Surgeon: Eldred Manges, MD;  Location: [facility];  Service: Orthopedics;  Laterality: Right;  monopolar      Medication List        This list is accurate as of: 01/16/16 11:59 PM.  Always use your most recent med list.               aspirin 81 MG chewable tablet  Chew 81 mg by mouth daily.     clopidogrel 75 MG tablet  Commonly known as:  PLAVIX  Take 75 mg by mouth daily.     Dorzolamide HCl-Timolol Mal PF 22.3-6.8 MG/ML Soln  Place 1 drop into both eyes every 12 (twelve) hours.     furosemide 20 MG tablet  Commonly known as:  LASIX  Take 1 tablet (20 mg total) by mouth every other day.     ICAPS Tabs  Take 1 tablet by mouth daily.     latanoprost 0.005 % ophthalmic solution  Commonly known as:  XALATAN  Place 1 drop into both eyes at bedtime.     magnesium hydroxide 400 MG/5ML suspension  Commonly known as:  MILK OF MAGNESIA  Take 30 mLs by mouth daily as needed for mild constipation.     metoprolol tartrate 25 MG tablet  Commonly known as:  LOPRESSOR  Take 25 mg by mouth 2 (two) times daily.     ondansetron 4 MG disintegrating tablet  Commonly known as:  ZOFRAN ODT  Take 1 tablet (4 mg total) by  mouth every 8 (eight) hours as needed for nausea or vomiting.     pantoprazole 40 MG tablet  Commonly known as:  PROTONIX  Take 1 tablet (40 mg total) by mouth daily.     traMADol 50 MG tablet  Commonly known as:  ULTRAM  Take 25 mg by mouth at bedtime as needed.        No orders of the defined types were placed in this encounter.    Immunization History  Administered Date(s) Administered  . Influenza Whole 05/30/2013  . Influenza-Unspecified 06/23/2014, 05/21/2015  . PPD Test 10/26/2013  . Pneumococcal-Unspecified 08/23/2013    Social History  Substance Use Topics  . Smoking status: Never Smoker   . Smokeless tobacco: Never Used  . Alcohol Use: No    Review of Systems  DATA OBTAINED: from patient, nurse,ST, rehab GENERAL:  no fevers, fatigue, appetite changes SKIN: No itching, rash HEENT: No complaint RESPIRATORY: + cough,no  wheezing, no SOB CARDIAC: No chest pain,  palpitations, lower extremity edema  GI: No abdominal pain, No N/V/D or constipation, No heartburn or reflux  GU: No dysuria, frequency or urgency, or incontinence  MUSCULOSKELETAL: No unrelieved bone/joint pain NEUROLOGIC: No headache, dizziness  PSYCHIATRIC: No overt anxiety or sadness  Filed Vitals:   01/16/16 1410  BP: 112/65  Pulse: 74  Temp: 97.1 F (36.2 C)  Resp: 16    Physical Exam  GENERAL APPEARANCE: Alert, conversant, No acute distress  SKIN: No diaphoresis rash HEENT: slightly hoarse voice RESPIRATORY: Breathing is even, unlabored. Lung sounds are clear   CARDIOVASCULAR: Heart RRR no murmurs, rubs or gallops. No peripheral edema  GASTROINTESTINAL: Abdomen is soft, non-tender, not distended w/ normal bowel sounds.  GENITOURINARY: Bladder non tender, not distended  MUSCULOSKELETAL: No abnormal joints or musculature NEUROLOGIC: Cranial nerves 2-12 grossly intact. Moves all extremities PSYCHIATRIC: Mood and affect appropriate to situation, no behavioral issues  Patient Active Problem List   Diagnosis Date Noted  . Klebsiella pneumoniae sepsis (HCC) 12/01/2015  . Hx pulmonary embolism 12/01/2015  . Palliative care encounter   . Pain in the chest   . Weakness   . Acute on chronic renal failure (HCC) 11/21/2015  . Acute respiratory failure with hypoxia (HCC) 11/21/2015  . CKD (chronic kidney disease) stage 3, GFR 30-59 ml/min 11/18/2015  . E. coli UTI (urinary tract infection) 08/28/2015  . GERD (gastroesophageal reflux disease) 08/28/2015  . Klebsiella cystitis 08/24/2015  . ARF (acute renal failure) (HCC) 08/24/2015  . Cough 09/24/2014  . Pain in joint, ankle and foot 04/15/2014  . Cellulitis 04/10/2014  . Pure hyperglyceridemia 12/22/2013  . Renal insufficiency 11/06/2013  . Chest pain 10/26/2013  . Epigastric pain 10/26/2013  . OA (osteoarthritis) 10/03/2013  . Edema 03/03/2013  . Essential hypertension, benign 01/05/2013  . Anemia 01/05/2013  .  Glaucoma 06/06/2012  . CAD (coronary artery disease) 06/05/2012    CBC    Component Value Date/Time   WBC 7.7 11/29/2015   WBC 4.5 11/24/2015 0353   RBC 3.84* 11/24/2015 0353   HGB 12.1 11/29/2015   HCT 34* 11/29/2015   PLT 220 11/29/2015   MCV 95.6 11/24/2015 0353   LYMPHSABS 1.9 11/21/2015 1847   MONOABS 1.2* 11/21/2015 1847   EOSABS 0.1 11/21/2015 1847   BASOSABS 0.0 11/21/2015 1847    CMP     Component Value Date/Time   NA 137 11/29/2015   NA 136 11/24/2015 0353   K 3.8 11/29/2015   CL 102 11/24/2015 0353   CO2  23 11/24/2015 0353   GLUCOSE 79 11/24/2015 0353   BUN 23* 11/29/2015   BUN 20 11/24/2015 0353   CREATININE 1.5* 11/29/2015   CREATININE 1.31* 11/24/2015 0353   CALCIUM 8.6* 11/24/2015 0353   PROT 6.7 11/24/2015 0353   ALBUMIN 2.9* 11/24/2015 0353   AST 48* 11/24/2015 0353   ALT 22 11/24/2015 0353   ALKPHOS 66 11/24/2015 0353   BILITOT 0.6 11/24/2015 0353   GFRNONAA 34* 11/24/2015 0353   GFRAA 39* 11/24/2015 0353    Assessment and Plan  ASPIRATION/REFLUX  - I  Suspected reflux and ST and Har[facility] confirmed aspiration. Pt is supposed to use a valve cup to drink from otherwise she does aspirate and she has been refusing to use the cup. They have spoken with her multiple times about this. I have started pt on omeprazole 40 mg daily.   Time spent > 25 min;> 50% of time with patient was spent reviewing records, labs, tests and studies, counseling and developing plan of care  Randon Goldsmith. [name], MD

## 2016-01-18 ENCOUNTER — Encounter: Payer: Self-pay | Admitting: Internal Medicine

## 2016-02-06 ENCOUNTER — Encounter: Payer: Self-pay | Admitting: Internal Medicine

## 2016-02-06 ENCOUNTER — Non-Acute Institutional Stay (SKILLED_NURSING_FACILITY): Payer: Medicare Other | Admitting: Internal Medicine

## 2016-02-06 DIAGNOSIS — I10 Essential (primary) hypertension: Secondary | ICD-10-CM

## 2016-02-06 DIAGNOSIS — J9601 Acute respiratory failure with hypoxia: Secondary | ICD-10-CM | POA: Diagnosis not present

## 2016-02-06 DIAGNOSIS — I25118 Atherosclerotic heart disease of native coronary artery with other forms of angina pectoris: Secondary | ICD-10-CM | POA: Diagnosis not present

## 2016-02-06 NOTE — Progress Notes (Signed)
MRN: [id number] Name: [name] [name]  Sex: female Age: 82 y.o. DOB: [birth year]  PSC #:  Facility/Room: [facility] / [address] Level Of Care: SNF Provider: Randon Goldsmith. [name], MD Emergency Contacts: Extended Emergency Contact Information Primary Emergency Contact: Hall Busing Address: [address]          [place], [place] [address] [place] Home Phone: [phone] Relation: Daughter Secondary Emergency Contact: Garner,Cathy Address: [phone] [address]          [place] [address] [place] Home Phone: [phone] Work Phone: [phone] Relation: Daughter  Code Status: DNR  Allergies: Codeine; Morphine and related; and Penicillins  Chief Complaint  Patient presents with  . Medical Management of Chronic Issues    Routine Visit    HPI: Patient is 82 y.o. female who is being seen forroutine issues of CAD, respiratory failure with hypoxia and HTN.  Past Medical History  Diagnosis Date  . Hypertension   . Cataract     both eyes  . Osteoarthritis   . Glaucoma   . Poor circulation   . CAD (coronary artery disease)     a. 02/2001 PTCA & BMS to the OM1;  b. 08/2001 CBA to ISR in OM1, CBA of mid LAD;  c. 02/2002 Failed PCI of mLAD occlusion, LCX nl, patent OM1 stent, RCA dom, 40p, PDA/PL nl, EF 50% mild antlat HK.  Marland Kitchen Thyroid disease     hypothyroidism, hypothyroidism  . Hypertriglyceridemia   . UTI (urinary tract infection) 08/24/2015  . AKI (acute kidney injury) (HCC) 07/2015    Past Surgical History  Procedure Laterality Date  . Abdominal hysterectomy  1972  . Eye surgery  2007/2008    cataracts  . Cardiac surgery  2001    stent placement  . Hernia repair  2009  . Gallbladder surgery  2009  . Hip arthroplasty  06/06/2012    Procedure: ARTHROPLASTY BIPOLAR HIP;  Surgeon: Eldred Manges, MD;  Location: [facility];  Service: Orthopedics;  Laterality: Right;  monopolar      Medication List       This list is accurate as of: 02/06/16 11:59 PM.  Always  use your most recent med list.               aspirin 81 MG chewable tablet  Chew 81 mg by mouth daily.     clopidogrel 75 MG tablet  Commonly known as:  PLAVIX  Take 75 mg by mouth daily.     Dorzolamide HCl-Timolol Mal PF 22.3-6.8 MG/ML Soln  Place 1 drop into both eyes every 12 (twelve) hours.     furosemide 20 MG tablet  Commonly known as:  LASIX  Take 1 tablet (20 mg total) by mouth every other day.     ICAPS Tabs  Take 1 tablet by mouth daily.     latanoprost 0.005 % ophthalmic solution  Commonly known as:  XALATAN  Place 1 drop into both eyes at bedtime.     magnesium hydroxide 400 MG/5ML suspension  Commonly known as:  MILK OF MAGNESIA  Take 30 mLs by mouth daily as needed for mild constipation.     metoprolol tartrate 25 MG tablet  Commonly known as:  LOPRESSOR  Take 25 mg by mouth 2 (two) times daily.     omeprazole 40 MG capsule  Commonly known as:  PRILOSEC  Take 40 mg by mouth daily.     ondansetron 4 MG disintegrating tablet  Commonly known as:  ZOFRAN ODT  Take 1 tablet (4 mg total) by mouth every 8 (eight) hours as needed for nausea or vomiting.     traMADol 50 MG tablet  Commonly known as:  ULTRAM  Take 25 mg by mouth at bedtime as needed.        Meds ordered this encounter  Medications  . omeprazole (PRILOSEC) 40 MG capsule    Sig: Take 40 mg by mouth daily.    Immunization History  Administered Date(s) Administered  . Influenza Whole 05/30/2013  . Influenza-Unspecified 06/23/2014, 05/21/2015  . PPD Test 10/26/2013  . Pneumococcal-Unspecified 08/23/2013    Social History  Substance Use Topics  . Smoking status: Never Smoker   . Smokeless tobacco: Never Used  . Alcohol Use: No    Review of Systems  DATA OBTAINED: from patient, nurse GENERAL:  no fevers, fatigue, appetite changes SKIN: No itching, rash HEENT: No complaint RESPIRATORY: No cough, wheezing, SOB CARDIAC: No chest pain, palpitations, lower extremity edema  GI:  No abdominal pain, No N/V/D or constipation, No heartburn or reflux  GU: No dysuria, frequency or urgency, or incontinence  MUSCULOSKELETAL: No unrelieved bone/joint pain NEUROLOGIC: No headache, dizziness  PSYCHIATRIC: No overt anxiety or sadness  Filed Vitals:   02/06/16 1402  BP: 112/65  Pulse: 74  Temp: 97.1 F (36.2 C)  Resp: 16    Physical Exam  GENERAL APPEARANCE: Alert, mod conversant, No acute distress  SKIN: No diaphoresis rash HEENT: Unremarkable RESPIRATORY: Breathing is even, unlabored. Lung sounds are clear   CARDIOVASCULAR: Heart RRR no murmurs, rubs or gallops. No peripheral edema  GASTROINTESTINAL: Abdomen is soft, non-tender, not distended w/ normal bowel sounds.  GENITOURINARY: Bladder non tender, not distended  MUSCULOSKELETAL: No abnormal joints or musculature NEUROLOGIC: Cranial nerves 2-12 grossly intact. Moves all extremities PSYCHIATRIC: Mood and affect appropriate to situation, no behavioral issues  Patient Active Problem List   Diagnosis Date Noted  . Klebsiella pneumoniae sepsis (HCC) 12/01/2015  . Hx pulmonary embolism 12/01/2015  . Palliative care encounter   . Pain in the chest   . Weakness   . Acute on chronic renal failure (HCC) 11/21/2015  . Acute respiratory failure with hypoxia (HCC) 11/21/2015  . CKD (chronic kidney disease) stage 3, GFR 30-59 ml/min 11/18/2015  . E. coli UTI (urinary tract infection) 08/28/2015  . GERD (gastroesophageal reflux disease) 08/28/2015  . Klebsiella cystitis 08/24/2015  . ARF (acute renal failure) (HCC) 08/24/2015  . Cough 09/24/2014  . Pain in joint, ankle and foot 04/15/2014  . Cellulitis 04/10/2014  . Pure hyperglyceridemia 12/22/2013  . Renal insufficiency 11/06/2013  . Chest pain 10/26/2013  . Epigastric pain 10/26/2013  . OA (osteoarthritis) 10/03/2013  . Edema 03/03/2013  . Essential hypertension, benign 01/05/2013  . Anemia 01/05/2013  . Glaucoma 06/06/2012  . CAD (coronary artery disease)  06/05/2012    CBC    Component Value Date/Time   WBC 8.2 01/09/2016   WBC 4.5 11/24/2015 0353   RBC 3.84* 11/24/2015 0353   HGB 11.9* 01/09/2016   HCT 36 01/09/2016   PLT 294 01/09/2016   MCV 95.6 11/24/2015 0353   LYMPHSABS 1.9 11/21/2015 1847   MONOABS 1.2* 11/21/2015 1847   EOSABS 0.1 11/21/2015 1847   BASOSABS 0.0 11/21/2015 1847    CMP     Component Value Date/Time   NA 138 01/09/2016   NA 136 11/24/2015 0353   K 4.4 01/09/2016   CL 102 11/24/2015 0353   CO2 23 11/24/2015 0353   GLUCOSE  79 11/24/2015 0353   BUN 22* 01/09/2016   BUN 20 11/24/2015 0353   CREATININE 1.3* 01/09/2016   CREATININE 1.31* 11/24/2015 0353   CALCIUM 8.6* 11/24/2015 0353   PROT 6.7 11/24/2015 0353   ALBUMIN 2.9* 11/24/2015 0353   AST 48* 11/24/2015 0353   ALT 22 11/24/2015 0353   ALKPHOS 66 11/24/2015 0353   BILITOT 0.6 11/24/2015 0353   GFRNONAA 34* 11/24/2015 0353   GFRAA 39* 11/24/2015 0353    Assessment and Plan  CAD (coronary artery disease) Chronic and stable; cont lopressor 25 mg BID and ASA; pt is not on statin 2/2 age  Acute respiratory failure with hypoxia (HCC) Pt has had no resp problems since admission; does not wear O2; cont monitor  Essential hypertension, benign Controlled with lasix 20 mg and metoprolol 25 mg BID;plan - cont current meds    Valerio Pinard D. [name], MD

## 2016-02-16 ENCOUNTER — Encounter: Payer: Self-pay | Admitting: Internal Medicine

## 2016-02-29 ENCOUNTER — Encounter: Payer: Self-pay | Admitting: Internal Medicine

## 2016-02-29 ENCOUNTER — Non-Acute Institutional Stay (SKILLED_NURSING_FACILITY): Payer: Medicare Other | Admitting: Internal Medicine

## 2016-02-29 DIAGNOSIS — I10 Essential (primary) hypertension: Secondary | ICD-10-CM | POA: Diagnosis not present

## 2016-02-29 DIAGNOSIS — N183 Chronic kidney disease, stage 3 unspecified: Secondary | ICD-10-CM

## 2016-02-29 DIAGNOSIS — R946 Abnormal results of thyroid function studies: Secondary | ICD-10-CM

## 2016-02-29 DIAGNOSIS — R7989 Other specified abnormal findings of blood chemistry: Secondary | ICD-10-CM

## 2016-02-29 DIAGNOSIS — M150 Primary generalized (osteo)arthritis: Secondary | ICD-10-CM

## 2016-02-29 DIAGNOSIS — M159 Polyosteoarthritis, unspecified: Secondary | ICD-10-CM

## 2016-02-29 DIAGNOSIS — I2510 Atherosclerotic heart disease of native coronary artery without angina pectoris: Secondary | ICD-10-CM | POA: Diagnosis not present

## 2016-02-29 NOTE — Progress Notes (Signed)
[facility] Room Number: 306/W Place of Service:  SNF [phone] Provider:  Alyse LowArlo Lassen  ELKINS,WILSON OLIVER, MD  Patient Care Team: Kaleen MaskWilson Oliver Elkins, MD as PCP - General [facility](Family Medicine)  Extended Emergency Contact Information Primary Emergency Contact: Hall BusingWicker,Theresa Address: [address]          [place], [place] [address] [place] Home Phone: [phone] Relation: Daughter Secondary Emergency Contact: Garner,Cathy Address: [phone]          [place] [id number] [place] Home Phone: [phone] Work Phone: [phone] Relation: Daughter  Code Status:  DNR Goals of care: Advanced Directive information Advanced Directives 02/29/2016  Does patient have an advance directive? Yes  Type of Estate agentAdvance Directive Healthcare Power of [place];Out of facility DNR (pink MOST or yellow form)  Does patient want to make changes to advanced directive? No - Patient declined  Copy of advanced directive(s) in chart? Yes     Chief Complaint  Patient presents with  . Medical Management of Chronic Issues    Routine visit    HPI:  Pt is a 82 y.o. female seen today for Medical management of chronic medical issues including coronary artery disease-history of respiratory failure-hypertension-chronic kidney disease   Patient is quite frail but has been relatively stable recently.  She has been followed by speech therapy continues to be at risk for aspiration but this has been stable over the last few weeks Dr. Lyn HollingsheadAlexander did address this previously she is supposed to use a valve, when drinking well apparently is somewhat noncompliant with this.  Today she is sitting in her room comfortably.  She had been outside earlier and apparently machine blood pressure did give a systolic in the 80s-however I did check it manually when she came inside she appeared to be doing fine her blood pressure was stable at 120/60 she did not complain of any  syncopal episodes dizziness or feeling increasingly weak  In regards to coronary artery disease she is on Lasix 20 mg every other day with some mild lower extremity edema which appears unchanged she also is on Lopressor 25 mg twice a day-as well as Plavix 75 mg a day and aspirin 81 mg a day.  4 hypertension again she is on Lopressor machine did give a low systolic today but I got 120/60 manually again she is on Lopressor 25 mg twice a day we'll order more frequent blood pressure checks to keep benign on this.  Regardless GERD she is on Prilosec 20 mg a day this appears to be stable as well.  She does have a history of chronic kidney disease recent creatinine of 1.3 on lab done Jan 09 2016 appears to be relatively baseline.  She also at times has an history of an elevated TSH  back in May was 13.56 over free T4 was near normal at 0.9 we will update this.       Past Medical History  Diagnosis Date  . Hypertension   . Cataract     both eyes  . Osteoarthritis   . Glaucoma   . Poor circulation   . CAD (coronary artery disease)     a. 02/2001 PTCA & BMS to the OM1;  b. 08/2001 CBA to ISR in OM1, CBA of mid LAD;  c. 02/2002 Failed PCI of mLAD occlusion, LCX nl, patent OM1 stent, RCA dom, 40p, PDA/PL nl, EF 50% mild antlat HK.  Marland Kitchen. Thyroid disease     hypothyroidism,  hypothyroidism  . Hypertriglyceridemia   . UTI (urinary tract infection) 08/24/2015  . AKI (acute kidney injury) (HCC) 07/2015   Past Surgical History  Procedure Laterality Date  . Abdominal hysterectomy  1972  . Eye surgery  2007/2008    cataracts  . Cardiac surgery  2001    stent placement  . Hernia repair  2009  . Gallbladder surgery  2009  . Hip arthroplasty  06/06/2012    Procedure: ARTHROPLASTY BIPOLAR HIP;  Surgeon: Eldred Manges, MD;  Location: [facility];  Service: Orthopedics;  Laterality: Right;  monopolar    Allergies  Allergen Reactions  . Codeine Nausea And Vomiting  . Morphine And Related Nausea And Vomiting   . Penicillins Hives      Medication List       This list is accurate as of: 02/29/16  4:46 PM.  Always use your most recent med list.               acetaminophen 500 MG tablet  Commonly known as:  TYLENOL  Take 500 mg by mouth every 8 (eight) hours as needed.     aspirin 81 MG chewable tablet  Chew 81 mg by mouth daily.     clopidogrel 75 MG tablet  Commonly known as:  PLAVIX  Take 75 mg by mouth daily.     Dorzolamide HCl-Timolol Mal PF 22.3-6.8 MG/ML Soln  Place 1 drop into both eyes every 12 (twelve) hours.     furosemide 20 MG tablet  Commonly known as:  LASIX  Take 1 tablet (20 mg total) by mouth every other day.     ICAPS Tabs  Take 1 tablet by mouth daily.     latanoprost 0.005 % ophthalmic solution  Commonly known as:  XALATAN  Place 1 drop into both eyes at bedtime.     magnesium hydroxide 400 MG/5ML suspension  Commonly known as:  MILK OF MAGNESIA  Take 30 mLs by mouth daily as needed for mild constipation.     metoprolol tartrate 25 MG tablet  Commonly known as:  LOPRESSOR  Take 25 mg by mouth 2 (two) times daily.     omeprazole 40 MG capsule  Commonly known as:  PRILOSEC  Take 40 mg by mouth daily.     ondansetron 4 MG disintegrating tablet  Commonly known as:  ZOFRAN ODT  Take 1 tablet (4 mg total) by mouth every 8 (eight) hours as needed for nausea or vomiting.     traMADol 50 MG tablet  Commonly known as:  ULTRAM  Take 25 mg by mouth at bedtime as needed.        Review of Systems   This is somewhat limited secondary to patient being a poor historian.  In general does not complaining of any fever or chills.  Skin does not complain of rashes or itching she is not diaphoretic.  Head ears eyes nose mouth and throat says her vision is not what it used to be but does not any acute visual changes.  Respiratory is not complaining of any shortness breath or cough.  Cardiac denies chest pain.  GI is not complaining of nausea vomiting  diarrhea constipation or abdominal discomfort.  GU does not planning of dysuria.  Muscle skeletal does have a history of generalized osteoarthritis but is not complaining of pain currently and does know wheelchair.  Neurologic is not complaining of dizziness headache or syncopal-type feelings.  Psych does appear to have some cognitive decline but does  not complaining of any overt anxiety or depression  Immunization History  Administered Date(s) Administered  . Influenza Whole 05/30/2013  . Influenza-Unspecified 06/23/2014, 05/21/2015  . PPD Test 10/26/2013  . Pneumococcal-Unspecified 08/23/2013   Pertinent  Health Maintenance Due  Topic Date Due  . PNA vac Low Risk Adult (2 of 2 - PCV13) 11/21/2016 (Originally 08/23/2014)  . DEXA SCAN  01/06/2017 (Originally 08/16/1986)  . INFLUENZA VACCINE  03/25/2016   No flowsheet data found. Functional Status Survey:    Filed Vitals:   02/29/16 1645  BP: 120/60  Pulse: 65  Temp: 97.9 F (36.6 C)  TempSrc: Oral  Resp: 18   There is no weight on file to calculate BMI. Physical Exam   In general this is a pleasant elderly female in no distress sitting comfortably in her wheelchair.  Her skin is warm and dry she is not diaphoretic.  Eyes pupils appear reactive light is will daily appears grossly intact area  Oropharynx clear mucous membranes moist.  Chest is clear to auscultation there is no labored breathing.  Heart is regular rate and rhythm with occasional irregular beat I would say she has trace-1+ lower extremity edema which appears to be relatively baseline.  Abdomen soft nontender with positive bowel sounds.  Muscle skeletal does move all her extremities 4 at baseline does ambulate in a wheelchair has some lower extremity weakness which is not new.  Neurologic no lateralizing findings her speech is clear cranial nerves are grossly intact.  Psych she is oriented to self is conversant but does have periods of confusion  can carry on a fairly simple conversation  Labs reviewed:  Recent Labs  11/21/15 1847 11/23/15 0450 11/24/15 0353 11/29/15 01/09/16  NA 136 136 136 137 138  K 4.7 4.3 3.9 3.8 4.4  CL 98* 103 102  --   --   CO2 26 26 23   --   --   GLUCOSE 125* 90 79  --   --   BUN 32* 31* 20 23* 22*  CREATININE 2.02* 1.77* 1.31* 1.5* 1.3*  CALCIUM 9.1 8.6* 8.6*  --   --     Recent Labs  06/08/15 08/24/15 0938 11/24/15 0353  AST 24 27 48*  ALT 16 18 22   ALKPHOS 64 86 66  BILITOT  --  0.5 0.6  PROT  --  7.9 6.7  ALBUMIN  --  3.3* 2.9*    Recent Labs  08/24/15 0938  11/21/15 1847 11/23/15 0450 11/24/15 0353 11/29/15 01/09/16  WBC 11.2*  < > 10.4 5.3 4.5 7.7 8.2  NEUTROABS 9.3*  --  7.2  --   --   --   --   HGB 12.3  < > 12.1 10.7* 11.4* 12.1 11.9*  HCT 38.6  < > 39.0 35.2* 36.7 34* 36  MCV 95.8  < > 97.7 97.5 95.6  --   --   PLT 253  < > 218 195 199 220 294  < > = values in this interval not displayed. Lab Results  Component Value Date   TSH 13.56* 01/09/2016  Note free T4 was 0.9 No results found for: HGBA1C Lab Results  Component Value Date   CHOL 153 12/07/2015   HDL 30* 12/07/2015   LDLCALC 81 12/07/2015   TRIG 210* 12/07/2015   CHOLHDL 5.2 10/27/2013    Significant Diagnostic Results in last 30 days:  No results found.  Assessment/Plan  #1 coronary artery disease-this appears to be stable and chronic she is on  Lopressor 25 mg twice a day as well as aspirin and Plavix she is not on a statin secondary to her advanced age. Of note she does continue on Lasix as well will update a metabolic panel next lab day  2 history of acute respiratory failure with hypoxia-this appears to be stable again she does remain somewhat of an aspiration risk but this has been stable now for a while-.  #3 hypertension blood pressure manually today was satisfactory at 120/60-machine did get low systolic earlier-will order blood pressure checks twice a day with a log for review next week to  see where we stand.  #4 history of renal insufficiency recent creatinine 1.3 appears to be relatively baseline Will update a metabolic panel next lab day.  #5 history of anemia this appears stable with a hemoglobin of 11.9 on lab done in May will update this as well.  #6 history of GERD she is stable it appears on Prilosec 20 mg a day-she also has a when necessary order for Zofran if needed.  #7 history of generalized osteoarthritis she does receive tramadol daily at bedtime this appears to be helping she does not appear to have significant discomfort today but this will have to be watched.   Number 8 history of elevated TSH-she does have a history of this however free T4 has been around normal limits at 0.9 will update these labs-secondary to patient's advanced age and relative stability will be fairly conservative here  #9 dementia-this appears to be slowly progressive however she appears to be doing relatively well with supportive care continued to be quite frail and vulnerable and per previous discussions with family they are aware of this---     [id number]--of note greater than 35 minutes spent assessing patient-reviewing her chart-discussing her status with nursing staff-reviewing labs-and discussion with her son at bedside about patient's status-of note greater than 50% of time spent coordinating plan of care and formulating a plan of care for numerous diagnoses      London Sheer, CMA [phone]

## 2016-03-05 LAB — HEPATIC FUNCTION PANEL
ALK PHOS: 76 U/L (ref 25–125)
ALT: 9 U/L (ref 7–35)
AST: 18 U/L (ref 13–35)
BILIRUBIN, TOTAL: 0.2 mg/dL

## 2016-03-05 LAB — TSH: TSH: 13.05 u[IU]/mL — AB (ref <=5.90)

## 2016-03-05 LAB — CBC AND DIFFERENTIAL
HCT: 30 % — AB (ref 36–46)
HEMOGLOBIN: 10.3 g/dL — AB (ref 12.0–16.0)
Platelets: 247 10*3/uL (ref 150–399)
WBC: 8.5 10^3/mL

## 2016-03-05 LAB — BASIC METABOLIC PANEL
BUN: 36 mg/dL — AB (ref 4–21)
CREATININE: 1.7 mg/dL — AB (ref <=1.1)
Glucose: 103 mg/dL
POTASSIUM: 4.4 mmol/L (ref 3.4–5.3)
SODIUM: 138 mmol/L (ref 137–147)

## 2016-03-06 ENCOUNTER — Encounter: Payer: Self-pay | Admitting: Internal Medicine

## 2016-03-06 ENCOUNTER — Non-Acute Institutional Stay (SKILLED_NURSING_FACILITY): Payer: Medicare Other | Admitting: Internal Medicine

## 2016-03-06 DIAGNOSIS — D649 Anemia, unspecified: Secondary | ICD-10-CM

## 2016-03-06 DIAGNOSIS — N289 Disorder of kidney and ureter, unspecified: Secondary | ICD-10-CM

## 2016-03-06 DIAGNOSIS — R609 Edema, unspecified: Secondary | ICD-10-CM | POA: Diagnosis not present

## 2016-03-06 NOTE — Progress Notes (Signed)
Patient ID: [name] [name], female   DOB: 1918-12-25, 82 y.o.   MRN: [id number]  Location:   [facility]   Place of Service:  SNF [id number]) Provider:  Alyse Low, MD  Patient Care Team: Kaleen Mask, MD as PCP - General [facility])  Extended Emergency Contact Information Primary Emergency Contact: Hall Busing Address: [address]          [place], [place] [address] [place] Home Phone: [phone] Relation: Daughter Secondary Emergency Contact: Garner,Cathy Address: [phone]          [place] [address] [place] Home Phone: [phone] Work Phone: [phone] Relation: Daughter  Code Status:  DNR Goals of care: Advanced Directive information Advanced Directives 02/29/2016  Does patient have an advance directive? Yes  Type of Estate agent of [place];Out of facility DNR (pink MOST or yellow form)  Does patient want to make changes to advanced directive? No - Patient declined  Copy of advanced directive(s) in chart? Yes     Chief Complaint  Patient presents with  . Acute Visit  Secondary to renal insufficiency-left leg edema-  HPI:  Pt is a 82 y.o. female seen today for renal insufficiency-baseline does have a history of chronic kidney disease with a baseline creatinine around 1.3 on lab done on July 12 creatinine has gone up somewhat to 1.71 with a BUN of 36.  She does have a history of chronic lower extremity edema is on Lasix 20 mg 3 times a week.  Apparently her by mouth intake and fluid intake is somewhat spotty at times she does have what appears to be some progressive dementia.  She does have a history coronary artery disease does continue on a beta blocker as well as Plavix and aspirin-she does not complain of any chest pain currently or shortness of breath.   .             Past Medical History  Diagnosis Date  . Hypertension   . Cataract     both eyes  .  Osteoarthritis   . Glaucoma   . Poor circulation   . CAD (coronary artery disease)     a. 02/2001 PTCA & BMS to the OM1;  b. 08/2001 CBA to ISR in OM1, CBA of mid LAD;  c. 02/2002 Failed PCI of mLAD occlusion, LCX nl, patent OM1 stent, RCA dom, 40p, PDA/PL nl, EF 50% mild antlat HK.  Marland Kitchen Thyroid disease     hypothyroidism, hypothyroidism  . Hypertriglyceridemia   . UTI (urinary tract infection) 08/24/2015  . AKI (acute kidney injury) (HCC) 07/2015   Past Surgical History  Procedure Laterality Date  . Abdominal hysterectomy  1972  . Eye surgery  2007/2008    cataracts  . Cardiac surgery  2001    stent placement  . Hernia repair  2009  . Gallbladder surgery  2009  . Hip arthroplasty  06/06/2012    Procedure: ARTHROPLASTY BIPOLAR HIP;  Surgeon: Eldred Manges, MD;  Location: [facility];  Service: Orthopedics;  Laterality: Right;  monopolar    Allergies  Allergen Reactions  . Codeine Nausea And Vomiting  . Morphine And Related Nausea And Vomiting  . Penicillins Hives      Medication List       This list is accurate as of: 03/06/16 11:59 PM.  Always use your most recent med list.  acetaminophen 500 MG tablet  Commonly known as:  TYLENOL  Take 500 mg by mouth every 8 (eight) hours as needed.     aspirin 81 MG chewable tablet  Chew 81 mg by mouth daily.     clopidogrel 75 MG tablet  Commonly known as:  PLAVIX  Take 75 mg by mouth daily.     Dorzolamide HCl-Timolol Mal PF 22.3-6.8 MG/ML Soln  Place 1 drop into both eyes every 12 (twelve) hours.     furosemide 20 MG tablet  Commonly known as:  LASIX  Take 1 tablet (20 mg total) by mouth every other day.     ICAPS Tabs  Take 1 tablet by mouth daily.     latanoprost 0.005 % ophthalmic solution  Commonly known as:  XALATAN  Place 1 drop into both eyes at bedtime.     magnesium hydroxide 400 MG/5ML suspension  Commonly known as:  MILK OF MAGNESIA  Take 30 mLs by mouth daily as needed for mild constipation.       metoprolol tartrate 25 MG tablet  Commonly known as:  LOPRESSOR  Take 25 mg by mouth 2 (two) times daily.     omeprazole 40 MG capsule  Commonly known as:  PRILOSEC  Take 40 mg by mouth daily.     ondansetron 4 MG disintegrating tablet  Commonly known as:  ZOFRAN ODT  Take 1 tablet (4 mg total) by mouth every 8 (eight) hours as needed for nausea or vomiting.     traMADol 50 MG tablet  Commonly known as:  ULTRAM  Take 25 mg by mouth at bedtime as needed.        Review of Systems   This is somewhat limited secondary to patient being a poor historian.  In general does not complaining of any fever or chills.  Skin does not complain of rashes or itching she is not diaphoretic.  Head ears eyes nose mouth and throat says her vision is not what it used to be but does not any acute visual changes.  Respiratory is not complaining of any shortness breath or cough.  Cardiac denies chest pain.Appears to have chronic lower extremity edema although bit more on the left versus the right  GI is not complaining of nausea vomiting diarrhea constipation or abdominal discomfort.  GU does not planning of dysuria.  Muscle skeletal does have a history of generalized osteoarthritis but is not complaining of pain currently   Neurologic is not complaining of dizziness headache or syncopal-type feelings.  Psych does appear to have some cognitive decline but does not complaining of any overt anxiety or depression  Immunization History  Administered Date(s) Administered  . Influenza Whole 05/30/2013  . Influenza-Unspecified 06/23/2014, 05/21/2015  . PPD Test 10/26/2013  . Pneumococcal-Unspecified 08/23/2013   Pertinent  Health Maintenance Due  Topic Date Due  . PNA vac Low Risk Adult (2 of 2 - PCV13) 11/21/2016 (Originally 08/23/2014)  . DEXA SCAN  01/06/2017 (Originally 08/16/1986)  . INFLUENZA VACCINE  03/25/2016   No flowsheet data found. Functional Status Survey:    Filed Vitals:    03/06/16 2200  BP: 120/55  Pulse: 67  Temp: 97.4 F (36.3 C)  Resp: 17    Physical Exam   In general this is a pleasant elderly female in no distress sitting comfortably in her wheelchair.  Her skin is warm and dry she is not diaphoretic.  Eyes pupils appear reactive light is will daily appears grossly intact area  Oropharynx  clear mucous membranes moist.  Chest is clear to auscultation there is no labored breathing.  Heart is regular rate and rhythm with occasional irregular beat I would say she has trace Lower extremity edema on the right eye was say one plus on the left pedal pulses somewhat reduced on the left there is no erythema or acute tenderness to palpation here  Abdomen soft nontender with positive bowel sounds.  Muscle skeletal does move all her extremities 4 at baseline does ambulate in a wheelchair has some lower extremity weakness which is not new.  Neurologic no lateralizing findings her speech is clear cranial nerves are grossly intact.  Psych she is oriented to self is conversant but does have periods of confusion can carry on a fairly simple conversation  Labs reviewed:  03/05/2016.  WBC 8.5 hemoglobin 10.3 platelets 247.  Sodium 138 potassium 4.4 BUN 36 creatinine 1.71.  TSH as 13.07.  Free T4-0.7  Recent Labs  11/21/15 1847 11/23/15 0450 11/24/15 0353 11/29/15 01/09/16  NA 136 136 136 137 138  K 4.7 4.3 3.9 3.8 4.4  CL 98* 103 102  --   --   CO2 26 26 23   --   --   GLUCOSE 125* 90 79  --   --   BUN 32* 31* 20 23* 22*  CREATININE 2.02* 1.77* 1.31* 1.5* 1.3*  CALCIUM 9.1 8.6* 8.6*  --   --     Recent Labs  06/08/15 08/24/15 0938 11/24/15 0353  AST 24 27 48*  ALT 16 18 22   ALKPHOS 64 86 66  BILITOT  --  0.5 0.6  PROT  --  7.9 6.7  ALBUMIN  --  3.3* 2.9*    Recent Labs  08/24/15 0938  11/21/15 1847 11/23/15 0450 11/24/15 0353 11/29/15 01/09/16  WBC 11.2*  < > 10.4 5.3 4.5 7.7 8.2  NEUTROABS 9.3*  --  7.2  --   --   --    --   HGB 12.3  < > 12.1 10.7* 11.4* 12.1 11.9*  HCT 38.6  < > 39.0 35.2* 36.7 34* 36  MCV 95.8  < > 97.7 97.5 95.6  --   --   PLT 253  < > 218 195 199 220 294  < > = values in this interval not displayed. Lab Results  Component Value Date   TSH 13.56* 01/09/2016  Note free T4 was 0.9 No results found for: HGBA1C Lab Results  Component Value Date   CHOL 153 12/07/2015   HDL 30* 12/07/2015   LDLCALC 81 12/07/2015   TRIG 210* 12/07/2015   CHOLHDL 5.2 10/27/2013    Significant Diagnostic Results in last 30 days:  No results found.  Assessment/Plan  #1-renal insufficiency creatinine appears to be slowly rising-this was discussed with Dr. [name] via phone reduce her Lasix from essentially 3 times a week down to 2 times a week on Monday and Friday-encourage by mouth intake-also obtain a BMP next week for follow-up.  #2-left lower extremity edema-questionably slightly increased-will order venous Doppler rule out DVT.  #3 anemia--recent hemoglobins have ranged from 10.7 up to 12.1-hemoglobin 10.3 on lab done on July 12 his lower end of baseline Will update this next week as well to see if this is a true trend.  #4-history of hypothyroidism patient has a history of elevated TSH is however free T4 is  near normal at 0.7 at this point will monitor-conservative here secondary to patient's advanced age and comorbidities  [id number]       .  Edmon CrapeArlo Temika Sutphin, PA-C [phone]

## 2016-03-12 LAB — BASIC METABOLIC PANEL
BUN: 34 mg/dL — AB (ref 4–21)
CREATININE: 2.1 mg/dL — AB (ref 0.5–1.1)
Glucose: 106 mg/dL
Potassium: 4.5 mmol/L (ref 3.4–5.3)
SODIUM: 138 mmol/L (ref 137–147)

## 2016-03-12 LAB — CBC AND DIFFERENTIAL
HEMATOCRIT: 29 % — AB (ref 36–46)
Hemoglobin: 10.5 g/dL — AB (ref 12.0–16.0)
PLATELETS: 317 10*3/uL (ref 150–399)
WBC: 7.5 10^3/mL

## 2016-03-13 ENCOUNTER — Encounter: Payer: Self-pay | Admitting: Internal Medicine

## 2016-03-13 ENCOUNTER — Non-Acute Institutional Stay (SKILLED_NURSING_FACILITY): Payer: Medicare Other | Admitting: Internal Medicine

## 2016-03-13 DIAGNOSIS — R609 Edema, unspecified: Secondary | ICD-10-CM | POA: Diagnosis not present

## 2016-03-13 DIAGNOSIS — N289 Disorder of kidney and ureter, unspecified: Secondary | ICD-10-CM

## 2016-03-13 NOTE — Progress Notes (Signed)
[facility] Room Number: 306/W Place of Service:  SNF [phone](31) Provider:  Alyse LowArlo Harmonii [name]  [name],[name] OLIVER, MD  Patient Care Team: Kaleen MaskWilson Oliver Elkins, MD as PCP - General [facility](Family [facility])  Extended Emergency Contact Information Primary Emergency Contact: [name] BusingWicker,Theresa Address: [address]          [place], [place] [address] [name] Home Phone: [phone] Relation: Daughter Secondary Emergency Contact: [name],[name] Address: [phone]          [place] [id number] [place] Home Phone: [phone] Work Phone: [phone] Relation: Daughter  Code Status:  DNR Goals of care: Advanced Directive information Advanced Directives 03/13/2016  Does patient have an advance directive? Yes  Type of Estate agentAdvance Directive Healthcare Power of [place];Out of facility DNR (pink MOST or yellow form)  Does patient want to make changes to advanced directive? No - Patient declined  Copy of advanced directive(s) in chart? Yes     Chief Complaint  Patient presents with  . Acute Visit    Renal Insufficiency    HPI:  Pt is a 82 y.o. female seen today for an acute visit for Follow-up renal insufficiency--she does have a history of chronic renal insufficiency with baseline creatinine in the mid ones but this has been slowly rising have been up to 1.71 last week and all lab done on July 18 had risen up to 2.12.  She had been on Lasix 3 times a day we did reduce this down to 2 times a day.  She does have a history of lower extremity edema which is somewhat persistent.  Currently vital signs are stable she does not complain of any increased shortness breath or weakness-she does have a history of dementia as well as coronary artery disease) appears to be gradually declining in regards to her cognitive function and physical abilities.     Past Medical History  Diagnosis Date  . Hypertension   . Cataract     both eyes  . Osteoarthritis    . Glaucoma   . Poor circulation   . CAD (coronary artery disease)     a. 02/2001 PTCA & BMS to the OM1;  b. 08/2001 CBA to ISR in OM1, CBA of mid LAD;  c. 02/2002 Failed PCI of mLAD occlusion, LCX nl, patent OM1 stent, RCA dom, 40p, PDA/PL nl, EF 50% mild antlat HK.  Marland Kitchen. Thyroid disease     hypothyroidism, hypothyroidism  . Hypertriglyceridemia   . UTI (urinary tract infection) 08/24/2015  . AKI (acute kidney injury) (HCC) 07/2015   Past Surgical History  Procedure Laterality Date  . Abdominal hysterectomy  1972  . Eye surgery  2007/2008    cataracts  . Cardiac surgery  2001    stent placement  . Hernia repair  2009  . Gallbladder surgery  2009  . Hip arthroplasty  06/06/2012    Procedure: ARTHROPLASTY BIPOLAR HIP;  Surgeon: Eldred MangesMark C Yates, MD;  Location: [facility];  Service: Orthopedics;  Laterality: Right;  monopolar    Allergies  Allergen Reactions  . Codeine Nausea And Vomiting  . Morphine And Related Nausea And Vomiting  . Penicillins Hives      Medication List       This list is accurate as of: 03/13/16  2:19 PM.  Always use your most recent med list.               acetaminophen 500 MG tablet  Commonly known as:  TYLENOL  Take 500 mg by mouth every 8 (eight) hours as needed.     aspirin 81 MG chewable tablet  Chew 81 mg by mouth daily.     clopidogrel 75 MG tablet  Commonly known as:  PLAVIX  Take 75 mg by mouth daily.     Dorzolamide HCl-Timolol Mal PF 22.3-6.8 MG/ML Soln  Place 1 drop into both eyes every 12 (twelve) hours.     furosemide 20 MG tablet  Commonly known as:  LASIX  Take 1 tablet (20 mg total) by mouth every other day.     ICAPS Tabs  Take 1 tablet by mouth daily.     latanoprost 0.005 % ophthalmic solution  Commonly known as:  XALATAN  Place 1 drop into both eyes at bedtime.     magnesium hydroxide 400 MG/5ML suspension  Commonly known as:  MILK OF MAGNESIA  Take 30 mLs by mouth daily as needed for mild constipation.     metoprolol  tartrate 25 MG tablet  Commonly known as:  LOPRESSOR  Take 25 mg by mouth 2 (two) times daily.     omeprazole 40 MG capsule  Commonly known as:  PRILOSEC  Take 40 mg by mouth daily.     ondansetron 4 MG disintegrating tablet  Commonly known as:  ZOFRAN ODT  Take 1 tablet (4 mg total) by mouth every 8 (eight) hours as needed for nausea or vomiting.     traMADol 50 MG tablet  Commonly known as:  ULTRAM  Give 1/2 tablet ( 25 mg )by mouth daily PRN neck pain        Review of Systems  This is limited secondary to patient being a poor historian  In general does not complaining of any fever or chills.  Skin does not complain of rashes or itching she is not diaphoretic.     Respiratory is not complaining of any shortness breath or cough.  Cardiac denies chest pain.Appears to have chronic lower extremity edema  bit more on the left versus the right  GI is not complaining of nausea vomiting diarrhea constipation or abdominal discomfort.    Muscle skeletal does have a history of generalized osteoarthritis but is not complaining of pain currently   Neurologic is not complaining of dizziness headache or syncopal-type feelings.  Psych does appear to have some cognitive decline but does not complaining of any overt anxiety or depression  Immunization History  Administered Date(s) Administered  . Influenza Whole 05/30/2013  . Influenza-Unspecified 06/23/2014, 05/21/2015  . PPD Test 10/26/2013  . Pneumococcal-Unspecified 08/23/2013   Pertinent  Health Maintenance Due  Topic Date Due  . PNA vac Low Risk Adult (2 of 2 - PCV13) 11/21/2016 (Originally 08/23/2014)  . DEXA SCAN  01/06/2017 (Originally 08/16/1986)  . INFLUENZA VACCINE  03/25/2016   No flowsheet data found. Functional Status Survey:    Filed Vitals:   03/13/16 1418  BP: 126/70  Pulse: 71  Resp: 20  On exam pulse was 58 There is no weight on file to calculate BMI. Physical Exam  In general this is  a pleasant elderly female in no distress sitting comfortably in her wheelchair.  Her skin is warm and dry she is not diaphoretic.  Eyes pupils appear reactive light is will daily appears grossly intact area  Oropharynx clear mucous membranes moist.  Chest is clear to auscultation there is no labored breathing.  Heart is regular rate and rhythm with occasional irregular bea slightly bradycardic in the high 50s today  t I would say she has trace Lower extremity edema on the right eye was say one plus on the left pedal pulses somewhat reduced on the left there is no erythema or acute tenderness to palpation here  Abdomen soft nontender with positive bowel sounds.  Muscle skeletal does move all her extremities 4 at baseline does ambulate in a wheelchair has some lower extremity weakness which is not new.  Neurologic no lateralizing findings her speech is clear cranial nerves are grossly intact.  Psych she is oriented to self is conversant but does have periods of confusion can carry on a fairly simple conversation  Labs reviewed:  Recent Labs  11/21/15 1847 11/23/15 0450 11/24/15 0353 11/29/15 01/09/16 03/05/16  NA 136 136 136 137 138 138  K 4.7 4.3 3.9 3.8 4.4 4.4  CL 98* 103 102  --   --   --   CO2 --   --   --   GLUCOSE 125* 90 79  --   --   --   BUN 32* 31* 20 23* 22* 36*  CREATININE 2.02* 1.77* 1.31* 1.5* 1.3* 1.7*  CALCIUM 9.1 8.6* 8.6*  --   --   --     Recent Labs  08/24/15 0938 11/24/15 0353 03/05/16  AST 27 48* 18  ALT ALKPHOS 86 66 76  BILITOT 0.5 0.6  --   PROT 7.9 6.7  --   ALBUMIN 3.3* 2.9*  --     Recent Labs  08/24/15 0938  11/21/15 1847 11/23/15 0450 11/24/15 0353 11/29/15 01/09/16 03/05/16  WBC 11.2*  < > 10.4 5.3 4.5 7.7 8.2 8.5  NEUTROABS 9.3*  --  7.2  --   --   --   --   --   HGB 12.3  < > 12.1 10.7* 11.4* 12.1 11.9* 10.3*  HCT 38.6  < > 39.0 35.2* 36.7 34* 36 30*  MCV 95.8  < > 97.7 97.5 95.6  --   --   --     PLT 253  < > 218 195 199 220 294 247  < > = values in this interval not displayed. Lab Results  Component Value Date   TSH 13.05* 03/05/2016   No results found for: HGBA1C Lab Results  Component Value Date   CHOL 153 12/07/2015   HDL 30* 12/07/2015   LDLCALC 81 12/07/2015   TRIG 210* 12/07/2015   CHOLHDL 5.2 10/27/2013    Significant Diagnostic Results in last 30 days:  No results found.  Assessment/Plan  Increasing renal insufficiency-this was discussed with Dr. [name] via phone-this is somewhat challenging with her history of chronic lower extremity edema-her Lasix had been reduced to twice a day at this point will discontinue her Lasix-an update a BMP next week to see where we stand-also continue to monitor edema and clinical status-clinically she appears to be stable although in general declining somewhat  [id number]    London Sheer, [place] [phone]

## 2016-03-18 LAB — BASIC METABOLIC PANEL
BUN: 36 mg/dL — AB (ref 4–21)
CREATININE: 1.8 mg/dL — AB (ref 0.5–1.1)
Glucose: 120 mg/dL
POTASSIUM: 4.6 mmol/L (ref 3.4–5.3)
SODIUM: 138 mmol/L (ref 137–147)

## 2016-03-31 ENCOUNTER — Encounter: Payer: Self-pay | Admitting: Internal Medicine

## 2016-03-31 ENCOUNTER — Non-Acute Institutional Stay (SKILLED_NURSING_FACILITY): Payer: Medicare Other | Admitting: Internal Medicine

## 2016-03-31 DIAGNOSIS — H409 Unspecified glaucoma: Secondary | ICD-10-CM | POA: Diagnosis not present

## 2016-03-31 DIAGNOSIS — N183 Chronic kidney disease, stage 3 unspecified: Secondary | ICD-10-CM

## 2016-03-31 DIAGNOSIS — K219 Gastro-esophageal reflux disease without esophagitis: Secondary | ICD-10-CM

## 2016-03-31 NOTE — Progress Notes (Signed)
MRN: [id number] Name: [name] [name]  Sex: female Age: 82 y.o. DOB: 13-Mar-1919  PSC #:  Facility/Room: [facility] / [address] Level Of Care: SNF Provider: Randon Goldsmith. [name], MD Emergency Contacts: Extended Emergency Contact Information Primary Emergency Contact: Hall Busing Address: [address]          [place], [place] [address] [place] Home Phone: [phone] Relation: Daughter Secondary Emergency Contact: Garner,Cathy Address: [phone]          [place] [address] [place] Home Phone: [phone] Work Phone: [phone] Relation: Daughter  Code Status: DNR  Allergies: Codeine; Morphine and related; and Penicillins  Chief Complaint  Patient presents with  . Medical Management of Chronic Issues    Routine Visit    HPI: Patient is 82 y.o. female who is being seen for routine issues of GERD, CKD3 and glaucoma.  Past Medical History:  Diagnosis Date  . AKI (acute kidney injury) (HCC) 07/2015  . CAD (coronary artery disease)    a. 02/2001 PTCA & BMS to the OM1;  b. 08/2001 CBA to ISR in OM1, CBA of mid LAD;  c. 02/2002 Failed PCI of mLAD occlusion, LCX nl, patent OM1 stent, RCA dom, 40p, PDA/PL nl, EF 50% mild antlat HK.  . Cataract    both eyes  . Glaucoma   . Hypertension   . Hypertriglyceridemia   . Osteoarthritis   . Poor circulation   . Thyroid disease    hypothyroidism, hypothyroidism  . UTI (urinary tract infection) 08/24/2015    Past Surgical History:  Procedure Laterality Date  . ABDOMINAL HYSTERECTOMY  1972  . CARDIAC SURGERY  2001   stent placement  . EYE SURGERY  2007/2008   cataracts  . GALLBLADDER SURGERY  2009  . HERNIA REPAIR  2009  . HIP ARTHROPLASTY  06/06/2012   Procedure: ARTHROPLASTY BIPOLAR HIP;  Surgeon: Eldred Manges, MD;  Location: [facility];  Service: Orthopedics;  Laterality: Right;  monopolar      Medication List       Accurate as of 03/31/16 11:59 PM. Always use your most recent med list.           acetaminophen 500 MG tablet Commonly known as:  TYLENOL Take 500 mg by mouth every 8 (eight) hours as needed.   aspirin 81 MG chewable tablet Chew 81 mg by mouth daily.   clopidogrel 75 MG tablet Commonly known as:  PLAVIX Take 75 mg by mouth daily.   Dorzolamide HCl-Timolol Mal PF 22.3-6.8 MG/ML Soln Place 1 drop into both eyes every 12 (twelve) hours.   ICAPS Tabs Take 1 tablet by mouth daily.   latanoprost 0.005 % ophthalmic solution Commonly known as:  XALATAN Place 1 drop into both eyes at bedtime.   metoprolol tartrate 25 MG tablet Commonly known as:  LOPRESSOR Take 25 mg by mouth 2 (two) times daily.   omeprazole 40 MG capsule Commonly known as:  PRILOSEC Take 40 mg by mouth daily.   ondansetron 4 MG disintegrating tablet Commonly known as:  ZOFRAN ODT Take 1 tablet (4 mg total) by mouth every 8 (eight) hours as needed for nausea or vomiting.   traMADol 50 MG tablet Commonly known as:  ULTRAM Give 1/2 tablet ( 25 mg )by mouth daily PRN neck pain       No orders of the defined types were placed in this encounter.   Immunization History  Administered Date(s) Administered  . Influenza Whole 05/30/2013  . Influenza-Unspecified  06/23/2014, 05/21/2015  . PPD Test 10/26/2013  . Pneumococcal-Unspecified 08/23/2013    Social History  Substance Use Topics  . Smoking status: Never Smoker  . Smokeless tobacco: Never Used  . Alcohol use No    Review of Systems  DATA OBTAINED: from patient GENERAL:  no fevers, fatigue, appetite changes SKIN: No itching, rash HEENT: No complaint RESPIRATORY: No cough, wheezing, SOB CARDIAC: No chest pain, palpitations, lower extremity edema  GI: No abdominal pain, No N/V/D or constipation, No heartburn or reflux  GU: No dysuria, frequency or urgency, or incontinence  MUSCULOSKELETAL: No unrelieved bone/joint pain NEUROLOGIC: No headache, dizziness  PSYCHIATRIC: No overt anxiety or sadness  Vitals:   03/31/16 1417   BP: 128/67  Pulse: 79  Resp: 16  Temp: 98.4 F (36.9 C)    Physical Exam  GENERAL APPEARANCE: Alert, conversant, No acute distress  SKIN: No diaphoresis rash HEENT: Unremarkable RESPIRATORY: Breathing is even, unlabored. Lung sounds are clear   CARDIOVASCULAR: Heart RRR no murmurs, rubs or gallops. No peripheral edema  GASTROINTESTINAL: Abdomen is soft, non-tender, not distended w/ normal bowel sounds.  GENITOURINARY: Bladder non tender, not distended  MUSCULOSKELETAL: No abnormal joints or musculature NEUROLOGIC: Cranial nerves 2-12 grossly intact. Moves all extremities PSYCHIATRIC: Mood and affect appropriate to situation with some dementia, no behavioral issues  Patient Active Problem List   Diagnosis Date Noted  . Klebsiella pneumoniae sepsis (HCC) 12/01/2015  . Hx pulmonary embolism 12/01/2015  . Palliative care encounter   . Pain in the chest   . Weakness   . Acute on chronic renal failure (HCC) 11/21/2015  . Acute respiratory failure with hypoxia (HCC) 11/21/2015  . CKD (chronic kidney disease) stage 3, GFR 30-59 ml/min 11/18/2015  . E. coli UTI (urinary tract infection) 08/28/2015  . GERD (gastroesophageal reflux disease) 08/28/2015  . Klebsiella cystitis 08/24/2015  . ARF (acute renal failure) (HCC) 08/24/2015  . Cough 09/24/2014  . Pain in joint, ankle and foot 04/15/2014  . Cellulitis 04/10/2014  . Pure hyperglyceridemia 12/22/2013  . Renal insufficiency 11/06/2013  . Chest pain 10/26/2013  . Epigastric pain 10/26/2013  . OA (osteoarthritis) 10/03/2013  . Edema 03/03/2013  . Essential hypertension, benign 01/05/2013  . Anemia 01/05/2013  . Glaucoma 06/06/2012  . CAD (coronary artery disease) 06/05/2012    CBC    Component Value Date/Time   WBC 8.5 03/05/2016   WBC 4.5 11/24/2015 0353   RBC 3.84 (L) 11/24/2015 0353   HGB 10.3 (A) 03/05/2016   HCT 30 (A) 03/05/2016   PLT 247 03/05/2016   MCV 95.6 11/24/2015 0353   LYMPHSABS 1.9 11/21/2015 1847    MONOABS 1.2 (H) 11/21/2015 1847   EOSABS 0.1 11/21/2015 1847   BASOSABS 0.0 11/21/2015 1847    CMP     Component Value Date/Time   NA 138 03/18/2016   K 4.6 03/18/2016   CL 102 11/24/2015 0353   CO2 23 11/24/2015 0353   GLUCOSE 79 11/24/2015 0353   BUN 36 (A) 03/18/2016   CREATININE 1.8 (A) 03/18/2016   CREATININE 1.31 (H) 11/24/2015 0353   CALCIUM 8.6 (L) 11/24/2015 0353   PROT 6.7 11/24/2015 0353   ALBUMIN 2.9 (L) 11/24/2015 0353   AST 18 03/05/2016   ALT 9 03/05/2016   ALKPHOS 76 03/05/2016   BILITOT 0.6 11/24/2015 0353   GFRNONAA 34 (L) 11/24/2015 0353   GFRAA 39 (L) 11/24/2015 0353    Assessment and Plan  GERD (gastroesophageal reflux disease) No recent problems; cont prilosec 40 mg  daily  CKD (chronic kidney disease) stage 3, GFR 30-59 ml/min BUN/Cr 36/1.8 which shows some decline from 2 months ago; not unexpected; will monitor at intervals  Glaucoma Chronic stable;cont xalatan and cosopt   Thurston Holenne D. Lyn HollingsheadAlexander, MD

## 2016-04-05 ENCOUNTER — Encounter: Payer: Self-pay | Admitting: Internal Medicine

## 2016-04-05 NOTE — Assessment & Plan Note (Signed)
BUN/Cr 36/1.8 which shows some decline from 2 months ago; not unexpected; will monitor at intervals

## 2016-04-05 NOTE — Assessment & Plan Note (Signed)
Chronic stable;cont xalatan and cosopt

## 2016-04-05 NOTE — Assessment & Plan Note (Signed)
No recent problems; cont prilosec 40 mg daily

## 2016-05-02 ENCOUNTER — Encounter: Payer: Self-pay | Admitting: Internal Medicine

## 2016-05-02 ENCOUNTER — Non-Acute Institutional Stay (SKILLED_NURSING_FACILITY): Payer: Medicare Other | Admitting: Internal Medicine

## 2016-05-02 DIAGNOSIS — I10 Essential (primary) hypertension: Secondary | ICD-10-CM | POA: Diagnosis not present

## 2016-05-02 DIAGNOSIS — I2510 Atherosclerotic heart disease of native coronary artery without angina pectoris: Secondary | ICD-10-CM

## 2016-05-02 DIAGNOSIS — E034 Atrophy of thyroid (acquired): Secondary | ICD-10-CM | POA: Diagnosis not present

## 2016-05-02 DIAGNOSIS — E038 Other specified hypothyroidism: Secondary | ICD-10-CM | POA: Diagnosis not present

## 2016-05-02 NOTE — Progress Notes (Signed)
MRN: [id number] Name: [name] [name]  Sex: female Age: 82 y.o. DOB: 08/06/1919  PSC #:  [facility] [address] Level Of Care: SNF Provider: Randon GoldsmithAnne D. Grisel Blumenstock,MD Emergency Contacts: Extended Emergency Contact Information Primary Emergency Contact: Wicker,Theresa Address: [address]          [place], [place] [address] [place] Home Phone: [phone] Relation: Daughter Secondary Emergency Contact: Garner,Cathy Address: [phone]          [place] [phone] [place] Home Phone: [phone] Work Phone: [phone] Relation: Daughter  Code Status: DNR  Allergies: Codeine; Morphine and related; and Penicillins  Chief Complaint  Patient presents with  . Medical Management of Chronic Issues    Routine Visit    HPI: Patient is 82 y.o. female who is being seen for routine issues of CAD, HTN and hypothyroidism.  Past Medical History:  Diagnosis Date  . AKI (acute kidney injury) (HCC) 07/2015  . CAD (coronary artery disease)    a. 02/2001 PTCA & BMS to the OM1;  b. 08/2001 CBA to ISR in OM1, CBA of mid LAD;  c. 02/2002 Failed PCI of mLAD occlusion, LCX nl, patent OM1 stent, RCA dom, 40p, PDA/PL nl, EF 50% mild antlat HK.  . Cataract    both eyes  . Glaucoma   . Hypertension   . Hypertriglyceridemia   . Osteoarthritis   . Poor circulation   . Thyroid disease    hypothyroidism, hypothyroidism  . UTI (urinary tract infection) 08/24/2015    Past Surgical History:  Procedure Laterality Date  . ABDOMINAL HYSTERECTOMY  1972  . CARDIAC SURGERY  2001   stent placement  . EYE SURGERY  2007/2008   cataracts  . GALLBLADDER SURGERY  2009  . HERNIA REPAIR  2009  . HIP ARTHROPLASTY  06/06/2012   Procedure: ARTHROPLASTY BIPOLAR HIP;  Surgeon: Eldred MangesMark C Yates, MD;  Location: [facility];  Service: Orthopedics;  Laterality: Right;  monopolar      Medication List       Accurate as of 05/02/16 11:59 PM. Always use your most recent med list.           acetaminophen 500 MG tablet Commonly known as:  TYLENOL Take 500 mg by mouth every 8 (eight) hours as needed.   aspirin 81 MG chewable tablet Chew 81 mg by mouth daily.   clopidogrel 75 MG tablet Commonly known as:  PLAVIX Take 75 mg by mouth daily.   Dorzolamide HCl-Timolol Mal PF 22.3-6.8 MG/ML Soln Place 1 drop into both eyes every 12 (twelve) hours.   ICAPS Tabs Take 1 tablet by mouth daily.   latanoprost 0.005 % ophthalmic solution Commonly known as:  XALATAN Place 1 drop into both eyes at bedtime.   metoprolol tartrate 25 MG tablet Commonly known as:  LOPRESSOR Take 25 mg by mouth 2 (two) times daily.   omeprazole 40 MG capsule Commonly known as:  PRILOSEC Take 40 mg by mouth daily.   ondansetron 4 MG disintegrating tablet Commonly known as:  ZOFRAN ODT Take 1 tablet (4 mg total) by mouth every 8 (eight) hours as needed for nausea or vomiting.   traMADol 50 MG tablet Commonly known as:  ULTRAM Give 1/2 tablet ( 25 mg )by mouth daily PRN neck pain       No orders of the defined types were placed in this encounter.   Immunization History  Administered Date(s) Administered  . Influenza Whole 05/30/2013  . Influenza-Unspecified 06/23/2014, 05/21/2015  .  PPD Test 10/26/2013  . Pneumococcal-Unspecified 08/23/2013    Social History  Substance Use Topics  . Smoking status: Never Smoker  . Smokeless tobacco: Never Used  . Alcohol use No    Review of Systems  DATA OBTAINED: from patient, nurse GENERAL:  no fevers, fatigue, appetite changes SKIN: No itching, rash HEENT: No complaint RESPIRATORY: No cough, wheezing, SOB CARDIAC: No chest pain, palpitations, lower extremity edema  GI: No abdominal pain, No N/V/D or constipation, No heartburn or reflux  GU: No dysuria, frequency or urgency, or incontinence  MUSCULOSKELETAL: No unrelieved bone/joint pain NEUROLOGIC: No headache, dizziness  PSYCHIATRIC: No overt anxiety or sadness  Vitals:    05/02/16 0939  BP: 128/67  Pulse: 74  Resp: 16  Temp: 98.4 F (36.9 C)    Physical Exam  GENERAL APPEARANCE: Alert, conversant, No acute distress  SKIN: No diaphoresis rash HEENT: Unremarkable RESPIRATORY: Breathing is even, unlabored. Lung sounds are clear   CARDIOVASCULAR: Heart RRR no murmurs, rubs or gallops. No peripheral edema  GASTROINTESTINAL: Abdomen is soft, non-tender, not distended w/ normal bowel sounds.  GENITOURINARY: Bladder non tender, not distended  MUSCULOSKELETAL: No abnormal joints or musculature NEUROLOGIC: Cranial nerves 2-12 grossly intact. Moves all extremities PSYCHIATRIC: Mood and affect appropriate to situation with dementia, no behavioral issues  Patient Active Problem List   Diagnosis Date Noted  . Hypothyroidism 05/04/2016  . Klebsiella pneumoniae sepsis (HCC) 12/01/2015  . Hx pulmonary embolism 12/01/2015  . Palliative care encounter   . Pain in the chest   . Weakness   . Acute on chronic renal failure (HCC) 11/21/2015  . Acute respiratory failure with hypoxia (HCC) 11/21/2015  . CKD (chronic kidney disease) stage 3, GFR 30-59 ml/min 11/18/2015  . E. coli UTI (urinary tract infection) 08/28/2015  . GERD (gastroesophageal reflux disease) 08/28/2015  . Klebsiella cystitis 08/24/2015  . ARF (acute renal failure) (HCC) 08/24/2015  . Cough 09/24/2014  . Pain in joint, ankle and foot 04/15/2014  . Cellulitis 04/10/2014  . Pure hyperglyceridemia 12/22/2013  . Renal insufficiency 11/06/2013  . Chest pain 10/26/2013  . Epigastric pain 10/26/2013  . OA (osteoarthritis) 10/03/2013  . Edema 03/03/2013  . Essential hypertension, benign 01/05/2013  . Anemia 01/05/2013  . Glaucoma 06/06/2012  . CAD (coronary artery disease) 06/05/2012    CBC    Component Value Date/Time   WBC 7.5 03/12/2016   WBC 4.5 11/24/2015 0353   RBC 3.84 (L) 11/24/2015 0353   HGB 10.5 (A) 03/12/2016   HCT 29 (A) 03/12/2016   PLT 317 03/12/2016   MCV 95.6 11/24/2015  0353   LYMPHSABS 1.9 11/21/2015 1847   MONOABS 1.2 (H) 11/21/2015 1847   EOSABS 0.1 11/21/2015 1847   BASOSABS 0.0 11/21/2015 1847    CMP     Component Value Date/Time   NA 138 03/18/2016   K 4.6 03/18/2016   CL 102 11/24/2015 0353   CO2 23 11/24/2015 0353   GLUCOSE 79 11/24/2015 0353   BUN 36 (A) 03/18/2016   CREATININE 1.8 (A) 03/18/2016   CREATININE 1.31 (H) 11/24/2015 0353   CALCIUM 8.6 (L) 11/24/2015 0353   PROT 6.7 11/24/2015 0353   ALBUMIN 2.9 (L) 11/24/2015 0353   AST 18 03/05/2016   ALT 9 03/05/2016   ALKPHOS 76 03/05/2016   BILITOT 0.6 11/24/2015 0353   GFRNONAA 34 (L) 11/24/2015 0353   GFRAA 39 (L) 11/24/2015 0353    Assessment and Plan  CAD (coronary artery disease) No reports of CP or equivalent;plan to  cont lopressor 25 mg BID and ASA;pt not on statin due to age  Essential hypertension, benign Contolled;plan to cont lopressor 25 mg BID  Hypothyroidism New dx;on routine lab TSH was 13..05; pt is started on synthroid 25 mcg daily with repeat TSH in 6 weeks   Thurston Hole D. [name], MD

## 2016-05-04 ENCOUNTER — Encounter: Payer: Self-pay | Admitting: Internal Medicine

## 2016-05-04 DIAGNOSIS — E039 Hypothyroidism, unspecified: Secondary | ICD-10-CM | POA: Insufficient documentation

## 2016-05-04 NOTE — Assessment & Plan Note (Signed)
No reports of CP or equivalent;plan to cont lopressor 25 mg BID and ASA;pt not on statin due to age

## 2016-05-04 NOTE — Assessment & Plan Note (Signed)
Contolled;plan to cont lopressor 25 mg BID

## 2016-05-04 NOTE — Assessment & Plan Note (Signed)
New dx;on routine lab TSH was 13..05; pt is started on synthroid 25 mcg daily with repeat TSH in 6 weeks

## 2016-05-07 LAB — BASIC METABOLIC PANEL
BUN: 31 mg/dL — AB (ref 4–21)
CREATININE: 1.5 mg/dL — AB (ref 0.5–1.1)
GLUCOSE: 85 mg/dL
POTASSIUM: 4.8 mmol/L (ref 3.4–5.3)
Sodium: 140 mmol/L (ref 137–147)

## 2016-05-07 LAB — HEPATIC FUNCTION PANEL
ALT: 10 U/L (ref 7–35)
AST: 21 U/L (ref 13–35)
Alkaline Phosphatase: 78 U/L (ref 25–125)
BILIRUBIN, TOTAL: 0.2 mg/dL

## 2016-05-07 LAB — CBC AND DIFFERENTIAL
HCT: 34 % — AB (ref 36–46)
HEMOGLOBIN: 11.1 g/dL — AB (ref 12.0–16.0)
Platelets: 272 10*3/uL (ref 150–399)
WBC: 7.9 10*3/mL

## 2016-05-07 LAB — LIPID PANEL
Cholesterol: 192 mg/dL (ref 0–200)
HDL: 37 mg/dL (ref 35–70)
LDL Cholesterol: 78 mg/dL
Triglycerides: 383 mg/dL — AB (ref 40–160)

## 2016-05-07 LAB — HEMOGLOBIN A1C: Hemoglobin A1C: 5.4

## 2016-05-29 ENCOUNTER — Encounter: Payer: Self-pay | Admitting: Internal Medicine

## 2016-05-29 ENCOUNTER — Non-Acute Institutional Stay (SKILLED_NURSING_FACILITY): Payer: Medicare Other | Admitting: Internal Medicine

## 2016-05-29 DIAGNOSIS — E785 Hyperlipidemia, unspecified: Secondary | ICD-10-CM | POA: Diagnosis not present

## 2016-05-29 DIAGNOSIS — Z86711 Personal history of pulmonary embolism: Secondary | ICD-10-CM

## 2016-05-29 DIAGNOSIS — D508 Other iron deficiency anemias: Secondary | ICD-10-CM

## 2016-05-29 NOTE — Progress Notes (Signed)
[facility] Room Number: [phone] Place of Service:  SNF [phone])  Randon Goldsmithnne D. Lyn HollingsheadAlexander, MD  Patient Care Team: Kaleen MaskWilson Oliver Elkins, MD as PCP - [facility](Family Medicine)  Extended Emergency Contact Information Primary Emergency Contact: Hall BusingWicker,Theresa Address: [address]          [place], [place] [id number] [place] Home Phone: [phone] Relation: Daughter Secondary Emergency Contact: Garner,Cathy Address: [phone]          [place] [id number] [place] Home Phone: [phone] Work Phone: [phone] Relation: Daughter    Allergies: Codeine; Morphine and related; and Penicillins  Chief Complaint  Patient presents with  . Medical Management of Chronic Issues    Routine Visit    HPI: Patient is 82 y.o. female who is being seen for routine issues of HLD, anemia and h/o PE.  Past Medical History:  Diagnosis Date  . AKI (acute kidney injury) (HCC) 07/2015  . CAD (coronary artery disease)    a. 02/2001 PTCA & BMS to the OM1;  b. 08/2001 CBA to ISR in OM1, CBA of mid LAD;  c. 02/2002 Failed PCI of mLAD occlusion, LCX nl, patent OM1 stent, RCA dom, 40p, PDA/PL nl, EF 50% mild antlat HK.  . Cataract    both eyes  . Glaucoma   . Hypertension   . Hypertriglyceridemia   . Osteoarthritis   . Poor circulation   . Thyroid disease    hypothyroidism, hypothyroidism  . UTI (urinary tract infection) 08/24/2015    Past Surgical History:  Procedure Laterality Date  . ABDOMINAL HYSTERECTOMY  1972  . CARDIAC SURGERY  2001   stent placement  . EYE SURGERY  2007/2008   cataracts  . GALLBLADDER SURGERY  2009  . HERNIA REPAIR  2009  . HIP ARTHROPLASTY  06/06/2012   Procedure: ARTHROPLASTY BIPOLAR HIP;  Surgeon: Eldred MangesMark C Yates, MD;  Location: [facility];  Service: Orthopedics;  Laterality: Right;  monopolar      Medication List       Accurate as of 05/29/16 11:59 PM. Always use your most recent med list.          acetaminophen 500 MG tablet Commonly known as:  TYLENOL Take 500 mg by mouth every 8 (eight) hours.   aspirin 81 MG chewable tablet Chew 81 mg by mouth daily.   clopidogrel 75 MG tablet Commonly known as:  PLAVIX Take 75 mg by mouth daily.   Dorzolamide HCl-Timolol Mal PF 22.3-6.8 MG/ML Soln Place 1 drop into both eyes every 12 (twelve) hours.   ICAPS Tabs Take 1 tablet by mouth daily.   latanoprost 0.005 % ophthalmic solution Commonly known as:  XALATAN Place 1 drop into both eyes at bedtime.   levothyroxine 25 MCG tablet Commonly known as:  SYNTHROID, LEVOTHROID Take 25 mcg by mouth daily before breakfast.   metoprolol tartrate 25 MG tablet Commonly known as:  LOPRESSOR Take 25 mg by mouth 2 (two) times daily.   omeprazole 40 MG capsule Commonly known as:  PRILOSEC Take 40 mg by mouth daily.   ondansetron 4 MG disintegrating tablet Commonly known as:  ZOFRAN ODT Take 1 tablet (4 mg total) by mouth every 8 (eight) hours as needed for nausea or vomiting.   traMADol 50 MG tablet Commonly known as:  ULTRAM Take 25 mg by mouth at bedtime as needed. 1/2 tablet       Meds ordered this encounter  Medications  . levothyroxine (  SYNTHROID, LEVOTHROID) 25 MCG tablet    Sig: Take 25 mcg by mouth daily before breakfast.    Immunization History  Administered Date(s) Administered  . Influenza Whole 05/30/2013  . Influenza-Unspecified 06/23/2014, 05/21/2015, 05/24/2016  . PPD Test 10/26/2013  . Pneumococcal-Unspecified 08/23/2013    Social History  Substance Use Topics  . Smoking status: Never Smoker  . Smokeless tobacco: Never Used  . Alcohol use No    Review of Systems  DATA OBTAINED: from patient, nurse, medical record, family member GENERAL:  no fevers, fatigue, appetite changes SKIN: No itching, rash HEENT: No complaint RESPIRATORY: No cough, wheezing, SOB CARDIAC: No chest pain, palpitations, lower extremity edema  GI: No abdominal pain, No N/V/D or  constipation, No heartburn or reflux  GU: No dysuria, frequency or urgency, or incontinence  MUSCULOSKELETAL: No unrelieved bone/joint pain NEUROLOGIC: No headache, dizziness  PSYCHIATRIC: No overt anxiety or sadness  Vitals:   05/29/16 1028  BP: 128/67  Pulse: 66  Resp: 18  Temp: 98.4 F (36.9 C)   Body mass index is 30.82 kg/m. Physical Exam  GENERAL APPEARANCE: Alert, conversant, No acute distress  SKIN: No diaphoresis rash HEENT: Unremarkable RESPIRATORY: Breathing is even, unlabored. Lung sounds are clear   CARDIOVASCULAR: Heart RRR no murmurs, rubs or gallops. No peripheral edema  GASTROINTESTINAL: Abdomen is soft, non-tender, not distended w/ normal bowel sounds.  GENITOURINARY: Bladder non tender, not distended  MUSCULOSKELETAL: No abnormal joints or musculature NEUROLOGIC: Cranial nerves 2-12 grossly intact. Moves all extremities PSYCHIATRIC: Mood and affect appropriate to situation, no behavioral issues  Patient Active Problem List   Diagnosis Date Noted  . Hypothyroidism 05/04/2016  . Klebsiella pneumoniae sepsis (HCC) 12/01/2015  . Hx pulmonary embolism 12/01/2015  . Palliative care encounter   . Pain in the chest   . Weakness   . Acute on chronic renal failure (HCC) 11/21/2015  . Acute respiratory failure with hypoxia (HCC) 11/21/2015  . CKD (chronic kidney disease) stage 3, GFR 30-59 ml/min 11/18/2015  . E. coli UTI (urinary tract infection) 08/28/2015  . GERD (gastroesophageal reflux disease) 08/28/2015  . Klebsiella cystitis 08/24/2015  . ARF (acute renal failure) (HCC) 08/24/2015  . Cough 09/24/2014  . Pain in joint, ankle and foot 04/15/2014  . Cellulitis 04/10/2014  . HLD (hyperlipidemia) 12/22/2013  . Renal insufficiency 11/06/2013  . Chest pain 10/26/2013  . Epigastric pain 10/26/2013  . OA (osteoarthritis) 10/03/2013  . Edema 03/03/2013  . Essential hypertension, benign 01/05/2013  . Anemia 01/05/2013  . Glaucoma 06/06/2012  . CAD  (coronary artery disease) 06/05/2012    CMP     Component Value Date/Time   NA 140 05/07/2016   K 4.8 05/07/2016   CL 102 11/24/2015 0353   CO2 23 11/24/2015 0353   GLUCOSE 79 11/24/2015 0353   BUN 31 (A) 05/07/2016   CREATININE 1.5 (A) 05/07/2016   CREATININE 1.31 (H) 11/24/2015 0353   CALCIUM 8.6 (L) 11/24/2015 0353   PROT 6.7 11/24/2015 0353   ALBUMIN 2.9 (L) 11/24/2015 0353   AST 21 05/07/2016   ALT 10 05/07/2016   ALKPHOS 78 05/07/2016   BILITOT 0.6 11/24/2015 0353   GFRNONAA 34 (L) 11/24/2015 0353   GFRAA 39 (L) 11/24/2015 0353    Recent Labs  11/21/15 1847 11/23/15 0450 11/24/15 0353  03/12/16 03/18/16 05/07/16  NA 136 136 136  < > 138 138 140  K 4.7 4.3 3.9  < > 4.5 4.6 4.8  CL 98* 103 102  --   --   --   --  CO2 26 26 23   --   --   --   --   GLUCOSE 125* 90 79  --   --   --   --   BUN 32* 31* 20  < > 34* 36* 31*  CREATININE 2.02* 1.77* 1.31*  < > 2.1* 1.8* 1.5*  CALCIUM 9.1 8.6* 8.6*  --   --   --   --   < > = values in this interval not displayed.  Recent Labs  08/24/15 0938 11/24/15 0353 03/05/16 05/07/16  AST 27 48* 18 21  ALT 18 22 9 10   ALKPHOS 86 66 76 78  BILITOT 0.5 0.6  --   --   PROT 7.9 6.7  --   --   ALBUMIN 3.3* 2.9*  --   --     Recent Labs  08/24/15 0938  11/21/15 1847 11/23/15 0450 11/24/15 0353  03/05/16 03/12/16 05/07/16  WBC 11.2*  < > 10.4 5.3 4.5  < > 8.5 7.5 7.9  NEUTROABS 9.3*  --  7.2  --   --   --   --   --   --   HGB 12.3  < > 12.1 10.7* 11.4*  < > 10.3* 10.5* 11.1*  HCT 38.6  < > 39.0 35.2* 36.7  < > 30* 29* 34*  MCV 95.8  < > 97.7 97.5 95.6  --   --   --   --   PLT 253  < > 218 195 199  < > 247 317 272  < > = values in this interval not displayed.  Recent Labs  12/07/15 05/07/16  CHOL 153 192  LDLCALC 81 78  TRIG 210* 383*   No results found for: [facility] Lab Results  Component Value Date   TSH 9.31 (A) 06/13/2016   Lab Results  Component Value Date   HGBA1C 5.4 05/07/2016   Lab Results    Component Value Date   CHOL 192 05/07/2016   HDL 37 05/07/2016   LDLCALC 78 05/07/2016   TRIG 383 (A) 05/07/2016   CHOLHDL 5.2 10/27/2013    Significant Diagnostic Results in last 30 days:  No results found.  Assessment and Plan  HLD (hyperlipidemia) Most recent lipid profile - HDL 37, LDL, 78, TG 383, odd medications now, this is reasonable 2/2 to pt's age; plan to cont no meds, no need to monitor any more  Anemia Hb in 04/2016 was 11.1 which is stable on no meds;plan to monitor at intervals   Hx pulmonary embolism Has been on xarelto for 6 months for presumed PE;plan to discuss with family stopping xarelto      Thurston Hole D. [name], MD

## 2016-06-13 LAB — TSH: TSH: 9.31 u[IU]/mL — AB (ref 0.41–5.90)

## 2016-06-14 ENCOUNTER — Encounter: Payer: Self-pay | Admitting: Internal Medicine

## 2016-06-14 NOTE — Assessment & Plan Note (Signed)
Most recent lipid profile - HDL 37, LDL, 78, TG 383, odd medications now, this is reasonable 2/2 to pt's age; plan to cont no meds, no need to monitor any more

## 2016-06-14 NOTE — Assessment & Plan Note (Signed)
Has been on xarelto for 6 months for presumed PE;plan to discuss with family stopping xarelto

## 2016-06-14 NOTE — Assessment & Plan Note (Signed)
Hb in 04/2016 was 11.1 which is stable on no meds;plan to monitor at intervals

## 2016-06-16 ENCOUNTER — Encounter: Payer: Self-pay | Admitting: Internal Medicine

## 2016-06-16 ENCOUNTER — Non-Acute Institutional Stay (SKILLED_NURSING_FACILITY): Payer: Medicare Other | Admitting: Internal Medicine

## 2016-06-16 DIAGNOSIS — B9689 Other specified bacterial agents as the cause of diseases classified elsewhere: Secondary | ICD-10-CM

## 2016-06-16 DIAGNOSIS — N3090 Cystitis, unspecified without hematuria: Secondary | ICD-10-CM

## 2016-06-16 DIAGNOSIS — B961 Klebsiella pneumoniae [K. pneumoniae] as the cause of diseases classified elsewhere: Secondary | ICD-10-CM

## 2016-06-16 NOTE — Progress Notes (Signed)
[facility] Room Number: [phone] Place of Service:  SNF [phone]  Randon Goldsmithnne D. Lyn HollingsheadAlexander, MD  Patient Care Team: Kaleen MaskWilson Oliver Elkins, MD as PCP - General [facility](Family Medicine)  Extended Emergency Contact Information Primary Emergency Contact: Hall BusingWicker,Theresa Address: [address]          [place], [place] [address] [place] Home Phone: [phone] Relation: Daughter Secondary Emergency Contact: Garner,Cathy Address: [phone]          [place] [id number] [place] Home Phone: [phone] Work Phone: [phone] Relation: Daughter    Allergies: Codeine; Morphine and related; and Penicillins  Chief Complaint  Patient presents with  . Acute Visit    Acute    HPI: Patient is 82 y.o. female who is being seen today for a UTI. Several days ago pt became incontinent and more confused so a urine was orderd. The MIC returned today. Urine grew out > 100,000 klebsiella pneumona. Pt is still with symptoms. Per nursing no fever.  Past Medical History:  Diagnosis Date  . AKI (acute kidney injury) (HCC) 07/2015  . CAD (coronary artery disease)    a. 02/2001 PTCA & BMS to the OM1;  b. 08/2001 CBA to ISR in OM1, CBA of mid LAD;  c. 02/2002 Failed PCI of mLAD occlusion, LCX nl, patent OM1 stent, RCA dom, 40p, PDA/PL nl, EF 50% mild antlat HK.  . Cataract    both eyes  . Glaucoma   . Hypertension   . Hypertriglyceridemia   . Osteoarthritis   . Poor circulation   . Thyroid disease    hypothyroidism, hypothyroidism  . UTI (urinary tract infection) 08/24/2015    Past Surgical History:  Procedure Laterality Date  . ABDOMINAL HYSTERECTOMY  1972  . CARDIAC SURGERY  2001   stent placement  . EYE SURGERY  2007/2008   cataracts  . GALLBLADDER SURGERY  2009  . HERNIA REPAIR  2009  . HIP ARTHROPLASTY  06/06/2012   Procedure: ARTHROPLASTY BIPOLAR HIP;  Surgeon: Eldred MangesMark C Yates, MD;  Location: [facility];  Service: Orthopedics;   Laterality: Right;  monopolar      Medication List       Accurate as of 06/16/16  9:25 AM. Always use your most recent med list.          acetaminophen 500 MG tablet Commonly known as:  TYLENOL Take 500 mg by mouth every 8 (eight) hours.   aspirin 81 MG chewable tablet Chew 81 mg by mouth daily.   clopidogrel 75 MG tablet Commonly known as:  PLAVIX Take 75 mg by mouth daily.   Dorzolamide HCl-Timolol Mal PF 22.3-6.8 MG/ML Soln Place 1 drop into both eyes every 12 (twelve) hours.   ICAPS Tabs Take 1 tablet by mouth daily.   latanoprost 0.005 % ophthalmic solution Commonly known as:  XALATAN Place 1 drop into both eyes at bedtime.   levothyroxine 50 MCG tablet Commonly known as:  SYNTHROID, LEVOTHROID Take 50 mcg by mouth daily before breakfast.   metoprolol tartrate 25 MG tablet Commonly known as:  LOPRESSOR Take 25 mg by mouth 2 (two) times daily.   omeprazole 40 MG capsule Commonly known as:  PRILOSEC Take 40 mg by mouth daily.   ondansetron 4 MG disintegrating tablet Commonly known as:  ZOFRAN ODT Take 1 tablet (4 mg total) by mouth every 8 (eight) hours as needed for nausea or vomiting.   traMADol 50 MG tablet Commonly known as:  ULTRAM Take 25 mg by mouth at bedtime as needed. 1/2 tablet       Meds ordered this encounter  Medications  . levothyroxine (SYNTHROID, LEVOTHROID) 50 MCG tablet    Sig: Take 50 mcg by mouth daily before breakfast.    Immunization History  Administered Date(s) Administered  . Influenza Whole 05/30/2013  . Influenza-Unspecified 06/23/2014, 05/21/2015, 05/24/2016  . PPD Test 10/26/2013  . Pneumococcal-Unspecified 08/23/2013    Social History  Substance Use Topics  . Smoking status: Never Smoker  . Smokeless tobacco: Never Used  . Alcohol use No    Review of Systems  DATA OBTAINED: from patient- pt can not fully participate; nurse- per HPI GENERAL:  no fevers, fatigue, appetite changes SKIN: No itching,  rash HEENT: No complaint RESPIRATORY: No cough, wheezing, SOB CARDIAC: No chest pain, palpitations, lower extremity edema  GI: No abdominal pain, No N/V/D or constipation, No heartburn or reflux  GU: No dysuria, frequency or urgency, + incontinence  MUSCULOSKELETAL: No unrelieved bone/joint pain NEUROLOGIC: No headache, dizziness  PSYCHIATRIC: MS change  Vitals:   06/16/16 0922  BP: (!) 127/58  Pulse: 69  Resp: 17  Temp: 98.1 F (36.7 C)   Body mass index is 30.82 kg/m. Physical Exam  GENERAL APPEARANCE: Alert, mod conversant, No acute distress  SKIN: No diaphoresis rash HEENT: Unremarkable RESPIRATORY: Breathing is even, unlabored. Lung sounds are clear   CARDIOVASCULAR: Heart RRR no murmurs, rubs or gallops. No peripheral edema  GASTROINTESTINAL: Abdomen is soft, non-tender, not distended w/ normal bowel sounds.  GENITOURINARY: Bladder non tender, not distended  MUSCULOSKELETAL: No abnormal joints or musculature NEUROLOGIC: Cranial nerves 2-12 grossly intact. Moves all extremities PSYCHIATRIC: More confusion than usual, no behavioral issues  Patient Active Problem List   Diagnosis Date Noted  . Hypothyroidism 05/04/2016  . Klebsiella pneumoniae sepsis (HCC) 12/01/2015  . Hx pulmonary embolism 12/01/2015  . Palliative care encounter   . Pain in the chest   . Weakness   . Acute on chronic renal failure (HCC) 11/21/2015  . Acute respiratory failure with hypoxia (HCC) 11/21/2015  . CKD (chronic kidney disease) stage 3, GFR 30-59 ml/min 11/18/2015  . E. coli UTI (urinary tract infection) 08/28/2015  . GERD (gastroesophageal reflux disease) 08/28/2015  . Klebsiella cystitis 08/24/2015  . ARF (acute renal failure) (HCC) 08/24/2015  . Cough 09/24/2014  . Pain in joint, ankle and foot 04/15/2014  . Cellulitis 04/10/2014  . HLD (hyperlipidemia) 12/22/2013  . Renal insufficiency 11/06/2013  . Chest pain 10/26/2013  . Epigastric pain 10/26/2013  . OA (osteoarthritis)  10/03/2013  . Edema 03/03/2013  . Essential hypertension, benign 01/05/2013  . Anemia 01/05/2013  . Glaucoma 06/06/2012  . CAD (coronary artery disease) 06/05/2012    CMP     Component Value Date/Time   NA 140 05/07/2016   K 4.8 05/07/2016   CL 102 11/24/2015 0353   CO2 23 11/24/2015 0353   GLUCOSE 79 11/24/2015 0353   BUN 31 (A) 05/07/2016   CREATININE 1.5 (A) 05/07/2016   CREATININE 1.31 (H) 11/24/2015 0353   CALCIUM 8.6 (L) 11/24/2015 0353   PROT 6.7 11/24/2015 0353   ALBUMIN 2.9 (L) 11/24/2015 0353   AST 21 05/07/2016   ALT 10 05/07/2016   ALKPHOS 78 05/07/2016   BILITOT 0.6 11/24/2015 0353   GFRNONAA 34 (L) 11/24/2015 0353   GFRAA 39 (L) 11/24/2015 0353    Recent Labs  11/21/15 1847 11/23/15 0450 11/24/15 0353  03/12/16 03/18/16 05/07/16  NA 136 136 136  < >  138 138 140  K 4.7 4.3 3.9  < > 4.5 4.6 4.8  CL 98* 103 102  --   --   --   --   CO2 26 26 23   --   --   --   --   GLUCOSE 125* 90 79  --   --   --   --   BUN 32* 31* 20  < > 34* 36* 31*  CREATININE 2.02* 1.77* 1.31*  < > 2.1* 1.8* 1.5*  CALCIUM 9.1 8.6* 8.6*  --   --   --   --   < > = values in this interval not displayed.  Recent Labs  08/24/15 0938 11/24/15 0353 03/05/16 05/07/16  AST 27 48* 18 21  ALT 18 22 9 10   ALKPHOS 86 66 76 78  BILITOT 0.5 0.6  --   --   PROT 7.9 6.7  --   --   ALBUMIN 3.3* 2.9*  --   --     Recent Labs  08/24/15 0938  11/21/15 1847 11/23/15 0450 11/24/15 0353  03/05/16 03/12/16 05/07/16  WBC 11.2*  < > 10.4 5.3 4.5  < > 8.5 7.5 7.9  NEUTROABS 9.3*  --  7.2  --   --   --   --   --   --   HGB 12.3  < > 12.1 10.7* 11.4*  < > 10.3* 10.5* 11.1*  HCT 38.6  < > 39.0 35.2* 36.7  < > 30* 29* 34*  MCV 95.8  < > 97.7 97.5 95.6  --   --   --   --   PLT 253  < > 218 195 199  < > 247 317 272  < > = values in this interval not displayed.  Recent Labs  12/07/15 05/07/16  CHOL 153 192  LDLCALC 81 78  TRIG 210* 383*   No results found for: [facility] Lab Results    Component Value Date   TSH 9.31 (A) 06/13/2016   Lab Results  Component Value Date   HGBA1C 5.4 05/07/2016   Lab Results  Component Value Date   CHOL 192 05/07/2016   HDL 37 05/07/2016   LDLCALC 78 05/07/2016   TRIG 383 (A) 05/07/2016   CHOLHDL 5.2 10/27/2013    Significant Diagnostic Results in last 30 days:  No results found.  Assessment and Plan  KLEBSIELLA UTI - pt is PCN allergic so will need to use levaquin; pt's Cr is 1.53 , CrCl is 23.1; dose of levaquin will be 750 mg q 48 for 4 doses; pt is still taking po's and drinking OK   Time spent > 35 min;> 50% of time with patient was spent reviewing records, labs, tests and studies, counseling and developing plan of care  Thurston Hole D. [name], MD

## 2016-06-28 ENCOUNTER — Encounter: Payer: Self-pay | Admitting: Internal Medicine

## 2016-06-30 ENCOUNTER — Non-Acute Institutional Stay (SKILLED_NURSING_FACILITY): Payer: Medicare Other | Admitting: Internal Medicine

## 2016-06-30 ENCOUNTER — Encounter: Payer: Self-pay | Admitting: Internal Medicine

## 2016-06-30 DIAGNOSIS — N183 Chronic kidney disease, stage 3 unspecified: Secondary | ICD-10-CM

## 2016-06-30 DIAGNOSIS — M159 Polyosteoarthritis, unspecified: Secondary | ICD-10-CM

## 2016-06-30 DIAGNOSIS — M150 Primary generalized (osteo)arthritis: Secondary | ICD-10-CM | POA: Diagnosis not present

## 2016-06-30 DIAGNOSIS — K219 Gastro-esophageal reflux disease without esophagitis: Secondary | ICD-10-CM

## 2016-06-30 NOTE — Progress Notes (Signed)
[facility] Room Number: [phone] Place of Service:  SNF [phone])  [name] [name]. [name], MD  Patient Care Team: Kaleen Mask, MD as PCP - General [facility])  Extended Emergency Contact Information Primary Emergency Contact: Hall Busing Address: [address]          [place], [place] [address] [place] Home Phone: [phone] Relation: Daughter Secondary Emergency Contact: Garner,Cathy Address: [phone]          [place] [address] [place] Home Phone: [phone] Work Phone: [phone] Relation: Daughter    Allergies: Codeine; Morphine and related; and Penicillins  Chief Complaint  Patient presents with  . Medical Management of Chronic Issues    Routine Visit    HPI: Patient is 82 y.o. female who is being seen for routine issues of GERD, OA, and CKD3.  Past Medical History:  Diagnosis Date  . AKI (acute kidney injury) (HCC) 07/2015  . CAD (coronary artery disease)    a. 02/2001 PTCA & BMS to the OM1;  b. 08/2001 CBA to ISR in OM1, CBA of mid LAD;  c. 02/2002 Failed PCI of mLAD occlusion, LCX nl, patent OM1 stent, RCA dom, 40p, PDA/PL nl, EF 50% mild antlat HK.  . Cataract    both eyes  . Glaucoma   . Hypertension   . Hypertriglyceridemia   . Osteoarthritis   . Poor circulation   . Thyroid disease    hypothyroidism, hypothyroidism  . UTI (urinary tract infection) 08/24/2015    Past Surgical History:  Procedure Laterality Date  . ABDOMINAL HYSTERECTOMY  1972  . CARDIAC SURGERY  2001   stent placement  . EYE SURGERY  2007/2008   cataracts  . GALLBLADDER SURGERY  2009  . HERNIA REPAIR  2009  . HIP ARTHROPLASTY  06/06/2012   Procedure: ARTHROPLASTY BIPOLAR HIP;  Surgeon: Eldred Manges, MD;  Location: [facility];  Service: Orthopedics;  Laterality: Right;  monopolar      Medication List       Accurate as of 06/30/16 11:59 PM. Always use your most recent med list.            acetaminophen 500 MG tablet Commonly known as:  TYLENOL Take 500 mg by mouth every 8 (eight) hours.   aspirin 81 MG chewable tablet Chew 81 mg by mouth daily.   clopidogrel 75 MG tablet Commonly known as:  PLAVIX Take 75 mg by mouth daily.   Dorzolamide HCl-Timolol Mal PF 22.3-6.8 MG/ML Soln Place 1 drop into both eyes every 12 (twelve) hours.   ICAPS Tabs Take 1 tablet by mouth daily.   latanoprost 0.005 % ophthalmic solution Commonly known as:  XALATAN Place 1 drop into both eyes at bedtime.   levothyroxine 50 MCG tablet Commonly known as:  SYNTHROID, LEVOTHROID Take 50 mcg by mouth daily before breakfast.   loperamide 2 MG tablet Commonly known as:  IMODIUM A-D Give 2 tablets by mouth (4 mg total) after first loose stool. May give 1 tablet by mouth (2 mg) after each loose stool not to exceed 8 mg by mouth in 24 hours.   metoprolol tartrate 25 MG tablet Commonly known as:  LOPRESSOR Take 25 mg by mouth 2 (two) times daily.   omeprazole 40 MG capsule Commonly known as:  PRILOSEC Take 40 mg by mouth daily.   ondansetron 4 MG disintegrating tablet Commonly known as:  ZOFRAN ODT Take 1 tablet (4 mg total) by mouth  every 8 (eight) hours as needed for nausea or vomiting.   traMADol 50 MG tablet Commonly known as:  ULTRAM Take 25 mg by mouth at bedtime as needed. 1/2 tablet       Meds ordered this encounter  Medications  . loperamide (IMODIUM A-D) 2 MG tablet    Sig: Give 2 tablets by mouth (4 mg total) after first loose stool. May give 1 tablet by mouth (2 mg) after each loose stool not to exceed 8 mg by mouth in 24 hours.    Immunization History  Administered Date(s) Administered  . Influenza Whole 05/30/2013  . Influenza-Unspecified 06/23/2014, 05/21/2015, 05/24/2016  . PPD Test 10/26/2013  . Pneumococcal-Unspecified 08/23/2013    Social History  Substance Use Topics  . Smoking status: Never Smoker  . Smokeless tobacco: Never Used  . Alcohol use No     Review of Systems  DATA OBTAINED: from patient, nurse GENERAL:  no fevers, fatigue, appetite changes SKIN: No itching, rash HEENT: No complaint RESPIRATORY: No cough, wheezing, SOB CARDIAC: No chest pain, palpitations, lower extremity edema  GI: No abdominal pain, No N/V/D or constipation, No heartburn or reflux  GU: No dysuria, frequency or urgency, or incontinence  MUSCULOSKELETAL: No unrelieved bone/joint pain NEUROLOGIC: No headache, dizziness  PSYCHIATRIC: No overt anxiety or sadness  Vitals:   06/30/16 1205  BP: 128/67  Pulse: 88  Resp: 18  Temp: 97.3 F (36.3 C)   Body mass index is 30.86 kg/m. Physical Exam  GENERAL APPEARANCE: Alert, mod, No acute distress  SKIN: No diaphoresis rash HEENT: Unremarkable RESPIRATORY: Breathing is even, unlabored. Lung sounds are clear   CARDIOVASCULAR: Heart RRR no murmurs, rubs or gallops. No peripheral edema  GASTROINTESTINAL: Abdomen is soft, non-tender, not distended w/ normal bowel sounds.  GENITOURINARY: Bladder non tender, not distended  MUSCULOSKELETAL: No abnormal joints or musculature NEUROLOGIC: Cranial nerves 2-12 grossly intact. Moves all extremities PSYCHIATRIC: Mood and affect appropriate to situation with dementia, no behavioral issues  Patient Active Problem List   Diagnosis Date Noted  . Hypothyroidism 05/04/2016  . Klebsiella pneumoniae sepsis (HCC) 12/01/2015  . Hx pulmonary embolism 12/01/2015  . Palliative care encounter   . Pain in the chest   . Weakness   . Acute on chronic renal failure (HCC) 11/21/2015  . Acute respiratory failure with hypoxia (HCC) 11/21/2015  . CKD (chronic kidney disease) stage 3, GFR 30-59 ml/min 11/18/2015  . E. coli UTI (urinary tract infection) 08/28/2015  . GERD (gastroesophageal reflux disease) 08/28/2015  . Klebsiella cystitis 08/24/2015  . ARF (acute renal failure) (HCC) 08/24/2015  . Cough 09/24/2014  . Pain in joint, ankle and foot 04/15/2014  . Cellulitis  04/10/2014  . HLD (hyperlipidemia) 12/22/2013  . Renal insufficiency 11/06/2013  . Chest pain 10/26/2013  . Epigastric pain 10/26/2013  . OA (osteoarthritis) 10/03/2013  . Edema 03/03/2013  . Essential hypertension, benign 01/05/2013  . Anemia 01/05/2013  . Glaucoma 06/06/2012  . CAD (coronary artery disease) 06/05/2012    CMP     Component Value Date/Time   NA 140 05/07/2016   K 4.8 05/07/2016   CL 102 11/24/2015 0353   CO2 23 11/24/2015 0353   GLUCOSE 79 11/24/2015 0353   BUN 31 (A) 05/07/2016   CREATININE 1.5 (A) 05/07/2016   CREATININE 1.31 (H) 11/24/2015 0353   CALCIUM 8.6 (L) 11/24/2015 0353   PROT 6.7 11/24/2015 0353   ALBUMIN 2.9 (L) 11/24/2015 0353   AST 21 05/07/2016   ALT 10 05/07/2016  ALKPHOS 78 05/07/2016   BILITOT 0.6 11/24/2015 0353   GFRNONAA 34 (L) 11/24/2015 0353   GFRAA 39 (L) 11/24/2015 0353    Recent Labs  11/21/15 1847 11/23/15 0450 11/24/15 0353  03/12/16 03/18/16 05/07/16  NA 136 136 136  < > 138 138 140  K 4.7 4.3 3.9  < > 4.5 4.6 4.8  CL 98* 103 102  --   --   --   --   CO2 26 26 23   --   --   --   --   GLUCOSE 125* 90 79  --   --   --   --   BUN 32* 31* 20  < > 34* 36* 31*  CREATININE 2.02* 1.77* 1.31*  < > 2.1* 1.8* 1.5*  CALCIUM 9.1 8.6* 8.6*  --   --   --   --   < > = values in this interval not displayed.  Recent Labs  08/24/15 0938 11/24/15 0353 03/05/16 05/07/16  AST 27 48* 18 21  ALT 18 22 9 10   ALKPHOS 86 66 76 78  BILITOT 0.5 0.6  --   --   PROT 7.9 6.7  --   --   ALBUMIN 3.3* 2.9*  --   --     Recent Labs  08/24/15 0938  11/21/15 1847 11/23/15 0450 11/24/15 0353  03/05/16 03/12/16 05/07/16  WBC 11.2*  < > 10.4 5.3 4.5  < > 8.5 7.5 7.9  NEUTROABS 9.3*  --  7.2  --   --   --   --   --   --   HGB 12.3  < > 12.1 10.7* 11.4*  < > 10.3* 10.5* 11.1*  HCT 38.6  < > 39.0 35.2* 36.7  < > 30* 29* 34*  MCV 95.8  < > 97.7 97.5 95.6  --   --   --   --   PLT 253  < > 218 195 199  < > 247 317 272  < > = values in this  interval not displayed.  Recent Labs  12/07/15 05/07/16  CHOL 153 192  LDLCALC 81 78  TRIG 210* 383*   [facility] found [facility]: [facility] Lab Results  Component Value Date   TSH 9.31 (A) 06/13/2016   Lab Results  Component Value Date   HGBA1C 5.4 05/07/2016   Lab Results  Component Value Date   CHOL 192 05/07/2016   HDL 37 05/07/2016   LDLCALC 78 05/07/2016   TRIG 383 (A) 05/07/2016   CHOLHDL 5.2 10/27/2013    Significant Diagnostic Results in last 30 days:  No results found.  Assessment and Plan  GERD (gastroesophageal reflux disease) No reports of reflux or heartburn;plan to cont omeprazole 40 mg daily  OA (osteoarthritis) No reports of uncontrolled pain ;plan to cont to use tylenol and tramadol prn for pain  CKD (chronic kidney disease) stage 3, GFR 30-59 ml/min BUN/Cr stable at 28/1.5; plan to monitor at intervals     Thurston HoleAnne D. Lyn HollingsheadAlexander, MD

## 2016-07-05 ENCOUNTER — Encounter: Payer: Self-pay | Admitting: Internal Medicine

## 2016-07-05 NOTE — Assessment & Plan Note (Signed)
No reports of reflux or heartburn;plan to cont omeprazole 40 mg daily

## 2016-07-05 NOTE — Assessment & Plan Note (Signed)
BUN/Cr stable at 28/1.5; plan to monitor at intervals

## 2016-07-05 NOTE — Assessment & Plan Note (Signed)
No reports of uncontrolled pain ;plan to cont to use tylenol and tramadol prn for pain

## 2016-07-30 ENCOUNTER — Non-Acute Institutional Stay (SKILLED_NURSING_FACILITY): Payer: Medicare Other | Admitting: Internal Medicine

## 2016-07-30 ENCOUNTER — Encounter: Payer: Self-pay | Admitting: Internal Medicine

## 2016-07-30 DIAGNOSIS — I10 Essential (primary) hypertension: Secondary | ICD-10-CM | POA: Diagnosis not present

## 2016-07-30 DIAGNOSIS — E034 Atrophy of thyroid (acquired): Secondary | ICD-10-CM | POA: Diagnosis not present

## 2016-07-30 DIAGNOSIS — I2510 Atherosclerotic heart disease of native coronary artery without angina pectoris: Secondary | ICD-10-CM

## 2016-07-30 NOTE — Progress Notes (Signed)
[facility] Room Number: [phone] Place of Service:  SNF [phone])  Randon Goldsmithnne D. Lyn HollingsheadAlexander, MD  Patient Care Team: Kaleen MaskWilson Oliver Elkins, MD as PCP - [facility] [place](Family Medicine)  Extended Emergency Contact Information Primary Emergency Contact: Hall BusingWicker,Theresa Address: [address]          [place], [place] [id number] [place] Home Phone: [phone] Relation: Daughter Secondary Emergency Contact: Garner,Cathy Address: [id number] [facility] [address]          [place] [id number] [place] Home Phone: [phone] Work Phone: [phone] Relation: Daughter    Allergies: Codeine; Morphine and related; and Penicillins  Chief Complaint  Patient presents with  . Medical Management of Chronic Issues    Routine Visit    HPI: Patient is 82 y.o. female who is being seen for routine issues of CAD, HTN and hypothyroidism.  Past Medical History:  Diagnosis Date  . AKI (acute kidney injury) (HCC) 07/2015  . CAD (coronary artery disease)    a. 02/2001 PTCA & BMS to the OM1;  b. 08/2001 CBA to ISR in OM1, CBA of mid LAD;  c. 02/2002 Failed PCI of mLAD occlusion, LCX nl, patent OM1 stent, RCA dom, 40p, PDA/PL nl, EF 50% mild antlat HK.  . Cataract    both eyes  . Glaucoma   . Hypertension   . Hypertriglyceridemia   . Osteoarthritis   . Poor circulation   . Thyroid disease    hypothyroidism, hypothyroidism  . UTI (urinary tract infection) 08/24/2015    Past Surgical History:  Procedure Laterality Date  . ABDOMINAL HYSTERECTOMY  1972  . CARDIAC SURGERY  2001   stent placement  . EYE SURGERY  2007/2008   cataracts  . GALLBLADDER SURGERY  2009  . HERNIA REPAIR  2009  . HIP ARTHROPLASTY  06/06/2012   Procedure: ARTHROPLASTY BIPOLAR HIP;  Surgeon: Eldred MangesMark C Yates, MD;  Location: [facility];  Service: Orthopedics;  Laterality: Right;  monopolar    Allergies as of 07/30/2016      Reactions   Codeine Nausea And Vomiting   Morphine And Related Nausea  And Vomiting   Penicillins Hives      Medication List       Accurate as of 07/30/16 11:59 PM. Always use your most recent med list.          acetaminophen 500 MG tablet Commonly known as:  TYLENOL Take 500 mg by mouth every 8 (eight) hours.   aspirin 81 MG chewable tablet Chew 81 mg by mouth daily.   clopidogrel 75 MG tablet Commonly known as:  PLAVIX Take 75 mg by mouth daily.   Dorzolamide HCl-Timolol Mal PF 22.3-6.8 MG/ML Soln Place 1 drop into both eyes every 12 (twelve) hours.   latanoprost 0.005 % ophthalmic solution Commonly known as:  XALATAN Place 1 drop into both eyes at bedtime.   levothyroxine 50 MCG tablet Commonly known as:  SYNTHROID, LEVOTHROID Take 50 mcg by mouth daily before breakfast.   loperamide 2 MG tablet Commonly known as:  IMODIUM A-D Give 2 tablets by mouth (4 mg total) after first loose stool. May give 1 tablet by mouth (2 mg) after each loose stool not to exceed 8 mg by mouth in 24 hours.   metoprolol tartrate 25 MG tablet Commonly known as:  LOPRESSOR Take 25 mg by mouth 2 (two) times daily.   multivitamin with minerals tablet Take 1 tablet by mouth daily.   omeprazole 40 MG capsule  Commonly known as:  PRILOSEC Take 40 mg by mouth daily.   ondansetron 4 MG disintegrating tablet Commonly known as:  ZOFRAN ODT Take 1 tablet (4 mg total) by mouth every 8 (eight) hours as needed for nausea or vomiting.   traMADol 50 MG tablet Commonly known as:  ULTRAM Take 25 mg by mouth at bedtime as needed. 1/2 tablet       No orders of the defined types were placed in this encounter.   Immunization History  Administered Date(s) Administered  . Influenza Whole 05/30/2013  . Influenza-Unspecified 06/23/2014, 05/21/2015, 05/24/2016  . PPD Test 10/26/2013  . Pneumococcal-Unspecified 08/23/2013    Social History  Substance Use Topics  . Smoking status: Never Smoker  . Smokeless tobacco: Never Used  . Alcohol use No    Review of  Systems  DATA OBTAINED: from patient- pt can not fully participate nurse GENERAL:  no fevers, fatigue, appetite changes SKIN: No itching, rash HEENT: No complaint RESPIRATORY: No cough, wheezing, SOB CARDIAC: No chest pain, palpitations, lower extremity edema  GI: No abdominal pain, No N/V/D or constipation, No heartburn or reflux  GU: No dysuria, frequency or urgency, or incontinence  MUSCULOSKELETAL: No unrelieved bone/joint pain NEUROLOGIC: No headache, dizziness  PSYCHIATRIC: No overt anxiety or sadness  Vitals:   07/30/16 1008  BP: 128/67  Pulse: 72  Resp: 16  Temp: (!) 96.4 F (35.8 C)   Body mass index is 30.11 kg/m. Physical Exam  GENERAL APPEARANCE: Alert, mod conversant, No acute distress  SKIN: No diaphoresis rash HEENT: Unremarkable RESPIRATORY: Breathing is even, unlabored. Lung sounds are clear   CARDIOVASCULAR: Heart RRR no murmurs, rubs or gallops. No peripheral edema  GASTROINTESTINAL: Abdomen is soft, non-tender, not distended w/ normal bowel sounds.  GENITOURINARY: Bladder non tender, not distended  MUSCULOSKELETAL: No abnormal joints or musculature NEUROLOGIC: Cranial nerves 2-12 grossly intact. Moves all extremities PSYCHIATRIC: Mood and affect appropriate to situation with dementia, no behavioral issues  Patient Active Problem List   Diagnosis Date Noted  . Hypothyroidism 05/04/2016  . Klebsiella pneumoniae sepsis (HCC) 12/01/2015  . Hx pulmonary embolism 12/01/2015  . Palliative care encounter   . Pain in the chest   . Weakness   . Acute on chronic renal failure (HCC) 11/21/2015  . Acute respiratory failure with hypoxia (HCC) 11/21/2015  . CKD (chronic kidney disease) stage 3, GFR 30-59 ml/min 11/18/2015  . E. coli UTI (urinary tract infection) 08/28/2015  . GERD (gastroesophageal reflux disease) 08/28/2015  . Klebsiella cystitis 08/24/2015  . ARF (acute renal failure) (HCC) 08/24/2015  . Cough 09/24/2014  . Pain in joint, ankle and foot  04/15/2014  . Cellulitis 04/10/2014  . HLD (hyperlipidemia) 12/22/2013  . Renal insufficiency 11/06/2013  . Chest pain 10/26/2013  . Epigastric pain 10/26/2013  . OA (osteoarthritis) 10/03/2013  . Edema 03/03/2013  . Essential hypertension, benign 01/05/2013  . Anemia 01/05/2013  . Glaucoma 06/06/2012  . CAD (coronary artery disease) 06/05/2012    CMP     Component Value Date/Time   NA 140 05/07/2016   K 4.8 05/07/2016   CL 102 11/24/2015 0353   CO2 23 11/24/2015 0353   GLUCOSE 79 11/24/2015 0353   BUN 31 (A) 05/07/2016   CREATININE 1.5 (A) 05/07/2016   CREATININE 1.31 (H) 11/24/2015 0353   CALCIUM 8.6 (L) 11/24/2015 0353   PROT 6.7 11/24/2015 0353   ALBUMIN 2.9 (L) 11/24/2015 0353   AST 21 05/07/2016   ALT 10 05/07/2016   ALKPHOS 78 05/07/2016  BILITOT 0.6 11/24/2015 0353   GFRNONAA 34 (L) 11/24/2015 0353   GFRAA 39 (L) 11/24/2015 0353    Recent Labs  11/21/15 1847 11/23/15 0450 11/24/15 0353  03/12/16 03/18/16 05/07/16  NA 136 136 136  < > 138 138 140  K 4.7 4.3 3.9  < > 4.5 4.6 4.8  CL 98* 103 102  --   --   --   --   CO2 26 26 23   --   --   --   --   GLUCOSE 125* 90 79  --   --   --   --   BUN 32* 31* 20  < > 34* 36* 31*  CREATININE 2.02* 1.77* 1.31*  < > 2.1* 1.8* 1.5*  CALCIUM 9.1 8.6* 8.6*  --   --   --   --   < > = values in this interval not displayed.  Recent Labs  11/24/15 0353 03/05/16 05/07/16  AST 48* 18 21  ALT 22 9 10   ALKPHOS 66 76 78  BILITOT 0.6  --   --   PROT 6.7  --   --   ALBUMIN 2.9*  --   --     Recent Labs  11/21/15 1847 11/23/15 0450 11/24/15 0353  03/05/16 03/12/16 05/07/16  WBC 10.4 5.3 4.5  < > 8.5 7.5 7.9  NEUTROABS 7.2  --   --   --   --   --   --   HGB 12.1 10.7* 11.4*  < > 10.3* 10.5* 11.1*  HCT 39.0 35.2* 36.7  < > 30* 29* 34*  MCV 97.7 97.5 95.6  --   --   --   --   PLT 218 195 199  < > 247 317 272  < > = values in this interval not displayed.  Recent Labs  12/07/15 05/07/16  CHOL 153 192  LDLCALC 81 78   TRIG 210* 383*   No results found for: [facility] Lab Results  Component Value Date   TSH 9.31 (A) 06/13/2016   Lab Results  Component Value Date   HGBA1C 5.4 05/07/2016   Lab Results  Component Value Date   CHOL 192 05/07/2016   HDL 37 05/07/2016   LDLCALC 78 05/07/2016   TRIG 383 (A) 05/07/2016   CHOLHDL 5.2 10/27/2013    Significant Diagnostic Results in last 30 days:  No results found.  Assessment and Plan  CAD (coronary artery disease) No reports of CP or equivalent;plan to cont lopressor 25 mg BID and ASA 81 mg daily; continues on no statin due to age  Essential hypertension, benign Controlled on lopressor 25 mg BID; plan to cont current med  Hypothyroidism Recent TSh 9.31; have increased synthroid to 50 mcg daily, up from 25; repeat TSH 4-6 weeks   Time spent . 25 min;> 50% of time with patient was spent reviewing records, labs, tests and studies, counseling and developing plan of care  Randon Goldsmith. [name], MD

## 2016-08-24 ENCOUNTER — Encounter: Payer: Self-pay | Admitting: Internal Medicine

## 2016-08-24 NOTE — Assessment & Plan Note (Signed)
Controlled on lopressor 25 mg BID; plan to cont current med

## 2016-08-24 NOTE — Assessment & Plan Note (Signed)
Recent TSh 9.31; have increased synthroid to 50 mcg daily, up from 25; repeat TSH 4-6 weeks

## 2016-08-24 NOTE — Assessment & Plan Note (Signed)
No reports of CP or equivalent;plan to cont lopressor 25 mg BID and ASA 81 mg daily; continues on no statin due to age

## 2016-09-09 ENCOUNTER — Emergency Department (HOSPITAL_COMMUNITY): Payer: Medicare Other

## 2016-09-09 ENCOUNTER — Emergency Department (HOSPITAL_COMMUNITY)
Admission: EM | Admit: 2016-09-09 | Discharge: 2016-09-09 | Disposition: A | Discharge status: Home / Self Care | Payer: Medicare Other | Attending: Emergency Medicine | Admitting: Emergency Medicine

## 2016-09-09 ENCOUNTER — Encounter (HOSPITAL_COMMUNITY): Payer: Self-pay | Admitting: Emergency Medicine

## 2016-09-09 ENCOUNTER — Encounter: Payer: Self-pay | Admitting: Internal Medicine

## 2016-09-09 DIAGNOSIS — Z7982 Long term (current) use of aspirin: Secondary | ICD-10-CM | POA: Diagnosis not present

## 2016-09-09 DIAGNOSIS — Y929 Unspecified place or not applicable: Secondary | ICD-10-CM | POA: Diagnosis not present

## 2016-09-09 DIAGNOSIS — N183 Chronic kidney disease, stage 3 (moderate): Secondary | ICD-10-CM | POA: Insufficient documentation

## 2016-09-09 DIAGNOSIS — Z96641 Presence of right artificial hip joint: Secondary | ICD-10-CM | POA: Insufficient documentation

## 2016-09-09 DIAGNOSIS — S4991XA Unspecified injury of right shoulder and upper arm, initial encounter: Secondary | ICD-10-CM | POA: Diagnosis present

## 2016-09-09 DIAGNOSIS — Z23 Encounter for immunization: Secondary | ICD-10-CM | POA: Insufficient documentation

## 2016-09-09 DIAGNOSIS — S41112A Laceration without foreign body of left upper arm, initial encounter: Secondary | ICD-10-CM | POA: Diagnosis not present

## 2016-09-09 DIAGNOSIS — S53401A Unspecified sprain of right elbow, initial encounter: Secondary | ICD-10-CM

## 2016-09-09 DIAGNOSIS — W1789XA Other fall from one level to another, initial encounter: Secondary | ICD-10-CM | POA: Diagnosis not present

## 2016-09-09 DIAGNOSIS — I2510 Atherosclerotic heart disease of native coronary artery without angina pectoris: Secondary | ICD-10-CM | POA: Diagnosis not present

## 2016-09-09 DIAGNOSIS — S93601A Unspecified sprain of right foot, initial encounter: Secondary | ICD-10-CM

## 2016-09-09 DIAGNOSIS — Z79899 Other long term (current) drug therapy: Secondary | ICD-10-CM | POA: Insufficient documentation

## 2016-09-09 DIAGNOSIS — I129 Hypertensive chronic kidney disease with stage 1 through stage 4 chronic kidney disease, or unspecified chronic kidney disease: Secondary | ICD-10-CM | POA: Insufficient documentation

## 2016-09-09 DIAGNOSIS — Y999 Unspecified external cause status: Secondary | ICD-10-CM | POA: Insufficient documentation

## 2016-09-09 DIAGNOSIS — E039 Hypothyroidism, unspecified: Secondary | ICD-10-CM | POA: Diagnosis not present

## 2016-09-09 DIAGNOSIS — Y939 Activity, unspecified: Secondary | ICD-10-CM | POA: Diagnosis not present

## 2016-09-09 DIAGNOSIS — W19XXXA Unspecified fall, initial encounter: Secondary | ICD-10-CM

## 2016-09-09 MED ORDER — LIDOCAINE HCL (PF) 1 % IJ SOLN
5.0000 mL | Freq: Once | INTRAMUSCULAR | Status: AC
  Administered 2016-09-09: 5 mL
  Filled 2016-09-09: qty 5

## 2016-09-09 MED ORDER — TETANUS-DIPHTH-ACELL PERTUSSIS 5-2.5-18.5 LF-MCG/0.5 IM SUSP
0.5000 mL | Freq: Once | INTRAMUSCULAR | Status: AC
  Administered 2016-09-09: 0.5 mL via INTRAMUSCULAR
  Filled 2016-09-09: qty 0.5

## 2016-09-09 MED ORDER — ACETAMINOPHEN 325 MG PO TABS
650.0000 mg | ORAL_TABLET | Freq: Once | ORAL | Status: AC
  Administered 2016-09-09: 650 mg via ORAL
  Filled 2016-09-09: qty 2

## 2016-09-09 NOTE — ED Notes (Signed)
Spoke with pts daughter about pt condition. She states pt is ambulatory with walker, but gait is not very steady and she can not go far. Daughter aware pt receiving staples to left arm and dc.

## 2016-09-09 NOTE — ED Notes (Signed)
[name] with no [facility]. DC report given to pt daughter and [facility]. [facility]

## 2016-09-09 NOTE — Progress Notes (Signed)
Opened in error; Disregard.

## 2016-09-09 NOTE — ED Provider Notes (Signed)
[facility]-EMERGENCY DEPT Provider Note   CSN: [id number] Arrival date & time: 09/09/16  0225 By signing my name below, I, Bridgette Habermann, attest that this documentation has been prepared under the direction and in the presence of Zadie Rhine, MD. Electronically Signed: Bridgette Habermann, ED Scribe. 09/09/16. 3:31 AM.  History   Chief Complaint Chief Complaint  Patient presents with  . Fall    HPI The history is provided by the patient and the EMS personnel. No language interpreter was used.  Extremity Pain  This is a new problem. The current episode started 1 to 2 hours ago. The problem occurs constantly. The problem has not changed since onset.Pertinent negatives include no headaches. The symptoms are aggravated by bending, twisting and exertion. She has tried nothing for the symptoms.   HPI Comments: [name] [name] is a 83 y.o. female with h/o CAD, PE, CKD, and HTN, who presents to the Emergency Department by EMS complaining of right arm and right shoulder pain following a mechanical fall just PTA. Pt states she was trying to let her blinds down, made the wrong step, and subsequently fell on her right side. No LOC or head injury. She denies feeling dizzy prior to fall. Pt is regularly ambulatory with a walker. Denies any additional injuries. Pt further denies fever, chills, headache, or any other associated symptoms.   Past Medical History:  Diagnosis Date  . AKI (acute kidney injury) (HCC) 07/2015  . CAD (coronary artery disease)    a. 02/2001 PTCA & BMS to the OM1;  b. 08/2001 CBA to ISR in OM1, CBA of mid LAD;  c. 02/2002 Failed PCI of mLAD occlusion, LCX nl, patent OM1 stent, RCA dom, 40p, PDA/PL nl, EF 50% mild antlat HK.  . Cataract    both eyes  . Glaucoma   . Hypertension   . Hypertriglyceridemia   . Osteoarthritis   . Poor circulation   . Thyroid disease    hypothyroidism, hypothyroidism  . UTI (urinary tract infection) 08/24/2015    Patient Active Problem List   Diagnosis Date Noted    . Hypothyroidism 05/04/2016  . Klebsiella pneumoniae sepsis (HCC) 12/01/2015  . Hx pulmonary embolism 12/01/2015  . Palliative care encounter   . Pain in the chest   . Weakness   . Acute on chronic renal failure (HCC) 11/21/2015  . Acute respiratory failure with hypoxia (HCC) 11/21/2015  . CKD (chronic kidney disease) stage 3, GFR 30-59 ml/min 11/18/2015  . E. coli UTI (urinary tract infection) 08/28/2015  . GERD (gastroesophageal reflux disease) 08/28/2015  . Klebsiella cystitis 08/24/2015  . ARF (acute renal failure) (HCC) 08/24/2015  . Cough 09/24/2014  . Pain in joint, ankle and foot 04/15/2014  . Cellulitis 04/10/2014  . HLD (hyperlipidemia) 12/22/2013  . Renal insufficiency 11/06/2013  . Chest pain 10/26/2013  . Epigastric pain 10/26/2013  . OA (osteoarthritis) 10/03/2013  . Edema 03/03/2013  . Essential hypertension, benign 01/05/2013  . Anemia 01/05/2013  . Glaucoma 06/06/2012  . CAD (coronary artery disease) 06/05/2012    Past Surgical History:  Procedure Laterality Date  . ABDOMINAL HYSTERECTOMY  1972  . CARDIAC SURGERY  2001   stent placement  . EYE SURGERY  2007/2008   cataracts  . GALLBLADDER SURGERY  2009  . HERNIA REPAIR  2009  . HIP ARTHROPLASTY  06/06/2012   Procedure: ARTHROPLASTY BIPOLAR HIP;  Surgeon: Eldred Manges, MD;  Location: [facility];  Service: Orthopedics;  Laterality: Right;  monopolar    OB History  Gravida Para Term Preterm AB Living   5 4     1      SAB TAB Ectopic Multiple Live Births   1               Home Medications    Prior to Admission medications   Medication Sig Start Date End Date Taking? Authorizing Provider  acetaminophen (TYLENOL) 500 MG tablet Take 500 mg by mouth every 8 (eight) hours.    Yes Historical Provider, MD  aspirin 81 MG chewable tablet Chew 81 mg by mouth daily.   Yes Historical Provider, MD  bisacodyl (DULCOLAX) 10 MG suppository Place 10 mg rectally as needed for moderate constipation.   Yes Historical  Provider, MD  clopidogrel (PLAVIX) 75 MG tablet Take 75 mg by mouth daily.    Yes Historical Provider, MD  Dorzolamide HCl-Timolol Mal PF 22.3-6.8 MG/ML SOLN Place 1 drop into both eyes every 12 (twelve) hours.    Yes Historical Provider, MD  latanoprost (XALATAN) 0.005 % ophthalmic solution Place 1 drop into both eyes at bedtime.   Yes Historical Provider, MD  levothyroxine (SYNTHROID, LEVOTHROID) 50 MCG tablet Take 50 mcg by mouth daily before breakfast.   Yes Historical Provider, MD  loperamide (IMODIUM A-D) 2 MG tablet Give 2 tablets by mouth (4 mg total) after first loose stool. May give 1 tablet by mouth (2 mg) after each loose stool not to exceed 8 mg by mouth in 24 hours.   Yes Historical Provider, MD  magnesium hydroxide (MILK OF MAGNESIA) 400 MG/5ML suspension Take 30 mLs by mouth daily as needed for mild constipation.   Yes Historical Provider, MD  metoprolol tartrate (LOPRESSOR) 25 MG tablet Take 25 mg by mouth 2 (two) times daily.   Yes Historical Provider, MD  Multiple Vitamins-Minerals (MULTIVITAMIN WITH MINERALS) tablet Take 1 tablet by mouth daily.   Yes Historical Provider, MD  omeprazole (PRILOSEC) 40 MG capsule Take 40 mg by mouth daily.    Yes Historical Provider, MD  ondansetron (ZOFRAN ODT) 4 MG disintegrating tablet Take 1 tablet (4 mg total) by mouth every 8 (eight) hours as needed for nausea or vomiting. 11/24/15  Yes Rhetta MuraJai-Gurmukh Samtani, MD  Sodium Phosphates (RA SALINE ENEMA) 19-7 GM/118ML ENEM Place 1 each rectally as needed (for constipation).   Yes Historical Provider, MD  traMADol (ULTRAM) 50 MG tablet Take 25 mg by mouth at bedtime as needed.    Yes Historical Provider, MD    Family History Family History  Problem Relation Age of Onset  . Arthritis Father   . Stroke Father   . Hypertension Father   . Heart disease Sister   . Heart disease Brother   . Hypertension Brother   . Stroke Brother     Social History Social History  Substance Use Topics  . Smoking  status: Never Smoker  . Smokeless tobacco: Never Used  . Alcohol use No     Allergies   Codeine; Morphine and related; and Penicillins   Review of Systems Review of Systems  Constitutional: Negative for chills and fever.  Musculoskeletal: Positive for arthralgias and myalgias.  Neurological: Negative for syncope and headaches.  All other systems reviewed and are negative.  Physical Exam Updated Vital Signs BP 160/66 (BP Location: Right Arm)   Pulse 70   Temp 98.2 F (36.8 C) (Oral)   Resp 18   Ht 5\' 3"  (1.6 m)   Wt 160 lb (72.6 kg)   SpO2 96%   BMI 28.34 kg/m  Physical Exam CONSTITUTIONAL: Elderly and frail. HEAD: Normocephalic/atraumatic, no signs of trauma EYES: EOMI ENMT: Mucous membranes moist, no signs of trauma NECK: supple no meningeal signs SPINE/BACK:entire spine nontender, kyphotic spine, No bruising/crepitance/stepoffs noted to spine CV: S1/S2 noted, no murmurs/rubs/gallops noted LUNGS: Lungs are clear to auscultation bilaterally, no apparent distress ABDOMEN: soft, nontender NEURO: Pt is awake/alert/appropriate, moves all extremitiesx4.  No facial droop.   EXTREMITIES: pulses normal/equal, full ROM. Tenderness to palpation of right foot and right ankle. Tenderness to palpation of right forearm and right humerus. Laceration noted to left humerus. Pelvis stable, all other extremities/joints palpated/ranged and nontender. SKIN: warm, color normal PSYCH: no abnormalities of mood noted, alert and oriented to situation  ED Treatments / Results  DIAGNOSTIC STUDIES: Oxygen Saturation is 96% on RA, adequate by my interpretation.    COORDINATION OF CARE: 3:30 AM Discussed treatment plan with pt at bedside which includes x-ray and laceration repair left arm and pt agreed to plan.  Labs (all labs ordered are listed, but only abnormal results are displayed) Labs Reviewed - No data to display  EKG  EKG Interpretation  Date/Time:  Tuesday September 09 2016  02:57:39 EST Ventricular Rate:  71 PR Interval:    QRS Duration: 134 QT Interval:  434 QTC Calculation: 472 R Axis:   -45 Text Interpretation:  Sinus rhythm Prolonged PR interval RBBB and LAFB No significant change since last tracing Confirmed by Bebe Shaggy  MD, Jaylise Peek ([id number]) on 09/09/2016 3:11:31 AM       Radiology Dg Chest 2 View  Result Date: 09/09/2016 CLINICAL DATA:  83 year old post fall onto right side.  Pain. EXAM: CHEST  2 VIEW COMPARISON:  Chest radiograph 11/21/2015 FINDINGS: Stable cardiomegaly with tortuosity and atherosclerosis of the thoracic aorta. Coronary stent in place. No pulmonary edema, focal airspace disease, pleural fluid or pneumothorax. No evidence of acute rib fracture or acute osseous abnormality. Diffuse bony under mineralization. IMPRESSION: 1. No acute abnormality. 2. Stable cardiomegaly with tortuous atherosclerotic thoracic aorta. Electronically Signed   By: Rubye Oaks M.D.   On: 09/09/2016 04:25   Dg Forearm Right  Result Date: 09/09/2016 CLINICAL DATA:  83 year old post fall onto right side. Right arm pain from shoulder to forearm. EXAM: RIGHT FOREARM - 2 VIEW COMPARISON:  None. FINDINGS: Questionable impaction fracture to the distal radial metaphysis. Proximal radius is intact. The ulna is intact. No definite focal soft tissue abnormality. IMPRESSION: Questionable impaction fracture of the distal radial metaphysis. Recommend correlation for focal tenderness. Dedicated wrist radiographs may be helpful if there is pain distally. Electronically Signed   By: Rubye Oaks M.D.   On: 09/09/2016 04:20   Dg Ankle Complete Right  Result Date: 09/09/2016 CLINICAL DATA:  83 year old post fall onto right side. Right foot and ankle pain. EXAM: RIGHT ANKLE - COMPLETE 3+ VIEW COMPARISON:  Foot radiographs 04/10/2014 FINDINGS: There is no evidence of fracture, dislocation, or joint effusion. Plantar calcaneal spur and Achilles tendon enthesophyte. Linear density  adjacent to the medial malleolus is chronic and unchanged from prior foot radiographs. Soft tissue edema is most prominent medially. Vascular calcifications are seen. IMPRESSION: Soft tissue edema.  No fracture or subluxation of the right ankle. Electronically Signed   By: Rubye Oaks M.D.   On: 09/09/2016 04:24   Dg Humerus Right  Result Date: 09/09/2016 CLINICAL DATA:  83 year old post fall onto right side. Right arm pain from shoulder to forearm. EXAM: RIGHT HUMERUS - 2+ VIEW COMPARISON:  None. FINDINGS: There is no evidence of fracture  or other focal bone lesions. Small rounded calcification projecting over the proximal medial humerus may be a soft tissue calcification such as phlebolith or secondary to tendinous insertion. IMPRESSION: No acute fracture of the right humerus. Electronically Signed   By: Rubye Oaks M.D.   On: 09/09/2016 04:18   Dg Foot Complete Right  Result Date: 09/09/2016 CLINICAL DATA:  83 year old post fall onto right side. Right foot and ankle pain. EXAM: RIGHT FOOT COMPLETE - 3+ VIEW COMPARISON:  None. FINDINGS: Hammertoe deformity of the digits limits assessment. No evidence of acute fracture. The bones are under mineralized. Plantar calcaneal spur and Achilles tendon enthesophyte. Mild soft tissue edema. IMPRESSION: No acute fracture or subluxation of the right foot. Hammertoe deformity of the digits. Electronically Signed   By: Rubye Oaks M.D.   On: 09/09/2016 04:22    Procedures Procedures  LACERATION REPAIR Performed by: Joya Gaskins Consent: Verbal consent obtained. Risks and benefits: risks, benefits and alternatives were discussed Patient identity confirmed: provided demographic data Time out performed prior to procedure Prepped and Draped in normal sterile fashion Wound explored Laceration Location: left humerus Laceration Length: 1 cm No Foreign Bodies seen or palpated Anesthesia: local infiltration Local anesthetic: lidocaine %  without epinephrine Anesthetic total: 3 ml Irrigation method: syringe Amount of cleaning: standard Skin closure: simple Number of sutures or staples: 2 staples Technique: staples Patient tolerance: Patient tolerated the procedure well with no immediate complications.  Medications Ordered in ED Medications - No data to display   Initial Impression / Assessment and Plan / ED Course  I have reviewed the triage vital signs and the nursing notes.  Pertinent  imaging results that were available during my care of the patient were reviewed by me and considered in my medical decision making (see chart for details).  Clinical Course     5:00 AM Pt in the ED s/p mechanical fall at nursing facility She mostly has pain in right humerus Xray negative She can range the right UE and fully abduct and raise right arm No hip tenderness/pelvis stable No right wrist tenderness She has laceration to left humerus but minimal bony tenderness No indication for head imaging as no signs of head injury She is on plavix when I review med list I tried calling [facility] but no answer 5:55 AM Pt able to bear weight No new LE pain Will need to use wheelchair at facility as she has pain in right arm without fracture (usually uses walker) Nursing updated family   Final Clinical Impressions(s) / ED Diagnoses   Final diagnoses:  Fall, initial encounter  Laceration of left upper arm, initial encounter  Sprain of right upper arm, initial encounter  Sprain of right foot, initial encounter    New Prescriptions New Prescriptions   No medications on file   I personally performed the services described in this documentation, which was scribed in my presence. The recorded information has been reviewed and is accurate.        Zadie Rhine, MD 09/09/16 [phone]

## 2016-09-09 NOTE — ED Notes (Addendum)
Attempted to ambulate pt with walker, however pt complaining of right arm pain when bearing weight. Pt denies and leg/hip pain. Pt returned to bed.

## 2016-09-09 NOTE — Discharge Instructions (Signed)
YOU WILL NEED TO HAVE STAPLES REMOVED IN 10-14 DAYS

## 2016-09-09 NOTE — ED Notes (Signed)
Patient transported to X-ray 

## 2016-09-09 NOTE — ED Notes (Signed)
Hall Busingheresa Wicker (daughter) [phone] Call with with updates

## 2016-09-09 NOTE — ED Triage Notes (Signed)
Per EMS: pt fell on her right side today.  Pt has a minor skin tear on her left arm, that was wrapped by her nursing home. Pt c/o mostly right arm and shoulder pain. Pt currently on blood thinners. Pt denies hitting head or LOC.   Vitals   116/82 95% 72 hr

## 2016-09-09 NOTE — ED Notes (Signed)
Per Pt " I was trying to let my blinds down and I just made the wrong step and fell".

## 2016-09-12 ENCOUNTER — Non-Acute Institutional Stay (SKILLED_NURSING_FACILITY): Payer: Medicare Other | Admitting: Internal Medicine

## 2016-09-12 ENCOUNTER — Encounter: Payer: Self-pay | Admitting: Internal Medicine

## 2016-09-12 DIAGNOSIS — M25511 Pain in right shoulder: Secondary | ICD-10-CM

## 2016-09-12 NOTE — Progress Notes (Signed)
Location:  [facility] Room Number: [phone] Place of Service:  SNF [phone])  Randon Goldsmith. [name], MD  Patient Care Team: Kaleen Mask, MD as PCP - General [facility])  Extended Emergency Contact Information Primary Emergency Contact: Hall Busing Address: [address]          [place], [place] [address] [place] Home Phone: [phone] Relation: Daughter Secondary Emergency Contact: Garner,Cathy Address: [phone]          [place] [address] [place] Home Phone: [phone] Work Phone: [phone] Relation: Daughter    Allergies: Codeine; Morphine and related; and Penicillins  Chief Complaint  Patient presents with  . Acute Visit    Acute     HPI: Patient is 83 y.o. female who nursing and PT asked me to see. Pt fell a few days ago , went to ED and films were neg and pt still has R shoulder pain. She asked PT if she could wear a sling. Pt admits shoulder hurts with any movement at all, better when still.   Past Medical History:  Diagnosis Date  . AKI (acute kidney injury) (HCC) 07/2015  . CAD (coronary artery disease)    a. 02/2001 PTCA & BMS to the OM1;  b. 08/2001 CBA to ISR in OM1, CBA of mid LAD;  c. 02/2002 Failed PCI of mLAD occlusion, LCX nl, patent OM1 stent, RCA dom, 40p, PDA/PL nl, EF 50% mild antlat HK.  . Cataract    both eyes  . Glaucoma   . Hypertension   . Hypertriglyceridemia   . Osteoarthritis   . Poor circulation   . Thyroid disease    hypothyroidism, hypothyroidism  . UTI (urinary tract infection) 08/24/2015    Past Surgical History:  Procedure Laterality Date  . ABDOMINAL HYSTERECTOMY  1972  . CARDIAC SURGERY  2001   stent placement  . EYE SURGERY  2007/2008   cataracts  . GALLBLADDER SURGERY  2009  . HERNIA REPAIR  2009  . HIP ARTHROPLASTY  06/06/2012   Procedure: ARTHROPLASTY BIPOLAR HIP;  Surgeon: Eldred Manges, MD;  Location: [facility];  Service: Orthopedics;   Laterality: Right;  monopolar    Allergies as of 09/12/2016      Reactions   Codeine Nausea And Vomiting   Morphine And Related Nausea And Vomiting   Penicillins Hives      Medication List       Accurate as of 09/12/16  1:15 PM. Always use your most recent med list.          acetaminophen 500 MG tablet Commonly known as:  TYLENOL Take 500 mg by mouth every 8 (eight) hours.   aspirin 81 MG chewable tablet Chew 81 mg by mouth daily.   bisacodyl 10 MG suppository Commonly known as:  DULCOLAX Place 10 mg rectally as needed for moderate constipation.   clopidogrel 75 MG tablet Commonly known as:  PLAVIX Take 75 mg by mouth daily.   Dorzolamide HCl-Timolol Mal PF 22.3-6.8 MG/ML Soln Place 1 drop into both eyes every 12 (twelve) hours.   latanoprost 0.005 % ophthalmic solution Commonly known as:  XALATAN Place 1 drop into both eyes at bedtime.   levothyroxine 50 MCG tablet Commonly known as:  SYNTHROID, LEVOTHROID Take 50 mcg by mouth daily before breakfast.   loperamide 2 MG tablet Commonly known as:  IMODIUM A-D Give 2 tablets by mouth (4 mg total) after first loose stool. May give 1 tablet  by mouth (2 mg) after each loose stool not to exceed 8 mg by mouth in 24 hours.   magnesium hydroxide 400 MG/5ML suspension Commonly known as:  MILK OF MAGNESIA Take 30 mLs by mouth daily as needed for mild constipation.   metoprolol tartrate 25 MG tablet Commonly known as:  LOPRESSOR Take 25 mg by mouth 2 (two) times daily.   multivitamin with minerals tablet Take 1 tablet by mouth daily.   omeprazole 40 MG capsule Commonly known as:  PRILOSEC Take 40 mg by mouth daily.   ondansetron 4 MG disintegrating tablet Commonly known as:  ZOFRAN ODT Take 1 tablet (4 mg total) by mouth every 8 (eight) hours as needed for nausea or vomiting.   RA SALINE ENEMA 19-7 GM/118ML Enem Place 1 each rectally as needed (for constipation).   traMADol 50 MG tablet Commonly known as:   ULTRAM Take 25 mg by mouth at bedtime as needed.       No orders of the defined types were placed in this encounter.   Immunization History  Administered Date(s) Administered  . Influenza Whole 05/30/2013  . Influenza-Unspecified 06/23/2014, 05/21/2015, 05/24/2016  . PPD Test 10/26/2013  . Pneumococcal-Unspecified 08/23/2013  . Tdap 09/09/2016    Social History  Substance Use Topics  . Smoking status: Never Smoker  . Smokeless tobacco: Never Used  . Alcohol use No    Review of Systems  DATA OBTAINED: from patient, son GENERAL:  no fevers, fatigue, appetite changes SKIN: No itching, rash HEENT: No complaint RESPIRATORY: No cough, wheezing, SOB CARDIAC: No chest pain, palpitations, lower extremity edema  GI: No abdominal pain, No N/V/D or constipation, No heartburn or reflux  GU: No dysuria, frequency or urgency, or incontinence  MUSCULOSKELETAL: R shoulder pain as per HPI NEUROLOGIC: No headache, dizziness  PSYCHIATRIC: No overt anxiety or sadness  Vitals:   09/12/16 1235  BP: 130/61  Pulse: 65  Resp: 18  Temp: 97.3 F (36.3 C)   Body mass index is 30.11 kg/m. Physical Exam  GENERAL APPEARANCE: Alert, conversant, No acute distress  SKIN: No diaphoresis rash HEENT: Unremarkable RESPIRATORY: Breathing is even, unlabored. Lung sounds are clear   CARDIOVASCULAR: Heart RRR no murmurs, rubs or gallops. No peripheral edema  GASTROINTESTINAL: Abdomen is soft, non-tender, not distended w/ normal bowel sounds.  GENITOURINARY: Bladder non tender, not distended  MUSCULOSKELETAL: holding R arm in adduction next to body NEUROLOGIC: Cranial nerves 2-12 grossly intact. Moves all extremities PSYCHIATRIC: Mood and affect appropriate to situation with dementia, no behavioral issues  Patient Active Problem List   Diagnosis Date Noted  . Hypothyroidism 05/04/2016  . Klebsiella pneumoniae sepsis (HCC) 12/01/2015  . Hx pulmonary embolism 12/01/2015  . Palliative care  encounter   . Pain in the chest   . Weakness   . Acute on chronic renal failure (HCC) 11/21/2015  . Acute respiratory failure with hypoxia (HCC) 11/21/2015  . CKD (chronic kidney disease) stage 3, GFR 30-59 ml/min 11/18/2015  . E. coli UTI (urinary tract infection) 08/28/2015  . GERD (gastroesophageal reflux disease) 08/28/2015  . Klebsiella cystitis 08/24/2015  . ARF (acute renal failure) (HCC) 08/24/2015  . Cough 09/24/2014  . Pain in joint, ankle and foot 04/15/2014  . Cellulitis 04/10/2014  . HLD (hyperlipidemia) 12/22/2013  . Renal insufficiency 11/06/2013  . Chest pain 10/26/2013  . Epigastric pain 10/26/2013  . OA (osteoarthritis) 10/03/2013  . Edema 03/03/2013  . Essential hypertension, benign 01/05/2013  . Anemia 01/05/2013  . Glaucoma 06/06/2012  .  CAD (coronary artery disease) 06/05/2012    CMP     Component Value Date/Time   NA 140 05/07/2016   K 4.8 05/07/2016   CL 102 11/24/2015 0353   CO2 23 11/24/2015 0353   GLUCOSE 79 11/24/2015 0353   BUN 31 (A) 05/07/2016   CREATININE 1.5 (A) 05/07/2016   CREATININE 1.31 (H) 11/24/2015 0353   CALCIUM 8.6 (L) 11/24/2015 0353   PROT 6.7 11/24/2015 0353   ALBUMIN 2.9 (L) 11/24/2015 0353   AST 21 05/07/2016   ALT 10 05/07/2016   ALKPHOS 78 05/07/2016   BILITOT 0.6 11/24/2015 0353   GFRNONAA 34 (L) 11/24/2015 0353   GFRAA 39 (L) 11/24/2015 0353    Recent Labs  11/21/15 1847 11/23/15 0450 11/24/15 0353  03/12/16 03/18/16 05/07/16  NA 136 136 136  < > 138 138 140  K 4.7 4.3 3.9  < > 4.5 4.6 4.8  CL 98* 103 102  --   --   --   --   CO2 26 26 23   --   --   --   --   GLUCOSE 125* 90 79  --   --   --   --   BUN 32* 31* 20  < > 34* 36* 31*  CREATININE 2.02* 1.77* 1.31*  < > 2.1* 1.8* 1.5*  CALCIUM 9.1 8.6* 8.6*  --   --   --   --   < > = values in this interval not displayed.  Recent Labs  11/24/15 0353 03/05/16 05/07/16  AST 48* 18 21  ALT 22 9 10   ALKPHOS 66 76 78  BILITOT 0.6  --   --   PROT 6.7  --   --    ALBUMIN 2.9*  --   --     Recent Labs  11/21/15 1847 11/23/15 0450 11/24/15 0353  03/05/16 03/12/16 05/07/16  WBC 10.4 5.3 4.5  < > 8.5 7.5 7.9  NEUTROABS 7.2  --   --   --   --   --   --   HGB 12.1 10.7* 11.4*  < > 10.3* 10.5* 11.1*  HCT 39.0 35.2* 36.7  < > 30* 29* 34*  MCV 97.7 97.5 95.6  --   --   --   --   PLT 218 195 199  < > 247 317 272  < > = values in this interval not displayed.  Recent Labs  12/07/15 05/07/16  CHOL 153 192  LDLCALC 81 78  TRIG 210* 383*   No results found for: [facility] Lab Results  Component Value Date   TSH 9.31 (A) 06/13/2016   Lab Results  Component Value Date   HGBA1C 5.4 05/07/2016   Lab Results  Component Value Date   CHOL 192 05/07/2016   HDL 37 05/07/2016   LDLCALC 78 05/07/2016   TRIG 383 (A) 05/07/2016   CHOLHDL 5.2 10/27/2013    Significant Diagnostic Results in last 30 days:  Dg Chest 2 View  Result Date: 09/09/2016 CLINICAL DATA:  83 year old post fall onto right side.  Pain. EXAM: CHEST  2 VIEW COMPARISON:  Chest radiograph 11/21/2015 FINDINGS: Stable cardiomegaly with tortuosity and atherosclerosis of the thoracic aorta. Coronary stent in place. No pulmonary edema, focal airspace disease, pleural fluid or pneumothorax. No evidence of acute rib fracture or acute osseous abnormality. Diffuse bony under mineralization. IMPRESSION: 1. No acute abnormality. 2. Stable cardiomegaly with tortuous atherosclerotic thoracic aorta. Electronically Signed   By: Lujean Rave.D.  On: 09/09/2016 04:25   Dg Forearm Right  Result Date: 09/09/2016 CLINICAL DATA:  83 year old post fall onto right side. Right arm pain from shoulder to forearm. EXAM: RIGHT FOREARM - 2 VIEW COMPARISON:  None. FINDINGS: Questionable impaction fracture to the distal radial metaphysis. Proximal radius is intact. The ulna is intact. No definite focal soft tissue abnormality. IMPRESSION: Questionable impaction fracture of the distal radial metaphysis. Recommend  correlation for focal tenderness. Dedicated wrist radiographs may be helpful if there is pain distally. Electronically Signed   By: Rubye OaksMelanie  Ehinger M.D.   On: 09/09/2016 04:20   Dg Ankle Complete Right  Result Date: 09/09/2016 CLINICAL DATA:  83 year old post fall onto right side. Right foot and ankle pain. EXAM: RIGHT ANKLE - COMPLETE 3+ VIEW COMPARISON:  Foot radiographs 04/10/2014 FINDINGS: There is no evidence of fracture, dislocation, or joint effusion. Plantar calcaneal spur and Achilles tendon enthesophyte. Linear density adjacent to the medial malleolus is chronic and unchanged from prior foot radiographs. Soft tissue edema is most prominent medially. Vascular calcifications are seen. IMPRESSION: Soft tissue edema.  No fracture or subluxation of the right ankle. Electronically Signed   By: Rubye OaksMelanie  Ehinger M.D.   On: 09/09/2016 04:24   Dg Humerus Right  Result Date: 09/09/2016 CLINICAL DATA:  83 year old post fall onto right side. Right arm pain from shoulder to forearm. EXAM: RIGHT HUMERUS - 2+ VIEW COMPARISON:  None. FINDINGS: There is no evidence of fracture or other focal bone lesions. Small rounded calcification projecting over the proximal medial humerus may be a soft tissue calcification such as phlebolith or secondary to tendinous insertion. IMPRESSION: No acute fracture of the right humerus. Electronically Signed   By: Rubye OaksMelanie  Ehinger M.D.   On: 09/09/2016 04:18   Dg Foot Complete Right  Result Date: 09/09/2016 CLINICAL DATA:  83 year old post fall onto right side. Right foot and ankle pain. EXAM: RIGHT FOOT COMPLETE - 3+ VIEW COMPARISON:  None. FINDINGS: Hammertoe deformity of the digits limits assessment. No evidence of acute fracture. The bones are under mineralized. Plantar calcaneal spur and Achilles tendon enthesophyte. Mild soft tissue edema. IMPRESSION: No acute fracture or subluxation of the right foot. Hammertoe deformity of the digits. Electronically Signed   By: Rubye OaksMelanie   Ehinger M.D.   On: 09/09/2016 04:22    Assessment and Plan  R SHOULDER PAIN /STRAIN - per harley from the PT view it is Ok for pt to be in sling. I feel itis OK also as long as pt takes it off daily with appropriate weightless ROM; have written for sling for 2 weeks and tylenol 1000 mg BID and q HS for 2 weeks; will monitor in case pt has something like a rotator cuff tear     Time spent . 25 min;> 50% of time with patient was spent reviewing records, labs, tests and studies, counseling and developing plan of care  Randon Goldsmithnne D. Lyn HollingsheadAlexander, MD

## 2016-09-13 ENCOUNTER — Encounter: Payer: Self-pay | Admitting: Internal Medicine

## 2016-09-17 ENCOUNTER — Encounter: Payer: Self-pay | Admitting: Internal Medicine

## 2016-09-17 ENCOUNTER — Non-Acute Institutional Stay (SKILLED_NURSING_FACILITY): Payer: Medicare Other | Admitting: Internal Medicine

## 2016-09-17 DIAGNOSIS — M150 Primary generalized (osteo)arthritis: Secondary | ICD-10-CM

## 2016-09-17 DIAGNOSIS — K219 Gastro-esophageal reflux disease without esophagitis: Secondary | ICD-10-CM

## 2016-09-17 DIAGNOSIS — M159 Polyosteoarthritis, unspecified: Secondary | ICD-10-CM

## 2016-09-17 DIAGNOSIS — H409 Unspecified glaucoma: Secondary | ICD-10-CM

## 2016-09-17 NOTE — Progress Notes (Signed)
Location:  [facility] Room Number: [phone] Place of Service:  SNF [phone])  [name] [name]. [name] Hollingshead, MD  Patient Care Team: Kaleen Mask, MD as PCP - General [facility])  Extended Emergency Contact Information Primary Emergency Contact: Hall Busing Address: [address]          [place], [place] [address] [place] Home Phone: [phone] Relation: Daughter Secondary Emergency Contact: Garner,Cathy Address: [phone]          [place] [address] [place] Home Phone: [phone] Work Phone: [phone] Relation: Daughter    Allergies: Codeine; Morphine and related; and Penicillins  Chief Complaint  Patient presents with  . Medical Management of Chronic Issues    Routine Visit    HPI: Patient is 83 y.o. female who is being seen for routine issues of OA, glaucoma and GERD.  Past Medical History:  Diagnosis Date  . AKI (acute kidney injury) (HCC) 07/2015  . CAD (coronary artery disease)    a. 02/2001 PTCA & BMS to the OM1;  b. 08/2001 CBA to ISR in OM1, CBA of mid LAD;  c. 02/2002 Failed PCI of mLAD occlusion, LCX nl, patent OM1 stent, RCA dom, 40p, PDA/PL nl, EF 50% mild antlat HK.  . Cataract    both eyes  . Glaucoma   . Hypertension   . Hypertriglyceridemia   . Osteoarthritis   . Poor circulation   . Thyroid disease    hypothyroidism, hypothyroidism  . UTI (urinary tract infection) 08/24/2015    Past Surgical History:  Procedure Laterality Date  . ABDOMINAL HYSTERECTOMY  1972  . CARDIAC SURGERY  2001   stent placement  . EYE SURGERY  2007/2008   cataracts  . GALLBLADDER SURGERY  2009  . HERNIA REPAIR  2009  . HIP ARTHROPLASTY  06/06/2012   Procedure: ARTHROPLASTY BIPOLAR HIP;  Surgeon: Eldred Manges, MD;  Location: [facility];  Service: Orthopedics;  Laterality: Right;  monopolar    Allergies as of 09/17/2016      Reactions   Codeine Nausea And Vomiting   Morphine And Related Nausea And  Vomiting   Penicillins Hives      Medication List       Accurate as of 09/17/16 11:59 PM. Always use your most recent med list.          acetaminophen 500 MG tablet Commonly known as:  TYLENOL Take 500 mg by mouth every 8 (eight) hours.   aspirin 81 MG chewable tablet Chew 81 mg by mouth daily.   bisacodyl 10 MG suppository Commonly known as:  DULCOLAX Place 10 mg rectally as needed for moderate constipation.   clopidogrel 75 MG tablet Commonly known as:  PLAVIX Take 75 mg by mouth daily.   Dorzolamide HCl-Timolol Mal PF 22.3-6.8 MG/ML Soln Place 1 drop into both eyes every 12 (twelve) hours.   latanoprost 0.005 % ophthalmic solution Commonly known as:  XALATAN Place 1 drop into both eyes at bedtime.   levothyroxine 50 MCG tablet Commonly known as:  SYNTHROID, LEVOTHROID Take 50 mcg by mouth daily before breakfast.   loperamide 2 MG tablet Commonly known as:  IMODIUM A-D Give 2 tablets by mouth (4 mg total) after first loose stool. May give 1 tablet by mouth (2 mg) after each loose stool not to exceed 8 mg by mouth in 24 hours.   magnesium hydroxide 400 MG/5ML suspension Commonly known as:  MILK OF MAGNESIA Take 30 mLs by mouth  daily as needed for mild constipation.   metoprolol tartrate 25 MG tablet Commonly known as:  LOPRESSOR Take 25 mg by mouth 2 (two) times daily.   multivitamin with minerals tablet Take 1 tablet by mouth daily.   omeprazole 40 MG capsule Commonly known as:  PRILOSEC Take 40 mg by mouth daily.   ondansetron 4 MG disintegrating tablet Commonly known as:  ZOFRAN ODT Take 1 tablet (4 mg total) by mouth every 8 (eight) hours as needed for nausea or vomiting.   RA SALINE ENEMA 19-7 GM/118ML Enem Place 1 each rectally as needed (for constipation).   traMADol 50 MG tablet Commonly known as:  ULTRAM Take 25 mg by mouth at bedtime as needed.       No orders of the defined types were placed in this encounter.   Immunization History   Administered Date(s) Administered  . Influenza Whole 05/30/2013  . Influenza-Unspecified 06/23/2014, 05/21/2015, 05/24/2016  . PPD Test 10/26/2013  . Pneumococcal-Unspecified 08/23/2013  . Tdap 09/09/2016    Social History  Substance Use Topics  . Smoking status: Never Smoker  . Smokeless tobacco: Never Used  . Alcohol use No    Review of Systems  DATA OBTAINED: from patient, nurse GENERAL:  no fevers, fatigue, appetite changes SKIN: No itching, rash HEENT: No complaint RESPIRATORY: No cough, wheezing, SOB CARDIAC: No chest pain, palpitations, lower extremity edema  GI: No abdominal pain, No N/V/D or constipation, No heartburn or reflux  GU: No dysuria, frequency or urgency, or incontinence  MUSCULOSKELETAL: still R shoulder pain NEUROLOGIC: No headache, dizziness  PSYCHIATRIC: No overt anxiety or sadness  Vitals:   09/17/16 0946  BP: 130/61  Pulse: 75  Resp: 18  Temp: 97 F (36.1 C)   Body mass index is 30.11 kg/m. Physical Exam  GENERAL APPEARANCE: Alert, conversant, No acute distress  SKIN: No diaphoresis rash HEENT: Unremarkable RESPIRATORY: Breathing is even, unlabored. Lung sounds are clear   CARDIOVASCULAR: Heart RRR no murmurs, rubs or gallops. No peripheral edema  GASTROINTESTINAL: Abdomen is soft, non-tender, not distended w/ normal bowel sounds.  GENITOURINARY: Bladder non tender, not distended  MUSCULOSKELETAL: No abnormal joints or musculature NEUROLOGIC: Cranial nerves 2-12 grossly intact. Moves all extremities PSYCHIATRIC: Mood and affect appropriate to situation with dementia, no behavioral issues  Patient Active Problem List   Diagnosis Date Noted  . Hypothyroidism 05/04/2016  . Klebsiella pneumoniae sepsis (HCC) 12/01/2015  . Hx pulmonary embolism 12/01/2015  . Palliative care encounter   . Pain in the chest   . Weakness   . Acute on chronic renal failure (HCC) 11/21/2015  . Acute respiratory failure with hypoxia (HCC) 11/21/2015  .  CKD (chronic kidney disease) stage 3, GFR 30-59 ml/min 11/18/2015  . E. coli UTI (urinary tract infection) 08/28/2015  . GERD (gastroesophageal reflux disease) 08/28/2015  . Klebsiella cystitis 08/24/2015  . ARF (acute renal failure) (HCC) 08/24/2015  . Cough 09/24/2014  . Pain in joint, ankle and foot 04/15/2014  . Cellulitis 04/10/2014  . HLD (hyperlipidemia) 12/22/2013  . Renal insufficiency 11/06/2013  . Chest pain 10/26/2013  . Epigastric pain 10/26/2013  . OA (osteoarthritis) 10/03/2013  . Edema 03/03/2013  . Essential hypertension, benign 01/05/2013  . Anemia 01/05/2013  . Glaucoma 06/06/2012  . CAD (coronary artery disease) 06/05/2012    CMP     Component Value Date/Time   NA 140 05/07/2016   K 4.8 05/07/2016   CL 102 11/24/2015 0353   CO2 23 11/24/2015 0353   GLUCOSE 79  11/24/2015 0353   BUN 31 (A) 05/07/2016   CREATININE 1.5 (A) 05/07/2016   CREATININE 1.31 (H) 11/24/2015 0353   CALCIUM 8.6 (L) 11/24/2015 0353   PROT 6.7 11/24/2015 0353   ALBUMIN 2.9 (L) 11/24/2015 0353   AST 21 05/07/2016   ALT 10 05/07/2016   ALKPHOS 78 05/07/2016   BILITOT 0.6 11/24/2015 0353   GFRNONAA 34 (L) 11/24/2015 0353   GFRAA 39 (L) 11/24/2015 0353    Recent Labs  11/21/15 1847 11/23/15 0450 11/24/15 0353  03/12/16 03/18/16 05/07/16  NA 136 136 136  < > 138 138 140  K 4.7 4.3 3.9  < > 4.5 4.6 4.8  CL 98* 103 102  --   --   --   --   CO2 26 26 23   --   --   --   --   GLUCOSE 125* 90 79  --   --   --   --   BUN 32* 31* 20  < > 34* 36* 31*  CREATININE 2.02* 1.77* 1.31*  < > 2.1* 1.8* 1.5*  CALCIUM 9.1 8.6* 8.6*  --   --   --   --   < > = values in this interval not displayed.  Recent Labs  11/24/15 0353 03/05/16 05/07/16  AST 48* 18 21  ALT 22 9 10   ALKPHOS 66 76 78  BILITOT 0.6  --   --   PROT 6.7  --   --   ALBUMIN 2.9*  --   --     Recent Labs  11/21/15 1847 11/23/15 0450 11/24/15 0353  03/05/16 03/12/16 05/07/16  WBC 10.4 5.3 4.5  < > 8.5 7.5 7.9    NEUTROABS 7.2  --   --   --   --   --   --   HGB 12.1 10.7* 11.4*  < > 10.3* 10.5* 11.1*  HCT 39.0 35.2* 36.7  < > 30* 29* 34*  MCV 97.7 97.5 95.6  --   --   --   --   PLT 218 195 199  < > 247 317 272  < > = values in this interval not displayed.  Recent Labs  12/07/15 05/07/16  CHOL 153 192  LDLCALC 81 78  TRIG 210* 383*   No results found for: [facility] Lab Results  Component Value Date   TSH 9.31 (A) 06/13/2016   Lab Results  Component Value Date   HGBA1C 5.4 05/07/2016   Lab Results  Component Value Date   CHOL 192 05/07/2016   HDL 37 05/07/2016   LDLCALC 78 05/07/2016   TRIG 383 (A) 05/07/2016   CHOLHDL 5.2 10/27/2013    Significant Diagnostic Results in last 30 days:  Dg Chest 2 View  Result Date: 09/09/2016 CLINICAL DATA:  83 year old post fall onto right side.  Pain. EXAM: CHEST  2 VIEW COMPARISON:  Chest radiograph 11/21/2015 FINDINGS: Stable cardiomegaly with tortuosity and atherosclerosis of the thoracic aorta. Coronary stent in place. No pulmonary edema, focal airspace disease, pleural fluid or pneumothorax. No evidence of acute rib fracture or acute osseous abnormality. Diffuse bony under mineralization. IMPRESSION: 1. No acute abnormality. 2. Stable cardiomegaly with tortuous atherosclerotic thoracic aorta. Electronically Signed   By: Rubye Oaks M.D.   On: 09/09/2016 04:25   Dg Forearm Right  Result Date: 09/09/2016 CLINICAL DATA:  83 year old post fall onto right side. Right arm pain from shoulder to forearm. EXAM: RIGHT FOREARM - 2 VIEW COMPARISON:  None. FINDINGS: Questionable  impaction fracture to the distal radial metaphysis. Proximal radius is intact. The ulna is intact. No definite focal soft tissue abnormality. IMPRESSION: Questionable impaction fracture of the distal radial metaphysis. Recommend correlation for focal tenderness. Dedicated wrist radiographs may be helpful if there is pain distally. Electronically Signed   By: Rubye OaksMelanie  Ehinger M.D.    On: 09/09/2016 04:20   Dg Ankle Complete Right  Result Date: 09/09/2016 CLINICAL DATA:  83 year old post fall onto right side. Right foot and ankle pain. EXAM: RIGHT ANKLE - COMPLETE 3+ VIEW COMPARISON:  Foot radiographs 04/10/2014 FINDINGS: There is no evidence of fracture, dislocation, or joint effusion. Plantar calcaneal spur and Achilles tendon enthesophyte. Linear density adjacent to the medial malleolus is chronic and unchanged from prior foot radiographs. Soft tissue edema is most prominent medially. Vascular calcifications are seen. IMPRESSION: Soft tissue edema.  No fracture or subluxation of the right ankle. Electronically Signed   By: Rubye OaksMelanie  Ehinger M.D.   On: 09/09/2016 04:24   Dg Humerus Right  Result Date: 09/09/2016 CLINICAL DATA:  83 year old post fall onto right side. Right arm pain from shoulder to forearm. EXAM: RIGHT HUMERUS - 2+ VIEW COMPARISON:  None. FINDINGS: There is no evidence of fracture or other focal bone lesions. Small rounded calcification projecting over the proximal medial humerus may be a soft tissue calcification such as phlebolith or secondary to tendinous insertion. IMPRESSION: No acute fracture of the right humerus. Electronically Signed   By: Rubye OaksMelanie  Ehinger M.D.   On: 09/09/2016 04:18   Dg Foot Complete Right  Result Date: 09/09/2016 CLINICAL DATA:  83 year old post fall onto right side. Right foot and ankle pain. EXAM: RIGHT FOOT COMPLETE - 3+ VIEW COMPARISON:  None. FINDINGS: Hammertoe deformity of the digits limits assessment. No evidence of acute fracture. The bones are under mineralized. Plantar calcaneal spur and Achilles tendon enthesophyte. Mild soft tissue edema. IMPRESSION: No acute fracture or subluxation of the right foot. Hammertoe deformity of the digits. Electronically Signed   By: Rubye OaksMelanie  Ehinger M.D.   On: 09/09/2016 04:22    Assessment and Plan  OA (osteoarthritis) No reports of uncontrolled pain, except for shoulder which is probably  not arthritis; olan to cnt tylenol with tramadol as back up for a bad day  Glaucoma Stable and chronic; lplan to cont xalatin and cosopt  GERD (gastroesophageal reflux disease) No reports of heartburn or reflux ;plan to cont 40 mg omeprazole daily and cont to [employer]    Mercedies Ganesh D. [name] HollingsheadAlexander, MD

## 2016-09-25 ENCOUNTER — Non-Acute Institutional Stay (SKILLED_NURSING_FACILITY): Payer: Medicare Other | Admitting: Internal Medicine

## 2016-09-25 DIAGNOSIS — R6889 Other general symptoms and signs: Secondary | ICD-10-CM | POA: Diagnosis not present

## 2016-09-25 DIAGNOSIS — Z20828 Contact with and (suspected) exposure to other viral communicable diseases: Secondary | ICD-10-CM

## 2016-09-26 LAB — BASIC METABOLIC PANEL
BUN: 48 mg/dL — AB (ref 4–21)
Creatinine: 1.6 mg/dL — AB (ref 0.5–1.1)
GLUCOSE: 91 mg/dL
Potassium: 4.6 mmol/L (ref 3.4–5.3)
Sodium: 140 mmol/L (ref 137–147)

## 2016-09-26 LAB — CBC AND DIFFERENTIAL
HEMATOCRIT: 35 % — AB (ref 36–46)
HEMOGLOBIN: 11.2 g/dL — AB (ref 12.0–16.0)
PLATELETS: 286 10*3/uL (ref 150–399)
WBC: 8.5 10*3/mL

## 2016-10-04 ENCOUNTER — Encounter: Payer: Self-pay | Admitting: Internal Medicine

## 2016-10-04 NOTE — Assessment & Plan Note (Signed)
Stable and chronic; lplan to cont xalatin and cosopt

## 2016-10-04 NOTE — Assessment & Plan Note (Signed)
No reports of heartburn or reflux ;plan to cont 40 mg omeprazole daily and cont to moniotr

## 2016-10-04 NOTE — Assessment & Plan Note (Signed)
No reports of uncontrolled pain, except for shoulder which is probably not arthritis; olan to cnt tylenol with tramadol as back up for a bad day

## 2016-10-16 ENCOUNTER — Non-Acute Institutional Stay (SKILLED_NURSING_FACILITY): Payer: Medicare Other | Admitting: Internal Medicine

## 2016-10-16 ENCOUNTER — Encounter: Payer: Self-pay | Admitting: Internal Medicine

## 2016-10-16 DIAGNOSIS — D508 Other iron deficiency anemias: Secondary | ICD-10-CM

## 2016-10-16 DIAGNOSIS — I10 Essential (primary) hypertension: Secondary | ICD-10-CM | POA: Diagnosis not present

## 2016-10-16 DIAGNOSIS — E034 Atrophy of thyroid (acquired): Secondary | ICD-10-CM

## 2016-10-16 NOTE — Progress Notes (Signed)
[facility] Room Number: [phone] Place of Service:  SNF [phone])  Randon Goldsmithnne D. Lyn HollingsheadAlexander, MD  Patient Care Team: Kaleen MaskWilson Oliver Elkins, MD as PCP - General [facility])  Extended Emergency Contact Information Primary Emergency Contact: Hall BusingWicker,Theresa Address: [address]          [place], [place] [id number] [place] Home Phone: [phone] Relation: Daughter Secondary Emergency Contact: Garner,Cathy Address: [phone]          [place] [id number] [place] Home Phone: [phone] Work Phone: [phone] Relation: Daughter    Allergies: Codeine; Morphine and related; and Penicillins  Chief Complaint  Patient presents with  . Medical Management of Chronic Issues    Routine Visit    HPI: Patient is 83 y.o. female who is being seen for routine issues of anemia, hypothyroidism and HTN.  Past Medical History:  Diagnosis Date  . AKI (acute kidney injury) (HCC) 07/2015  . CAD (coronary artery disease)    a. 02/2001 PTCA & BMS to the OM1;  b. 08/2001 CBA to ISR in OM1, CBA of mid LAD;  c. 02/2002 Failed PCI of mLAD occlusion, LCX nl, patent OM1 stent, RCA dom, 40p, PDA/PL nl, EF 50% mild antlat HK.  . Cataract    both eyes  . Glaucoma   . Hypertension   . Hypertriglyceridemia   . Osteoarthritis   . Poor circulation   . Thyroid disease    hypothyroidism, hypothyroidism  . UTI (urinary tract infection) 08/24/2015    Past Surgical History:  Procedure Laterality Date  . ABDOMINAL HYSTERECTOMY  1972  . CARDIAC SURGERY  2001   stent placement  . EYE SURGERY  2007/2008   cataracts  . GALLBLADDER SURGERY  2009  . HERNIA REPAIR  2009  . HIP ARTHROPLASTY  06/06/2012   Procedure: ARTHROPLASTY BIPOLAR HIP;  Surgeon: Eldred MangesMark C Yates, MD;  Location: [facility];  Service: Orthopedics;  Laterality: Right;  monopolar    Allergies as of 10/16/2016      Reactions   Codeine Nausea And Vomiting   Morphine And Related  Nausea And Vomiting   Penicillins Hives      Medication List       Accurate as of 10/16/16 11:59 PM. Always use your most recent med list.          acetaminophen 500 MG tablet Commonly known as:  TYLENOL Take 500 mg by mouth every 8 (eight) hours.   aspirin 81 MG chewable tablet Chew 81 mg by mouth daily.   bisacodyl 10 MG suppository Commonly known as:  DULCOLAX Place 10 mg rectally as needed for moderate constipation.   clopidogrel 75 MG tablet Commonly known as:  PLAVIX Take 75 mg by mouth daily.   dorzolamidel-timolol 22.3-6.8 MG/ML Soln ophthalmic solution Commonly known as:  COSOPT Place 1 drop into both eyes every 12 (twelve) hours.   latanoprost 0.005 % ophthalmic solution Commonly known as:  XALATAN Place 1 drop into both eyes at bedtime.   levothyroxine 50 MCG tablet Commonly known as:  SYNTHROID, LEVOTHROID Take 50 mcg by mouth daily before breakfast.   loperamide 2 MG tablet Commonly known as:  IMODIUM A-D Give 2 tablets by mouth (4 mg total) after first loose stool. May give 1 tablet by mouth (2 mg) after each loose stool not to exceed 8 mg by mouth in 24 hours.   magnesium hydroxide 400 MG/5ML suspension Commonly known as:  MILK OF MAGNESIA Take  30 mLs by mouth daily as needed for mild constipation.   metoprolol tartrate 25 MG tablet Commonly known as:  LOPRESSOR Take 25 mg by mouth 2 (two) times daily.   multivitamin with minerals tablet Take 1 tablet by mouth daily.   omeprazole 40 MG capsule Commonly known as:  PRILOSEC Take 40 mg by mouth daily.   ondansetron 4 MG disintegrating tablet Commonly known as:  ZOFRAN ODT Take 1 tablet (4 mg total) by mouth every 8 (eight) hours as needed for nausea or vomiting.   RA SALINE ENEMA 19-7 GM/118ML Enem Place 1 each rectally as needed (for constipation).   traMADol 50 MG tablet Commonly known as:  ULTRAM Take 25 mg by mouth at bedtime as needed.       No orders of the defined types were  placed in this encounter.   Immunization History  Administered Date(s) Administered  . Influenza Whole 05/30/2013  . Influenza-Unspecified 06/23/2014, 05/21/2015, 05/24/2016  . PPD Test 10/26/2013  . Pneumococcal-Unspecified 08/23/2013  . Tdap 09/09/2016    Social History  Substance Use Topics  . Smoking status: Never Smoker  . Smokeless tobacco: Never Used  . Alcohol use No    Review of Systems  DATA OBTAINED: from patient, nurse GENERAL:  no fevers, fatigue, appetite changes SKIN: No itching, rash HEENT: No complaint RESPIRATORY: No cough, wheezing, SOB CARDIAC: No chest pain, palpitations, lower extremity edema  GI: No abdominal pain, No N/V/D or constipation, No heartburn or reflux  GU: No dysuria, frequency or urgency, or incontinence  MUSCULOSKELETAL: No unrelieved bone/joint pain NEUROLOGIC: No headache, dizziness  PSYCHIATRIC: No overt anxiety or sadness  Vitals:   10/16/16 1058  BP: 130/61  Pulse: 73  Resp: 18  Temp: (!) 96.1 F (35.6 C)   Body mass index is 28.73 kg/m. Physical Exam  GENERAL APPEARANCE: Alert, conversant, No acute distress  SKIN: No diaphoresis rash HEENT: Unremarkable RESPIRATORY: Breathing is even, unlabored. Lung sounds are clear   CARDIOVASCULAR: Heart RRR no murmurs, rubs or gallops. No peripheral edema  GASTROINTESTINAL: Abdomen is soft, non-tender, not distended w/ normal bowel sounds.  GENITOURINARY: Bladder non tender, not distended  MUSCULOSKELETAL: No abnormal joints or musculature NEUROLOGIC: Cranial nerves 2-12 grossly intact. Moves all extremities PSYCHIATRIC: Mood and affect with dementia, no behavioral issues  Patient Active Problem List   Diagnosis Date Noted  . Hypothyroidism 05/04/2016  . Klebsiella pneumoniae sepsis (HCC) 12/01/2015  . Hx pulmonary embolism 12/01/2015  . Palliative care encounter   . Pain in the chest   . Weakness   . Acute on chronic renal failure (HCC) 11/21/2015  . Acute respiratory  failure with hypoxia (HCC) 11/21/2015  . CKD (chronic kidney disease) stage 3, GFR 30-59 ml/min 11/18/2015  . E. coli UTI (urinary tract infection) 08/28/2015  . GERD (gastroesophageal reflux disease) 08/28/2015  . Klebsiella cystitis 08/24/2015  . ARF (acute renal failure) (HCC) 08/24/2015  . Cough 09/24/2014  . Pain in joint, ankle and foot 04/15/2014  . Cellulitis 04/10/2014  . HLD (hyperlipidemia) 12/22/2013  . Renal insufficiency 11/06/2013  . Chest pain 10/26/2013  . Epigastric pain 10/26/2013  . OA (osteoarthritis) 10/03/2013  . Edema 03/03/2013  . Essential hypertension, benign 01/05/2013  . Anemia 01/05/2013  . Glaucoma 06/06/2012  . CAD (coronary artery disease) 06/05/2012    CMP     Component Value Date/Time   NA 140 09/26/2016   K 4.6 09/26/2016   CL 102 11/24/2015 0353   CO2 23 11/24/2015 0353  GLUCOSE 79 11/24/2015 0353   BUN 48 (A) 09/26/2016   CREATININE 1.6 (A) 09/26/2016   CREATININE 1.31 (H) 11/24/2015 0353   CALCIUM 8.6 (L) 11/24/2015 0353   PROT 6.7 11/24/2015 0353   ALBUMIN 2.9 (L) 11/24/2015 0353   AST 21 05/07/2016   ALT 10 05/07/2016   ALKPHOS 78 05/07/2016   BILITOT 0.6 11/24/2015 0353   GFRNONAA 34 (L) 11/24/2015 0353   GFRAA 39 (L) 11/24/2015 0353    Recent Labs  11/21/15 1847 11/23/15 0450 11/24/15 0353  03/18/16 05/07/16 09/26/16  NA 136 136 136  < > 138 140 140  K 4.7 4.3 3.9  < > 4.6 4.8 4.6  CL 98* 103 102  --   --   --   --   CO2 26 26 23   --   --   --   --   GLUCOSE 125* 90 79  --   --   --   --   BUN 32* 31* 20  < > 36* 31* 48*  CREATININE 2.02* 1.77* 1.31*  < > 1.8* 1.5* 1.6*  CALCIUM 9.1 8.6* 8.6*  --   --   --   --   < > = values in this interval not displayed.  Recent Labs  11/24/15 0353 03/05/16 05/07/16  AST 48* 18 21  ALT 22 9 10   ALKPHOS 66 76 78  BILITOT 0.6  --   --   PROT 6.7  --   --   ALBUMIN 2.9*  --   --     Recent Labs  11/21/15 1847 11/23/15 0450 11/24/15 0353  03/12/16 05/07/16 09/26/16    WBC 10.4 5.3 4.5  < > 7.5 7.9 8.5  NEUTROABS 7.2  --   --   --   --   --   --   HGB 12.1 10.7* 11.4*  < > 10.5* 11.1* 11.2*  HCT 39.0 35.2* 36.7  < > 29* 34* 35*  MCV 97.7 97.5 95.6  --   --   --   --   PLT 218 195 199  < > 317 272 286  < > = values in this interval not displayed.  Recent Labs  12/07/15 05/07/16  CHOL 153 192  LDLCALC 81 78  TRIG 210* 383*   No results found for: [facility] Lab Results  Component Value Date   TSH 9.31 (A) 06/13/2016   Lab Results  Component Value Date   HGBA1C 5.4 05/07/2016   Lab Results  Component Value Date   CHOL 192 05/07/2016   HDL 37 05/07/2016   LDLCALC 78 05/07/2016   TRIG 383 (A) 05/07/2016   CHOLHDL 5.2 10/27/2013    Significant Diagnostic Results in last 30 days:  No results found.  Assessment and Plan  Anemia Recent 11.2 which is stable from prior; will monitor at intervals  Hypothyroidism Pt on 50 mcg daily ; will order repeat TSH  Essential hypertension, benign Controlled ; cont lopressor 25 mg BID    Randon Goldsmith. [name], MD

## 2016-11-01 ENCOUNTER — Encounter: Payer: Self-pay | Admitting: Internal Medicine

## 2016-11-01 NOTE — Assessment & Plan Note (Signed)
Controlled ; cont lopressor 25 mg BID

## 2016-11-01 NOTE — Assessment & Plan Note (Signed)
Recent 11.2 which is stable from prior; will monitor at intervals

## 2016-11-01 NOTE — Assessment & Plan Note (Signed)
Pt on 50 mcg daily ; will order repeat TSH

## 2016-11-05 ENCOUNTER — Encounter: Payer: Self-pay | Admitting: Internal Medicine

## 2016-11-05 NOTE — Progress Notes (Signed)
[facility]:  [facility] Room Number: [phone] Place of Service:  SNF [phone]  Randon Goldsmithnne D. Lyn HollingsheadAlexander, MD  Patient Care Team: Kaleen MaskWilson Oliver Elkins, MD as PCP - General [facility](Family Medicine)  Extended Emergency Contact Information Primary Emergency Contact: Hall BusingWicker,Theresa Address: [address]          [place], [place] [id number] [place] Home Phone: [phone] Relation: Daughter Secondary Emergency Contact: Garner,Cathy Address: [phone]          [place] [phone] [place] Home Phone: [phone] Work Phone: [phone] Relation: Daughter    Allergies: Codeine; Morphine and related; and Penicillins  Chief Complaint  Patient presents with  . Acute Visit    HPI: Patient is 83 y.o. female who is being seen acutely because an outbreak of Influenza A per CDC guidelines was recognized on 09/24/2016. Pt has c/o sx of runny nose, cough and fever 99.9 noted today.  Past Medical History:  Diagnosis Date  . AKI (acute kidney injury) (HCC) 07/2015  . CAD (coronary artery disease)    a. 02/2001 PTCA & BMS to the OM1;  b. 08/2001 CBA to ISR in OM1, CBA of mid LAD;  c. 02/2002 Failed PCI of mLAD occlusion, LCX nl, patent OM1 stent, RCA dom, 40p, PDA/PL nl, EF 50% mild antlat HK.  . Cataract    both eyes  . Glaucoma   . Hypertension   . Hypertriglyceridemia   . Osteoarthritis   . Poor circulation   . Thyroid disease    hypothyroidism, hypothyroidism  . UTI (urinary tract infection) 08/24/2015    Past Surgical History:  Procedure Laterality Date  . ABDOMINAL HYSTERECTOMY  1972  . CARDIAC SURGERY  2001   stent placement  . EYE SURGERY  2007/2008   cataracts  . GALLBLADDER SURGERY  2009  . HERNIA REPAIR  2009  . HIP ARTHROPLASTY  06/06/2012   Procedure: ARTHROPLASTY BIPOLAR HIP;  Surgeon: Eldred MangesMark C Yates, MD;  Location: [facility] ORS;  Service: Orthopedics;  Laterality: Right;  monopolar    Allergies as of 09/25/2016      Reactions   Codeine Nausea And Vomiting   Morphine And Related Nausea And Vomiting   Penicillins Hives      Medication List       Accurate as of 09/25/16 11:59 PM. Always use your most recent med list.          aspirin 81 MG chewable tablet Chew 81 mg by mouth daily.   bisacodyl 10 MG suppository Commonly known as:  DULCOLAX Place 10 mg rectally as needed for moderate constipation.   clopidogrel 75 MG tablet Commonly known as:  PLAVIX Take 75 mg by mouth daily.   dorzolamidel-timolol 22.3-6.8 MG/ML Soln ophthalmic solution Commonly known as:  COSOPT Place 1 drop into both eyes every 12 (twelve) hours.   latanoprost 0.005 % ophthalmic solution Commonly known as:  XALATAN Place 1 drop into both eyes at bedtime.   levothyroxine 50 MCG tablet Commonly known as:  SYNTHROID, LEVOTHROID Take 50 mcg by mouth daily before breakfast.   loperamide 2 MG tablet Commonly known as:  IMODIUM A-D Give 2 tablets by mouth (4 mg total) after first loose stool. May give 1 tablet by mouth (2 mg) after each loose stool not to exceed 8 mg by mouth in 24 hours.   magnesium hydroxide 400 MG/5ML suspension Commonly known as:  MILK OF MAGNESIA Take 30 mLs by mouth daily as needed for mild constipation.  metoprolol tartrate 25 MG tablet Commonly known as:  LOPRESSOR Take 25 mg by mouth 2 (two) times daily.   multivitamin with minerals tablet Take 1 tablet by mouth daily.   omeprazole 40 MG capsule Commonly known as:  PRILOSEC Take 40 mg by mouth daily.   ondansetron 4 MG disintegrating tablet Commonly known as:  ZOFRAN ODT Take 1 tablet (4 mg total) by mouth every 8 (eight) hours as needed for nausea or vomiting.   RA SALINE ENEMA 19-7 GM/118ML Enem Place 1 each rectally as needed (for constipation).   traMADol 50 MG tablet Commonly known as:  ULTRAM Take 25 mg by mouth at bedtime as needed.       No orders of the defined types were placed in this encounter.   Immunization History    Administered Date(s) Administered  . Influenza Whole 05/30/2013  . Influenza-Unspecified 06/23/2014, 05/21/2015, 05/24/2016  . PPD Test 10/26/2013  . Pneumococcal-Unspecified 08/23/2013  . Tdap 09/09/2016    Social History  Substance Use Topics  . Smoking status: Never Smoker  . Smokeless tobacco: Never Used  . Alcohol use No    Review of Systems  DATA OBTAINED: from patient, nurse GENERAL:  no fevers SKIN: No itching, rash HEENT: + rhinorrhea, +congestion, no  ST or ear pain RESPIRATORY: + cough, -wheezing, -SOB CARDIAC: No chest pain, palpitations, lower extremity edema  GI: No abdominal pain, No N/V/D or constipation, No heartburn or reflux  MUSCULOSKELETAL: No muscle aches NEUROLOGIC: No headache, dizziness   Vitals:   09/25/16 1546  BP: 130/61  Pulse: (!) 56  Resp: 18  Temp: 97.2 F (36.2 C)   Body mass index is 28.73 kg/m. Physical Exam  GENERAL APPEARANCE: Alert, conversant, No acute distress  SKIN: No diaphoresis rash HEENT: Unremarkable RESPIRATORY: Breathing is even, unlabored. Lung sounds are slt rhonchi  CARDIOVASCULAR: Heart RRR no murmurs, rubs or gallops. No peripheral edema  GASTROINTESTINAL: Abdomen is soft, non-tender, not distended w/ normal bowel sounds.   NEUROLOGIC: Cranial nerves 2-12 grossly intact PSYCHIATRIC: baseline, no mental status changes  Patient Active Problem List   Diagnosis Date Noted  . Hypothyroidism 05/04/2016  . Klebsiella pneumoniae sepsis (HCC) 12/01/2015  . Hx pulmonary embolism 12/01/2015  . Palliative care encounter   . Pain in the chest   . Weakness   . Acute on chronic renal failure (HCC) 11/21/2015  . Acute respiratory failure with hypoxia (HCC) 11/21/2015  . CKD (chronic kidney disease) stage 3, GFR 30-59 ml/min 11/18/2015  . E. coli UTI (urinary tract infection) 08/28/2015  . GERD (gastroesophageal reflux disease) 08/28/2015  . Klebsiella cystitis 08/24/2015  . ARF (acute renal failure) (HCC) 08/24/2015   . Cough 09/24/2014  . Pain in joint, ankle and foot 04/15/2014  . Cellulitis 04/10/2014  . HLD (hyperlipidemia) 12/22/2013  . Renal insufficiency 11/06/2013  . Chest pain 10/26/2013  . Epigastric pain 10/26/2013  . OA (osteoarthritis) 10/03/2013  . Edema 03/03/2013  . Essential hypertension, benign 01/05/2013  . Anemia 01/05/2013  . Glaucoma 06/06/2012  . CAD (coronary artery disease) 06/05/2012    CMP     Component Value Date/Time   NA 140 09/26/2016   K 4.6 09/26/2016   CL 102 11/24/2015 0353   CO2 23 11/24/2015 0353   GLUCOSE 79 11/24/2015 0353   BUN 48 (A) 09/26/2016   CREATININE 1.6 (A) 09/26/2016   CREATININE 1.31 (H) 11/24/2015 0353   CALCIUM 8.6 (L) 11/24/2015 0353   PROT 6.7 11/24/2015 0353   ALBUMIN 2.9 (L)  11/24/2015 0353   AST 21 05/07/2016   ALT 10 05/07/2016   ALKPHOS 78 05/07/2016   BILITOT 0.6 11/24/2015 0353   GFRNONAA 34 (L) 11/24/2015 0353   GFRAA 39 (L) 11/24/2015 0353    Recent Labs  11/21/15 1847 11/23/15 0450 11/24/15 0353  03/18/16 05/07/16 09/26/16  NA 136 136 136  < > 138 140 140  K 4.7 4.3 3.9  < > 4.6 4.8 4.6  CL 98* 103 102  --   --   --   --   CO2 26 26 23   --   --   --   --   GLUCOSE 125* 90 79  --   --   --   --   BUN 32* 31* 20  < > 36* 31* 48*  CREATININE 2.02* 1.77* 1.31*  < > 1.8* 1.5* 1.6*  CALCIUM 9.1 8.6* 8.6*  --   --   --   --   < > = values in this interval not displayed.  Recent Labs  11/24/15 0353 03/05/16 05/07/16  AST 48* 18 21  ALT 22 9 10   ALKPHOS 66 76 78  BILITOT 0.6  --   --   PROT 6.7  --   --   ALBUMIN 2.9*  --   --     Recent Labs  11/21/15 1847 11/23/15 0450 11/24/15 0353  03/12/16 05/07/16 09/26/16  WBC 10.4 5.3 4.5  < > 7.5 7.9 8.5  NEUTROABS 7.2  --   --   --   --   --   --   HGB 12.1 10.7* 11.4*  < > 10.5* 11.1* 11.2*  HCT 39.0 35.2* 36.7  < > 29* 34* 35*  MCV 97.7 97.5 95.6  --   --   --   --   PLT 218 195 199  < > 317 272 286  < > = values in this interval not displayed.  Recent  Labs  12/07/15 05/07/16  CHOL 153 192  LDLCALC 81 78  TRIG 210* 383*   No results found for: [facility] Lab Results  Component Value Date   TSH 9.31 (A) 06/13/2016   Lab Results  Component Value Date   HGBA1C 5.4 05/07/2016   Lab Results  Component Value Date   CHOL 192 05/07/2016   HDL 37 05/07/2016   LDLCALC 78 05/07/2016   TRIG 383 (A) 05/07/2016   CHOLHDL 5.2 10/27/2013    Significant Diagnostic Results in last 30 days:  No results found.  Assessment and Plan  EXPOSURE TO FLU/ INFLUENZA OUTBREAK AT SNF/FLU LIKE SX - have ordered CXR, CBC, BMP and flu test. Per CDC guidelines pt will be covered as if has the flu until flu test is back; will be on Tamiflu 30 mg BID for 5 days based on CrCl calculated by me of 24.3                                                                                Margit Hanks, MD

## 2016-11-12 ENCOUNTER — Emergency Department (HOSPITAL_COMMUNITY)
Admission: EM | Admit: 2016-11-12 | Discharge: 2016-11-12 | Disposition: A | Discharge status: Home / Self Care | Payer: Medicare Other | Attending: Emergency Medicine | Admitting: Emergency Medicine

## 2016-11-12 ENCOUNTER — Encounter (HOSPITAL_COMMUNITY): Payer: Self-pay | Admitting: Emergency Medicine

## 2016-11-12 ENCOUNTER — Emergency Department (HOSPITAL_COMMUNITY): Payer: Medicare Other

## 2016-11-12 DIAGNOSIS — Z96641 Presence of right artificial hip joint: Secondary | ICD-10-CM | POA: Diagnosis not present

## 2016-11-12 DIAGNOSIS — I129 Hypertensive chronic kidney disease with stage 1 through stage 4 chronic kidney disease, or unspecified chronic kidney disease: Secondary | ICD-10-CM | POA: Diagnosis not present

## 2016-11-12 DIAGNOSIS — R930 Abnormal findings on diagnostic imaging of skull and head, not elsewhere classified: Secondary | ICD-10-CM | POA: Insufficient documentation

## 2016-11-12 DIAGNOSIS — S0083XA Contusion of other part of head, initial encounter: Secondary | ICD-10-CM

## 2016-11-12 DIAGNOSIS — I2510 Atherosclerotic heart disease of native coronary artery without angina pectoris: Secondary | ICD-10-CM | POA: Diagnosis not present

## 2016-11-12 DIAGNOSIS — E039 Hypothyroidism, unspecified: Secondary | ICD-10-CM | POA: Diagnosis not present

## 2016-11-12 DIAGNOSIS — W19XXXA Unspecified fall, initial encounter: Secondary | ICD-10-CM | POA: Diagnosis not present

## 2016-11-12 DIAGNOSIS — Y999 Unspecified external cause status: Secondary | ICD-10-CM | POA: Insufficient documentation

## 2016-11-12 DIAGNOSIS — S0990XA Unspecified injury of head, initial encounter: Secondary | ICD-10-CM

## 2016-11-12 DIAGNOSIS — Z7982 Long term (current) use of aspirin: Secondary | ICD-10-CM | POA: Diagnosis not present

## 2016-11-12 DIAGNOSIS — N183 Chronic kidney disease, stage 3 (moderate): Secondary | ICD-10-CM | POA: Diagnosis not present

## 2016-11-12 DIAGNOSIS — Y939 Activity, unspecified: Secondary | ICD-10-CM | POA: Insufficient documentation

## 2016-11-12 DIAGNOSIS — Y929 Unspecified place or not applicable: Secondary | ICD-10-CM | POA: Insufficient documentation

## 2016-11-12 NOTE — ED Notes (Signed)
Report called to [facility].  

## 2016-11-12 NOTE — ED Notes (Signed)
Pt transported to CT ?

## 2016-11-12 NOTE — ED Notes (Signed)
EDP at bedside  

## 2016-11-12 NOTE — ED Provider Notes (Signed)
[facility]-EMERGENCY DEPT Provider Note   CSN: [id number] Arrival date & time:      By signing my name below, I, Soijett Blue, attest that this documentation has been prepared under the direction and in the presence of Gilda Crease, MD. Electronically Signed: Soijett Blue, ED Scribe. 11/12/16. 4:00 AM.  History   Chief Complaint Chief Complaint  Patient presents with  . Fall    HPI [name] [name] is a 83 y.o. female with a PMHx of HTN, who presents to the Emergency Department BIB EMS from [facility] complaining of an unwitnessed fall onset PTA. Pt reports associated HA, contusion to forehead, and subjective leg pain. EMS notes that the pt wasn't given any medications for the relief of her symptoms. Staff informed EMS that the pt was found on the floor and that the pt is at her baseline. Pt denies arm pain, back pain, fever, chills, and any other symptoms. EMS reports that the pt currently takes plavix.    The history is provided by the EMS personnel, the patient and a relative. No language interpreter was used.    Past Medical History:  Diagnosis Date  . AKI (acute kidney injury) (HCC) 07/2015  . CAD (coronary artery disease)    a. 02/2001 PTCA & BMS to the OM1;  b. 08/2001 CBA to ISR in OM1, CBA of mid LAD;  c. 02/2002 Failed PCI of mLAD occlusion, LCX nl, patent OM1 stent, RCA dom, 40p, PDA/PL nl, EF 50% mild antlat HK.  . Cataract    both eyes  . Glaucoma   . Hypertension   . Hypertriglyceridemia   . Osteoarthritis   . Poor circulation   . Thyroid disease    hypothyroidism, hypothyroidism  . UTI (urinary tract infection) 08/24/2015    Patient Active Problem List   Diagnosis Date Noted  . Hypothyroidism 05/04/2016  . Klebsiella pneumoniae sepsis (HCC) 12/01/2015  . Hx pulmonary embolism 12/01/2015  . Palliative care encounter   . Pain in the chest   . Weakness   . Acute on chronic renal failure (HCC) 11/21/2015  . Acute respiratory failure with hypoxia (HCC)  11/21/2015  . CKD (chronic kidney disease) stage 3, GFR 30-59 ml/min 11/18/2015  . E. coli UTI (urinary tract infection) 08/28/2015  . GERD (gastroesophageal reflux disease) 08/28/2015  . Klebsiella cystitis 08/24/2015  . ARF (acute renal failure) (HCC) 08/24/2015  . Cough 09/24/2014  . Pain in joint, ankle and foot 04/15/2014  . Cellulitis 04/10/2014  . HLD (hyperlipidemia) 12/22/2013  . Renal insufficiency 11/06/2013  . Chest pain 10/26/2013  . Epigastric pain 10/26/2013  . OA (osteoarthritis) 10/03/2013  . Edema 03/03/2013  . Essential hypertension, benign 01/05/2013  . Anemia 01/05/2013  . Glaucoma 06/06/2012  . CAD (coronary artery disease) 06/05/2012    Past Surgical History:  Procedure Laterality Date  . ABDOMINAL HYSTERECTOMY  1972  . CARDIAC SURGERY  2001   stent placement  . EYE SURGERY  2007/2008   cataracts  . GALLBLADDER SURGERY  2009  . HERNIA REPAIR  2009  . HIP ARTHROPLASTY  06/06/2012   Procedure: ARTHROPLASTY BIPOLAR HIP;  Surgeon: Eldred Manges, MD;  Location: [facility] ORS;  Service: Orthopedics;  Laterality: Right;  monopolar    OB History    Gravida Para Term Preterm AB Living   5 4     1      SAB TAB Ectopic Multiple Live Births   1  Home Medications    Prior to Admission medications   Medication Sig Start Date End Date Taking? Authorizing Provider  aspirin 81 MG chewable tablet Chew 81 mg by mouth daily.    Historical Provider, MD  bisacodyl (DULCOLAX) 10 MG suppository Place 10 mg rectally as needed for moderate constipation.    Historical Provider, MD  clopidogrel (PLAVIX) 75 MG tablet Take 75 mg by mouth daily.     Historical Provider, MD  Dorzolamide HCl-Timolol Mal PF 22.3-6.8 MG/ML SOLN Place 1 drop into both eyes every 12 (twelve) hours.     Historical Provider, MD  latanoprost (XALATAN) 0.005 % ophthalmic solution Place 1 drop into both eyes at bedtime.    Historical Provider, MD  levothyroxine (SYNTHROID, LEVOTHROID) 50 MCG  tablet Take 50 mcg by mouth daily before breakfast.    Historical Provider, MD  loperamide (IMODIUM A-D) 2 MG tablet Give 2 tablets by mouth (4 mg total) after first loose stool. May give 1 tablet by mouth (2 mg) after each loose stool not to exceed 8 mg by mouth in 24 hours.    Historical Provider, MD  magnesium hydroxide (MILK OF MAGNESIA) 400 MG/5ML suspension Take 30 mLs by mouth daily as needed for mild constipation.    Historical Provider, MD  metoprolol tartrate (LOPRESSOR) 25 MG tablet Take 25 mg by mouth 2 (two) times daily.    Historical Provider, MD  Multiple Vitamins-Minerals (MULTIVITAMIN WITH MINERALS) tablet Take 1 tablet by mouth daily.    Historical Provider, MD  omeprazole (PRILOSEC) 40 MG capsule Take 40 mg by mouth daily.     Historical Provider, MD  ondansetron (ZOFRAN ODT) 4 MG disintegrating tablet Take 1 tablet (4 mg total) by mouth every 8 (eight) hours as needed for nausea or vomiting. 11/24/15   Rhetta Mura, MD  Sodium Phosphates (RA SALINE ENEMA) 19-7 GM/118ML ENEM Place 1 each rectally as needed (for constipation).    Historical Provider, MD  traMADol (ULTRAM) 50 MG tablet Take 25 mg by mouth at bedtime as needed.     Historical Provider, MD    Family History Family History  Problem Relation Age of Onset  . Arthritis Father   . Stroke Father   . Hypertension Father   . Heart disease Sister   . Heart disease Brother   . Hypertension Brother   . Stroke Brother     Social History Social History  Substance Use Topics  . Smoking status: Never Smoker  . Smokeless tobacco: Never Used  . Alcohol use No     Allergies   Codeine; Morphine and related; and Penicillins   Review of Systems Review of Systems  Constitutional: Negative for chills and fever.  HENT:       +contusion to forehead  Musculoskeletal: Positive for arthralgias (subjective leg pain). Negative for back pain.  Neurological: Positive for headaches.  All other systems reviewed and are  negative.    Physical Exam Updated Vital Signs BP 140/71   Pulse 82   Temp 97.8 F (36.6 C) (Oral)   Resp 15   Ht 5\' 2"  (1.575 m)   Wt 160 lb (72.6 kg)   SpO2 (!) 88%   BMI 29.26 kg/m   Physical Exam  Constitutional: She is oriented to person, place, and time. She appears well-developed and well-nourished. No distress.  HENT:  Head: Normocephalic. Head is with contusion.  Right Ear: Hearing normal.  Left Ear: Hearing normal.  Nose: Nose normal.  Mouth/Throat: Oropharynx is clear and moist  and mucous membranes are normal.  Contusion of left forehead.   Eyes: Conjunctivae and EOM are normal. Pupils are equal, round, and reactive to light.  Neck: Normal range of motion. Neck supple.  Cardiovascular: Regular rhythm, S1 normal and S2 normal.  Exam reveals no gallop and no friction rub.   No murmur heard. Pulmonary/Chest: Effort normal and breath sounds normal. No respiratory distress. She exhibits no tenderness.  Abdominal: Soft. Normal appearance and bowel sounds are normal. There is no hepatosplenomegaly. There is no tenderness. There is no rebound, no guarding, no tenderness at McBurney's point and negative Murphy's sign. No hernia.  Musculoskeletal: Normal range of motion.  Neurological: She is alert and oriented to person, place, and time. She has normal strength. No cranial nerve deficit or sensory deficit. Coordination normal. GCS eye subscore is 4. GCS verbal subscore is 5. GCS motor subscore is 6.  Skin: Skin is warm, dry and intact. No rash noted. No cyanosis.  Psychiatric: She has a normal mood and affect. Her speech is normal and behavior is normal. Thought content normal.  Nursing note and vitals reviewed.    ED Treatments / Results  DIAGNOSTIC STUDIES: Oxygen Saturation is 100% on RA, nl by my interpretation.    COORDINATION OF CARE: 3:58 AM Discussed treatment plan with pt at bedside which includes CT cervical spine, CT head, hips bilateral xray, and pt agreed  to plan.   Labs (all labs ordered are listed, but only abnormal results are displayed) Labs Reviewed - No data to display  EKG  EKG Interpretation None       Radiology Ct Head Wo Contrast  Result Date: 11/12/2016 CLINICAL DATA:  83 year old female with fall. EXAM: CT HEAD WITHOUT CONTRAST CT CERVICAL SPINE WITHOUT CONTRAST TECHNIQUE: Multidetector CT imaging of the head and cervical spine was performed following the standard protocol without intravenous contrast. Multiplanar CT image reconstructions of the cervical spine were also generated. COMPARISON:  None. FINDINGS: CT HEAD FINDINGS Brain: There is moderate age-related atrophy and chronic microvascular ischemic changes. There is dilatation of the ventricles out of proportion with the sulci which may represent central volume loss versus normal pressure hydrocephalus. Clinical correlation is recommended. There is no acute intracranial hemorrhage. No mass effect or midline shift noted. No extra-axial fluid collection. Vascular: No hyperdense vessel or unexpected calcification. Skull: Normal. Negative for fracture or focal lesion. Sinuses/Orbits: No acute finding. Other: Large left forehead hematoma. CT CERVICAL SPINE FINDINGS Alignment: No acute subluxation. Grade 1 C3-C4 and C5-C6 anterolisthesis. Skull base and vertebrae: No acute fracture. No primary bone lesion or focal pathologic process. Soft tissues and spinal canal: No prevertebral fluid or swelling. No visible canal hematoma. Disc levels: Degenerative changes. There is multilevel facet hypertrophy most prominent at C3-C4 on the left. Upper chest: Partially visualized atherosclerotic calcification of the aortic arch. No acute findings. Other: None IMPRESSION: 1. No acute intracranial hemorrhage. Moderate age-related atrophy and chronic microvascular ischemic changes. 2. No acute/traumatic cervical spine pathology. Chronic degenerative changes. Electronically Signed   By: Elgie CollardArash  Radparvar  M.D.   On: 11/12/2016 05:18   Ct Cervical Spine Wo Contrast  Result Date: 11/12/2016 CLINICAL DATA:  83 year old female with fall. EXAM: CT HEAD WITHOUT CONTRAST CT CERVICAL SPINE WITHOUT CONTRAST TECHNIQUE: Multidetector CT imaging of the head and cervical spine was performed following the standard protocol without intravenous contrast. Multiplanar CT image reconstructions of the cervical spine were also generated. COMPARISON:  None. FINDINGS: CT HEAD FINDINGS Brain: There is moderate age-related atrophy and  chronic microvascular ischemic changes. There is dilatation of the ventricles out of proportion with the sulci which may represent central volume loss versus normal pressure hydrocephalus. Clinical correlation is recommended. There is no acute intracranial hemorrhage. No mass effect or midline shift noted. No extra-axial fluid collection. Vascular: No hyperdense vessel or unexpected calcification. Skull: Normal. Negative for fracture or focal lesion. Sinuses/Orbits: No acute finding. Other: Large left forehead hematoma. CT CERVICAL SPINE FINDINGS Alignment: No acute subluxation. Grade 1 C3-C4 and C5-C6 anterolisthesis. Skull base and vertebrae: No acute fracture. No primary bone lesion or focal pathologic process. Soft tissues and spinal canal: No prevertebral fluid or swelling. No visible canal hematoma. Disc levels: Degenerative changes. There is multilevel facet hypertrophy most prominent at C3-C4 on the left. Upper chest: Partially visualized atherosclerotic calcification of the aortic arch. No acute findings. Other: None IMPRESSION: 1. No acute intracranial hemorrhage. Moderate age-related atrophy and chronic microvascular ischemic changes. 2. No acute/traumatic cervical spine pathology. Chronic degenerative changes. Electronically Signed   By: Elgie Collard M.D.   On: 11/12/2016 05:18   Dg Hips Bilat W Or Wo Pelvis 3-4 Views  Result Date: 11/12/2016 CLINICAL DATA:  83 year old female with fall.  EXAM: DG HIP (WITH OR WITHOUT PELVIS) 3-4V BILAT COMPARISON:  None. FINDINGS: There is advanced osteopenia which limits evaluation for fracture. There is a right hip hemiarthroplasty which appears intact and in anatomic alignment. No evidence of hardware loosening. There is no acute fracture. No dislocation. There is degenerative changes of the lower lumbar spine. The soft tissues appear unremarkable. Vascular calcification noted. IMPRESSION: 1. No definite acute fracture or dislocation.  Advanced osteopenia. Electronically Signed   By: Elgie Collard M.D.   On: 11/12/2016 05:03    Procedures Procedures (including critical care time)  Medications Ordered in ED Medications - No data to display   Initial Impression / Assessment and Plan / ED Course  I have reviewed the triage vital signs and the nursing notes.  Pertinent labs & imaging results that were available during my care of the patient were reviewed by me and considered in my medical decision making (see chart for details).     Patient brought to the emergency department for evaluation after a fall. Patient has a contusion on her left forehead. She also reportedly complained of some leg pain earlier. Examination reveals that she is moving all extremities without difficulty. She is able to actively and passively range of motion both hips without any elicited pain. X-ray of bilateral hips negative. CT head and cervical spine negative.  Final Clinical Impressions(s) / ED Diagnoses   Final diagnoses:  Injury of head, initial encounter  Contusion of other part of head, initial encounter    New Prescriptions New Prescriptions   No medications on file   I personally performed the services described in this documentation, which was scribed in my presence. The recorded information has been reviewed and is accurate.     Gilda Crease, MD 11/12/16 0630

## 2016-11-12 NOTE — ED Notes (Signed)
Pt removed from LSB with Pollina MD

## 2016-11-12 NOTE — ED Notes (Signed)
ptar at bedside. 

## 2016-11-12 NOTE — ED Triage Notes (Signed)
BIB EMS from [facility], pt had unwitnessed fall, was found on floor. Hematoma noted to L forehead. Pt takes Plavix. Pt is at "baseline" per facility. VSS.

## 2016-11-12 NOTE — ED Notes (Signed)
Pt returned from CT °

## 2016-11-12 NOTE — ED Notes (Signed)
PTAR called  

## 2016-11-15 ENCOUNTER — Encounter: Payer: Self-pay | Admitting: Internal Medicine

## 2016-11-19 ENCOUNTER — Encounter: Payer: Self-pay | Admitting: Internal Medicine

## 2016-11-19 ENCOUNTER — Non-Acute Institutional Stay (SKILLED_NURSING_FACILITY): Payer: Medicare Other | Admitting: Internal Medicine

## 2016-11-19 DIAGNOSIS — H409 Unspecified glaucoma: Secondary | ICD-10-CM

## 2016-11-19 DIAGNOSIS — I2510 Atherosclerotic heart disease of native coronary artery without angina pectoris: Secondary | ICD-10-CM

## 2016-11-19 DIAGNOSIS — F0150 Vascular dementia without behavioral disturbance: Secondary | ICD-10-CM

## 2016-11-19 NOTE — Progress Notes (Signed)
Location:  [facility] Room Number: [phone] Place of Service:  SNF [phone])  Merrilee Seashore, MD  Patient Care Team: Margit Hanks, MD as PCP - General (Internal Medicine)  Extended Emergency Contact Information Primary Emergency Contact: Hall Busing Address: [address]          [place], [place] [address] [place] Home Phone: [phone] Relation: Daughter Secondary Emergency Contact: Garner,Cathy Address: [phone]          [place] [address] [place] Home Phone: [phone] Work Phone: [phone] Relation: Daughter    Allergies: Codeine; Morphine and related; and Penicillins  Chief Complaint  Patient presents with  . Medical Management of Chronic Issues    Routine Visit    HPI: Patient is 83 y.o. female who is being seen for routine issues of CAD, dementia and glaucoma.  Past Medical History:  Diagnosis Date  . AKI (acute kidney injury) (HCC) 07/2015  . Anemia   . Anginal pain (HCC)   . Anxiety   . CAD (coronary artery disease)    a. 02/2001 PTCA & BMS to the OM1;  b. 08/2001 CBA to ISR in [place], CBA of mid LAD;  c. 02/2002 Failed PCI of mLAD occlusion, LCX nl, patent OM1 stent, RCA dom, 40p, PDA/PL nl, EF 50% mild antlat HK.  . Cataract    both eyes  . COPD (chronic obstructive pulmonary disease) (HCC)   . Dementia   . Depression   . Dyspnea   . GERD (gastroesophageal reflux disease)   . Glaucoma   . Hip fracture (HCC)   . Hypertension   . Hypertriglyceridemia   . Hypothyroidism   . Myocardial infarction (HCC)   . Osteoarthritis   . Poor circulation   . Thyroid disease    hypothyroidism, hypothyroidism  . UTI (urinary tract infection) 08/24/2015    Past Surgical History:  Procedure Laterality Date  . ABDOMINAL HYSTERECTOMY  1972  . CARDIAC SURGERY  2001   stent placement  . EYE SURGERY  2007/2008   cataracts  . GALLBLADDER SURGERY  2009  . HERNIA REPAIR  2009  . HIP ARTHROPLASTY   06/06/2012   Procedure: ARTHROPLASTY BIPOLAR HIP;  Surgeon: Eldred Manges, MD;  Location: [facility];  Service: Orthopedics;  Laterality: Right;  monopolar  . HIP ARTHROPLASTY Left 11/28/2016   Procedure: ARTHROPLASTY BIPOLAR HIP (HEMIARTHROPLASTY);  Surgeon: Eldred Manges, MD;  Location: [facility] OR;  Service: Orthopedics;  Laterality: Left;    Allergies as of 11/19/2016      Reactions   Codeine Nausea And Vomiting   Morphine And Related Nausea And Vomiting   Penicillins Hives      Medication List       Accurate as of 11/19/16 11:59 PM. Always use your most recent med list.          aspirin 81 MG chewable tablet Chew 81 mg by mouth daily.   bisacodyl 10 MG suppository Commonly known as:  DULCOLAX Place 10 mg rectally as needed for moderate constipation.   clopidogrel 75 MG tablet Commonly known as:  PLAVIX Take 75 mg by mouth daily.   dorzolamidel-timolol 22.3-6.8 MG/ML Soln ophthalmic solution Commonly known as:  COSOPT Place 1 drop into both eyes every 12 (twelve) hours.   latanoprost 0.005 % ophthalmic solution Commonly known as:  XALATAN Place 1 drop into both eyes at bedtime.   levothyroxine 50 MCG tablet Commonly known as:  SYNTHROID, LEVOTHROID Take 50 mcg  by mouth daily before breakfast.   loperamide 2 MG tablet Commonly known as:  IMODIUM A-D Give 2 tablets by mouth (4 mg total) after first loose stool. May give 1 tablet by mouth (2 mg) after each loose stool not to exceed 8 mg by mouth in 24 hours.   magnesium hydroxide 400 MG/5ML suspension Commonly known as:  MILK OF MAGNESIA Take 30 mLs by mouth daily as needed for mild constipation.   metoprolol tartrate 25 MG tablet Commonly known as:  LOPRESSOR Take 25 mg by mouth 2 (two) times daily.   multivitamin with minerals tablet Take 1 tablet by mouth daily.   omeprazole 40 MG capsule Commonly known as:  PRILOSEC Take 40 mg by mouth daily.   ondansetron 4 MG disintegrating tablet Commonly known as:  ZOFRAN  ODT Take 1 tablet (4 mg total) by mouth every 8 (eight) hours as needed for nausea or vomiting.   RA SALINE ENEMA 19-7 GM/118ML Enem Place 1 each rectally as needed (for constipation).   traMADol 50 MG tablet Commonly known as:  ULTRAM Take 25 mg by mouth at bedtime as needed.       No orders of the defined types were placed in this encounter.   Immunization History  Administered Date(s) Administered  . Influenza Whole 05/30/2013  . Influenza-Unspecified 06/23/2014, 05/21/2015, 05/24/2016  . PPD Test 10/26/2013  . Pneumococcal-Unspecified 08/23/2013  . Tdap 09/09/2016    Social History  Substance Use Topics  . Smoking status: Never Smoker  . Smokeless tobacco: Never Used  . Alcohol use No    Review of Systems  DATA OBTAINED: from patient, nurse GENERAL:  no fevers, fatigue, appetite changes SKIN: No itching, rash HEENT: No complaint RESPIRATORY: No cough, wheezing, SOB CARDIAC: No chest pain, palpitations, lower extremity edema  GI: No abdominal pain, No N/V/D or constipation, No heartburn or reflux  GU: No dysuria, frequency or urgency, or incontinence  MUSCULOSKELETAL: No unrelieved bone/joint pain NEUROLOGIC: No headache, dizziness  PSYCHIATRIC: No overt anxiety or sadness  Vitals:   11/19/16 1307  BP: 99/66  Pulse: 71  Resp: 18  Temp: 97.8 F (36.6 C)   Body mass index is 29.26 kg/m. Physical Exam  GENERAL APPEARANCE: Alert, conversant, No acute distress  SKIN: No diaphoresis rash HEENT: Unremarkable RESPIRATORY: Breathing is even, unlabored. Lung sounds are clear   CARDIOVASCULAR: Heart RRR no murmurs, rubs or gallops. No peripheral edema  GASTROINTESTINAL: Abdomen is soft, non-tender, not distended w/ normal bowel sounds.  GENITOURINARY: Bladder non tender, not distended  MUSCULOSKELETAL: No abnormal joints or musculature NEUROLOGIC: Cranial nerves 2-12 grossly intact. Moves all extremities PSYCHIATRIC: Mood and affect with dementia, no  behavioral issues  Patient Active Problem List   Diagnosis Date Noted  . Femoral neck stress fracture, with routine healing, subsequent encounter 12/07/2016  . S/P hip hemiarthroplasty 12/07/2016  . Hypotension 12/07/2016  . Pressure injury of skin 11/28/2016  . Hip fracture (HCC) 11/27/2016  . Dementia   . Hypothyroidism 05/04/2016  . Klebsiella pneumoniae sepsis (HCC) 12/01/2015  . Hx pulmonary embolism 12/01/2015  . Palliative care encounter   . Pain in the chest   . Weakness   . Acute on chronic renal failure (HCC) 11/21/2015  . Acute respiratory failure with hypoxia (HCC) 11/21/2015  . CKD (chronic kidney disease) stage 3, GFR 30-59 ml/min 11/18/2015  . E. coli UTI (urinary tract infection) 08/28/2015  . GERD (gastroesophageal reflux disease) 08/28/2015  . Klebsiella cystitis 08/24/2015  . ARF (acute renal failure) (  HCC) 08/24/2015  . Cough 09/24/2014  . Pain in joint, ankle and foot 04/15/2014  . Cellulitis 04/10/2014  . HLD (hyperlipidemia) 12/22/2013  . Renal insufficiency 11/06/2013  . Chest pain 10/26/2013  . Epigastric pain 10/26/2013  . OA (osteoarthritis) 10/03/2013  . Edema 03/03/2013  . Essential hypertension, benign 01/05/2013  . Acute blood loss as cause of postoperative anemia 01/05/2013  . Glaucoma 06/06/2012  . Closed left hip fracture (HCC) 06/05/2012  . CAD (coronary artery disease) 06/05/2012  . Hypertension     CMP     Component Value Date/Time   NA 136 12/01/2016 0611   NA 136 (A) 12/01/2016   K 4.2 12/01/2016 0611   CL 102 12/01/2016 0611   CO2 27 12/01/2016 0611   GLUCOSE 96 12/01/2016 0611   BUN 28 (H) 12/01/2016 0611   BUN 28 (A) 12/01/2016   CREATININE 1.32 (H) 12/01/2016 0611   CALCIUM 8.6 (L) 12/01/2016 0611   PROT 6.7 11/27/2016 1200   ALBUMIN 2.8 (L) 11/27/2016 1200   AST 22 11/27/2016 1200   ALT 14 11/27/2016 1200   ALKPHOS 78 11/27/2016 1200   BILITOT 0.3 11/27/2016 1200   GFRNONAA 33 (L) 12/01/2016 0611   GFRAA 38 (L)  12/01/2016 0611    Recent Labs  11/29/16 0225 11/30/16 11/30/16 0354 12/01/16 12/01/16 0611  NA 136 133* 133* 136* 136  K 4.7 3.9 3.9  --  4.2  CL 97*  --  100*  --  102  CO2 26  --  23  --  27  GLUCOSE 214*  --  86  --  96  BUN 30* 38* 38* 28* 28*  CREATININE 1.67* 1.9* 1.89* 1.3* 1.32*  CALCIUM 8.8*  --  8.4*  --  8.6*    Recent Labs  03/05/16 05/07/16 11/27/16 1200  AST 18 21 22   ALT 9 10 14   ALKPHOS 76 78 78  BILITOT  --   --  0.3  PROT  --   --  6.7  ALBUMIN  --   --  2.8*    Recent Labs  11/27/16 1123  11/29/16 0225 11/30/16 11/30/16 0354 12/01/16 12/01/16 0611  WBC 9.6  < > 10.0 9.9 9.9 9.1 9.1  NEUTROABS 6.2  --   --   --   --   --   --   HGB 11.1*  < > 9.5* 8.2* 8.2*  --  8.7*  HCT 35.0*  < > 31.1* 26* 26.2*  --  27.6*  MCV 97.0  < > 97.8  --  97.0  --  97.5  PLT 220  < > 264 226 226  --  268  < > = values in this interval not displayed.  Recent Labs  05/07/16  CHOL 192  LDLCALC 78  TRIG 383*   No results found for: [facility] Lab Results  Component Value Date   TSH 9.31 (A) 06/13/2016   Lab Results  Component Value Date   HGBA1C 5.4 05/07/2016   Lab Results  Component Value Date   CHOL 192 05/07/2016   HDL 37 05/07/2016   LDLCALC 78 05/07/2016   TRIG 383 (A) 05/07/2016   CHOLHDL 5.2 10/27/2013    Significant Diagnostic Results in last 30 days:  Dg Chest 1 View  Result Date: 11/27/2016 CLINICAL DATA:  Hypoxia EXAM: CHEST 1 VIEW COMPARISON:  Diarrhea 16 2018 and August 24, 2015 FINDINGS: There is no edema or consolidation. There is apparent epicardial fat prominence along the lateral heart  border on the left, unchanged. Heart is mildly prominent with pulmonary vascularity within normal limits, stable. There is aortic atherosclerosis. No adenopathy. No bone lesions. IMPRESSION: No edema or consolidation. Prominent epicardial fat on the left laterally. Stable cardiac silhouette. There is aortic atherosclerosis. Electronically Signed   By:  Bretta Bang III M.D.   On: 11/27/2016 11:56   Pelvis Portable  Result Date: 11/28/2016 CLINICAL DATA:  Status post left hip replacement today. The patient suffered an impacted subcapital fracture of the left femoral neck due to a fall 11/27/2016. EXAM: PORTABLE PELVIS 1-2 VIEWS COMPARISON:  CT left hip and plain films left hip 11/27/2016. FINDINGS: New bipolar left hip hemiarthroplasty is identified. The device is located. No fracture is identified. Surgical staples are noted. Old right hip replacement is noted. IMPRESSION: Status post left hip replacement today without evidence of complication. No acute abnormality. Electronically Signed   By: Drusilla Kanner M.D.   On: 11/28/2016 11:36   Ct Hip Left Wo Contrast  Result Date: 11/27/2016 CLINICAL DATA:  Left hip pain status post fall. EXAM: CT OF THE LEFT HIP WITHOUT CONTRAST TECHNIQUE: Multidetector CT imaging of the left hip was performed according to the standard protocol. Multiplanar CT image reconstructions were also generated. COMPARISON:  None. FINDINGS: Bones/Joint/Cartilage Generalized osteopenia. Impacted, nondisplaced subcapital left femoral neck fracture. No other fracture or dislocation. Normal alignment. No joint effusion. No lytic or sclerotic osseous lesion. Ligaments Ligaments are suboptimally evaluated by CT. Muscles and Tendons Generalized muscle atrophy. No intramuscular fluid collection or hematoma. Soft tissue No fluid collection or hematoma. No soft tissue mass. Peripheral vascular atherosclerotic disease. IMPRESSION: 1. Impacted, nondisplaced subcapital left femoral neck fracture. Electronically Signed   By: Elige Ko   On: 11/27/2016 14:05   Dg Hip Unilat With Pelvis 2-3 Views Left  Result Date: 11/27/2016 CLINICAL DATA:  Left hip pain after fall. EXAM: DG HIP (WITH OR WITHOUT PELVIS) 2-3V LEFT COMPARISON:  Radiographs of November 12, 2016. FINDINGS: Status post right hip arthroplasty. No definite dislocation is noted.  Irregularity is seen involving the trochanteric region of the proximal left femur concerning for possible fracture. IMPRESSION: Irregularity seen involving the trochanteric region of proximal left femur concerning for possible fracture. CT or MRI scan is recommended for further evaluation. Electronically Signed   By: Lupita Raider, M.D.   On: 11/27/2016 11:59   Xr Pelvis 1-2 Views  Result Date: 12/08/2016 Stable left partial hip replacement   Assessment and Plan  CAD (coronary artery disease) No c/o CP or equivalent; plan to cont ASA 81 mg daily and metoprolol 25 mg BID  Dementia On no specific meds; cont supportive care   Glaucoma Stable and chronic; cont xalatin and cosopt drops     Anne D. [name], MD

## 2016-11-27 ENCOUNTER — Inpatient Hospital Stay (HOSPITAL_COMMUNITY)
Admission: EM | Admit: 2016-11-27 | Discharge: 2016-12-01 | DRG: 470 | Disposition: A | Discharge status: Skilled Nursing Facility | Payer: Medicare Other | Attending: Internal Medicine | Admitting: Internal Medicine

## 2016-11-27 ENCOUNTER — Emergency Department (HOSPITAL_COMMUNITY): Payer: Medicare Other

## 2016-11-27 ENCOUNTER — Encounter (HOSPITAL_COMMUNITY): Payer: Self-pay | Admitting: Internal Medicine

## 2016-11-27 DIAGNOSIS — E861 Hypovolemia: Secondary | ICD-10-CM | POA: Diagnosis present

## 2016-11-27 DIAGNOSIS — F0390 Unspecified dementia without behavioral disturbance: Secondary | ICD-10-CM | POA: Diagnosis present

## 2016-11-27 DIAGNOSIS — E039 Hypothyroidism, unspecified: Secondary | ICD-10-CM

## 2016-11-27 DIAGNOSIS — Y92129 Unspecified place in nursing home as the place of occurrence of the external cause: Secondary | ICD-10-CM

## 2016-11-27 DIAGNOSIS — E781 Pure hyperglyceridemia: Secondary | ICD-10-CM | POA: Diagnosis present

## 2016-11-27 DIAGNOSIS — R739 Hyperglycemia, unspecified: Secondary | ICD-10-CM | POA: Diagnosis present

## 2016-11-27 DIAGNOSIS — Z7902 Long term (current) use of antithrombotics/antiplatelets: Secondary | ICD-10-CM

## 2016-11-27 DIAGNOSIS — I10 Essential (primary) hypertension: Secondary | ICD-10-CM | POA: Diagnosis present

## 2016-11-27 DIAGNOSIS — I252 Old myocardial infarction: Secondary | ICD-10-CM

## 2016-11-27 DIAGNOSIS — N390 Urinary tract infection, site not specified: Secondary | ICD-10-CM | POA: Diagnosis present

## 2016-11-27 DIAGNOSIS — I739 Peripheral vascular disease, unspecified: Secondary | ICD-10-CM | POA: Diagnosis present

## 2016-11-27 DIAGNOSIS — S72009A Fracture of unspecified part of neck of unspecified femur, initial encounter for closed fracture: Secondary | ICD-10-CM | POA: Diagnosis present

## 2016-11-27 DIAGNOSIS — W19XXXD Unspecified fall, subsequent encounter: Secondary | ICD-10-CM | POA: Diagnosis not present

## 2016-11-27 DIAGNOSIS — E785 Hyperlipidemia, unspecified: Secondary | ICD-10-CM | POA: Diagnosis present

## 2016-11-27 DIAGNOSIS — S72002A Fracture of unspecified part of neck of left femur, initial encounter for closed fracture: Secondary | ICD-10-CM

## 2016-11-27 DIAGNOSIS — Z8249 Family history of ischemic heart disease and other diseases of the circulatory system: Secondary | ICD-10-CM

## 2016-11-27 DIAGNOSIS — I2510 Atherosclerotic heart disease of native coronary artery without angina pectoris: Secondary | ICD-10-CM | POA: Diagnosis present

## 2016-11-27 DIAGNOSIS — Z96641 Presence of right artificial hip joint: Secondary | ICD-10-CM | POA: Diagnosis present

## 2016-11-27 DIAGNOSIS — I129 Hypertensive chronic kidney disease with stage 1 through stage 4 chronic kidney disease, or unspecified chronic kidney disease: Secondary | ICD-10-CM | POA: Diagnosis present

## 2016-11-27 DIAGNOSIS — W19XXXA Unspecified fall, initial encounter: Secondary | ICD-10-CM | POA: Diagnosis present

## 2016-11-27 DIAGNOSIS — D62 Acute posthemorrhagic anemia: Secondary | ICD-10-CM | POA: Diagnosis present

## 2016-11-27 DIAGNOSIS — B9620 Unspecified Escherichia coli [E. coli] as the cause of diseases classified elsewhere: Secondary | ICD-10-CM | POA: Diagnosis present

## 2016-11-27 DIAGNOSIS — Z87440 Personal history of urinary (tract) infections: Secondary | ICD-10-CM

## 2016-11-27 DIAGNOSIS — H409 Unspecified glaucoma: Secondary | ICD-10-CM | POA: Diagnosis present

## 2016-11-27 DIAGNOSIS — K219 Gastro-esophageal reflux disease without esophagitis: Secondary | ICD-10-CM | POA: Diagnosis not present

## 2016-11-27 DIAGNOSIS — N184 Chronic kidney disease, stage 4 (severe): Secondary | ICD-10-CM | POA: Diagnosis present

## 2016-11-27 DIAGNOSIS — N3000 Acute cystitis without hematuria: Secondary | ICD-10-CM | POA: Diagnosis not present

## 2016-11-27 DIAGNOSIS — E46 Unspecified protein-calorie malnutrition: Secondary | ICD-10-CM | POA: Diagnosis present

## 2016-11-27 DIAGNOSIS — Z8261 Family history of arthritis: Secondary | ICD-10-CM

## 2016-11-27 DIAGNOSIS — L8990 Pressure ulcer of unspecified site, unspecified stage: Secondary | ICD-10-CM | POA: Insufficient documentation

## 2016-11-27 DIAGNOSIS — I959 Hypotension, unspecified: Secondary | ICD-10-CM | POA: Diagnosis present

## 2016-11-27 DIAGNOSIS — S72012A Unspecified intracapsular fracture of left femur, initial encounter for closed fracture: Principal | ICD-10-CM | POA: Diagnosis present

## 2016-11-27 DIAGNOSIS — J449 Chronic obstructive pulmonary disease, unspecified: Secondary | ICD-10-CM | POA: Diagnosis present

## 2016-11-27 DIAGNOSIS — Z823 Family history of stroke: Secondary | ICD-10-CM

## 2016-11-27 DIAGNOSIS — Z66 Do not resuscitate: Secondary | ICD-10-CM | POA: Diagnosis present

## 2016-11-27 DIAGNOSIS — M1990 Unspecified osteoarthritis, unspecified site: Secondary | ICD-10-CM | POA: Diagnosis present

## 2016-11-27 DIAGNOSIS — E784 Other hyperlipidemia: Secondary | ICD-10-CM

## 2016-11-27 DIAGNOSIS — Z86711 Personal history of pulmonary embolism: Secondary | ICD-10-CM | POA: Diagnosis present

## 2016-11-27 DIAGNOSIS — H269 Unspecified cataract: Secondary | ICD-10-CM | POA: Diagnosis present

## 2016-11-27 DIAGNOSIS — F0150 Vascular dementia without behavioral disturbance: Secondary | ICD-10-CM | POA: Diagnosis not present

## 2016-11-27 DIAGNOSIS — R270 Ataxia, unspecified: Secondary | ICD-10-CM | POA: Diagnosis present

## 2016-11-27 DIAGNOSIS — S72002D Fracture of unspecified part of neck of left femur, subsequent encounter for closed fracture with routine healing: Secondary | ICD-10-CM | POA: Diagnosis not present

## 2016-11-27 DIAGNOSIS — N179 Acute kidney failure, unspecified: Secondary | ICD-10-CM | POA: Diagnosis not present

## 2016-11-27 DIAGNOSIS — Z79899 Other long term (current) drug therapy: Secondary | ICD-10-CM

## 2016-11-27 DIAGNOSIS — N183 Chronic kidney disease, stage 3 (moderate): Secondary | ICD-10-CM | POA: Diagnosis not present

## 2016-11-27 DIAGNOSIS — Z885 Allergy status to narcotic agent status: Secondary | ICD-10-CM

## 2016-11-27 DIAGNOSIS — Z9889 Other specified postprocedural states: Secondary | ICD-10-CM

## 2016-11-27 DIAGNOSIS — Z880 Allergy status to penicillin: Secondary | ICD-10-CM

## 2016-11-27 DIAGNOSIS — Z955 Presence of coronary angioplasty implant and graft: Secondary | ICD-10-CM

## 2016-11-27 DIAGNOSIS — Z7982 Long term (current) use of aspirin: Secondary | ICD-10-CM

## 2016-11-27 HISTORY — DX: Angina pectoris, unspecified: I20.9

## 2016-11-27 HISTORY — DX: Chronic obstructive pulmonary disease, unspecified: J44.9

## 2016-11-27 HISTORY — DX: Anemia, unspecified: D64.9

## 2016-11-27 HISTORY — DX: Anxiety disorder, unspecified: F41.9

## 2016-11-27 HISTORY — DX: Dyspnea, unspecified: R06.00

## 2016-11-27 HISTORY — DX: Gastro-esophageal reflux disease without esophagitis: K21.9

## 2016-11-27 HISTORY — DX: Acute myocardial infarction, unspecified: I21.9

## 2016-11-27 HISTORY — DX: Depression, unspecified: F32.A

## 2016-11-27 HISTORY — DX: Hypothyroidism, unspecified: E03.9

## 2016-11-27 HISTORY — DX: Fracture of unspecified part of neck of unspecified femur, initial encounter for closed fracture: S72.009A

## 2016-11-27 HISTORY — DX: Major depressive disorder, single episode, unspecified: F32.9

## 2016-11-27 HISTORY — DX: Unspecified dementia, unspecified severity, without behavioral disturbance, psychotic disturbance, mood disturbance, and anxiety: F03.90

## 2016-11-27 LAB — CBC WITH DIFFERENTIAL/PLATELET
Basophils Absolute: 0 10*3/uL (ref 0.0–0.1)
Basophils Relative: 0 %
Eosinophils Absolute: 0.7 10*3/uL (ref 0.0–0.7)
Eosinophils Relative: 7 %
HEMATOCRIT: 35 % — AB (ref 36.0–46.0)
Hemoglobin: 11.1 g/dL — ABNORMAL LOW (ref 12.0–15.0)
Lymphocytes Relative: 16 %
Lymphs Abs: 1.5 10*3/uL (ref 0.7–4.0)
MCH: 30.7 pg (ref 26.0–34.0)
MCHC: 31.7 g/dL (ref 30.0–36.0)
MCV: 97 fL (ref 78.0–100.0)
MONO ABS: 1.2 10*3/uL — AB (ref 0.1–1.0)
MONOS PCT: 12 %
NEUTROS ABS: 6.2 10*3/uL (ref 1.7–7.7)
Neutrophils Relative %: 65 %
Platelets: 220 10*3/uL (ref 150–400)
RBC: 3.61 MIL/uL — ABNORMAL LOW (ref 3.87–5.11)
RDW: 13.2 % (ref 11.5–15.5)
WBC: 9.6 10*3/uL (ref 4.0–10.5)

## 2016-11-27 LAB — COMPREHENSIVE METABOLIC PANEL
ALBUMIN: 2.8 g/dL — AB (ref 3.5–5.0)
ALT: 14 U/L (ref 14–54)
ANION GAP: 10 (ref 5–15)
AST: 22 U/L (ref 15–41)
Alkaline Phosphatase: 78 U/L (ref 38–126)
BILIRUBIN TOTAL: 0.3 mg/dL (ref 0.3–1.2)
BUN: 32 mg/dL — ABNORMAL HIGH (ref 6–20)
CO2: 24 mmol/L (ref 22–32)
Calcium: 9 mg/dL (ref 8.9–10.3)
Chloride: 101 mmol/L (ref 101–111)
Creatinine, Ser: 1.66 mg/dL — ABNORMAL HIGH (ref 0.44–1.00)
GFR, EST AFRICAN AMERICAN: 29 mL/min — AB (ref >60)
GFR, EST NON AFRICAN AMERICAN: 25 mL/min — AB (ref >60)
GLUCOSE: 102 mg/dL — AB (ref 65–99)
POTASSIUM: 3.9 mmol/L (ref 3.5–5.1)
Sodium: 135 mmol/L (ref 135–145)
TOTAL PROTEIN: 6.7 g/dL (ref 6.5–8.1)

## 2016-11-27 LAB — PROTIME-INR
INR: 1.11
PROTHROMBIN TIME: 14.4 s (ref 11.4–15.2)

## 2016-11-27 LAB — BASIC METABOLIC PANEL
BUN: 32 mg/dL — AB (ref 4–21)
CREATININE: 1.7 mg/dL — AB (ref 0.5–1.1)
GLUCOSE: 102 mg/dL
Potassium: 3.9 mmol/L (ref 3.4–5.3)
Sodium: 135 mmol/L — AB (ref 137–147)

## 2016-11-27 LAB — CBC AND DIFFERENTIAL
HCT: 35 % — AB (ref 36–46)
Hemoglobin: 11.1 g/dL — AB (ref 12.0–16.0)
PLATELETS: 220 10*3/uL (ref 150–399)
WBC: 9.6 10*3/mL

## 2016-11-27 LAB — TYPE AND SCREEN
ABO/RH(D): O POS
Antibody Screen: NEGATIVE

## 2016-11-27 LAB — HEPATIC FUNCTION PANEL: Bilirubin, Total: 0.3 mg/dL

## 2016-11-27 MED ORDER — ONDANSETRON 4 MG PO TBDP: 4.0000 mg | ORAL_TABLET | Freq: Three times a day (TID) | ORAL | Status: DC | PRN

## 2016-11-27 MED ORDER — PANTOPRAZOLE SODIUM 40 MG PO TBEC
40.0000 mg | DELAYED_RELEASE_TABLET | Freq: Every day | ORAL | Status: DC
  Administered 2016-11-29 – 2016-12-01 (×3): 40 mg via ORAL
  Filled 2016-11-27 (×3): qty 1

## 2016-11-27 MED ORDER — LEVOTHYROXINE SODIUM 50 MCG PO TABS
50.0000 ug | ORAL_TABLET | Freq: Every day | ORAL | Status: DC
  Administered 2016-11-29 – 2016-12-01 (×3): 50 ug via ORAL
  Filled 2016-11-27 (×3): qty 1

## 2016-11-27 MED ORDER — POVIDONE-IODINE 10 % EX SWAB: 2.0000 "application " | Freq: Once | CUTANEOUS | Status: DC

## 2016-11-27 MED ORDER — METHOCARBAMOL 500 MG PO TABS
500.0000 mg | ORAL_TABLET | Freq: Four times a day (QID) | ORAL | Status: DC | PRN
Start: 2016-11-27 — End: 2016-12-01
  Administered 2016-11-27 – 2016-11-29 (×4): 500 mg via ORAL
  Filled 2016-11-27 (×4): qty 1

## 2016-11-27 MED ORDER — CHLORHEXIDINE GLUCONATE 4 % EX LIQD
60.0000 mL | Freq: Once | CUTANEOUS | Status: AC
  Administered 2016-11-27: 4 via TOPICAL
  Filled 2016-11-27: qty 60

## 2016-11-27 MED ORDER — CEFAZOLIN SODIUM-DEXTROSE 2-4 GM/100ML-% IV SOLN
2.0000 g | INTRAVENOUS | Status: AC
  Administered 2016-11-28: 2 g via INTRAVENOUS
  Filled 2016-11-27 (×2): qty 100

## 2016-11-27 MED ORDER — DORZOLAMIDE HCL-TIMOLOL MAL 2-0.5 % OP SOLN
1.0000 [drp] | Freq: Two times a day (BID) | OPHTHALMIC | Status: DC
Start: 2016-11-27 — End: 2016-12-01
  Administered 2016-11-28 – 2016-12-01 (×7): 1 [drp] via OPHTHALMIC
  Filled 2016-11-27: qty 10

## 2016-11-27 MED ORDER — RA SALINE ENEMA 19-7 GM/118ML RE ENEM: 1.0000 | ENEMA | RECTAL | Status: DC | PRN

## 2016-11-27 MED ORDER — MAGNESIUM HYDROXIDE 400 MG/5ML PO SUSP: 30.0000 mL | Freq: Every day | ORAL | Status: DC | PRN

## 2016-11-27 MED ORDER — FENTANYL CITRATE (PF) 100 MCG/2ML IJ SOLN
50.0000 ug | INTRAMUSCULAR | Status: DC | PRN
  Administered 2016-11-27: 50 ug via INTRAVENOUS
  Filled 2016-11-27: qty 2

## 2016-11-27 MED ORDER — FENTANYL CITRATE (PF) 100 MCG/2ML IJ SOLN: 25.0000 ug | INTRAMUSCULAR | Status: DC | PRN

## 2016-11-27 MED ORDER — HYDROCODONE-ACETAMINOPHEN 5-325 MG PO TABS
1.0000 | ORAL_TABLET | Freq: Four times a day (QID) | ORAL | Status: DC | PRN
  Administered 2016-11-27: 1 via ORAL
  Filled 2016-11-27: qty 1

## 2016-11-27 MED ORDER — BISACODYL 10 MG RE SUPP: 10.0000 mg | RECTAL | Status: DC | PRN

## 2016-11-27 MED ORDER — FLEET ENEMA 7-19 GM/118ML RE ENEM
1.0000 | ENEMA | Freq: Once | RECTAL | Status: DC | PRN
  Filled 2016-11-27: qty 1

## 2016-11-27 MED ORDER — METOPROLOL TARTRATE 25 MG PO TABS
25.0000 mg | ORAL_TABLET | Freq: Two times a day (BID) | ORAL | Status: DC
  Administered 2016-11-28: 25 mg via ORAL
  Administered 2016-11-29: 12.5 mg via ORAL
  Filled 2016-11-27 (×3): qty 1

## 2016-11-27 MED ORDER — ENOXAPARIN SODIUM 30 MG/0.3ML ~~LOC~~ SOLN
30.0000 mg | SUBCUTANEOUS | Status: AC
  Administered 2016-11-28: 30 mg via SUBCUTANEOUS
  Filled 2016-11-27: qty 0.3

## 2016-11-27 MED ORDER — METHOCARBAMOL 1000 MG/10ML IJ SOLN
500.0000 mg | Freq: Four times a day (QID) | INTRAVENOUS | Status: DC | PRN
  Administered 2016-11-28 – 2016-11-29 (×2): 500 mg via INTRAVENOUS
  Filled 2016-11-27 (×4): qty 5

## 2016-11-27 MED ORDER — DOCUSATE SODIUM 100 MG PO CAPS
100.0000 mg | ORAL_CAPSULE | Freq: Two times a day (BID) | ORAL | Status: DC
  Administered 2016-11-28 – 2016-12-01 (×7): 100 mg via ORAL
  Filled 2016-11-27 (×7): qty 1

## 2016-11-27 MED ORDER — LATANOPROST 0.005 % OP SOLN
1.0000 [drp] | Freq: Every day | OPHTHALMIC | Status: DC
  Administered 2016-11-28 – 2016-11-30 (×4): 1 [drp] via OPHTHALMIC
  Filled 2016-11-27: qty 2.5

## 2016-11-27 MED ORDER — FENTANYL CITRATE (PF) 100 MCG/2ML IJ SOLN: 12.5000 ug | INTRAMUSCULAR | Status: DC | PRN

## 2016-11-27 MED ORDER — TRAMADOL HCL 50 MG PO TABS
25.0000 mg | ORAL_TABLET | Freq: Every evening | ORAL | Status: DC | PRN
Start: 2016-11-27 — End: 2016-11-28

## 2016-11-27 MED ORDER — ESCITALOPRAM OXALATE 10 MG PO TABS
5.0000 mg | ORAL_TABLET | Freq: Every day | ORAL | Status: DC
  Administered 2016-11-29 – 2016-12-01 (×3): 5 mg via ORAL
  Filled 2016-11-27 (×3): qty 1

## 2016-11-27 MED ORDER — ONDANSETRON HCL 4 MG/2ML IJ SOLN: 4.0000 mg | Freq: Four times a day (QID) | INTRAMUSCULAR | Status: DC | PRN

## 2016-11-27 MED ORDER — MORPHINE SULFATE (PF) 4 MG/ML IV SOLN: 0.5000 mg | INTRAVENOUS | Status: DC | PRN

## 2016-11-27 MED ORDER — ONDANSETRON HCL 4 MG/2ML IJ SOLN
4.0000 mg | Freq: Four times a day (QID) | INTRAMUSCULAR | Status: DC | PRN
  Administered 2016-11-27: 4 mg via INTRAVENOUS
  Filled 2016-11-27: qty 2

## 2016-11-27 NOTE — ED Notes (Signed)
Dinner tray arrived 

## 2016-11-27 NOTE — ED Notes (Signed)
Back from xray

## 2016-11-27 NOTE — ED Triage Notes (Signed)
Hip pain since Tuesday, no known trauma. Last fall was in early March. X-ray done yesterday out at [place] [facility] where she lives with no acute findings. Hy of hip replacement surgery on the R but pain is on the L today

## 2016-11-27 NOTE — Consult Note (Signed)
Reason for Consult:Left hip pain Referring Physician: Ameliah [name] is an 83 y.o. female.  HPI: [name] [name] fell at the [facility]. She was brought to the ED where her workup was negative. She was discharged back to the SNF. She seemed to do ok for several days but then began to c/o left hip pain and was reluctant to be mobilized. Her family brought her back to the ED. Plain films today showed a possible hip fx and a CT scan confirmed a subcapital femur fx on the left.  Past Medical History:  Diagnosis Date  . AKI (acute kidney injury) ([place]) 07/2015  . CAD (coronary artery disease)    a. 02/2001 PTCA & BMS to the OM1;  b. 08/2001 CBA to ISR in [place], CBA of mid LAD;  c. 02/2002 Failed PCI of mLAD occlusion, LCX nl, patent OM1 stent, RCA dom, 40p, PDA/PL nl, EF 50% mild antlat HK.  . Cataract    both eyes  . Glaucoma   . Hypertension   . Hypertriglyceridemia   . Osteoarthritis   . Poor circulation   . Thyroid disease    hypothyroidism, hypothyroidism  . UTI (urinary tract infection) 08/24/2015    Past Surgical History:  Procedure Laterality Date  . ABDOMINAL HYSTERECTOMY  1972  . CARDIAC SURGERY  2001   stent placement  . EYE SURGERY  2007/2008   cataracts  . GALLBLADDER SURGERY  2009  . HERNIA REPAIR  2009  . HIP ARTHROPLASTY  06/06/2012   Procedure: ARTHROPLASTY BIPOLAR HIP;  Surgeon: Marybelle Killings, MD;  Location: [facility];  Service: Orthopedics;  Laterality: Right;  monopolar    Family History  Problem Relation Age of Onset  . Arthritis Father   . Stroke Father   . Hypertension Father   . Heart disease Sister   . Heart disease Brother   . Hypertension Brother   . Stroke Brother     Social History:  reports that she has never smoked. She has never used smokeless tobacco. She reports that she does not drink alcohol or use drugs.  Allergies:  Allergies  Allergen Reactions  . Codeine Nausea And Vomiting  . Morphine And Related Nausea And Vomiting  .  Penicillins Hives    Medications: I have reviewed the patient's current medications.  Results for orders placed or performed during the hospital encounter of 11/27/16 (from the past 48 hour(s))  Type and screen Ken Caryl     Status: None   Collection Time: 11/27/16 11:18 AM  Result Value Ref Range   ABO/RH(D) O POS    Antibody Screen NEG    Sample Expiration 11/30/2016   CBC WITH DIFFERENTIAL     Status: Abnormal   Collection Time: 11/27/16 11:23 AM  Result Value Ref Range   WBC 9.6 4.0 - 10.5 K/uL   RBC 3.61 (L) 3.87 - 5.11 MIL/uL   Hemoglobin 11.1 (L) 12.0 - 15.0 g/dL   HCT 35.0 (L) 36.0 - 46.0 %   MCV 97.0 78.0 - 100.0 fL   MCH 30.7 26.0 - 34.0 pg   MCHC 31.7 30.0 - 36.0 g/dL   RDW 13.2 11.5 - 15.5 %   Platelets 220 150 - 400 K/uL   Neutrophils Relative % 65 %   Neutro Abs 6.2 1.7 - 7.7 K/uL   Lymphocytes Relative 16 %   Lymphs Abs 1.5 0.7 - 4.0 K/uL   Monocytes Relative 12 %  Monocytes Absolute 1.2 (H) 0.1 - 1.0 K/uL   Eosinophils Relative 7 %   Eosinophils Absolute 0.7 0.0 - 0.7 K/uL   Basophils Relative 0 %   Basophils Absolute 0.0 0.0 - 0.1 K/uL  Protime-INR     Status: None   Collection Time: 11/27/16 11:23 AM  Result Value Ref Range   Prothrombin Time 14.4 11.4 - 15.2 seconds   INR 1.11   Comprehensive metabolic panel     Status: Abnormal   Collection Time: 11/27/16 12:00 PM  Result Value Ref Range   Sodium 135 135 - 145 mmol/L   Potassium 3.9 3.5 - 5.1 mmol/L   Chloride 101 101 - 111 mmol/L   CO2 24 22 - 32 mmol/L   Glucose, Bld 102 (H) 65 - 99 mg/dL   BUN 32 (H) 6 - 20 mg/dL   Creatinine, Ser 1.66 (H) 0.44 - 1.00 mg/dL   Calcium 9.0 8.9 - 10.3 mg/dL   Total Protein 6.7 6.5 - 8.1 g/dL   Albumin 2.8 (L) 3.5 - 5.0 g/dL   AST 22 15 - 41 U/L   ALT 14 14 - 54 U/L   Alkaline Phosphatase 78 38 - 126 U/L   Total Bilirubin 0.3 0.3 - 1.2 mg/dL   GFR calc non Af Amer 25 (L) >60 mL/min   GFR calc Af Amer 29 (L) >60 mL/min    Comment:  (NOTE) The eGFR has been calculated using the CKD EPI equation. This calculation has not been validated in all clinical situations. eGFR's persistently <60 mL/min signify possible Chronic Kidney Disease.    Anion gap 10 5 - 15    Dg Chest 1 View  Result Date: 11/27/2016 CLINICAL DATA:  Hypoxia EXAM: CHEST 1 VIEW COMPARISON:  Diarrhea 16 2018 and August 24, 2015 FINDINGS: There is no edema or consolidation. There is apparent epicardial fat prominence along the lateral heart border on the left, unchanged. Heart is mildly prominent with pulmonary vascularity within normal limits, stable. There is aortic atherosclerosis. No adenopathy. No bone lesions. IMPRESSION: No edema or consolidation. Prominent epicardial fat on the left laterally. Stable cardiac silhouette. There is aortic atherosclerosis. Electronically Signed   By: Lowella Grip III M.D.   On: 11/27/2016 11:56   Ct Hip Left Wo Contrast  Result Date: 11/27/2016 CLINICAL DATA:  Left hip pain status post fall. EXAM: CT OF THE LEFT HIP WITHOUT CONTRAST TECHNIQUE: Multidetector CT imaging of the left hip was performed according to the standard protocol. Multiplanar CT image reconstructions were also generated. COMPARISON:  None. FINDINGS: Bones/Joint/Cartilage Generalized osteopenia. Impacted, nondisplaced subcapital left femoral neck fracture. No other fracture or dislocation. Normal alignment. No joint effusion. No lytic or sclerotic osseous lesion. Ligaments Ligaments are suboptimally evaluated by CT. Muscles and Tendons Generalized muscle atrophy. No intramuscular fluid collection or hematoma. Soft tissue No fluid collection or hematoma. No soft tissue mass. Peripheral vascular atherosclerotic disease. IMPRESSION: 1. Impacted, nondisplaced subcapital left femoral neck fracture. Electronically Signed   By: Kathreen Devoid   On: 11/27/2016 14:05   Dg Hip Unilat With Pelvis 2-3 Views Left  Result Date: 11/27/2016 CLINICAL DATA:  Left hip pain  after fall. EXAM: DG HIP (WITH OR WITHOUT PELVIS) 2-3V LEFT COMPARISON:  Radiographs of November 12, 2016. FINDINGS: Status post right hip arthroplasty. No definite dislocation is noted. Irregularity is seen involving the trochanteric region of the proximal left femur concerning for possible fracture. IMPRESSION: Irregularity seen involving the trochanteric region of proximal left femur concerning for possible  fracture. CT or MRI scan is recommended for further evaluation. Electronically Signed   By: Marijo Conception, M.D.   On: 11/27/2016 11:59    Review of Systems  Constitutional: Negative for weight loss.  HENT: Negative for ear discharge, ear pain, hearing loss and tinnitus.   Eyes: Negative for blurred vision, double vision, photophobia and pain.  Respiratory: Negative for cough, sputum production and shortness of breath.   Cardiovascular: Negative for chest pain.  Gastrointestinal: Negative for abdominal pain, nausea and vomiting.  Genitourinary: Positive for dysuria. Negative for flank pain, frequency and urgency.  Musculoskeletal: Positive for joint pain (Left hip). Negative for back pain, falls, myalgias and neck pain.  Neurological: Negative for dizziness, tingling, sensory change, focal weakness, loss of consciousness and headaches.  Endo/Heme/Allergies: Does not bruise/bleed easily.  Psychiatric/Behavioral: Negative for depression, memory loss and substance abuse. The patient is not nervous/anxious.    Blood pressure (!) 137/50, pulse 63, resp. rate 10, SpO2 98 %. Physical Exam  Constitutional: She appears well-developed and well-nourished. No distress.  HENT:  Head: Normocephalic.  Eyes: Conjunctivae are normal. Right eye exhibits no discharge. Left eye exhibits no discharge. No scleral icterus.  Neck: Neck supple.  Cardiovascular: Normal rate and regular rhythm.   Respiratory: Effort normal. No respiratory distress.  GI: Soft.  Musculoskeletal:  Bilateral shoulder, elbow, wrist,  digits- no skin wounds, nontender, no instability, no blocks to motion  Sens  Ax/R/M/U intact  Mot   Ax/ R/ PIN/ M/ AIN/ U intact  Rad 2+  Pelvis--no traumatic wounds or rash, no ecchymosis, stable to manual stress, nontender  LLE No traumatic wounds, ecchymosis, or rash  TTP left hip  No effusions  Knee stable to varus/ valgus and anterior/posterior stress  Sens DPN, TN intact, SPN paresthetic  Motor EHL, ext, flex, evers 5/5  DP 1+, PT 0, No significant edema   LLE No traumatic wounds, ecchymosis, or rash  Nontender except with knee stress, states usual  No effusions  Knee stable to varus/ valgus and anterior/posterior stress  Sens DPN, SPN, TN intact  Motor EHL, ext, flex, evers 5/5  DP 1+, PT 1+, No significant edema  Lymphadenopathy:    She has no cervical adenopathy.  Neurological: She is alert.  Skin: Skin is warm and dry. She is not diaphoretic.  Psychiatric: She has a normal mood and affect. Her behavior is normal.    Assessment/Plan: Fall (3/21) Left subcapital femur fx -- Will plan for hemiarthroplasty tomorrow by Dr. Lorin Mercy. IM to admit pt. NPO after MN. Ok to start on Lovenox today, please hold tomorrow's dose before surgery. Will order PT/OT to start tomorrow. Multiple medical problems -- per IM    Lisette Abu, PA-C Orthopedic Surgery [phone] 11/27/2016, 2:25 PM

## 2016-11-27 NOTE — ED Notes (Signed)
Updated facility on patient status

## 2016-11-27 NOTE — ED Notes (Signed)
Paged ortho tech 

## 2016-11-27 NOTE — ED Notes (Signed)
Due to patient bed assignment, will not transfer patient to hospital bed until admitted to hospital room.  Will notify ortho and receiving RN.

## 2016-11-27 NOTE — ED Notes (Signed)
Dr. Merrell at bedside. 

## 2016-11-27 NOTE — H&P (Signed)
History and Physical    [name] [name] DOB: Jan 25, 1919 DOA: 11/27/2016  PCP: Merrilee Seashore, MD Patient coming [facility]: [facility]  Chief Complaint: left hip pain  HPI: [name] [name] is a very pleasant 83 y.o. female with medical history significant for CAD, mention, glaucoma, hypertension, osteoarthritis, thyroid disease, presents to the emergency department with chief complaint of persistent left hip pain. Initial evaluation reveals left hip fracture.  Information is obtained from the chart and the daughter who is at the bedside. Daughter reports patient fell at the nursing home where she resides almost 3 weeks ago. At that time she was brought to the emergency department where her workup was negative. She was discharged back to snf, is unclear if she was bearing weight at her baseline level at this point or not. Her baseline is having an unsteady gait needing a cane or walker but refusing to use it leading to recent falls. Report stated before yesterday patient began to complain of left hip and left leg pain. They noticed that she was reluctant to be mobile and bear weight. She was brought back to the emergency department and CT scan confirmed a subcapital femur fracture on the left. Other denies any recent illness nausea vomiting diarrhea constipation. No reports of any abdominal pain nausea vomiting. Daughter does report patient has intermittent urinary tract infections.   ED Course: In the emergency department she's afebrile hemodynamically stable and not hypoxic. She is provided with pain medicine IV fluids. She is evaluated by orthopedics  Review of Systems: As per HPI otherwise 10 point review of systems negative.   Ambulatory Status: baseline is walker with unsteady gait  Past Medical History:  Diagnosis Date  . AKI (acute kidney injury) (HCC) 07/2015  . CAD (coronary artery disease)    a. 02/2001 PTCA & BMS to the OM1;  b. 08/2001 CBA to ISR in OM1, CBA of mid LAD;   c. 02/2002 Failed PCI of mLAD occlusion, LCX nl, patent OM1 stent, RCA dom, 40p, PDA/PL nl, EF 50% mild antlat HK.  . Cataract    both eyes  . Dementia   . Glaucoma   . Hip fracture (HCC)   . Hypertension   . Hypertriglyceridemia   . Osteoarthritis   . Poor circulation   . Thyroid disease    hypothyroidism, hypothyroidism  . UTI (urinary tract infection) 08/24/2015    Past Surgical History:  Procedure Laterality Date  . ABDOMINAL HYSTERECTOMY  1972  . CARDIAC SURGERY  2001   stent placement  . EYE SURGERY  2007/2008   cataracts  . GALLBLADDER SURGERY  2009  . HERNIA REPAIR  2009  . HIP ARTHROPLASTY  06/06/2012   Procedure: ARTHROPLASTY BIPOLAR HIP;  Surgeon: Eldred Manges, MD;  Location: [facility];  Service: Orthopedics;  Laterality: Right;  monopolar    Social History   Social History  . Marital status: Married    Spouse name: N/A  . Number of children: N/A  . Years of education: N/A   Occupational History  . Not on file.   Social History Main Topics  . Smoking status: Never Smoker  . Smokeless tobacco: Never Used  . Alcohol use No  . Drug use: No  . Sexual activity: No   Other Topics Concern  . Not on file   Social History Narrative   Lives @ [facility].    Allergies  Allergen Reactions  . Codeine Nausea And Vomiting  . Morphine And Related Nausea  And Vomiting  . Penicillins Hives    Family History  Problem Relation Age of Onset  . Arthritis Father   . Stroke Father   . Hypertension Father   . Heart disease Sister   . Heart disease Brother   . Hypertension Brother   . Stroke Brother     Prior to Admission medications   Medication Sig Start Date End Date Taking? Authorizing Provider  aspirin 81 MG chewable tablet Chew 81 mg by mouth daily.   Yes Historical Provider, MD  bisacodyl (DULCOLAX) 10 MG suppository Place 10 mg rectally as needed for moderate constipation.   Yes Historical Provider, MD  clopidogrel (PLAVIX) 75 MG tablet Take 75 mg by  mouth daily.    Yes Historical Provider, MD  Dorzolamide HCl-Timolol Mal PF 22.3-6.8 MG/ML SOLN Place 1 drop into both eyes every 12 (twelve) hours.    Yes Historical Provider, MD  escitalopram (LEXAPRO) 5 MG tablet Take 5 mg by mouth daily.   Yes Historical Provider, MD  latanoprost (XALATAN) 0.005 % ophthalmic solution Place 1 drop into both eyes at bedtime.   Yes Historical Provider, MD  levothyroxine (SYNTHROID, LEVOTHROID) 50 MCG tablet Take 50 mcg by mouth daily before breakfast.   Yes Historical Provider, MD  loperamide (IMODIUM A-D) 2 MG tablet Give 2 tablets by mouth (4 mg total) after first loose stool. May give 1 tablet by mouth (2 mg) after each loose stool not to exceed 8 mg by mouth in 24 hours.   Yes Historical Provider, MD  magnesium hydroxide (MILK OF MAGNESIA) 400 MG/5ML suspension Take 30 mLs by mouth daily as needed for mild constipation.   Yes Historical Provider, MD  metoprolol tartrate (LOPRESSOR) 25 MG tablet Take 25 mg by mouth 2 (two) times daily.   Yes Historical Provider, MD  Multiple Vitamins-Minerals (MULTIVITAMIN WITH MINERALS) tablet Take 1 tablet by mouth daily.   Yes Historical Provider, MD  omeprazole (PRILOSEC) 40 MG capsule Take 40 mg by mouth daily.    Yes Historical Provider, MD  ondansetron (ZOFRAN ODT) 4 MG disintegrating tablet Take 1 tablet (4 mg total) by mouth every 8 (eight) hours as needed for nausea or vomiting. 11/24/15  Yes Rhetta Mura, MD  Sodium Phosphates (RA SALINE ENEMA) 19-7 GM/118ML ENEM Place 1 each rectally as needed (for constipation).   Yes Historical Provider, MD  traMADol (ULTRAM) 50 MG tablet Take 25 mg by mouth at bedtime as needed.    Yes Historical Provider, MD    Physical Exam: Vitals:   11/27/16 1130 11/27/16 1518  BP: (!) 137/50 (!) 147/64  Pulse: 63 61  Resp: 10 18  SpO2: 98% 100%     General:  Appears calm and comfortable, pale tan and frail. Large bruise left forehead Eyes:  PERRL, EOMI, normal lids, iris ENT:   grossly normal hearing, lips & tongue, his membranes of her mouth are slightly pale somewhat dry Neck:  no LAD, masses or thyromegaly Cardiovascular:  RRR, no m/r/g. No LE edema.  Respiratory:  CTA bilaterally, no w/r/r. Normal respiratory effort. Abdomen:  soft, ntnd, is a bowel sounds but sluggish. No guarding or rebounding Skin:  no rash or induration seen on limited exam Musculoskeletal:  grossly normal tone BUE/BLE, good ROM, no bony abnormality joints without swelling or erythema left leg externally rotated slightly shorter left hip tender to palpation Psychiatric:  grossly normal mood and affect, speech fluent and appropriate, AOx3 Neurologic:  Patient is alert and oriented to self only. Bilateral grip  5 out of 5. Able to make some needs known  Labs on Admission: I have personally reviewed following labs and imaging studies  CBC:  Recent Labs Lab 11/27/16 1123  WBC 9.6  NEUTROABS 6.2  HGB 11.1*  HCT 35.0*  MCV 97.0  PLT 220   Basic Metabolic Panel:  Recent Labs Lab 11/27/16 1200  NA 135  K 3.9  CL 101  CO2 24  GLUCOSE 102*  BUN 32*  CREATININE 1.66*  CALCIUM 9.0   GFR: Estimated Creatinine Clearance: 18.9 mL/min (A) (by C-G formula based on SCr of 1.66 mg/dL (H)). Liver Function Tests:  Recent Labs Lab 11/27/16 1200  AST 22  ALT 14  ALKPHOS 78  BILITOT 0.3  PROT 6.7  ALBUMIN 2.8*   No results for input(s): LIPASE, AMYLASE in the last 168 hours. No results for input(s): AMMONIA in the last 168 hours. Coagulation Profile:  Recent Labs Lab 11/27/16 1123  INR 1.11   Cardiac Enzymes: No results for input(s): CKTOTAL, CKMB, CKMBINDEX, TROPONINI in the last 168 hours. BNP (last 3 results) No results for input(s): PROBNP in the last 8760 hours. HbA1C: No results for input(s): HGBA1C in the last 72 hours. CBG: No results for input(s): GLUCAP in the last 168 hours. Lipid Profile: No results for input(s): CHOL, HDL, LDLCALC, TRIG, CHOLHDL, LDLDIRECT  in the last 72 hours. Thyroid Function Tests: No results for input(s): TSH, T4TOTAL, FREET4, T3FREE, THYROIDAB in the last 72 hours. Anemia Panel: No results for input(s): VITAMINB12, FOLATE, FERRITIN, TIBC, IRON, RETICCTPCT in the last 72 hours. Urine analysis:    Component Value Date/Time   COLORURINE YELLOW 11/21/2015 2027   APPEARANCEUR HAZY (A) 11/21/2015 2027   LABSPEC 1.015 11/21/2015 2027   PHURINE 5.0 11/21/2015 2027   GLUCOSEU NEGATIVE 11/21/2015 2027   HGBUR MODERATE (A) 11/21/2015 2027   BILIRUBINUR NEGATIVE 11/21/2015 2027   KETONESUR NEGATIVE 11/21/2015 2027   PROTEINUR NEGATIVE 11/21/2015 2027   UROBILINOGEN 0.2 04/11/2014 0111   NITRITE POSITIVE (A) 11/21/2015 2027   LEUKOCYTESUR LARGE (A) 11/21/2015 2027    Creatinine Clearance: Estimated Creatinine Clearance: 18.9 mL/min (A) (by C-G formula based on SCr of 1.66 mg/dL (H)).  Sepsis Labs: (procalcitonin:4,lacticidven:4) )No results found for this or any previous visit (from the past 240 hour(s)).   Radiological Exams on Admission: Dg Chest 1 View  Result Date: 11/27/2016 CLINICAL DATA:  Hypoxia EXAM: CHEST 1 VIEW COMPARISON:  Diarrhea 16 2018 and August 24, 2015 FINDINGS: There is no edema or consolidation. There is apparent epicardial fat prominence along the lateral heart border on the left, unchanged. Heart is mildly prominent with pulmonary vascularity within normal limits, stable. There is aortic atherosclerosis. No adenopathy. No bone lesions. IMPRESSION: No edema or consolidation. Prominent epicardial fat on the left laterally. Stable cardiac silhouette. There is aortic atherosclerosis. Electronically Signed   By: Bretta Bang III M.D.   On: 11/27/2016 11:56   Ct Hip Left Wo Contrast  Result Date: 11/27/2016 CLINICAL DATA:  Left hip pain status post fall. EXAM: CT OF THE LEFT HIP WITHOUT CONTRAST TECHNIQUE: Multidetector CT imaging of the left hip was performed according to the standard  protocol. Multiplanar CT image reconstructions were also generated. COMPARISON:  None. FINDINGS: Bones/Joint/Cartilage Generalized osteopenia. Impacted, nondisplaced subcapital left femoral neck fracture. No other fracture or dislocation. Normal alignment. No joint effusion. No lytic or sclerotic osseous lesion. Ligaments Ligaments are suboptimally evaluated by CT. Muscles and Tendons Generalized muscle atrophy. No intramuscular fluid collection or hematoma. Soft  tissue No fluid collection or hematoma. No soft tissue mass. Peripheral vascular atherosclerotic disease. IMPRESSION: 1. Impacted, nondisplaced subcapital left femoral neck fracture. Electronically Signed   By: Elige Ko   On: 11/27/2016 14:05   Dg Hip Unilat With Pelvis 2-3 Views Left  Result Date: 11/27/2016 CLINICAL DATA:  Left hip pain after fall. EXAM: DG HIP (WITH OR WITHOUT PELVIS) 2-3V LEFT COMPARISON:  Radiographs of November 12, 2016. FINDINGS: Status post right hip arthroplasty. No definite dislocation is noted. Irregularity is seen involving the trochanteric region of the proximal left femur concerning for possible fracture. IMPRESSION: Irregularity seen involving the trochanteric region of proximal left femur concerning for possible fracture. CT or MRI scan is recommended for further evaluation. Electronically Signed   By: Lupita Raider, M.D.   On: 11/27/2016 11:59    EKG: Independently reviewed. Sinus rhythm Prolonged PR interval RBBB and LAFB No significant change since last tracing   Assessment/Plan Principal Problem:   Closed left hip fracture (HCC) Active Problems:   CAD (coronary artery disease)   Essential hypertension, benign   Anemia   OA (osteoarthritis)   HLD (hyperlipidemia)   GERD (gastroesophageal reflux disease)   CKD (chronic kidney disease) stage 3, GFR 30-59 ml/min   Hx pulmonary embolism   Hypothyroidism   Hip fracture (HCC)   Dementia   #1. Left hip fracture status post recent fall x2. CT confirms  as noted above. Evaluated by orthopedics recommend hemiarthropasty tomorrow and Buck's traction -Admit to telemetry -Nothing by mouth past midnight -Pain management -Lovenox today. -Hold home asa and plavix -Foley -follow urinalysis  #2. Chronic kidney disease stage III. Creatinine 1.66. This appears to be close to her baseline. -Gentle IV fluids -Monitor urine output -Nephrotoxins -Recheck in the morning  #3. Hypertension. Fair control in the emergency department. Home medications include metoprolol -We will continue -Monitor  #4. CAD. Status post stent. Patient denies chest pain. EKG with no acute changes. Initial troponin negative. Home medications include Plavix and aspirin -Monitor on telemetry -Hold Plavix and aspirin for now  #5. Glaucoma/cataracts. Family reports patient practically blind in both eyes. -Continue home eyedrops  #6. Dementia. Appears stable at baseline per family -Continue home medications  7. Falls. Patient has a history of unsteady gait. She experienced 2 falls prior to this admission. Has a history of recurrent urinary tract infections as well. On admission she is afebrile no leukocytosis nontoxic appearing -Follow urinalysis -Physical therapy   DVT prophylaxis: sck Code Status: dnr  Family Communication: eldest daughter and HCPOA at bedside   Disposition Plan: back to facility  Consults called: Ophelia Charter ortho Admission status: inpatient   Gwenyth Bender MD [facility]  If 7PM-7AM, please contact night-coverage [email or web address] Password [email or web address]  11/27/2016, 3:25 PM

## 2016-11-27 NOTE — ED Notes (Signed)
Asked secretary to order hospital bed.  Will re-page ortho tech when patient transferred to hospital bed.

## 2016-11-27 NOTE — ED Notes (Signed)
Dinner tray ordered for patient.

## 2016-11-27 NOTE — ED Provider Notes (Signed)
[facility]EMERGENCY DEPT Provider Note   CSN: [id number] Arrival date & time: 11/27/16  1014     History   Chief Complaint Chief Complaint  Patient presents with  . Hip Pain    HPI [name] [name] is a 83 y.o. female.  She presents today with her daughter for evaluation of left hip pain.  Her daughter states that she fell a few weeks ago and was evaluated in the ED.  At the time all imaging was negative.  Since the fall she has been unable to bear weight on her left leg.  Prior to the fall she was able to walk with a walker. She lives at a nursing home and they have not reported any falls to the daughter.  Patient reports she has not fallen since then.  Daughter states that she does not have a diagnosis of dementia that her family has noted mental changes over the past multiple months and reports her mother is at her baseline. Patient indicates that her pain is localized over her left lateral hip and into her groin.  Patient denies any headache, neck pain, abdominal pain, shortness of breath, chest pain.  Patient appears incontinent of urine and is wearing a diaper.        Past Medical History:  Diagnosis Date  . AKI (acute kidney injury) (HCC) 07/2015  . Anemia   . Anginal pain (HCC)   . Anxiety   . CAD (coronary artery disease)    a. 02/2001 PTCA & BMS to the OM1;  b. 08/2001 CBA to ISR in OM1, CBA of mid LAD;  c. 02/2002 Failed PCI of mLAD occlusion, LCX nl, patent OM1 stent, RCA dom, 40p, PDA/PL nl, EF 50% mild antlat HK.  . Cataract    both eyes  . COPD (chronic obstructive pulmonary disease) (HCC)   . Dementia   . Depression   . Dyspnea   . GERD (gastroesophageal reflux disease)   . Glaucoma   . Hip fracture (HCC)   . Hypertension   . Hypertriglyceridemia   . Hypothyroidism   . Myocardial infarction   . Osteoarthritis   . Poor circulation   . Thyroid disease    hypothyroidism, hypothyroidism  . UTI (urinary tract infection) 08/24/2015    Patient Active Problem List   Diagnosis Date Noted  . Hip fracture (HCC) 11/27/2016  . Dementia   . Hypothyroidism 05/04/2016  . Klebsiella pneumoniae sepsis (HCC) 12/01/2015  . Hx pulmonary embolism 12/01/2015  . Palliative care encounter   . Pain in the chest   . Weakness   . Acute on chronic renal failure (HCC) 11/21/2015  . Acute respiratory failure with hypoxia (HCC) 11/21/2015  . CKD (chronic kidney disease) stage 3, GFR 30-59 ml/min 11/18/2015  . E. coli UTI (urinary tract infection) 08/28/2015  . GERD (gastroesophageal reflux disease) 08/28/2015  . Klebsiella cystitis 08/24/2015  . ARF (acute renal failure) (HCC) 08/24/2015  . Cough 09/24/2014  . Pain in joint, ankle and foot 04/15/2014  . Cellulitis 04/10/2014  . HLD (hyperlipidemia) 12/22/2013  . Renal insufficiency 11/06/2013  . Chest pain 10/26/2013  . Epigastric pain 10/26/2013  . OA (osteoarthritis) 10/03/2013  . Edema 03/03/2013  . Essential hypertension, benign 01/05/2013  . Anemia 01/05/2013  . Glaucoma 06/06/2012  . Closed left hip fracture (HCC) 06/05/2012  . CAD (coronary artery disease) 06/05/2012    Past Surgical History:  Procedure Laterality Date  . ABDOMINAL HYSTERECTOMY  1972  . CARDIAC SURGERY  2001   stent  placement  . EYE SURGERY  2007/2008   cataracts  . GALLBLADDER SURGERY  2009  . HERNIA REPAIR  2009  . HIP ARTHROPLASTY  06/06/2012   Procedure: ARTHROPLASTY BIPOLAR HIP;  Surgeon: Eldred Manges, MD;  Location: [facility];  Service: Orthopedics;  Laterality: Right;  monopolar    OB History    Gravida Para Term Preterm AB Living   SAB TAB Ectopic Multiple Live Births   1               Home Medications    Prior to Admission medications   Medication Sig Start Date End Date Taking? Authorizing Provider  aspirin 81 MG chewable tablet Chew 81 mg by mouth daily.   Yes Historical Provider, MD  bisacodyl (DULCOLAX) 10 MG suppository Place 10 mg rectally as needed for moderate constipation.   Yes Historical  Provider, MD  clopidogrel (PLAVIX) 75 MG tablet Take 75 mg by mouth daily.    Yes Historical Provider, MD  Dorzolamide HCl-Timolol Mal PF 22.3-6.8 MG/ML SOLN Place 1 drop into both eyes every 12 (twelve) hours.    Yes Historical Provider, MD  escitalopram (LEXAPRO) 5 MG tablet Take 5 mg by mouth daily.   Yes Historical Provider, MD  latanoprost (XALATAN) 0.005 % ophthalmic solution Place 1 drop into both eyes at bedtime.   Yes Historical Provider, MD  levothyroxine (SYNTHROID, LEVOTHROID) 50 MCG tablet Take 50 mcg by mouth daily before breakfast.   Yes Historical Provider, MD  loperamide (IMODIUM A-D) 2 MG tablet Give 2 tablets by mouth (4 mg total) after first loose stool. May give 1 tablet by mouth (2 mg) after each loose stool not to exceed 8 mg by mouth in 24 hours.   Yes Historical Provider, MD  magnesium hydroxide (MILK OF MAGNESIA) 400 MG/5ML suspension Take 30 mLs by mouth daily as needed for mild constipation.   Yes Historical Provider, MD  metoprolol tartrate (LOPRESSOR) 25 MG tablet Take 25 mg by mouth 2 (two) times daily.   Yes Historical Provider, MD  Multiple Vitamins-Minerals (MULTIVITAMIN WITH MINERALS) tablet Take 1 tablet by mouth daily.   Yes Historical Provider, MD  omeprazole (PRILOSEC) 40 MG capsule Take 40 mg by mouth daily.    Yes Historical Provider, MD  ondansetron (ZOFRAN ODT) 4 MG disintegrating tablet Take 1 tablet (4 mg total) by mouth every 8 (eight) hours as needed for nausea or vomiting. 11/24/15  Yes Rhetta Mura, MD  Sodium Phosphates (RA SALINE ENEMA) 19-7 GM/118ML ENEM Place 1 each rectally as needed (for constipation).   Yes Historical Provider, MD  traMADol (ULTRAM) 50 MG tablet Take 25 mg by mouth at bedtime as needed.    Yes Historical Provider, MD    Family History Family History  Problem Relation Age of Onset  . Arthritis Father   . Stroke Father   . Hypertension Father   . Heart disease Sister   . Heart disease Brother   . Hypertension Brother    . Stroke Brother     Social History Social History  Substance Use Topics  . Smoking status: Never Smoker  . Smokeless tobacco: Never Used  . Alcohol use No     Allergies   Codeine; Morphine and related; and Penicillins   Review of Systems Review of Systems  Unable to perform ROS: Dementia  Constitutional: Negative for fever.  Respiratory: Negative for chest tightness and shortness of breath.   Cardiovascular:  Negative for chest pain.  Gastrointestinal: Negative for abdominal pain, nausea and vomiting.  Genitourinary: Negative for difficulty urinating, dysuria and flank pain.  Musculoskeletal: Positive for arthralgias and gait problem. Negative for neck pain and neck stiffness (Not more stiff than her normal baseline).  Neurological: Negative for numbness and headaches.  Hematological: Bruises/bleeds easily.     Physical Exam Updated Vital Signs BP (!) 154/58 (BP Location: Right Arm)   Pulse 72   Temp 98.6 F (37 C) (Oral)   Resp 18   SpO2 100%   Physical Exam  Constitutional: She appears well-developed and well-nourished. No distress.  HENT:  Head: Normocephalic.  Eyes: Conjunctivae are normal. Pupils are equal, round, and reactive to light. Right eye exhibits no discharge. Left eye exhibits no discharge. No scleral icterus.  Neck: Trachea normal. Neck supple. No spinous process tenderness and no muscular tenderness present. Decreased range of motion (Baseline for patient) present. No tracheal deviation present.  Patient and daughter report that her neck ROM is her normal baseline.  Upon palpation of neck patient reports that it "feels good" and does not hurt.   Cardiovascular: Normal rate, regular rhythm, normal heart sounds and intact distal pulses.  Exam reveals no gallop and no friction rub.   No murmur heard. Pulmonary/Chest: Effort normal and breath sounds normal. No stridor. No respiratory distress.  Abdominal: Soft. Bowel sounds are normal. She exhibits no  distension. There is no tenderness.  Musculoskeletal:       Left hip: She exhibits decreased range of motion, decreased strength and bony tenderness.       Cervical back: She exhibits no tenderness, no bony tenderness and no deformity.  Neurological: She is alert. She has normal strength. No cranial nerve deficit or sensory deficit. She exhibits normal muscle tone.  Skin: Skin is warm and dry.  Psychiatric: She has a normal mood and affect.  Nursing note and vitals reviewed.    ED Treatments / Results  Labs (all labs ordered are listed, but only abnormal results are displayed) Labs Reviewed  CBC WITH DIFFERENTIAL/PLATELET - Abnormal; Notable for the following:       Result Value   RBC 3.61 (*)    Hemoglobin 11.1 (*)    HCT 35.0 (*)    Monocytes Absolute 1.2 (*)    All other components within normal limits  COMPREHENSIVE METABOLIC PANEL - Abnormal; Notable for the following:    Glucose, Bld 102 (*)    BUN 32 (*)    Creatinine, Ser 1.66 (*)    Albumin 2.8 (*)    GFR calc non Af Amer 25 (*)    GFR calc Af Amer 29 (*)    All other components within normal limits  URINALYSIS, ROUTINE W REFLEX MICROSCOPIC - Abnormal; Notable for the following:    APPearance CLOUDY (*)    Hgb urine dipstick MODERATE (*)    Nitrite POSITIVE (*)    Leukocytes, UA LARGE (*)    Bacteria, UA MANY (*)    Squamous Epithelial / LPF 0-5 (*)    All other components within normal limits  APTT - Abnormal; Notable for the following:    aPTT 48 (*)    All other components within normal limits  CBC - Abnormal; Notable for the following:    RBC 3.51 (*)    Hemoglobin 10.9 (*)    HCT 34.7 (*)    All other components within normal limits  BASIC METABOLIC PANEL - Abnormal; Notable for the following:  Chloride 98 (*)    BUN 27 (*)    Creatinine, Ser 1.51 (*)    GFR calc non Af Amer 28 (*)    GFR calc Af Amer 33 (*)    All other components within normal limits  SURGICAL PCR SCREEN  MRSA PCR SCREENING    PROTIME-INR  PROTIME-INR  TYPE AND SCREEN    EKG  EKG Interpretation  Date/Time:  Thursday November 27 2016 11:29:14 EDT Ventricular Rate:  62 PR Interval:    QRS Duration: 134 QT Interval:  427 QTC Calculation: 434 R Axis:   -40 Text Interpretation:  Sinus rhythm Prolonged PR interval RBBB and LAFB No significant change since last tracing Confirmed by KNAPP  MD-J, JON ([id number]) on 11/27/2016 11:44:03 AM       Radiology Dg Chest 1 View  Result Date: 11/27/2016 CLINICAL DATA:  Hypoxia EXAM: CHEST 1 VIEW COMPARISON:  Diarrhea 16 2018 and August 24, 2015 FINDINGS: There is no edema or consolidation. There is apparent epicardial fat prominence along the lateral heart border on the left, unchanged. Heart is mildly prominent with pulmonary vascularity within normal limits, stable. There is aortic atherosclerosis. No adenopathy. No bone lesions. IMPRESSION: No edema or consolidation. Prominent epicardial fat on the left laterally. Stable cardiac silhouette. There is aortic atherosclerosis. Electronically Signed   By: Bretta Bang III M.D.   On: 11/27/2016 11:56   Ct Hip Left Wo Contrast  Result Date: 11/27/2016 CLINICAL DATA:  Left hip pain status post fall. EXAM: CT OF THE LEFT HIP WITHOUT CONTRAST TECHNIQUE: Multidetector CT imaging of the left hip was performed according to the standard protocol. Multiplanar CT image reconstructions were also generated. COMPARISON:  None. FINDINGS: Bones/Joint/Cartilage Generalized osteopenia. Impacted, nondisplaced subcapital left femoral neck fracture. No other fracture or dislocation. Normal alignment. No joint effusion. No lytic or sclerotic osseous lesion. Ligaments Ligaments are suboptimally evaluated by CT. Muscles and Tendons Generalized muscle atrophy. No intramuscular fluid collection or hematoma. Soft tissue No fluid collection or hematoma. No soft tissue mass. Peripheral vascular atherosclerotic disease. IMPRESSION: 1. Impacted, nondisplaced  subcapital left femoral neck fracture. Electronically Signed   By: Elige Ko   On: 11/27/2016 14:05   Dg Hip Unilat With Pelvis 2-3 Views Left  Result Date: 11/27/2016 CLINICAL DATA:  Left hip pain after fall. EXAM: DG HIP (WITH OR WITHOUT PELVIS) 2-3V LEFT COMPARISON:  Radiographs of November 12, 2016. FINDINGS: Status post right hip arthroplasty. No definite dislocation is noted. Irregularity is seen involving the trochanteric region of the proximal left femur concerning for possible fracture. IMPRESSION: Irregularity seen involving the trochanteric region of proximal left femur concerning for possible fracture. CT or MRI scan is recommended for further evaluation. Electronically Signed   By: Lupita Raider, M.D.   On: 11/27/2016 11:59    Procedures Procedures (including critical care time)  Medications Ordered in ED Medications  ondansetron (ZOFRAN) injection 4 mg ( Intravenous MAR Hold 11/28/16 0833)  fentaNYL (SUBLIMAZE) injection 25 mcg ( Intravenous MAR Hold 11/28/16 0833)  povidone-iodine 10 % swab 2 application (2 application Topical Not Given 11/27/16 2306)  escitalopram (LEXAPRO) tablet 5 mg ( Oral Automatically Held 12/13/16 1000)  bisacodyl (DULCOLAX) suppository 10 mg ( Rectal MAR Hold 11/28/16 0833)  magnesium hydroxide (MILK OF MAGNESIA) suspension 30 mL ( Oral MAR Hold 11/28/16 0833)  levothyroxine (SYNTHROID, LEVOTHROID) tablet 50 mcg ( Oral Automatically Held 12/06/16 0800)  pantoprazole (PROTONIX) EC tablet 40 mg ( Oral Automatically Held 12/13/16 1000)  traMADol (ULTRAM)  tablet 25 mg ( Oral MAR Hold 11/28/16 0833)  ondansetron (ZOFRAN-ODT) disintegrating tablet 4 mg ( Oral MAR Hold 11/28/16 0833)  dorzolamide-timolol (COSOPT) 22.3-6.8 MG/ML ophthalmic solution 1 drop ( Both Eyes Automatically Held 12/13/16 2200)  latanoprost (XALATAN) 0.005 % ophthalmic solution 1 drop ( Both Eyes Automatically Held 12/13/16 2200)  metoprolol tartrate (LOPRESSOR) tablet 25 mg ( Oral Automatically Held  12/13/16 2200)  HYDROcodone-acetaminophen (NORCO/VICODIN) 5-325 MG per tablet 1-2 tablet ( Oral MAR Hold 11/28/16 0833)  morphine 4 MG/ML injection 0.52 mg ( Intravenous MAR Hold 11/28/16 0833)  methocarbamol (ROBAXIN) tablet 500 mg ( Oral MAR Hold 11/28/16 1610)    Or  methocarbamol (ROBAXIN) 500 mg in dextrose 5 % 50 mL IVPB ( Intravenous MAR Hold 11/28/16 0833)  docusate sodium (COLACE) capsule 100 mg ( Oral Automatically Held 12/13/16 2200)  sodium phosphate (FLEET) 7-19 GM/118ML enema 1 enema ( Rectal MAR Hold 11/28/16 0833)  lactated ringers infusion ( Intravenous New Bag/Given 11/28/16 0944)  chlorhexidine (HIBICLENS) 4 % liquid 4 application (4 application Topical Given 11/27/16 2305)  ceFAZolin (ANCEF) IVPB 2g/100 mL premix (2 g Intravenous Given 11/28/16 0945)  enoxaparin (LOVENOX) injection 30 mg (30 mg Subcutaneous Given 11/28/16 0008)     Initial Impression / Assessment and Plan / ED Course  I have reviewed the triage vital signs and the nursing notes.  Pertinent labs & imaging results that were available during my care of the patient were reviewed by me and considered in my medical decision making (see chart for details).  1131- Noticed patient dropped her SpO2 stat.  RN reported that she had just administered the 50 mcg fentanyl  prior to this.  She was given supplemental oxygen and rebounded back to 100%.  Will continue to monitor  [name] [name] presented today for evaluation of left hip pain.  She had an x-ray at her nursing facility which was abnormal but not clearly felt to be a fracture. Today she had an x-ray which was non conclusive per the radiologist and a CT was ordered which showed an impacted nondisplaced subcapital left femoral neck fracture.  Orthopedics was consulted.  Hospitalist to admit.   Aside from 1 brief oxygen desaturation that was quickly treated and resolved, patient remained hemodynamically stable while in the ED.  Her blood pressure may be slightly elevated, however I  would attribute that to pain.  Final Clinical Impressions(s) / ED Diagnoses   Final diagnoses:  Fall    New Prescriptions Current Discharge Medication List       Cristina Gong, PA-C 11/28/16 1027    Linwood Dibbles, MD 11/30/16 1209

## 2016-11-27 NOTE — ED Notes (Signed)
Hospital bed delivered  

## 2016-11-27 NOTE — ED Notes (Signed)
Patient's daughter at desk. States patient has been complaining to her about pain for a while, but did not want to complain to RN.  Will provide medications for comfort before dinner tray arrives.

## 2016-11-27 NOTE — Progress Notes (Signed)
Orthopedic Tech Progress Note Patient Details:  [name] [name] 04/29/1919 [id number]  Musculoskeletal Traction Type of Traction: [place]ucks Skin Traction Traction Weight: 5 lbs    Saul Fordyce 11/27/2016, 9:05 PM

## 2016-11-27 NOTE — ED Notes (Signed)
Attempted report x1. 

## 2016-11-27 NOTE — ED Notes (Signed)
PA at bedside.

## 2016-11-27 NOTE — ED Notes (Signed)
Pt spo2 dropped to 70% momentarily after of fentanyl. PT placed on NRB at 15lpm and o2 increased to 100% within a minute. Pt now on 3lpm [place].spo2 100%

## 2016-11-28 ENCOUNTER — Inpatient Hospital Stay (HOSPITAL_COMMUNITY): Payer: Medicare Other

## 2016-11-28 ENCOUNTER — Inpatient Hospital Stay (HOSPITAL_COMMUNITY): Payer: Medicare Other | Admitting: Anesthesiology

## 2016-11-28 ENCOUNTER — Encounter (HOSPITAL_COMMUNITY)
Admission: EM | Disposition: A | Discharge status: Skilled Nursing Facility | Payer: Self-pay | Source: Home / Self Care | Attending: Internal Medicine

## 2016-11-28 ENCOUNTER — Encounter (HOSPITAL_COMMUNITY): Payer: Self-pay | Admitting: Anesthesiology

## 2016-11-28 DIAGNOSIS — W19XXXD Unspecified fall, subsequent encounter: Secondary | ICD-10-CM

## 2016-11-28 DIAGNOSIS — I10 Essential (primary) hypertension: Secondary | ICD-10-CM

## 2016-11-28 DIAGNOSIS — L8990 Pressure ulcer of unspecified site, unspecified stage: Secondary | ICD-10-CM | POA: Insufficient documentation

## 2016-11-28 DIAGNOSIS — N183 Chronic kidney disease, stage 3 (moderate): Secondary | ICD-10-CM

## 2016-11-28 DIAGNOSIS — F0150 Vascular dementia without behavioral disturbance: Secondary | ICD-10-CM

## 2016-11-28 DIAGNOSIS — N3000 Acute cystitis without hematuria: Secondary | ICD-10-CM

## 2016-11-28 DIAGNOSIS — S72002D Fracture of unspecified part of neck of left femur, subsequent encounter for closed fracture with routine healing: Secondary | ICD-10-CM

## 2016-11-28 HISTORY — PX: HIP ARTHROPLASTY: SHX981

## 2016-11-28 LAB — SURGICAL PCR SCREEN
MRSA, PCR: NEGATIVE
STAPHYLOCOCCUS AUREUS: NEGATIVE

## 2016-11-28 LAB — URINALYSIS, ROUTINE W REFLEX MICROSCOPIC
Bilirubin Urine: NEGATIVE
Glucose, UA: NEGATIVE mg/dL
Ketones, ur: NEGATIVE mg/dL
Nitrite: POSITIVE — AB
Protein, ur: NEGATIVE mg/dL
Specific Gravity, Urine: 1.018 (ref 1.005–1.030)
pH: 5 (ref 5.0–8.0)

## 2016-11-28 LAB — CBC AND DIFFERENTIAL
HCT: 35 % — AB (ref 36–46)
HEMATOCRIT: 35 % — AB (ref 36–46)
Hemoglobin: 10.9 g/dL — AB (ref 12.0–16.0)
Hemoglobin: 10.9 g/dL — AB (ref 12.0–16.0)
PLATELETS: 255 10*3/uL (ref 150–399)
Platelets: 240 10*3/uL (ref 150–399)
WBC: 8.9 10^3/mL
WBC: 16.8 10*3/mL

## 2016-11-28 LAB — BASIC METABOLIC PANEL
ANION GAP: 8 (ref 5–15)
BUN: 27 mg/dL — AB (ref 4–21)
BUN: 27 mg/dL — AB (ref 6–20)
CHLORIDE: 98 mmol/L — AB (ref 101–111)
CO2: 32 mmol/L (ref 22–32)
CREATININE: 1.5 mg/dL — AB (ref 0.5–1.1)
Calcium: 9.2 mg/dL (ref 8.9–10.3)
Creatinine, Ser: 1.51 mg/dL — ABNORMAL HIGH (ref 0.44–1.00)
Creatinine: 1.5 mg/dL — AB (ref 0.5–1.1)
GFR calc Af Amer: 33 mL/min — ABNORMAL LOW (ref >60)
GFR, EST NON AFRICAN AMERICAN: 28 mL/min — AB (ref >60)
Glucose, Bld: 95 mg/dL (ref 65–99)
Glucose: 95 mg/dL
POTASSIUM: 4.4 mmol/L (ref 3.5–5.1)
Potassium: 4.4 mmol/L (ref 3.4–5.3)
SODIUM: 138 mmol/L (ref 137–147)
SODIUM: 138 mmol/L (ref 135–145)

## 2016-11-28 LAB — CBC
HCT: 34.7 % — ABNORMAL LOW (ref 36.0–46.0)
HEMATOCRIT: 34.9 % — AB (ref 36.0–46.0)
HEMOGLOBIN: 10.9 g/dL — AB (ref 12.0–15.0)
Hemoglobin: 10.9 g/dL — ABNORMAL LOW (ref 12.0–15.0)
MCH: 31.1 pg (ref 26.0–34.0)
MCH: 30.8 pg (ref 26.0–34.0)
MCHC: 31.4 g/dL (ref 30.0–36.0)
MCHC: 31.2 g/dL (ref 30.0–36.0)
MCV: 98.9 fL (ref 78.0–100.0)
MCV: 98.6 fL (ref 78.0–100.0)
PLATELETS: 240 10*3/uL (ref 150–400)
Platelets: 255 10*3/uL (ref 150–400)
RBC: 3.51 MIL/uL — AB (ref 3.87–5.11)
RBC: 3.54 MIL/uL — AB (ref 3.87–5.11)
RDW: 13.5 % (ref 11.5–15.5)
RDW: 13.1 % (ref 11.5–15.5)
WBC: 8.9 10*3/uL (ref 4.0–10.5)
WBC: 16.8 10*3/uL — AB (ref 4.0–10.5)

## 2016-11-28 LAB — GLUCOSE, CAPILLARY: GLUCOSE-CAPILLARY: 144 mg/dL — AB (ref 65–99)

## 2016-11-28 LAB — PROTIME-INR
INR: 1.15
PROTHROMBIN TIME: 14.8 s (ref 11.4–15.2)

## 2016-11-28 LAB — APTT: APTT: 48 s — AB (ref 24–36)

## 2016-11-28 LAB — CREATININE, SERUM
Creatinine, Ser: 1.48 mg/dL — ABNORMAL HIGH (ref 0.44–1.00)
GFR calc non Af Amer: 29 mL/min — ABNORMAL LOW (ref >60)
GFR, EST AFRICAN AMERICAN: 33 mL/min — AB (ref >60)

## 2016-11-28 SURGERY — HEMIARTHROPLASTY, HIP, DIRECT ANTERIOR APPROACH, FOR FRACTURE
Anesthesia: General | Site: Hip | Laterality: Left

## 2016-11-28 MED ORDER — ROCURONIUM BROMIDE 100 MG/10ML IV SOLN
INTRAVENOUS | Status: DC | PRN
  Administered 2016-11-28: 30 mg via INTRAVENOUS

## 2016-11-28 MED ORDER — DEXTROSE 5 % IV SOLN
INTRAVENOUS | Status: DC | PRN
  Administered 2016-11-28: 10:00:00 via INTRAVENOUS

## 2016-11-28 MED ORDER — HYDROMORPHONE HCL 1 MG/ML IJ SOLN
INTRAMUSCULAR | Status: AC
  Filled 2016-11-28: qty 0.5

## 2016-11-28 MED ORDER — HYDROMORPHONE HCL 1 MG/ML IJ SOLN
0.2500 mg | INTRAMUSCULAR | Status: DC | PRN
  Administered 2016-11-28 (×2): 0.25 mg via INTRAVENOUS
  Administered 2016-11-28: 0.5 mg via INTRAVENOUS

## 2016-11-28 MED ORDER — ENOXAPARIN SODIUM 30 MG/0.3ML ~~LOC~~ SOLN
30.0000 mg | SUBCUTANEOUS | Status: DC
  Administered 2016-11-29: 30 mg via SUBCUTANEOUS
  Filled 2016-11-28: qty 0.3

## 2016-11-28 MED ORDER — HYDROCODONE-ACETAMINOPHEN 5-325 MG PO TABS
1.0000 | ORAL_TABLET | Freq: Four times a day (QID) | ORAL | Status: DC | PRN
Start: 2016-11-28 — End: 2016-12-01
  Administered 2016-11-29 (×2): 2 via ORAL
  Filled 2016-11-28 (×2): qty 2

## 2016-11-28 MED ORDER — ARTIFICIAL TEARS OP OINT
TOPICAL_OINTMENT | OPHTHALMIC | Status: DC | PRN
  Administered 2016-11-28: 1 via OPHTHALMIC

## 2016-11-28 MED ORDER — CEFAZOLIN SODIUM-DEXTROSE 2-4 GM/100ML-% IV SOLN: 2.0000 g | INTRAVENOUS | Status: AC

## 2016-11-28 MED ORDER — SODIUM CHLORIDE 0.45 % IV SOLN
INTRAVENOUS | Status: DC
  Administered 2016-11-28: 13:00:00 via INTRAVENOUS
  Administered 2016-11-29: 1000 mL via INTRAVENOUS

## 2016-11-28 MED ORDER — LIDOCAINE 2% (20 MG/ML) 5 ML SYRINGE
INTRAMUSCULAR | Status: AC
  Filled 2016-11-28: qty 5

## 2016-11-28 MED ORDER — ROCURONIUM BROMIDE 50 MG/5ML IV SOSY
PREFILLED_SYRINGE | INTRAVENOUS | Status: AC
  Filled 2016-11-28: qty 5

## 2016-11-28 MED ORDER — BUPIVACAINE HCL (PF) 0.25 % IJ SOLN
INTRAMUSCULAR | Status: AC
  Filled 2016-11-28: qty 30

## 2016-11-28 MED ORDER — METOCLOPRAMIDE HCL 5 MG PO TABS: 5.0000 mg | ORAL_TABLET | Freq: Three times a day (TID) | ORAL | Status: DC | PRN

## 2016-11-28 MED ORDER — 0.9 % SODIUM CHLORIDE (POUR BTL) OPTIME
TOPICAL | Status: DC | PRN
  Administered 2016-11-28: 1000 mL

## 2016-11-28 MED ORDER — ONDANSETRON HCL 4 MG/2ML IJ SOLN
INTRAMUSCULAR | Status: AC
  Filled 2016-11-28: qty 2

## 2016-11-28 MED ORDER — POVIDONE-IODINE 10 % EX SWAB: 2.0000 "application " | Freq: Once | CUTANEOUS | Status: DC

## 2016-11-28 MED ORDER — ONDANSETRON HCL 4 MG PO TABS: 4.0000 mg | ORAL_TABLET | Freq: Four times a day (QID) | ORAL | Status: DC | PRN

## 2016-11-28 MED ORDER — HYDROMORPHONE HCL 1 MG/ML IJ SOLN
INTRAMUSCULAR | Status: AC
  Administered 2016-11-28: 0.5 mg via INTRAVENOUS
  Filled 2016-11-28: qty 0.5

## 2016-11-28 MED ORDER — LACTATED RINGERS IV SOLN
INTRAVENOUS | Status: DC | PRN
  Administered 2016-11-28: 10:00:00 via INTRAVENOUS

## 2016-11-28 MED ORDER — ACETAMINOPHEN 325 MG PO TABS
650.0000 mg | ORAL_TABLET | Freq: Four times a day (QID) | ORAL | Status: DC | PRN
  Administered 2016-11-29 – 2016-12-01 (×4): 650 mg via ORAL
  Filled 2016-11-28 (×4): qty 2

## 2016-11-28 MED ORDER — FENTANYL CITRATE (PF) 250 MCG/5ML IJ SOLN
INTRAMUSCULAR | Status: AC
  Filled 2016-11-28: qty 5

## 2016-11-28 MED ORDER — LIDOCAINE HCL (CARDIAC) 20 MG/ML IV SOLN
INTRAVENOUS | Status: DC | PRN
  Administered 2016-11-28: 40 mg via INTRAVENOUS

## 2016-11-28 MED ORDER — ONDANSETRON HCL 4 MG/2ML IJ SOLN
INTRAMUSCULAR | Status: DC | PRN
  Administered 2016-11-28: 4 mg via INTRAVENOUS

## 2016-11-28 MED ORDER — BUPIVACAINE HCL (PF) 0.25 % IJ SOLN
INTRAMUSCULAR | Status: DC | PRN
  Administered 2016-11-28: 20 mL

## 2016-11-28 MED ORDER — ONDANSETRON HCL 4 MG/2ML IJ SOLN: 4.0000 mg | Freq: Four times a day (QID) | INTRAMUSCULAR | Status: DC | PRN

## 2016-11-28 MED ORDER — MENTHOL 3 MG MT LOZG: 1.0000 | LOZENGE | OROMUCOSAL | Status: DC | PRN

## 2016-11-28 MED ORDER — PROPOFOL 10 MG/ML IV BOLUS
INTRAVENOUS | Status: AC
  Filled 2016-11-28: qty 20

## 2016-11-28 MED ORDER — ACETAMINOPHEN 650 MG RE SUPP: 650.0000 mg | Freq: Four times a day (QID) | RECTAL | Status: DC | PRN

## 2016-11-28 MED ORDER — SUGAMMADEX SODIUM 200 MG/2ML IV SOLN
INTRAVENOUS | Status: AC
  Filled 2016-11-28: qty 2

## 2016-11-28 MED ORDER — CHLORHEXIDINE GLUCONATE 4 % EX LIQD
60.0000 mL | Freq: Once | CUTANEOUS | Status: DC
  Filled 2016-11-28: qty 60

## 2016-11-28 MED ORDER — ARTIFICIAL TEARS OP OINT
TOPICAL_OINTMENT | OPHTHALMIC | Status: AC
  Filled 2016-11-28: qty 3.5

## 2016-11-28 MED ORDER — PHENOL 1.4 % MT LIQD: 1.0000 | OROMUCOSAL | Status: DC | PRN

## 2016-11-28 MED ORDER — LACTATED RINGERS IV SOLN
INTRAVENOUS | Status: DC
  Administered 2016-11-28: 10:00:00 via INTRAVENOUS

## 2016-11-28 MED ORDER — FENTANYL CITRATE (PF) 100 MCG/2ML IJ SOLN
INTRAMUSCULAR | Status: DC | PRN
  Administered 2016-11-28: 25 ug via INTRAVENOUS
  Administered 2016-11-28: 50 ug via INTRAVENOUS
  Administered 2016-11-28: 25 ug via INTRAVENOUS

## 2016-11-28 MED ORDER — DEXAMETHASONE SODIUM PHOSPHATE 10 MG/ML IJ SOLN
INTRAMUSCULAR | Status: AC
  Filled 2016-11-28: qty 1

## 2016-11-28 MED ORDER — SUGAMMADEX SODIUM 200 MG/2ML IV SOLN
INTRAVENOUS | Status: DC | PRN
  Administered 2016-11-28: 150 mg via INTRAVENOUS

## 2016-11-28 MED ORDER — DEXAMETHASONE SODIUM PHOSPHATE 10 MG/ML IJ SOLN
INTRAMUSCULAR | Status: DC | PRN
  Administered 2016-11-28: 5 mg via INTRAVENOUS

## 2016-11-28 MED ORDER — METOCLOPRAMIDE HCL 5 MG/ML IJ SOLN: 5.0000 mg | Freq: Three times a day (TID) | INTRAMUSCULAR | Status: DC | PRN

## 2016-11-28 MED ORDER — PHENYLEPHRINE HCL 10 MG/ML IJ SOLN
INTRAVENOUS | Status: DC | PRN
  Administered 2016-11-28: 45 ug/min via INTRAVENOUS

## 2016-11-28 MED ORDER — CEFTRIAXONE SODIUM 1 G IJ SOLR
1.0000 g | INTRAMUSCULAR | Status: DC
  Administered 2016-11-28 – 2016-11-30 (×3): 1 g via INTRAVENOUS
  Filled 2016-11-28 (×3): qty 10

## 2016-11-28 MED ORDER — PROPOFOL 10 MG/ML IV BOLUS
INTRAVENOUS | Status: DC | PRN
  Administered 2016-11-28: 80 mg via INTRAVENOUS

## 2016-11-28 MED ORDER — PROMETHAZINE HCL 25 MG/ML IJ SOLN: 6.2500 mg | INTRAMUSCULAR | Status: DC | PRN

## 2016-11-28 SURGICAL SUPPLY — 49 items
BLADE SAW SAG 73X25 THK (BLADE) ×2
BLADE SAW SGTL 73X25 THK (BLADE) ×1 IMPLANT
CAPT HIP HEMI 1 ×2 IMPLANT
DRAPE IMP U-DRAPE 54X76 (DRAPES) ×3 IMPLANT
DRAPE INCISE IOBAN 66X45 STRL (DRAPES) ×4 IMPLANT
DRAPE ORTHO SPLIT 77X108 STRL (DRAPES) ×3
DRAPE SURG ORHT 6 SPLT 77X108 (DRAPES) ×2 IMPLANT
DRAPE U-SHAPE 47X51 STRL (DRAPES) ×3 IMPLANT
DRSG AQUACEL AG ADV 3.5X14 (GAUZE/BANDAGES/DRESSINGS) ×2 IMPLANT
DURAPREP 26ML APPLICATOR (WOUND CARE) ×3 IMPLANT
ELECT CAUTERY BLADE 6.4 (BLADE) ×3 IMPLANT
ELECT REM PT RETURN 9FT ADLT (ELECTROSURGICAL) ×3
ELECTRODE REM PT RTRN 9FT ADLT (ELECTROSURGICAL) ×1 IMPLANT
FACESHIELD WRAPAROUND (MASK) ×6 IMPLANT
FACESHIELD WRAPAROUND OR TEAM (MASK) ×2 IMPLANT
GLOVE BIOGEL PI IND STRL 7.0 (GLOVE) IMPLANT
GLOVE BIOGEL PI IND STRL 8 (GLOVE) ×2 IMPLANT
GLOVE BIOGEL PI INDICATOR 7.0 (GLOVE) ×4
GLOVE BIOGEL PI INDICATOR 8 (GLOVE) ×2
GLOVE INDICATOR 7.5 STRL GRN (GLOVE) ×2 IMPLANT
GLOVE ORTHO TXT STRL SZ7.5 (GLOVE) ×4 IMPLANT
GLOVE SURG SS PI 6.5 STRL IVOR (GLOVE) ×2 IMPLANT
GLOVE SURG SS PI 7.0 STRL IVOR (GLOVE) ×2 IMPLANT
GOWN STRL REUS W/ TWL LRG LVL3 (GOWN DISPOSABLE) ×1 IMPLANT
GOWN STRL REUS W/ TWL XL LVL3 (GOWN DISPOSABLE) ×1 IMPLANT
GOWN STRL REUS W/TWL 2XL LVL3 (GOWN DISPOSABLE) ×3 IMPLANT
GOWN STRL REUS W/TWL LRG LVL3 (GOWN DISPOSABLE) ×6
GOWN STRL REUS W/TWL XL LVL3 (GOWN DISPOSABLE) ×3
IMMOBILIZER KNEE 20 (SOFTGOODS) ×3
IMMOBILIZER KNEE 20 THIGH 36 (SOFTGOODS) ×1 IMPLANT
KIT BASIN OR (CUSTOM PROCEDURE TRAY) ×3 IMPLANT
KIT ROOM TURNOVER OR (KITS) ×3 IMPLANT
NDL HYPO 25GX1X1/2 BEV (NEEDLE) ×1 IMPLANT
NEEDLE HYPO 25GX1X1/2 BEV (NEEDLE) ×3 IMPLANT
NS IRRIG 1000ML POUR BTL (IV SOLUTION) ×3 IMPLANT
PACK TOTAL JOINT (CUSTOM PROCEDURE TRAY) ×3 IMPLANT
PAD ARMBOARD 7.5X6 YLW CONV (MISCELLANEOUS) ×6 IMPLANT
SPONGE LAP 4X18 X RAY DECT (DISPOSABLE) ×4 IMPLANT
STAPLER VISISTAT 35W (STAPLE) ×3 IMPLANT
SUCTION FRAZIER HANDLE 10FR (MISCELLANEOUS) ×2
SUCTION TUBE FRAZIER 10FR DISP (MISCELLANEOUS) ×1 IMPLANT
SUT ETHIBOND NAB CT1 #1 30IN (SUTURE) ×7 IMPLANT
SUT TICRON (SUTURE) ×4 IMPLANT
SUT VIC AB 0 CT2 27 (SUTURE) ×2 IMPLANT
SUT VIC AB 2-0 CT1 27 (SUTURE) ×3
SUT VIC AB 2-0 CT1 TAPERPNT 27 (SUTURE) ×3 IMPLANT
SYR CONTROL 10ML LL (SYRINGE) ×3 IMPLANT
TOWEL OR 17X26 10 PK STRL BLUE (TOWEL DISPOSABLE) ×3 IMPLANT
WATER STERILE IRR 1000ML POUR (IV SOLUTION) ×6 IMPLANT

## 2016-11-28 NOTE — Progress Notes (Signed)
OT Cancellation Note  Patient Details Name: [name] [name] MRN: [id number] DOB: 12/06/1918   Cancelled Treatment:    Reason Eval/Treat Not Completed: Patient not medically ready; patient is off unit for hemiarthroplasty today.  Will await fixation prior to starting OT.  Rosalio Loud, [phone] 11/28/2016 11:41 AM

## 2016-11-28 NOTE — Anesthesia Procedure Notes (Signed)
Procedure Name: Intubation Date/Time: 11/28/2016 9:53 AM Performed by: Jacquiline Doe A Pre-anesthesia Checklist: Patient identified, Emergency Drugs available, Suction available and Patient being monitored Patient Re-evaluated:Patient Re-evaluated prior to inductionOxygen Delivery Method: Circle System Utilized and Circle system utilized Preoxygenation: Pre-oxygenation with 100% oxygen Intubation Type: IV induction and Cricoid Pressure applied Ventilation: Mask ventilation without difficulty Laryngoscope Size: Mac and 3 Grade View: Grade I Tube type: Oral Tube size: 7.5 mm Number of attempts: 1 Airway Equipment and Method: Stylet and Oral airway Placement Confirmation: ETT inserted through vocal cords under direct vision,  positive ETCO2 and breath sounds checked- equal and bilateral Secured at: 20 cm Tube secured with: Tape Dental Injury: Teeth and Oropharynx as per pre-operative assessment

## 2016-11-28 NOTE — Consult Note (Signed)
Reason for Consult: Left displaced femoral neck fracture Referring Physician: A. Hongalgi MD  [name] [name] is an 83 y.o. female.  HPI: 83 year old female minimal ambulator with a walker has been in a skilled facility and had a fall with gradual increased left hip pain. X-ray was obtained in the ER due to continued left hip pain. Patient is well known to me from previous right hip hemiarthroplasty for femoral neck fracture in 2013. Patient is poor historian. She has daughters at the bedside and aided in history. Pain is moderate in the left hip rating down to the knee. Worse with hip motion. She's not able to walk. Patient had plain radiographs and also CT scan of the emergency room which have been reviewed with family members. She has complex history with the previous acute kidney injury, coronary artery disease post angioplasties. Other medical problems include history of UTIs, hypertension, glaucoma, decreased mental acuity, peripheral arterial disease. Patient has malnutrition with low albumin of 2.8. She also has anemia and high triglycerides. Family members had requested she be transferred to [facility] surgical treatment by me for her displaced femoral neck fracture on the left. In the skilled facility she was a minimal ambulator with a walker.   Review of systems: Due to patient being a poor historian her review of systems obtained primarily from chart review as well as discussion with family members as well as review of notes from emergency room, primary care providers and orthopedic PA note.  Patient has dementia previous right hip fracture history of pneumonia. Acute on chronic renal failure. History of hypoxia March 2017. Escherichia coli and Klebsiella UTIs. Hyperlipidemia history of chest pain previous angioplasty. Knee osteoarthritis mild lower extremity edema chronic anemia. Glaucoma. Previous cataract surgery cardiac stent placement gallbladder surgery hernia surgery and right hip  hemiarthroplasty 06/06/2012 for displaced femoral neck fracture. On the right hip she has #4 stem +0 neck and 45 mm ball.  Past Medical History:  Diagnosis Date  . AKI (acute kidney injury) ([place]) 07/2015  . Anemia   . Anginal pain ([place])   . Anxiety   . CAD (coronary artery disease)    a. 02/2001 PTCA & BMS to the OM1;  b. 08/2001 CBA to ISR in [place], CBA of mid LAD;  c. 02/2002 Failed PCI of mLAD occlusion, LCX nl, patent OM1 stent, RCA dom, 40p, PDA/PL nl, EF 50% mild antlat HK.  . Cataract    both eyes  . COPD (chronic obstructive pulmonary disease) ([place])   . Dementia   . Depression   . Dyspnea   . GERD (gastroesophageal reflux disease)   . Glaucoma   . Hip fracture ([place])   . Hypertension   . Hypertriglyceridemia   . Hypothyroidism   . Myocardial infarction   . Osteoarthritis   . Poor circulation   . Thyroid disease    hypothyroidism, hypothyroidism  . UTI (urinary tract infection) 08/24/2015    Past Surgical History:  Procedure Laterality Date  . ABDOMINAL HYSTERECTOMY  1972  . CARDIAC SURGERY  2001   stent placement  . EYE SURGERY  2007/2008   cataracts  . GALLBLADDER SURGERY  2009  . HERNIA REPAIR  2009  . HIP ARTHROPLASTY  06/06/2012   Procedure: ARTHROPLASTY BIPOLAR HIP;  Surgeon: Marybelle Killings, MD;  Location: [facility];  Service: Orthopedics;  Laterality: Right;  monopolar    Family History  Problem Relation Age of Onset  . Arthritis Father   . Stroke Father   .  Hypertension Father   . Heart disease Sister   . Heart disease Brother   . Hypertension Brother   . Stroke Brother     Social History:  reports that she has never smoked. She has never used smokeless tobacco. She reports that she does not drink alcohol or use drugs.  Allergies:  Allergies  Allergen Reactions  . Codeine Nausea And Vomiting  . Morphine And Related Nausea And Vomiting  . Penicillins Hives    Medications: I have reviewed the patient's current medications.  Results for orders placed  or performed during the hospital encounter of 11/27/16 (from the past 48 hour(s))  Urinalysis, Routine w reflex microscopic     Status: Abnormal   Collection Time: 11/27/16  6:19 AM  Result Value Ref Range   Color, Urine YELLOW YELLOW   APPearance CLOUDY (A) CLEAR   Specific Gravity, Urine 1.018 1.005 - 1.030   pH 5.0 5.0 - 8.0   Glucose, UA NEGATIVE NEGATIVE mg/dL   Hgb urine dipstick MODERATE (A) NEGATIVE   Bilirubin Urine NEGATIVE NEGATIVE   Ketones, ur NEGATIVE NEGATIVE mg/dL   Protein, ur NEGATIVE NEGATIVE mg/dL   Nitrite POSITIVE (A) NEGATIVE   Leukocytes, UA LARGE (A) NEGATIVE   RBC / HPF TOO NUMEROUS TO COUNT 0 - 5 RBC/hpf   WBC, UA TOO NUMEROUS TO COUNT 0 - 5 WBC/hpf   Bacteria, UA MANY (A) NONE SEEN   Squamous Epithelial / LPF 0-5 (A) NONE SEEN   WBC Clumps PRESENT    Mucous PRESENT    Hyaline Casts, UA PRESENT    Amorphous Crystal PRESENT   Type and screen [place]     Status: None   Collection Time: 11/27/16 11:18 AM  Result Value Ref Range   ABO/RH(D) O POS    Antibody Screen NEG    Sample Expiration 11/30/2016   CBC WITH DIFFERENTIAL     Status: Abnormal   Collection Time: 11/27/16 11:23 AM  Result Value Ref Range   WBC 9.6 4.0 - 10.5 K/uL   RBC 3.61 (L) 3.87 - 5.11 MIL/uL   Hemoglobin 11.1 (L) 12.0 - 15.0 g/dL   HCT 35.0 (L) 36.0 - 46.0 %   MCV 97.0 78.0 - 100.0 fL   MCH 30.7 26.0 - 34.0 pg   MCHC 31.7 30.0 - 36.0 g/dL   RDW 13.2 11.5 - 15.5 %   Platelets 220 150 - 400 K/uL   Neutrophils Relative % 65 %   Neutro Abs 6.2 1.7 - 7.7 K/uL   Lymphocytes Relative 16 %   Lymphs Abs 1.5 0.7 - 4.0 K/uL   Monocytes Relative 12 %   Monocytes Absolute 1.2 (H) 0.1 - 1.0 K/uL   Eosinophils Relative 7 %   Eosinophils Absolute 0.7 0.0 - 0.7 K/uL   Basophils Relative 0 %   Basophils Absolute 0.0 0.0 - 0.1 K/uL  Protime-INR     Status: None   Collection Time: 11/27/16 11:23 AM  Result Value Ref Range   Prothrombin Time 14.4 11.4 - 15.2 seconds    INR 1.11   Comprehensive metabolic panel     Status: Abnormal   Collection Time: 11/27/16 12:00 PM  Result Value Ref Range   Sodium 135 135 - 145 mmol/L   Potassium 3.9 3.5 - 5.1 mmol/L   Chloride 101 101 - 111 mmol/L   CO2 24 22 - 32 mmol/L   Glucose, Bld 102 (H) 65 - 99 mg/dL   BUN 32 (H) 6 -  20 mg/dL   Creatinine, Ser 1.66 (H) 0.44 - 1.00 mg/dL   Calcium 9.0 8.9 - 10.3 mg/dL   Total Protein 6.7 6.5 - 8.1 g/dL   Albumin 2.8 (L) 3.5 - 5.0 g/dL   AST 22 15 - 41 U/L   ALT 14 14 - 54 U/L   Alkaline Phosphatase 78 38 - 126 U/L   Total Bilirubin 0.3 0.3 - 1.2 mg/dL   GFR calc non Af Amer 25 (L) >60 mL/min   GFR calc Af Amer 29 (L) >60 mL/min    Comment: (NOTE) The eGFR has been calculated using the CKD EPI equation. This calculation has not been validated in all clinical situations. eGFR's persistently <60 mL/min signify possible Chronic Kidney Disease.    Anion gap 10 5 - 15  Surgical pcr screen     Status: None   Collection Time: 11/27/16  9:57 PM  Result Value Ref Range   MRSA, PCR NEGATIVE NEGATIVE   Staphylococcus aureus NEGATIVE NEGATIVE    Comment:        The Xpert SA Assay (FDA approved for NASAL specimens in patients over 10 years of age), is one component of a comprehensive surveillance program.  Test performance has been validated by [facility] for patients greater than or equal to 81 year old. It is not intended to diagnose infection nor to guide or monitor treatment.   APTT     Status: Abnormal   Collection Time: 11/28/16  5:39 AM  Result Value Ref Range   aPTT 48 (H) 24 - 36 seconds    Comment:        IF BASELINE aPTT IS ELEVATED, SUGGEST PATIENT RISK ASSESSMENT BE USED TO DETERMINE APPROPRIATE ANTICOAGULANT THERAPY.   Protime-INR     Status: None   Collection Time: 11/28/16  5:39 AM  Result Value Ref Range   Prothrombin Time 14.8 11.4 - 15.2 seconds   INR 1.15   CBC     Status: Abnormal   Collection Time: 11/28/16  5:39 AM  Result Value Ref  Range   WBC 8.9 4.0 - 10.5 K/uL   RBC 3.51 (L) 3.87 - 5.11 MIL/uL   Hemoglobin 10.9 (L) 12.0 - 15.0 g/dL   HCT 34.7 (L) 36.0 - 46.0 %   MCV 98.9 78.0 - 100.0 fL   MCH 31.1 26.0 - 34.0 pg   MCHC 31.4 30.0 - 36.0 g/dL   RDW 13.5 11.5 - 15.5 %   Platelets 240 150 - 400 K/uL  Basic metabolic panel     Status: Abnormal   Collection Time: 11/28/16  5:39 AM  Result Value Ref Range   Sodium 138 135 - 145 mmol/L   Potassium 4.4 3.5 - 5.1 mmol/L   Chloride 98 (L) 101 - 111 mmol/L   CO2 32 22 - 32 mmol/L   Glucose, Bld 95 65 - 99 mg/dL   BUN 27 (H) 6 - 20 mg/dL   Creatinine, Ser 1.51 (H) 0.44 - 1.00 mg/dL   Calcium 9.2 8.9 - 10.3 mg/dL   GFR calc non Af Amer 28 (L) >60 mL/min   GFR calc Af Amer 33 (L) >60 mL/min    Comment: (NOTE) The eGFR has been calculated using the CKD EPI equation. This calculation has not been validated in all clinical situations. eGFR's persistently <60 mL/min signify possible Chronic Kidney Disease.    Anion gap 8 5 - 15    Dg Chest 1 View  Result Date: 11/27/2016 CLINICAL DATA:  Hypoxia  EXAM: CHEST 1 VIEW COMPARISON:  Diarrhea 16 2018 and August 24, 2015 FINDINGS: There is no edema or consolidation. There is apparent epicardial fat prominence along the lateral heart border on the left, unchanged. Heart is mildly prominent with pulmonary vascularity within normal limits, stable. There is aortic atherosclerosis. No adenopathy. No bone lesions. IMPRESSION: No edema or consolidation. Prominent epicardial fat on the left laterally. Stable cardiac silhouette. There is aortic atherosclerosis. Electronically Signed   By: Lowella Grip III M.D.   On: 11/27/2016 11:56   Pelvis Portable  Result Date: 11/28/2016 CLINICAL DATA:  Status post left hip replacement today. The patient suffered an impacted subcapital fracture of the left femoral neck due to a fall 11/27/2016. EXAM: PORTABLE PELVIS 1-2 VIEWS COMPARISON:  CT left hip and plain films left hip 11/27/2016. FINDINGS:  New bipolar left hip hemiarthroplasty is identified. The device is located. No fracture is identified. Surgical staples are noted. Old right hip replacement is noted. IMPRESSION: Status post left hip replacement today without evidence of complication. No acute abnormality. Electronically Signed   By: Inge Rise M.D.   On: 11/28/2016 11:36   Ct Hip Left Wo Contrast  Result Date: 11/27/2016 CLINICAL DATA:  Left hip pain status post fall. EXAM: CT OF THE LEFT HIP WITHOUT CONTRAST TECHNIQUE: Multidetector CT imaging of the left hip was performed according to the standard protocol. Multiplanar CT image reconstructions were also generated. COMPARISON:  None. FINDINGS: Bones/Joint/Cartilage Generalized osteopenia. Impacted, nondisplaced subcapital left femoral neck fracture. No other fracture or dislocation. Normal alignment. No joint effusion. No lytic or sclerotic osseous lesion. Ligaments Ligaments are suboptimally evaluated by CT. Muscles and Tendons Generalized muscle atrophy. No intramuscular fluid collection or hematoma. Soft tissue No fluid collection or hematoma. No soft tissue mass. Peripheral vascular atherosclerotic disease. IMPRESSION: 1. Impacted, nondisplaced subcapital left femoral neck fracture. Electronically Signed   By: Kathreen Devoid   On: 11/27/2016 14:05   Dg Hip Unilat With Pelvis 2-3 Views Left  Result Date: 11/27/2016 CLINICAL DATA:  Left hip pain after fall. EXAM: DG HIP (WITH OR WITHOUT PELVIS) 2-3V LEFT COMPARISON:  Radiographs of November 12, 2016. FINDINGS: Status post right hip arthroplasty. No definite dislocation is noted. Irregularity is seen involving the trochanteric region of the proximal left femur concerning for possible fracture. IMPRESSION: Irregularity seen involving the trochanteric region of proximal left femur concerning for possible fracture. CT or MRI scan is recommended for further evaluation. Electronically Signed   By: Marijo Conception, M.D.   On: 11/27/2016 11:59     ROS Blood pressure (!) 97/44, pulse 66, temperature 97.3 F (36.3 C), resp. rate 11, SpO2 99 %. Physical Exam  Constitutional: She appears well-developed.  Thin elderly pleasant mildly confused. She appears her stated age of 71.  HENT:  Head: Normocephalic and atraumatic.  Right Ear: External ear normal.  Left Ear: External ear normal.  Eyes: Conjunctivae are normal. Pupils are equal, round, and reactive to light.  Neck: Normal range of motion. Neck supple. No tracheal deviation present. No thyromegaly present.  Cardiovascular: Normal rate and normal heart sounds.   Respiratory: Effort normal and breath sounds normal. No respiratory distress. She has no wheezes.  GI: Soft. Bowel sounds are normal.  Genitourinary:  Genitourinary Comments: External genitalia normal. Patient has Foley catheter.  Musculoskeletal:  Left lower extremity short external rotated. Slight edema. No effusion of the knee. She has sensation to the foot. Sciatic sensation is intact. Pain with left hip range of motion. Right  hip well-healed posterior incision. Sacral decubiti that is had day decubiti pad over the sacrum.  Skin: Skin is warm and dry. No rash noted. No erythema.  Sacral had with the sacral decubiti.  Psychiatric:  Pleasant alert confused.    Assessment/Plan: Displaced femoral neck fracture 83 year old female with extensive multiple medical problems involving multisystem's. She is at increased risk for her hip fracture and has attended 20% mortality rate in the first year due to her hip fracture. Procedure planned for surgery discussed with family members. Informed consent obtained. They understand planned procedure and requested we proceed. Plan would be hemiarthroplasty under general anesthesia.  Marybelle Killings 11/28/2016, 12:20 PM

## 2016-11-28 NOTE — Progress Notes (Signed)
Orthopedic Tech Progress Note Patient Details:  [name] [name] 1919-07-09 [id number]  Because of pt age Over head frame will not be provided.    [name] 11/28/2016, 9:39 PM

## 2016-11-28 NOTE — Op Note (Signed)
Preop diagnosis: Left femoral neck fracture  Postoperative diagnosis same  Procedure: left hip hemiarthroplasty monopolar full or displaced femoral neck fracture.  Surgeon: It M.D.  Assistant RNFA  Anesthesia Gen. +15 cc Marcaine skin local  Implants : Depuy [place] basic #4 stem +0 neck and 45 mm ball  Procedure after induction general anesthesia patient placed in lateral position with actually well, procedure was completed DuraPrep preoperative Ancef prophylaxis. Usual total hips sheet drapes impervious stockinette Coban and sterile skin marker and Betadine Steri-Drape were applied. Posterior approach was made deep Charnley retractor was placed piriformis was split adjacent to the bone tagged for later repair posterior capsule was opened hematoma was evacuated and the neck was cut 1 fingerbreadth above the lesser trochanter ball was removed with the corkscrew sized at 45 mm identical to the opposite hip which had surgery a few years ago for femoral neck fracture. Sequential canal preparation once no bone present in the acetabulum there is no acetabular wear. Sequential progression up to #4 stem gave nice fit it was opened inserted and then +0 neck and 45 mm ball was placed. Hip was reduced leg lengths were equal stability with flexion and 90 internal rotation 80 without dislocation. Copious irrigation prior to placing the stem. Capsule was repaired posteriorly with Ethibond. Piriformis repaired back the gluteus medius and then tensor fascia repair gluteus maximus repair with the Ethibond and #1 Vicryl. 2-0 Vicryl subtenons tissue skin subcutaneous closure marked infiltration of the skin and postop dressing with knee immobilizer.

## 2016-11-28 NOTE — Anesthesia Postprocedure Evaluation (Signed)
Anesthesia Post Note  Patient: [name] [name]  Procedure(s) Performed: Procedure(s) (LRB): ARTHROPLASTY BIPOLAR HIP (HEMIARTHROPLASTY) (Left)  Patient location during evaluation: PACU Anesthesia Type: General Level of consciousness: awake and alert Pain management: pain level controlled Vital Signs Assessment: post-procedure vital signs reviewed and stable Respiratory status: spontaneous breathing, nonlabored ventilation, respiratory function stable and patient connected to nasal cannula oxygen Cardiovascular status: blood pressure returned to baseline and stable Postop Assessment: no signs of nausea or vomiting Anesthetic complications: no       Last Vitals:  Vitals:   11/28/16 1230 11/28/16 1245  BP: (!) 110/42 (!) 103/32  Pulse: 70 67  Resp: 15   Temp:      Last Pain:  Vitals:   11/28/16 1210  TempSrc:   PainSc: Asleep                 Shakeira Rhee A.

## 2016-11-28 NOTE — Care Management Note (Signed)
Case Management Note  Patient Details  Name: [name] [name] MRN: [id number] Date of Birth: 1919/08/24  Subjective/Objective:        CM following for progression and d/c planning.             Action/Plan: 11/28/2016 Noted that pt is SNF resident and will need to return to this level of care for rehab. [facility] for ongoing care and rehab.   Expected Discharge Date:    11/30/2016              Expected Discharge Plan:  Skilled Nursing Facility  In-House Referral:  Clinical Social Work  Discharge planning Services  NA  Post Acute Care Choice:  NA Choice offered to:  NA  DME Arranged:  N/A DME Agency:  NA  HH Arranged:  NA HH Agency:  NA  Status of Service:  Completed, signed off  If discussed at [employer] of Stay Meetings, dates discussed:    Additional Comments:  Starlyn Skeans, RN 11/28/2016, 12:59 PM

## 2016-11-28 NOTE — H&P (View-Only) (Signed)
Reason for Consult:Left hip pain Referring Physician: [name] is an 83 y.o. female.  HPI: [name] [name] fell at the SNF where she [facility] on 3/21. She was brought to the ED where her workup was negative. She was discharged back to the SNF. She seemed to do ok for several days but then began to c/o left hip pain and was reluctant to be mobilized. Her family brought her back to the ED. Plain films today showed a possible hip fx and a CT scan confirmed a subcapital femur fx on the left.  Past Medical History:  Diagnosis Date  . AKI (acute kidney injury) ([place]) 07/2015  . CAD (coronary artery disease)    a. 02/2001 PTCA & BMS to the OM1;  b. 08/2001 CBA to ISR in [place], CBA of mid LAD;  c. 02/2002 Failed PCI of mLAD occlusion, LCX nl, patent OM1 stent, RCA dom, 40p, PDA/PL nl, EF 50% mild antlat HK.  . Cataract    both eyes  . Glaucoma   . Hypertension   . Hypertriglyceridemia   . Osteoarthritis   . Poor circulation   . Thyroid disease    hypothyroidism, hypothyroidism  . UTI (urinary tract infection) 08/24/2015    Past Surgical History:  Procedure Laterality Date  . ABDOMINAL HYSTERECTOMY  1972  . CARDIAC SURGERY  2001   stent placement  . EYE SURGERY  2007/2008   cataracts  . GALLBLADDER SURGERY  2009  . HERNIA REPAIR  2009  . HIP ARTHROPLASTY  06/06/2012   Procedure: ARTHROPLASTY BIPOLAR HIP;  Surgeon: Marybelle Killings, MD;  Location: [facility];  Service: Orthopedics;  Laterality: Right;  monopolar    Family History  Problem Relation Age of Onset  . Arthritis Father   . Stroke Father   . Hypertension Father   . Heart disease Sister   . Heart disease Brother   . Hypertension Brother   . Stroke Brother     Social History:  reports that she has never smoked. She has never used smokeless tobacco. She reports that she does not drink alcohol or use drugs.  Allergies:  Allergies  Allergen Reactions  . Codeine Nausea And Vomiting  . Morphine And Related Nausea And Vomiting  .  Penicillins Hives    Medications: I have reviewed the patient's current medications.  Results for orders placed or performed during the hospital encounter of 11/27/16 (from the past 48 hour(s))  Type and screen Ken Caryl     Status: None   Collection Time: 11/27/16 11:18 AM  Result Value Ref Range   ABO/RH(D) O POS    Antibody Screen NEG    Sample Expiration 11/30/2016   CBC WITH DIFFERENTIAL     Status: Abnormal   Collection Time: 11/27/16 11:23 AM  Result Value Ref Range   WBC 9.6 4.0 - 10.5 K/uL   RBC 3.61 (L) 3.87 - 5.11 MIL/uL   Hemoglobin 11.1 (L) 12.0 - 15.0 g/dL   HCT 35.0 (L) 36.0 - 46.0 %   MCV 97.0 78.0 - 100.0 fL   MCH 30.7 26.0 - 34.0 pg   MCHC 31.7 30.0 - 36.0 g/dL   RDW 13.2 11.5 - 15.5 %   Platelets 220 150 - 400 K/uL   Neutrophils Relative % 65 %   Neutro Abs 6.2 1.7 - 7.7 K/uL   Lymphocytes Relative 16 %   Lymphs Abs 1.5 0.7 - 4.0 K/uL   Monocytes Relative 12 %  Monocytes Absolute 1.2 (H) 0.1 - 1.0 K/uL   Eosinophils Relative 7 %   Eosinophils Absolute 0.7 0.0 - 0.7 K/uL   Basophils Relative 0 %   Basophils Absolute 0.0 0.0 - 0.1 K/uL  Protime-INR     Status: None   Collection Time: 11/27/16 11:23 AM  Result Value Ref Range   Prothrombin Time 14.4 11.4 - 15.2 seconds   INR 1.11   Comprehensive metabolic panel     Status: Abnormal   Collection Time: 11/27/16 12:00 PM  Result Value Ref Range   Sodium 135 135 - 145 mmol/L   Potassium 3.9 3.5 - 5.1 mmol/L   Chloride 101 101 - 111 mmol/L   CO2 24 22 - 32 mmol/L   Glucose, Bld 102 (H) 65 - 99 mg/dL   BUN 32 (H) 6 - 20 mg/dL   Creatinine, Ser 1.66 (H) 0.44 - 1.00 mg/dL   Calcium 9.0 8.9 - 10.3 mg/dL   Total Protein 6.7 6.5 - 8.1 g/dL   Albumin 2.8 (L) 3.5 - 5.0 g/dL   AST 22 15 - 41 U/L   ALT 14 14 - 54 U/L   Alkaline Phosphatase 78 38 - 126 U/L   Total Bilirubin 0.3 0.3 - 1.2 mg/dL   GFR calc non Af Amer 25 (L) >60 mL/min   GFR calc Af Amer 29 (L) >60 mL/min    Comment:  (NOTE) The eGFR has been calculated using the CKD EPI equation. This calculation has not been validated in all clinical situations. eGFR's persistently <60 mL/min signify possible Chronic Kidney Disease.    Anion gap 10 5 - 15    Dg Chest 1 View  Result Date: 11/27/2016 CLINICAL DATA:  Hypoxia EXAM: CHEST 1 VIEW COMPARISON:  Diarrhea 16 2018 and August 24, 2015 FINDINGS: There is no edema or consolidation. There is apparent epicardial fat prominence along the lateral heart border on the left, unchanged. Heart is mildly prominent with pulmonary vascularity within normal limits, stable. There is aortic atherosclerosis. No adenopathy. No bone lesions. IMPRESSION: No edema or consolidation. Prominent epicardial fat on the left laterally. Stable cardiac silhouette. There is aortic atherosclerosis. Electronically Signed   By: Lowella Grip III M.D.   On: 11/27/2016 11:56   Ct Hip Left Wo Contrast  Result Date: 11/27/2016 CLINICAL DATA:  Left hip pain status post fall. EXAM: CT OF THE LEFT HIP WITHOUT CONTRAST TECHNIQUE: Multidetector CT imaging of the left hip was performed according to the standard protocol. Multiplanar CT image reconstructions were also generated. COMPARISON:  None. FINDINGS: Bones/Joint/Cartilage Generalized osteopenia. Impacted, nondisplaced subcapital left femoral neck fracture. No other fracture or dislocation. Normal alignment. No joint effusion. No lytic or sclerotic osseous lesion. Ligaments Ligaments are suboptimally evaluated by CT. Muscles and Tendons Generalized muscle atrophy. No intramuscular fluid collection or hematoma. Soft tissue No fluid collection or hematoma. No soft tissue mass. Peripheral vascular atherosclerotic disease. IMPRESSION: 1. Impacted, nondisplaced subcapital left femoral neck fracture. Electronically Signed   By: Kathreen Devoid   On: 11/27/2016 14:05   Dg Hip Unilat With Pelvis 2-3 Views Left  Result Date: 11/27/2016 CLINICAL DATA:  Left hip pain  after fall. EXAM: DG HIP (WITH OR WITHOUT PELVIS) 2-3V LEFT COMPARISON:  Radiographs of November 12, 2016. FINDINGS: Status post right hip arthroplasty. No definite dislocation is noted. Irregularity is seen involving the trochanteric region of the proximal left femur concerning for possible fracture. IMPRESSION: Irregularity seen involving the trochanteric region of proximal left femur concerning for possible  fracture. CT or MRI scan is recommended for further evaluation. Electronically Signed   By: Marijo Conception, M.D.   On: 11/27/2016 11:59    Review of Systems  Constitutional: Negative for weight loss.  HENT: Negative for ear discharge, ear pain, hearing loss and tinnitus.   Eyes: Negative for blurred vision, double vision, photophobia and pain.  Respiratory: Negative for cough, sputum production and shortness of breath.   Cardiovascular: Negative for chest pain.  Gastrointestinal: Negative for abdominal pain, nausea and vomiting.  Genitourinary: Positive for dysuria. Negative for flank pain, frequency and urgency.  Musculoskeletal: Positive for joint pain (Left hip). Negative for back pain, falls, myalgias and neck pain.  Neurological: Negative for dizziness, tingling, sensory change, focal weakness, loss of consciousness and headaches.  Endo/Heme/Allergies: Does not bruise/bleed easily.  Psychiatric/Behavioral: Negative for depression, memory loss and substance abuse. The patient is not nervous/anxious.    Blood pressure (!) 137/50, pulse 63, resp. rate 10, SpO2 98 %. Physical Exam  Constitutional: She appears well-developed and well-nourished. No distress.  HENT:  Head: Normocephalic.  Eyes: Conjunctivae are normal. Right eye exhibits no discharge. Left eye exhibits no discharge. No scleral icterus.  Neck: Neck supple.  Cardiovascular: Normal rate and regular rhythm.   Respiratory: Effort normal. No respiratory distress.  GI: Soft.  Musculoskeletal:  Bilateral shoulder, elbow, wrist,  digits- no skin wounds, nontender, no instability, no blocks to motion  Sens  Ax/R/M/U intact  Mot   Ax/ R/ PIN/ M/ AIN/ U intact  Rad 2+  Pelvis--no traumatic wounds or rash, no ecchymosis, stable to manual stress, nontender  LLE No traumatic wounds, ecchymosis, or rash  TTP left hip  No effusions  Knee stable to varus/ valgus and anterior/posterior stress  Sens DPN, TN intact, SPN paresthetic  Motor EHL, ext, flex, evers 5/5  DP 1+, PT 0, No significant edema   LLE No traumatic wounds, ecchymosis, or rash  Nontender except with knee stress, states usual  No effusions  Knee stable to varus/ valgus and anterior/posterior stress  Sens DPN, SPN, TN intact  Motor EHL, ext, flex, evers 5/5  DP 1+, PT 1+, No significant edema  Lymphadenopathy:    She has no cervical adenopathy.  Neurological: She is alert.  Skin: Skin is warm and dry. She is not diaphoretic.  Psychiatric: She has a normal mood and affect. Her behavior is normal.    Assessment/Plan: Fall (3/21) Left subcapital femur fx -- Will plan for hemiarthroplasty tomorrow by Dr. Lorin Mercy. IM to admit pt. NPO after MN. Ok to start on Lovenox today, please hold tomorrow's dose before surgery. Will order PT/OT to start tomorrow. Multiple medical problems -- per IM    Lisette Abu, PA-C Orthopedic Surgery [phone] 11/27/2016, 2:25 PM

## 2016-11-28 NOTE — Progress Notes (Signed)
OT Cancellation Note  Patient Details Name: [name] [name] MRN: [id number] DOB: October 21, 1918   Cancelled Treatment:    Reason Eval/Treat Not Completed: Fatigue/lethargy limiting ability to participate;Patient not medically ready Pt recently returned from surgery, however still asleep from anesthesia.  Spoke with RN who reports defer till tomorrow to allow increased arousal.  Tali Coster, [facility] 11/28/2016, 1:57 PM

## 2016-11-28 NOTE — Progress Notes (Signed)
PT Cancellation Note  Patient Details Name: [name] [name] MRN: [id number] DOB: [birth year]   Cancelled Treatment:    Reason Eval/Treat Not Completed: Patient not medically ready; patient is for hip hemiarthroplasty today.  Will await fixation prior to starting PT.    Elray Mcgregor 11/28/2016, 8:32 AM Sheran Lawless, PT [phone] 11/28/2016

## 2016-11-28 NOTE — Progress Notes (Addendum)
PROGRESS NOTE   [name] [name]  ZOX:[id number]    DOB: 1918/12/29    DOA: 11/27/2016  PCP: Merrilee Seashore, MD   I have briefly reviewed patients [facility] medical [facility].  Brief Narrative:  83 year old female, SNF resident, with PMH of CAD, dementia, HTN, HLD, hypothyroid presented to ED with complaints of left hip pain after falls. CT confirmed left subcapital femur fracture. Orthopedics was consulted and she underwent left hip hemiarthroplasty on 11/28/16.   Assessment & Plan:   Principal Problem:   Closed left hip fracture (HCC) Active Problems:   CAD (coronary artery disease)   Essential hypertension, benign   Anemia   OA (osteoarthritis)   HLD (hyperlipidemia)   GERD (gastroesophageal reflux disease)   CKD (chronic kidney disease) stage 3, GFR 30-59 ml/min   Hx pulmonary embolism   Hypothyroidism   Hip fracture (HCC)   Dementia   Pressure injury of skin   1. Left femoral neck fracture, status post hemiarthroplasty 11/28/16: Hip fracture sustained status post fall and possible UTI. Orthopedics was consulted and patient underwent surgery. Management per orthopedics. 2. Possible UTI: No history available from patient. Urine microscopy suspicious for UTI. IV Rocephin (tolerated preoperative cefazolin) pending urine culture results. 3. Anemia: Stable. Follow CBC in a.m. 4. Stage III chronic kidney disease: Creatinine at baseline. Follow BMP periodically. 5. Essential hypertension: Controlled. Continue metoprolol. 6. CAD status post stent: No reported chest pain. EKG without acute changes. Home aspirin and Plavix held for surgery, resume when okay with orthopedics. Continue metoprolol. 7. Advanced dementia: Mental status at baseline per family. 8. Ataxia and falls: Secondary to advanced age, dementia and unsteady gait. PT evaluation. 9. Hypothyroid: Continue Synthroid.   DVT prophylaxis: Lovenox Code Status: DO NOT RESUSCITATE Family Communication: None at  bedside Disposition: DC to SNF when medically improved and cleared by orthopedics   Consultants:  Orthopedics   Procedures:  Left hip hemiarthroplasty 11/28/16  Antimicrobials:  Ceftriaxone    Subjective: On my arrival, he shouldn't was already in the OR this morning. Hence seen postoperatively. Somnolent but easily arousable, opens eyes, mumbles. As per nursing, no acute issues reported.  ROS: Unable to obtain  Objective:  Vitals:   11/28/16 1245 11/28/16 1327 11/28/16 1358 11/28/16 1508  BP: (!) 103/32 (!) 113/40 (!) 108/46 (!) 120/46  Pulse: 67 64 (!) 58 71  Resp:  20 (!) 21 20  Temp:      TempSrc:      SpO2: 95% 95% 92% 95%    Examination:  General exam: Pleasant elderly female, frail. Lying comfortably propped up in bed. Respiratory system: Clear to auscultation. Respiratory effort normal. Mouth breathing. Cardiovascular system: S1 & S2 heard, RRR. No JVD, murmurs, rubs, gallops or clicks. No pedal edema. Telemetry: Sinus rhythm with first-degree AV block. Gastrointestinal system: Abdomen is nondistended, soft and nontender. No organomegaly or masses felt. Normal bowel sounds heard. Central nervous system: Mental status as above. Does not follow instructions. No focal neurological deficits. Extremities: Spontaneously moves upper extremities. Left hip surgical site clean and dry. Left lower extremity in splint. Peripheral pulses symmetrically felt. Skin: No rashes, lesions or ulcers Psychiatry: Unable to assess.    Data Reviewed: I have personally reviewed following labs and imaging studies  CBC:  Recent Labs Lab 11/27/16 1123 11/28/16 0539 11/28/16 1251  WBC 9.6 8.9 16.8*  NEUTROABS 6.2  --   --   HGB 11.1* 10.9* 10.9*  HCT 35.0* 34.7* 34.9*  MCV 97.0 98.9 98.6  PLT 220 240 255   Basic Metabolic Panel:  Recent Labs Lab 11/27/16 1200 11/28/16 0539 11/28/16 1251  NA 135 138  --   K 3.9 4.4  --   CL 101 98*  --   CO2 24 32  --   GLUCOSE 102* 95   --   BUN 32* 27*  --   CREATININE 1.66* 1.51* 1.48*  CALCIUM 9.0 9.2  --    Liver Function Tests:  Recent Labs Lab 11/27/16 1200  AST 22  ALT 14  ALKPHOS 78  BILITOT 0.3  PROT 6.7  ALBUMIN 2.8*   Coagulation Profile:  Recent Labs Lab 11/27/16 1123 11/28/16 0539  INR 1.11 1.15     Recent Results (from the past 240 hour(s))  Surgical pcr screen     Status: None   Collection Time: 11/27/16  9:57 PM  Result Value Ref Range Status   MRSA, PCR NEGATIVE NEGATIVE Final   Staphylococcus aureus NEGATIVE NEGATIVE Final    Comment:        The Xpert SA Assay (FDA approved for NASAL specimens in patients over 58 years of age), is one component of a comprehensive surveillance program.  Test performance has been validated by [facility] for patients greater than or equal to 5 year old. It is not intended to diagnose infection nor to guide or monitor treatment.          Radiology Studies: Dg Chest 1 View  Result Date: 11/27/2016 CLINICAL DATA:  Hypoxia EXAM: CHEST 1 VIEW COMPARISON:  Diarrhea 16 2018 and August 24, 2015 FINDINGS: There is no edema or consolidation. There is apparent epicardial fat prominence along the lateral heart border on the left, unchanged. Heart is mildly prominent with pulmonary vascularity within normal limits, stable. There is aortic atherosclerosis. No adenopathy. No bone lesions. IMPRESSION: No edema or consolidation. Prominent epicardial fat on the left laterally. Stable cardiac silhouette. There is aortic atherosclerosis. Electronically Signed   By: Bretta Bang III M.D.   On: 11/27/2016 11:56   Pelvis Portable  Result Date: 11/28/2016 CLINICAL DATA:  Status post left hip replacement today. The patient suffered an impacted subcapital fracture of the left femoral neck due to a fall 11/27/2016. EXAM: PORTABLE PELVIS 1-2 VIEWS COMPARISON:  CT left hip and plain films left hip 11/27/2016. FINDINGS: New bipolar left hip hemiarthroplasty is  identified. The device is located. No fracture is identified. Surgical staples are noted. Old right hip replacement is noted. IMPRESSION: Status post left hip replacement today without evidence of complication. No acute abnormality. Electronically Signed   By: Drusilla Kanner M.D.   On: 11/28/2016 11:36   Ct Hip Left Wo Contrast  Result Date: 11/27/2016 CLINICAL DATA:  Left hip pain status post fall. EXAM: CT OF THE LEFT HIP WITHOUT CONTRAST TECHNIQUE: Multidetector CT imaging of the left hip was performed according to the standard protocol. Multiplanar CT image reconstructions were also generated. COMPARISON:  None. FINDINGS: Bones/Joint/Cartilage Generalized osteopenia. Impacted, nondisplaced subcapital left femoral neck fracture. No other fracture or dislocation. Normal alignment. No joint effusion. No lytic or sclerotic osseous lesion. Ligaments Ligaments are suboptimally evaluated by CT. Muscles and Tendons Generalized muscle atrophy. No intramuscular fluid collection or hematoma. Soft tissue No fluid collection or hematoma. No soft tissue mass. Peripheral vascular atherosclerotic disease. IMPRESSION: 1. Impacted, nondisplaced subcapital left femoral neck fracture. Electronically Signed   By: Elige Ko   On: 11/27/2016 14:05   Dg Hip Unilat With Pelvis 2-3 Views Left  Result  Date: 11/27/2016 CLINICAL DATA:  Left hip pain after fall. EXAM: DG HIP (WITH OR WITHOUT PELVIS) 2-3V LEFT COMPARISON:  Radiographs of November 12, 2016. FINDINGS: Status post right hip arthroplasty. No definite dislocation is noted. Irregularity is seen involving the trochanteric region of the proximal left femur concerning for possible fracture. IMPRESSION: Irregularity seen involving the trochanteric region of proximal left femur concerning for possible fracture. CT or MRI scan is recommended for further evaluation. Electronically Signed   By: Lupita Raider, M.D.   On: 11/27/2016 11:59        Scheduled Meds: . [START ON  11/29/2016]  ceFAZolin (ANCEF) IV  2 g Intravenous On Call to OR  . chlorhexidine  60 mL Topical Once  . docusate sodium  100 mg Oral BID  . dorzolamide-timolol  1 drop Both Eyes Q12H  . [START ON 11/29/2016] enoxaparin (LOVENOX) injection  30 mg Subcutaneous Q24H  . escitalopram  5 mg Oral Daily  . HYDROmorphone      . latanoprost  1 drop Both Eyes QHS  . levothyroxine  50 mcg Oral QAC breakfast  . metoprolol tartrate  25 mg Oral BID  . pantoprazole  40 mg Oral Daily  . povidone-iodine  2 application Topical Once   Continuous Infusions: . sodium chloride 50 mL/hr at 11/28/16 1300  . lactated ringers Stopped (11/28/16 1300)     LOS: 1 day     Tavious Griesinger, MD, FACP, FHM. [facility] Pager [phone]  If 7PM-7AM, please contact night-coverage [email or web address] Password [facility] 11/28/2016, 4:46 PM

## 2016-11-28 NOTE — Anesthesia Preprocedure Evaluation (Addendum)
Anesthesia Evaluation  Patient identified by MRN, date of birth, ID band Patient awake    Reviewed: Allergy & Precautions, NPO status , Patient's Chart, lab work & pertinent test results, reviewed documented beta blocker date and time   Airway Mallampati: II  TM Distance: >3 FB Neck ROM: Full    Dental no notable dental hx. (+) Poor Dentition, Missing   Pulmonary shortness of breath, COPD,    Pulmonary exam normal breath sounds clear to auscultation       Cardiovascular hypertension, Pt. on medications and Pt. on home beta blockers + angina at rest and with exertion + CAD, + Past MI, + Cardiac Stents and + Peripheral Vascular Disease  Normal cardiovascular exam Rhythm:Regular Rate:Normal     Neuro/Psych PSYCHIATRIC DISORDERS Anxiety Depression DementiaGlaucoma  Cataract ou HOH    GI/Hepatic GERD  ,  Endo/Other  Hypothyroidism   Renal/GU Renal InsufficiencyRenal disease  negative genitourinary   Musculoskeletal  (+) Arthritis , Fx Left hip   Abdominal   Peds  Hematology  (+) anemia ,   Anesthesia Other Findings   Reproductive/Obstetrics                            Anesthesia Physical Anesthesia Plan  ASA: III  Anesthesia Plan:    Post-op Pain Management:    Induction: Intravenous  Airway Management Planned: Oral ETT  Additional Equipment:   Intra-op Plan:   Post-operative Plan: Extubation in OR  Informed Consent: I have reviewed the patients History and Physical, chart, labs and discussed the procedure including the risks, benefits and alternatives for the proposed anesthesia with the patient or authorized representative who has indicated his/her understanding and acceptance.   Dental advisory given  Plan Discussed with: Anesthesiologist, CRNA and Surgeon  Anesthesia Plan Comments:         Anesthesia Quick Evaluation

## 2016-11-28 NOTE — Progress Notes (Signed)
Initial Nutrition Assessment  DOCUMENTATION CODES:   Not applicable  INTERVENTION:    Diet advancement as alertness improves.  When diet advanced, add Ensure Enlive BID between meals to maximize protein and calorie intake, each supplement provides 350 kcal and 20 grams of protein  NUTRITION DIAGNOSIS:   Increased nutrient needs related to other (see comment) (healing after hip surgery) as evidenced by estimated needs.  GOAL:   Patient will meet greater than or equal to 90% of their needs  MONITOR:   Diet advancement, PO intake  REASON FOR ASSESSMENT:   Consult Hip fracture protocol  ASSESSMENT:   83 y.o. female with PMH of CAD, glaucoma, hypertension, osteoarthritis, thyroid disease, who presented to the ED on 4/5 with left hip fracture.  Patient just back from the OR when RD visited. She was still sleeping. Nutrition-Focused physical exam completed. Findings are no fat depletion, mild muscle depletion, and unable to assess edema.  Patient would benefit from PO supplementation when diet advanced to maximize intake to support healing. Labs and medications reviewed.  Diet Order:  Diet NPO time specified Diet NPO time specified Except for: Sips with Meds  Skin:  Wound (see comment) (stage I to L and R heels)  Last BM:  unknown  Height:   Ht Readings from Last 1 Encounters:  11/19/16  (1.575 m)    Weight:   Wt Readings from Last 1 Encounters:  11/19/16 160 lb (72.6 kg)    Ideal Body Weight:  50 kg  BMI:  29.3  Estimated Nutritional Needs:   Kcal:  1600-1800  Protein:  75-90 gm  Fluid:  1.6-1.8 L  EDUCATION NEEDS:   No education needs identified at this time  Joaquin Courts, RD, LDN, CNSC Pager [phone] After Hours Pager [phone]

## 2016-11-28 NOTE — Interval H&P Note (Signed)
History and Physical Interval Note:  11/28/2016 9:21 AM  [name] [name]  has presented today for surgery, with the diagnosis of hip fx  The various methods of treatment have been discussed with the patient and family. After consideration of risks, benefits and other options for treatment, the patient has consented to  Procedure(s): ARTHROPLASTY BIPOLAR HIP (HEMIARTHROPLASTY) (Left) as a surgical intervention .  The patient's history has been reviewed, patient examined, no change in status, stable for surgery.  I have reviewed the patient's chart and labs.  Questions were answered to the patient's satisfaction.     Eldred Manges

## 2016-11-28 NOTE — Addendum Note (Signed)
Addendum  created 11/28/16 1408 by Elisabeth Most, CRNA   Anesthesia Intra Flowsheets edited

## 2016-11-28 NOTE — Brief Op Note (Signed)
11/27/2016 - 11/28/2016  10:54 AM  PATIENT:  Bufford Lope Murguia  83 y.o. female  PRE-OPERATIVE DIAGNOSIS:  Left Hip Fracture   POST-OPERATIVE DIAGNOSIS:  Left Hip Fracture   PROCEDURE:  Procedure(s): ARTHROPLASTY BIPOLAR HIP (HEMIARTHROPLASTY) (Left)  SURGEON:  Surgeon(s) and Role:    * Eldred Manges, MD - Primary  PHYSICIAN ASSISTANT:   ASSISTANTS: RNFA   ANESTHESIA:   general  EBL:  Total I/O In: 600 [I.V.:600] Out: 100 [Blood:100]  BLOOD ADMINISTERED:none  DRAINS: none   LOCAL MEDICATIONS USED:  MARCAINE     SPECIMEN:  No Specimen  DISPOSITION OF SPECIMEN:  N/A  COUNTS:  YES  TOURNIQUET:  * No tourniquets in log *  DICTATION: .Dragon Dictation  PLAN OF CARE: already IP  PATIENT DISPOSITION:  PACU - hemodynamically stable.   Delay start of Pharmacological VTE agent (>24hrs) due to surgical blood loss or risk of bleeding: not applicable

## 2016-11-28 NOTE — Transfer of Care (Signed)
Immediate Anesthesia Transfer of Care Note  Patient: [name] [name]  Procedure(s) Performed: Procedure(s): ARTHROPLASTY BIPOLAR HIP (HEMIARTHROPLASTY) (Left)  Patient Location: PACU  Anesthesia Type:General  Level of Consciousness: awake, sedated, patient cooperative and responds to stimulation  Airway & Oxygen Therapy: Patient Spontanous Breathing and Patient connected to nasal cannula oxygen  Post-op Assessment: Report given to RN, Post -op Vital signs reviewed and stable, Patient moving all extremities and Patient moving all extremities X 4  Post vital signs: Reviewed and stable  Last Vitals:  Vitals:   11/27/16 2213 11/28/16 0634  BP: (!) 108/36 (!) 154/58  Pulse: 65 72  Resp: 16 18  Temp: 36.4 C 37 C    Last Pain:  Vitals:   11/28/16 0758  TempSrc:   PainSc: Asleep         Complications: No apparent anesthesia complications

## 2016-11-28 NOTE — Progress Notes (Signed)
Report called to [name] [name] in short stay. 

## 2016-11-29 DIAGNOSIS — I959 Hypotension, unspecified: Secondary | ICD-10-CM

## 2016-11-29 DIAGNOSIS — N390 Urinary tract infection, site not specified: Secondary | ICD-10-CM

## 2016-11-29 DIAGNOSIS — D62 Acute posthemorrhagic anemia: Secondary | ICD-10-CM

## 2016-11-29 LAB — GLUCOSE, CAPILLARY
GLUCOSE-CAPILLARY: 135 mg/dL — AB (ref 65–99)
Glucose-Capillary: 147 mg/dL — ABNORMAL HIGH (ref 65–99)
Glucose-Capillary: 114 mg/dL — ABNORMAL HIGH (ref 65–99)

## 2016-11-29 LAB — BASIC METABOLIC PANEL
ANION GAP: 13 (ref 5–15)
BUN: 30 mg/dL — AB (ref 4–21)
BUN: 30 mg/dL — ABNORMAL HIGH (ref 6–20)
CHLORIDE: 97 mmol/L — AB (ref 101–111)
CO2: 26 mmol/L (ref 22–32)
Calcium: 8.8 mg/dL — ABNORMAL LOW (ref 8.9–10.3)
Creatinine, Ser: 1.67 mg/dL — ABNORMAL HIGH (ref 0.44–1.00)
Creatinine: 1.7 mg/dL — AB (ref 0.5–1.1)
GFR calc non Af Amer: 25 mL/min — ABNORMAL LOW (ref >60)
GFR, EST AFRICAN AMERICAN: 29 mL/min — AB (ref >60)
GLUCOSE: 214 mg/dL
Glucose, Bld: 214 mg/dL — ABNORMAL HIGH (ref 65–99)
POTASSIUM: 4.7 mmol/L (ref 3.5–5.1)
Potassium: 4.7 mmol/L (ref 3.4–5.3)
SODIUM: 136 mmol/L — AB (ref 137–147)
SODIUM: 136 mmol/L (ref 135–145)

## 2016-11-29 LAB — CBC
HEMATOCRIT: 31.1 % — AB (ref 36.0–46.0)
HEMOGLOBIN: 9.5 g/dL — AB (ref 12.0–15.0)
MCH: 29.9 pg (ref 26.0–34.0)
MCHC: 30.5 g/dL (ref 30.0–36.0)
MCV: 97.8 fL (ref 78.0–100.0)
Platelets: 264 10*3/uL (ref 150–400)
RBC: 3.18 MIL/uL — AB (ref 3.87–5.11)
RDW: 13 % (ref 11.5–15.5)
WBC: 10 10*3/uL (ref 4.0–10.5)

## 2016-11-29 LAB — CBC AND DIFFERENTIAL
HEMATOCRIT: 31 % — AB (ref 36–46)
HEMOGLOBIN: 9.5 g/dL — AB (ref 12.0–16.0)
Platelets: 264 10*3/uL (ref 150–399)
WBC: 10 10^3/mL

## 2016-11-29 MED ORDER — SODIUM CHLORIDE 0.9 % IV SOLN
INTRAVENOUS | Status: DC
  Administered 2016-11-29 – 2016-11-30 (×2): via INTRAVENOUS

## 2016-11-29 MED ORDER — SODIUM CHLORIDE 0.9 % IV BOLUS (SEPSIS)
500.0000 mL | Freq: Once | INTRAVENOUS | Status: AC
  Administered 2016-11-29: 500 mL via INTRAVENOUS

## 2016-11-29 MED ORDER — ASPIRIN 81 MG PO CHEW
81.0000 mg | CHEWABLE_TABLET | Freq: Every day | ORAL | Status: DC
  Administered 2016-11-30 – 2016-12-01 (×2): 81 mg via ORAL
  Filled 2016-11-29 (×2): qty 1

## 2016-11-29 MED ORDER — CLOPIDOGREL BISULFATE 75 MG PO TABS
75.0000 mg | ORAL_TABLET | Freq: Every day | ORAL | Status: DC
  Administered 2016-11-30 – 2016-12-01 (×2): 75 mg via ORAL
  Filled 2016-11-29 (×2): qty 1

## 2016-11-29 NOTE — Progress Notes (Signed)
Foley removed per MD order.  Patient tolerated well.  

## 2016-11-29 NOTE — Progress Notes (Signed)
BP 86/36 HR 76.  MD notified.

## 2016-11-29 NOTE — Evaluation (Signed)
Physical Therapy Evaluation Patient Details Name: [name] [name] MRN: [id number] DOB: 04/29/1919 Today's Date: 11/29/2016   History of Present Illness  83 y.o female admitted 11/27/16 following fall at SNF with displaced Lt femoral neck fracture.  Now s/p L hemi arthoplasty. Pt is WBAT with posterior hip precautions.    PMH:  CAD, dementia, HTN, HLD, multiple falls, Rt hip fx.  Clinical Impression  Patient presents with problems listed below.  Will benefit from acute PT to maximize functional mobility prior to return to SNF.    Follow Up Recommendations SNF;Supervision/Assistance - 24 hour    Equipment Recommendations  None recommended by PT    Recommendations for Other Services       Precautions / Restrictions Precautions Precautions: Fall;Posterior Hip Precaution Booklet Issued: No Restrictions Weight Bearing Restrictions: Yes LLE Weight Bearing: Weight bearing as tolerated      Mobility  Bed Mobility Overal bed mobility: Needs Assistance Bed Mobility: Supine to Sit;Sit to Supine     Supine to sit: Total assist;+2 for physical assistance Sit to supine: Total assist;+2 for physical assistance   General bed mobility comments: Verbal cues to move to EOB.  Patient unable to follow commands.  Used bed pads and +2 total assist to move patient to sitting EOB.  Patient with flexed posture, but able to maintain sitting balance with min guard assist.  Eyes closed and patient began leaning posteriorly.  Verbal/tactile stimulation and patient moved self back to upright position.  Returned to supine with +2 total assist.  Transfers                 General transfer comment: NT  Ambulation/Gait                Stairs            Wheelchair Mobility    Modified Rankin (Stroke Patients Only)       Balance Overall balance assessment: Needs assistance;History of Falls Sitting-balance support: Single extremity supported;Feet unsupported Sitting balance-Leahy Scale:  Poor Sitting balance - Comments: Required UE support and external assist.  Was able to reach fair static sitting balance. Postural control: Posterior lean                                   Pertinent Vitals/Pain Pain Assessment: Faces Faces Pain Scale: Hurts little more Pain Location: Patient unable to state. Pain Descriptors / Indicators: Grimacing;Moaning Pain Intervention(s): Limited activity within patient's tolerance;Monitored during session;Repositioned;Ice applied    Home Living Family/patient expects to be discharged to:: Skilled nursing facility                 Additional Comments: Patient is from [facility].    Prior Function Level of Independence: Needs assistance   Gait / Transfers Assistance Needed: Per son, staff at SNF were using lift equipment to move patient bed <> recliner.  ADL's / Homemaking Assistance Needed: Total assist for ADL's        Hand Dominance   Dominant Hand: Right    Extremity/Trunk Assessment   Upper Extremity Assessment Upper Extremity Assessment: Defer to OT evaluation    Lower Extremity Assessment Lower Extremity Assessment: Generalized weakness;LLE deficits/detail LLE Deficits / Details: Decreased ROM and movement post-op.  Pain with movement.    Cervical / Trunk Assessment Cervical / Trunk Assessment: Kyphotic  Communication   Communication: Expressive difficulties (Difficult to understand)  Cognition Arousal/Alertness: Lethargic Behavior During  Therapy: Flat affect (Eyes closed during most of session, even in sitting.) Overall Cognitive Status: Impaired/Different from baseline Area of Impairment: Orientation;Attention;Memory;Following commands;Safety/judgement;Awareness;Problem solving                 Orientation Level: Disoriented to;Place;Time;Situation Current Attention Level: Focused (Decreased arousal) Memory: Decreased short-term memory;Decreased recall of precautions Following Commands:   (Did not follow commands during session) Safety/Judgement: Decreased awareness of deficits;Decreased awareness of safety   Problem Solving: Slow processing;Decreased initiation;Difficulty sequencing;Requires tactile cues;Requires verbal cues General Comments: Patient remained lethargic during session, with cues required to keep eyes open.  Patient unable to answer questions except for her name.        General Comments      Exercises     Assessment/Plan    PT Assessment Patient needs continued PT services  PT Problem List Decreased strength;Decreased activity tolerance;Decreased balance;Decreased mobility;Decreased cognition;Decreased knowledge of use of DME;Decreased safety awareness;Decreased knowledge of precautions;Pain       PT Treatment Interventions DME instruction;Gait training;Functional mobility training;Therapeutic activities;Therapeutic exercise;Balance training;Patient/family education    PT Goals (Current goals can be found in the Care Plan section)  Acute Rehab PT Goals Patient Stated Goal: Son: To return to SNF PT Goal Formulation: With family Time For Goal Achievement: 12/13/16 Potential to Achieve Goals: Fair    Frequency Min 3X/week   Barriers to discharge        Co-evaluation               End of Session Equipment Utilized During Treatment: Oxygen;Left knee immobilizer Activity Tolerance: Patient limited by pain;Patient limited by lethargy Patient left: in bed;with call bell/phone within reach;with bed alarm set;with nursing/sitter in room;with family/visitor present;with SCD's reapplied Nurse Communication: Mobility status PT Visit Diagnosis: Repeated falls (R29.6);Muscle weakness (generalized) (M62.81);Difficulty in walking, not elsewhere classified (R26.2);Pain Pain - Right/Left: Left Pain - part of body: Leg    Time: 1610-9604 PT Time Calculation (min) (ACUTE ONLY): 25 min   Charges:   PT Evaluation $PT Eval Moderate Complexity: 1  Procedure PT Treatments $Therapeutic Activity: 8-22 mins   PT G Codes:        Durenda Hurt. Renaldo Fiddler, [facility] Acute Rehab Services Pager [phone]   Vena Austria 11/29/2016, 3:58 PM

## 2016-11-29 NOTE — Evaluation (Signed)
Occupational Therapy Evaluation Patient Details Name: [name] [name] MRN: [id number] DOB: 1919/03/14 Today's Date: 11/29/2016    History of Present Illness 83 y.o female s/p L hemi arthoplasty. Pt is WBAT with posterior hip precautions Pt with several falls over  Last few weeks at SNF per daughter.     Clinical Impression    Pt admitted with L hip fx.   Pt currently with functional limitations due to the deficits listed below (see OT Problem List).  Pt will benefit from skilled OT to increase their safety and independence with ADL and functional mobility for ADL to facilitate discharge to venue listed below.   Follow Up Recommendations  SNF    Equipment Recommendations  None recommended by OT       Precautions / Restrictions Precautions Precautions: Posterior Hip Precaution Comments: left      Mobility Bed Mobility Overal bed mobility: Needs Assistance             General bed mobility comments: pt groaning with just raising bed up.  did not attempt due to pts groaning as well as BP ( 90/35)RN aware  Transfers                 General transfer comment: did not perform        ADL either performed or assessed with clinical judgement   ADL Overall ADL's : Needs assistance/impaired Eating/Feeding: Bed level;Total assistance   Grooming: Bed level;Total assistance   Upper Body Bathing: Bed level;Total assistance   Lower Body Bathing: Bed level;Total assistance;+2 for physical assistance                         General ADL Comments: pt total A with ADL activity at this time.  Pt was up and walking at facilty and was able to perform ADL activity     Vision Patient Visual Report: Other (comment) (pts daugther reports family feeling like vision has decreased over last few monthes in which they think has contributed to her falls.  Pt with eyes closed most of OT session . Will attempt further vision testing as able) Additional Comments: pts daugther reports  family feeling like vision has decreased over last few monthes in which they think has contributed to her falls.  Pt with eyes closed most of OT session . Will attempt further vision testing as able      Perception     Praxis      Pertinent Vitals/Pain Pain Assessment: Faces Faces Pain Scale: Hurts whole lot Pain Location: pt grimacing and groaning with BUE AAROM Pain Intervention(s): Limited activity within patient's tolerance;Monitored during session;Premedicated before session;Repositioned        Extremity/Trunk Assessment Upper Extremity Assessment Upper Extremity Assessment: Generalized weakness;RUE deficits/detail;LUE deficits/detail RUE Deficits / Details: AAROM WFL- pt groaned with shoulder ROM. Daugther reports several falls in last few weeks in which pt is likely sore as well as arthritis.  LUE Deficits / Details: AAROM WFL- pt groaned with shoulder ROM. Daugther reports several falls in last few weeks in which pt is likely sore as well as arthritis.    Lower Extremity Assessment Lower Extremity Assessment: Generalized weakness       Communication     Cognition Arousal/Alertness: Lethargic   Overall Cognitive Status: Impaired/Different from baseline  General Comments: pt very lethargic during OT session. Pt did not follow any commands.        Exercises  Educated daughter on positioning of BUe on pillows in bed to A with joint protection and pain as well as gentle PROM/ AAROM        Home Living Family/patient expects to be discharged to:: Skilled nursing facility                                                 OT Problem List: Decreased strength;Decreased activity tolerance;Decreased knowledge of use of DME or AE;Pain;Decreased knowledge of precautions;Impaired balance (sitting and/or standing);Decreased safety awareness      OT Treatment/Interventions: Self-care/ADL training;DME and/or AE  instruction;Patient/family education;Therapeutic exercise;Therapeutic activities    OT Goals(Current goals can be found in the care plan section) Acute Rehab OT Goals Patient Stated Goal: go back to SNF OT Goal Formulation: With family Time For Goal Achievement: 12/13/16 Potential to Achieve Goals: Fair  OT Frequency: Min 2X/week              End of Session Nurse Communication: Mobility status;Other (comment) (pts BP and how session went)  Activity Tolerance: Patient limited by lethargy;Patient limited by pain Patient left: in bed;with call bell/phone within reach;with family/visitor present  OT Visit Diagnosis: Muscle weakness (generalized) (M62.81);History of falling (Z91.81);Pain;Other abnormalities of gait and mobility (R26.89) Pain - Right/Left: Left Pain - part of body: Hip                Time: 1610-9604 OT Time Calculation (min): 19 min Charges:  OT General Charges $OT Visit: 1 Procedure OT Evaluation $OT Eval Moderate Complexity: 1 Procedure G-Codes:     Lise Auer, OT [phone]  Einar Crow D 11/29/2016, 11:46 AM

## 2016-11-29 NOTE — Progress Notes (Addendum)
PROGRESS NOTE   [name] [name]  ZOX:[id number]    DOB: Mar 09, 1919    DOA: 11/27/2016  PCP: Merrilee Seashore, MD   I have briefly reviewed patients previous medical records in [facility].  Brief Narrative:  83 year old female, SNF resident, with PMH of CAD, dementia, HTN, HLD, hypothyroid presented to ED with complaints of left hip pain after falls. CT confirmed left subcapital femur fracture. Orthopedics was consulted and she underwent left hip hemiarthroplasty on 11/28/16.   Assessment & Plan:   Principal Problem:   Closed left hip fracture (HCC) Active Problems:   CAD (coronary artery disease)   Essential hypertension, benign   Anemia   OA (osteoarthritis)   HLD (hyperlipidemia)   GERD (gastroesophageal reflux disease)   CKD (chronic kidney disease) stage 3, GFR 30-59 ml/min   Hx pulmonary embolism   Hypothyroidism   Hip fracture (HCC)   Dementia   Pressure injury of skin   1. Left femoral neck fracture, status post hemiarthroplasty 11/28/16: Hip fracture sustained status post fall and possible UTI. Orthopedics was consulted and patient underwent surgery. Management per orthopedics>DVT prophylaxis: SCDs, ambulation, Lovenox; WBAT on LLE, posterior hip precautions, pain control and possible DC SNF 12/01/16. Patient was seen along with Dr. Roda Shutters, Orthopedics. Discussed home antiplatelets. Advised DC Lovenox and resume aspirin + Plavix from 4/8 which she was on PTA which should suffice for DVT prophylaxis. 2. Possible UTI: No history available from patient. Urine microscopy suspicious for UTI. IV Rocephin (tolerated preoperative cefazolin) pending urine culture results. 3. Anemia: Expected postop acute blood loss anemia. Follow CBC in a.m. Transfuse if hemoglobin <7 g per DL. 4. Stage III chronic kidney disease: Creatinine at baseline. Follow BMP periodically. 5. Essential hypertension/hypotension: SBP is in the 80s this morning.? Hypovolemia. DC metoprolol temporarily. Improved after IV NS  500 mL bolus 1, to SBP of 90s. Continue gentle IV fluid hydration. 6. CAD status post stent: No reported chest pain. EKG without acute changes. Home aspirin and Plavix held for surgery, resume when okay with orthopedics. Metoprolol held due to hypotension  7. Advanced dementia: Mental status at baseline per family. 8. Ataxia and falls: Secondary to advanced age, dementia and unsteady gait. PT evaluation. 9. Hypothyroid: Continue Synthroid. 10. Hyperglycemia: Monitor CBGs and if persistently elevated, consider SSI.   DVT prophylaxis: Lovenox Code Status: DO NOT RESUSCITATE Family Communication: Discussed with youngest daughter at bedside. Updated care and answered questions. Disposition: DC to SNF when medically improved and cleared by orthopedics   Consultants:  Orthopedics   Procedures:  Left hip hemiarthroplasty 11/28/16 Foley catheter-to be removed 12/03/16.  Antimicrobials:  Ceftriaxone    Subjective: Patient seen this morning. Daughter at bedside. Had woken up and alert. Mental status likely at baseline. As per daughter, patient has been at the current nursing facility for approximately 5 years since the death of her spouse. Patient has been physically and mentally declining over the last year. She has sustained at least 3 falls in the last month. Also has visual impairment. Apparently recently told to have UTI 2-3 weeks ago but not sure if she was treated at the SNF. No complaints reported by patient.  ROS: Unable to obtain  Objective:  Vitals:   11/28/16 2328 11/29/16 0529 11/29/16 0931 11/29/16 1129  BP: (!) 143/89 (!) 91/43 (!) 86/36 (!) 95/35  Pulse: 74 61 76 72  Resp: (!) Temp: 98.2 F (36.8 C) 98.2 F (36.8 C) 98.4 F (36.9 C)   TempSrc:  Axillary Oral Oral   SpO2: 100% 100% 99%   Weight:        Examination:  General exam: Pleasant elderly female, frail, sitting up in bed comfortably. Respiratory system: Clear to auscultation. Respiratory effort  normal. Mouth breathing. Cardiovascular system: S1 & S2 heard, RRR. No JVD, murmurs, rubs, gallops or clicks. No pedal edema. Gastrointestinal system: Abdomen is nondistended, soft and nontender. No organomegaly or masses felt. Normal bowel sounds heard. Central nervous system: Alert and recognizes her daughter. Does not follow instructions. No focal neurological deficits. Extremities: Spontaneously & symmetrically moves upper extremities. Left hip surgical site clean and dry. Left lower extremity in splint. Peripheral pulses symmetrically felt. Skin: No rashes, lesions or ulcers Psychiatry: Unable to assess.    Data Reviewed: I have personally reviewed following labs and imaging studies  CBC:  Recent Labs Lab 11/27/16 1123 11/28/16 0539 11/28/16 1251 11/29/16 0225  WBC 9.6 8.9 16.8* 10.0  NEUTROABS 6.2  --   --   --   HGB 11.1* 10.9* 10.9* 9.5*  HCT 35.0* 34.7* 34.9* 31.1*  MCV 97.0 98.9 98.6 97.8  PLT 220 240 255 264   Basic Metabolic Panel:  Recent Labs Lab 11/27/16 1200 11/28/16 0539 11/28/16 1251 11/29/16 0225  NA 135 138  --  136  K 3.9 4.4  --  4.7  CL 101 98*  --  97*  CO2 24 32  --  26  GLUCOSE 102* 95  --  214*  BUN 32* 27*  --  30*  CREATININE 1.66* 1.51* 1.48* 1.67*  CALCIUM 9.0 9.2  --  8.8*   Liver Function Tests:  Recent Labs Lab 11/27/16 1200  AST 22  ALT 14  ALKPHOS 78  BILITOT 0.3  PROT 6.7  ALBUMIN 2.8*   Coagulation Profile:  Recent Labs Lab 11/27/16 1123 11/28/16 0539  INR 1.11 1.15     Recent Results (from the past 240 hour(s))  Surgical pcr screen     Status: None   Collection Time: 11/27/16  9:57 PM  Result Value Ref Range Status   MRSA, PCR NEGATIVE NEGATIVE Final   Staphylococcus aureus NEGATIVE NEGATIVE Final    Comment:        The Xpert SA Assay (FDA approved for NASAL specimens in patients over 81 years of age), is one component of a comprehensive surveillance program.  Test performance has been validated by  [facility] for patients greater than or equal to 87 year old. It is not intended to diagnose infection nor to guide or monitor treatment.          Radiology Studies: Dg Chest 1 View  Result Date: 11/27/2016 CLINICAL DATA:  Hypoxia EXAM: CHEST 1 VIEW COMPARISON:  Diarrhea 16 2018 and August 24, 2015 FINDINGS: There is no edema or consolidation. There is apparent epicardial fat prominence along the lateral heart border on the left, unchanged. Heart is mildly prominent with pulmonary vascularity within normal limits, stable. There is aortic atherosclerosis. No adenopathy. No bone lesions. IMPRESSION: No edema or consolidation. Prominent epicardial fat on the left laterally. Stable cardiac silhouette. There is aortic atherosclerosis. Electronically Signed   By: Bretta Bang III M.D.   On: 11/27/2016 11:56   Pelvis Portable  Result Date: 11/28/2016 CLINICAL DATA:  Status post left hip replacement today. The patient suffered an impacted subcapital fracture of the left femoral neck due to a fall 11/27/2016. EXAM: PORTABLE PELVIS 1-2 VIEWS COMPARISON:  CT left hip and plain films left hip 11/27/2016. FINDINGS:  New bipolar left hip hemiarthroplasty is identified. The device is located. No fracture is identified. Surgical staples are noted. Old right hip replacement is noted. IMPRESSION: Status post left hip replacement today without evidence of complication. No acute abnormality. Electronically Signed   By: Drusilla Kanner M.D.   On: 11/28/2016 11:36   Ct Hip Left Wo Contrast  Result Date: 11/27/2016 CLINICAL DATA:  Left hip pain status post fall. EXAM: CT OF THE LEFT HIP WITHOUT CONTRAST TECHNIQUE: Multidetector CT imaging of the left hip was performed according to the standard protocol. Multiplanar CT image reconstructions were also generated. COMPARISON:  None. FINDINGS: Bones/Joint/Cartilage Generalized osteopenia. Impacted, nondisplaced subcapital left femoral neck fracture. No other  fracture or dislocation. Normal alignment. No joint effusion. No lytic or sclerotic osseous lesion. Ligaments Ligaments are suboptimally evaluated by CT. Muscles and Tendons Generalized muscle atrophy. No intramuscular fluid collection or hematoma. Soft tissue No fluid collection or hematoma. No soft tissue mass. Peripheral vascular atherosclerotic disease. IMPRESSION: 1. Impacted, nondisplaced subcapital left femoral neck fracture. Electronically Signed   By: Elige Ko   On: 11/27/2016 14:05   Dg Hip Unilat With Pelvis 2-3 Views Left  Result Date: 11/27/2016 CLINICAL DATA:  Left hip pain after fall. EXAM: DG HIP (WITH OR WITHOUT PELVIS) 2-3V LEFT COMPARISON:  Radiographs of November 12, 2016. FINDINGS: Status post right hip arthroplasty. No definite dislocation is noted. Irregularity is seen involving the trochanteric region of the proximal left femur concerning for possible fracture. IMPRESSION: Irregularity seen involving the trochanteric region of proximal left femur concerning for possible fracture. CT or MRI scan is recommended for further evaluation. Electronically Signed   By: Lupita Raider, M.D.   On: 11/27/2016 11:59        Scheduled Meds: . cefTRIAXone (ROCEPHIN)  IV  1 g Intravenous Q24H  . chlorhexidine  60 mL Topical Once  . docusate sodium  100 mg Oral BID  . dorzolamide-timolol  1 drop Both Eyes Q12H  . enoxaparin (LOVENOX) injection  30 mg Subcutaneous Q24H  . escitalopram  5 mg Oral Daily  . latanoprost  1 drop Both Eyes QHS  . levothyroxine  50 mcg Oral QAC breakfast  . pantoprazole  40 mg Oral Daily  . povidone-iodine  2 application Topical Once   Continuous Infusions: . sodium chloride    . lactated ringers Stopped (11/28/16 1300)     LOS: 2 days     [name] Sheaffer, MD, FACP, FHM. [facility] Pager [phone]  If 7PM-7AM, please contact night-coverage [email or web address] Password [facility] 11/29/2016, 11:48 AM

## 2016-11-29 NOTE — Progress Notes (Signed)
   Subjective:  Patient is sleepy.  NAD  Objective:   VITALS:   Vitals:   11/28/16 1705 11/28/16 2040 11/28/16 2328 11/29/16 0529  BP: 108/61  (!) 143/89 (!) 91/43  Pulse: 72  74 61  Resp: (!) 21  (!) 22 20  Temp: 97.5 F (36.4 C)  98.2 F (36.8 C) 98.2 F (36.8 C)  TempSrc: Axillary  Axillary Oral  SpO2: 98%  100% 100%  Weight:  172 lb 6.4 oz (78.2 kg)      Neurovascular intact Sensation intact distally Intact pulses distally Dorsiflexion/Plantar flexion intact Incision: dressing C/D/I and no drainage No cellulitis present Compartment soft   Lab Results  Component Value Date   WBC 10.0 11/29/2016   HGB 9.5 (L) 11/29/2016   HCT 31.1 (L) 11/29/2016   MCV 97.8 11/29/2016   PLT 264 11/29/2016     Assessment/Plan:  1 Day Post-Op   - Expected postop acute blood loss anemia - will monitor for symptoms - Up with PT/OT - DVT ppx - SCDs, ambulation, lovenox - WBAT operative extremity, posterior hip precautions - Pain control - Discharge planning - SNF Monday likely  Glee Arvin 11/29/2016, 8:47 AM [phone]

## 2016-11-30 DIAGNOSIS — N179 Acute kidney failure, unspecified: Secondary | ICD-10-CM

## 2016-11-30 LAB — CBC
HCT: 26.2 % — ABNORMAL LOW (ref 36.0–46.0)
HEMOGLOBIN: 8.2 g/dL — AB (ref 12.0–15.0)
MCH: 30.4 pg (ref 26.0–34.0)
MCHC: 31.3 g/dL (ref 30.0–36.0)
MCV: 97 fL (ref 78.0–100.0)
PLATELETS: 226 10*3/uL (ref 150–400)
RBC: 2.7 MIL/uL — ABNORMAL LOW (ref 3.87–5.11)
RDW: 13.4 % (ref 11.5–15.5)
WBC: 9.9 10*3/uL (ref 4.0–10.5)

## 2016-11-30 LAB — CBC AND DIFFERENTIAL
HCT: 26 % — AB (ref 36–46)
HEMOGLOBIN: 8.2 g/dL — AB (ref 12.0–16.0)
PLATELETS: 226 10*3/uL (ref 150–399)
WBC: 9.9 10*3/mL

## 2016-11-30 LAB — URINE CULTURE

## 2016-11-30 LAB — GLUCOSE, CAPILLARY
GLUCOSE-CAPILLARY: 94 mg/dL (ref 65–99)
GLUCOSE-CAPILLARY: 142 mg/dL — AB (ref 65–99)
GLUCOSE-CAPILLARY: 107 mg/dL — AB (ref 65–99)
Glucose-Capillary: 93 mg/dL (ref 65–99)

## 2016-11-30 LAB — BASIC METABOLIC PANEL
Anion gap: 10 (ref 5–15)
BUN: 38 mg/dL — AB (ref 4–21)
BUN: 38 mg/dL — AB (ref 6–20)
CALCIUM: 8.4 mg/dL — AB (ref 8.9–10.3)
CHLORIDE: 100 mmol/L — AB (ref 101–111)
CO2: 23 mmol/L (ref 22–32)
Creatinine, Ser: 1.89 mg/dL — ABNORMAL HIGH (ref 0.44–1.00)
Creatinine: 1.9 mg/dL — AB (ref 0.5–1.1)
GFR calc Af Amer: 25 mL/min — ABNORMAL LOW (ref >60)
GFR calc non Af Amer: 21 mL/min — ABNORMAL LOW (ref >60)
Glucose, Bld: 86 mg/dL (ref 65–99)
Glucose: 86 mg/dL
Potassium: 3.9 mmol/L (ref 3.4–5.3)
Potassium: 3.9 mmol/L (ref 3.5–5.1)
SODIUM: 133 mmol/L — AB (ref 135–145)
Sodium: 133 mmol/L — AB (ref 137–147)

## 2016-11-30 MED ORDER — SODIUM CHLORIDE 0.9 % IV SOLN
INTRAVENOUS | Status: AC
  Administered 2016-11-30: 14:00:00 via INTRAVENOUS

## 2016-11-30 NOTE — [place] FL2 (Signed)
[place] MEDICAID FL2 LEVEL OF CARE SCREENING TOOL     IDENTIFICATION  Patient Name: [name] [name] Birthdate: [birth year] Sex: female Admission Date (Current Location): 11/27/2016  [facility] and [place] Number:  Producer, television/film/video and Address:  [facility], [address], [place], [place] [address]      Provider Number: [id number]  Attending Physician Name and Address:  Elease Etienne, MD  Relative Name and Phone Number:       Current Level of Care: Hospital Recommended Level of Care: Skilled Nursing Facility Prior Approval Number:    Date Approved/Denied:   PASRR Number:    Discharge Plan: SNF    Current Diagnoses: Patient Active Problem List   Diagnosis Date Noted  . Pressure injury of skin 11/28/2016  . Hip fracture (HCC) 11/27/2016  . Dementia   . Hypothyroidism 05/04/2016  . Klebsiella pneumoniae sepsis (HCC) 12/01/2015  . Hx pulmonary embolism 12/01/2015  . Palliative care encounter   . Pain in the chest   . Weakness   . Acute on chronic renal failure (HCC) 11/21/2015  . Acute respiratory failure with hypoxia (HCC) 11/21/2015  . CKD (chronic kidney disease) stage 3, GFR 30-59 ml/min 11/18/2015  . E. coli UTI (urinary tract infection) 08/28/2015  . GERD (gastroesophageal reflux disease) 08/28/2015  . Klebsiella cystitis 08/24/2015  . ARF (acute renal failure) (HCC) 08/24/2015  . Cough 09/24/2014  . Pain in joint, ankle and foot 04/15/2014  . Cellulitis 04/10/2014  . HLD (hyperlipidemia) 12/22/2013  . Renal insufficiency 11/06/2013  . Chest pain 10/26/2013  . Epigastric pain 10/26/2013  . OA (osteoarthritis) 10/03/2013  . Edema 03/03/2013  . Essential hypertension, benign 01/05/2013  . Anemia 01/05/2013  . Glaucoma 06/06/2012  . Closed left hip fracture (HCC) 06/05/2012  . CAD (coronary artery disease) 06/05/2012    Orientation RESPIRATION BLADDER Height & Weight     Self, Place  O2 (2L [place]) Incontinent Weight: 169 lb  12.1 oz (77 kg) Height:   (157.5 cm)  BEHAVIORAL SYMPTOMS/MOOD NEUROLOGICAL BOWEL NUTRITION STATUS      Incontinent Diet (cardiac)  AMBULATORY STATUS COMMUNICATION OF NEEDS Skin   Total Care Verbally PU Stage and Appropriate Care, Surgical wounds PU Stage 1 Dressing:  (on left heel, foam dressing change q 5 days)                     Personal Care Assistance Level of Assistance  Bathing, Dressing, Total care, Feeding Bathing Assistance: Maximum assistance Feeding assistance: Limited assistance Dressing Assistance: Maximum assistance     Functional Limitations Info             SPECIAL CARE FACTORS FREQUENCY                       Contractures      Additional Factors Info  Code Status, Allergies, Isolation Precautions Code Status Info: DNR Allergies Info: Codeine, Morphine And Related, Penicillins     Isolation Precautions Info: MRSA     Current Medications (11/30/2016):  This is the current hospital active medication list Current Facility-Administered Medications  Medication Dose Route Frequency Provider Last Rate Last Dose  . 0.9 %  sodium chloride infusion   Intravenous Continuous Elease Etienne, MD 60 mL/hr at 11/30/16 0715    . acetaminophen (TYLENOL) tablet 650 mg  650 mg Oral Q6H PRN Eldred Manges, MD   650 mg at 11/30/16 0730   Or  .  acetaminophen (TYLENOL) suppository 650 mg  650 mg Rectal Q6H PRN Eldred Manges, MD      . aspirin chewable tablet 81 mg  81 mg Oral Daily Elease Etienne, MD   81 mg at 11/30/16 0953  . bisacodyl (DULCOLAX) suppository 10 mg  10 mg Rectal PRN Gwenyth Bender, NP      . cefTRIAXone (ROCEPHIN) 1 g in dextrose 5 % 50 mL IVPB  1 g Intravenous Q24H Elease Etienne, MD   1 g at 11/29/16 1731  . chlorhexidine (HIBICLENS) 4 % liquid 4 application  60 mL Topical Once Eldred Manges, MD      . clopidogrel (PLAVIX) tablet 75 mg  75 mg Oral Daily Elease Etienne, MD   75 mg at 11/30/16 0951  . docusate sodium (COLACE) capsule 100 mg   100 mg Oral BID Gwenyth Bender, NP   100 mg at 11/30/16 1610  . dorzolamide-timolol (COSOPT) 22.3-6.8 MG/ML ophthalmic solution 1 drop  1 drop Both Eyes Q12H Gwenyth Bender, NP   1 drop at 11/30/16 0954  . escitalopram (LEXAPRO) tablet 5 mg  5 mg Oral Daily Gwenyth Bender, NP   5 mg at 11/30/16 9604  . HYDROcodone-acetaminophen (NORCO/VICODIN) 5-325 MG per tablet 1-2 tablet  1-2 tablet Oral Q6H PRN Eldred Manges, MD   2 tablet at 11/29/16 0800  . latanoprost (XALATAN) 0.005 % ophthalmic solution 1 drop  1 drop Both Eyes QHS Gwenyth Bender, NP   1 drop at 11/29/16 2144  . levothyroxine (SYNTHROID, LEVOTHROID) tablet 50 mcg  50 mcg Oral QAC breakfast Gwenyth Bender, NP   50 mcg at 11/30/16 0730  . magnesium hydroxide (MILK OF MAGNESIA) suspension 30 mL  30 mL Oral Daily PRN Lesle Chris Black, NP      . menthol-cetylpyridinium (CEPACOL) lozenge 3 mg  1 lozenge Oral PRN Eldred Manges, MD       Or  . phenol (CHLORASEPTIC) mouth spray 1 spray  1 spray Mouth/Throat PRN Eldred Manges, MD      . methocarbamol (ROBAXIN) tablet 500 mg  500 mg Oral Q6H PRN Gwenyth Bender, NP   500 mg at 11/29/16 1403   Or  . methocarbamol (ROBAXIN) 500 mg in dextrose 5 % 50 mL IVPB  500 mg Intravenous Q6H PRN Gwenyth Bender, NP   500 mg at 11/29/16 0130  . metoCLOPramide (REGLAN) tablet 5-10 mg  5-10 mg Oral Q8H PRN Eldred Manges, MD       Or  . metoCLOPramide (REGLAN) injection 5-10 mg  5-10 mg Intravenous Q8H PRN Eldred Manges, MD      . morphine 4 MG/ML injection 0.52 mg  0.52 mg Intravenous Q2H PRN Gwenyth Bender, NP      . ondansetron [facility]) tablet 4 mg  4 mg Oral Q6H PRN Eldred Manges, MD       Or  . ondansetron [facility]) injection 4 mg  4 mg Intravenous Q6H PRN Eldred Manges, MD      . pantoprazole (PROTONIX) EC tablet 40 mg  40 mg Oral Daily Gwenyth Bender, NP   40 mg at 11/30/16 0953  . povidone-iodine 10 % swab 2 application  2 application Topical Once Eldred Manges, MD      . sodium phosphate (FLEET) 7-19 GM/118ML enema 1 enema  1  enema Rectal Once PRN Gwenyth Bender, NP  Discharge Medications: Please see discharge summary for a list of discharge medications.  Relevant Imaging Results:  Relevant Lab Results:   Additional Information SS#: [id number]  Burna Sis, LCSW

## 2016-11-30 NOTE — Progress Notes (Signed)
PROGRESS NOTE   [name] [name]  WUJ:[id number]    DOB: 12/07/1918    DOA: 11/27/2016  PCP: Merrilee Seashore, MD   I have briefly reviewed patients previous medical records in [facility].  Brief Narrative:  83 year old female, SNF resident, with PMH of CAD, dementia, HTN, HLD, hypothyroid presented to ED with complaints of left hip pain after falls. CT confirmed left subcapital femur fracture. Orthopedics was consulted and she underwent left hip hemiarthroplasty on 11/28/16.   Assessment & Plan:   Principal Problem:   Closed left hip fracture (HCC) Active Problems:   CAD (coronary artery disease)   Essential hypertension, benign   Anemia   OA (osteoarthritis)   HLD (hyperlipidemia)   GERD (gastroesophageal reflux disease)   CKD (chronic kidney disease) stage 3, GFR 30-59 ml/min   Hx pulmonary embolism   Hypothyroidism   Hip fracture (HCC)   Dementia   Pressure injury of skin   1. Left femoral neck fracture, status post hemiarthroplasty 11/28/16: Hip fracture sustained status post fall and possible UTI. Orthopedics was consulted and patient underwent surgery. Management per orthopedics>DVT prophylaxis: SCDs, ambulation, Lovenox; WBAT on LLE, posterior hip precautions, pain control and possible DC SNF 12/01/16. Patient was seen along with Dr. Roda Shutters, Orthopedics. Discussed home antiplatelets. Advised DC Lovenox and resume aspirin + Plavix from 4/8 which she was on PTA which should suffice for DVT prophylaxis. 2. Possible Escherichia coli UTI: No history available from patient. Urine microscopy suspicious for UTI. IV Rocephin (tolerated preoperative cefazolin)-sensitive. Urine culture only shows 10k colonies of Escherichia coli. Complete 3 days course of IV Rocephin today then discontinue. 3. Anemia: Expected postop acute blood loss anemia. Hemoglobin dropped gradually from 10.9 preoperatively to postop 9.5 > 8.2, expected. Transfuse if hemoglobin <7 g per DL. Follow CBC. 4. Acute on Stage III  chronic kidney disease: Creatinine worsened from 1.6-1.89.? Volume depletion versus transient ATN from hypotension yesterday. IV fluids and follow BMP in a.m. 5. Essential hypertension/transient hypotension: SBP is in the 80s 8/7 morning.? Hypovolemia. DC metoprolol temporarily. Blood pressures normalized after IV fluids. 6. CAD status post stent: No reported chest pain. EKG without acute changes. Home aspirin and Plavix held for surgery, resume when okay with orthopedics. Metoprolol held due to hypotension  7. Advanced dementia: Mental status at baseline per family. 8. Ataxia and falls: Secondary to advanced age, dementia and unsteady gait. PT evaluation >SNF. 9. Hypothyroid: Continue Synthroid. 10. Hyperglycemia: Monitor CBGs >no concerns. DC CBGs.   DVT prophylaxis: Lovenox Code Status: DO NOT RESUSCITATE Family Communication: None at bedside Disposition: DC to SNF possibly 4/9 pending improvement in creatinine, stable hemoglobin and SNF bed availability.   Consultants:  Orthopedics   Procedures:  Left hip hemiarthroplasty 11/28/16 Foley catheter-to be removed 12/03/16.  Antimicrobials:  Ceftriaxone    Subjective: Patient seen along with nursing tech who was feeding patient. Patient stated "I am all right". Complains of left hip postop site pain upon coughing. As per RN, Foley removed yesterday and voiding well. Mental status close to baseline.  ROS: Unable to obtain  Objective:  Vitals:   11/29/16 1851 11/29/16 2135 11/30/16 0455 11/30/16 0934  BP:  (!) 100/44 (!) 117/47 (!) 125/46  Pulse:  75 67 73  Resp:  Temp:  98.7 F (37.1 C) 98.2 F (36.8 C) 98 F (36.7 C)  TempSrc:  Oral Oral Oral  SpO2:  93% 100% 99%  Weight:   77 kg (169 lb 12.1 oz)   Height:   (1.575 m)       Examination:  General exam: Pleasant elderly female, frail, sitting up in bed comfortably and eating breakfast. Respiratory system: Clear to auscultation. Respiratory effort normal.  Mouth breathing. Cardiovascular system: S1 & S2 heard, RRR. No JVD, murmurs, rubs, gallops or clicks. No pedal edema. Gastrointestinal system: Abdomen is nondistended, soft and nontender. No organomegaly or masses felt. Normal bowel sounds heard. Central nervous system: Alert and oriented only to self. Follow simple instructions. No focal neurological deficits. Extremities: Spontaneously & symmetrically moves upper extremities. Left hip surgical site clean and dry. Left lower extremity in splint. Peripheral pulses symmetrically felt. Skin: No rashes, lesions or ulcers Psychiatry: Judgment impaired.    Data Reviewed: I have personally reviewed following labs and imaging studies  CBC:  Recent Labs Lab 11/27/16 1123 11/28/16 0539 11/28/16 1251 11/29/16 0225 11/30/16 0354  WBC 9.6 8.9 16.8* 10.0 9.9  NEUTROABS 6.2  --   --   --   --   HGB 11.1* 10.9* 10.9* 9.5* 8.2*  HCT 35.0* 34.7* 34.9* 31.1* 26.2*  MCV 97.0 98.9 98.6 97.8 97.0  PLT 220 240 255 264 226   Basic Metabolic Panel:  Recent Labs Lab 11/27/16 1200 11/28/16 0539 11/28/16 1251 11/29/16 0225 11/30/16 0354  NA 135 138  --  136 133*  K 3.9 4.4  --  4.7 3.9  CL 101 98*  --  97* 100*  CO2 24 32  --  26 23  GLUCOSE 102* 95  --  214* 86  BUN 32* 27*  --  30* 38*  CREATININE 1.66* 1.51* 1.48* 1.67* 1.89*  CALCIUM 9.0 9.2  --  8.8* 8.4*   Liver Function Tests:  Recent Labs Lab 11/27/16 1200  AST 22  ALT 14  ALKPHOS 78  BILITOT 0.3  PROT 6.7  ALBUMIN 2.8*   Coagulation Profile:  Recent Labs Lab 11/27/16 1123 11/28/16 0539  INR 1.11 1.15     Recent Results (from the past 240 hour(s))  Surgical pcr screen     Status: None   Collection Time: 11/27/16  9:57 PM  Result Value Ref Range Status   MRSA, PCR NEGATIVE NEGATIVE Final   Staphylococcus aureus NEGATIVE NEGATIVE Final    Comment:        The Xpert SA Assay (FDA approved for NASAL specimens in patients over 46 years of age), is one component  of a comprehensive surveillance program.  Test performance has been validated by [facility] for patients greater than or equal to 28 year old. It is not intended to diagnose infection nor to guide or monitor treatment.   Culture, Urine     Status: Abnormal   Collection Time: 11/28/16  5:24 PM  Result Value Ref Range Status   Specimen Description URINE, CATHETERIZED  Final   Special Requests NONE  Final   Culture 10,000 COLONIES/mL ESCHERICHIA COLI (A)  Final   Report Status 11/30/2016 FINAL  Final   Organism ID, Bacteria ESCHERICHIA COLI (A)  Final      Susceptibility   Escherichia coli - MIC*    AMPICILLIN 4 SENSITIVE Sensitive     CEFAZOLIN <=4 SENSITIVE Sensitive     CEFTRIAXONE <=1 SENSITIVE Sensitive     CIPROFLOXACIN >=4 RESISTANT Resistant     GENTAMICIN <=1 SENSITIVE Sensitive     IMIPENEM <=0.25 SENSITIVE Sensitive     NITROFURANTOIN <=16 SENSITIVE Sensitive     TRIMETH/SULFA <=20 SENSITIVE Sensitive     AMPICILLIN/SULBACTAM 4 SENSITIVE Sensitive  PIP/TAZO <=4 SENSITIVE Sensitive     Extended ESBL NEGATIVE Sensitive     * 10,000 COLONIES/mL ESCHERICHIA COLI         Radiology Studies: No results found.      Scheduled Meds: . aspirin  81 mg Oral Daily  . cefTRIAXone (ROCEPHIN)  IV  1 g Intravenous Q24H  . chlorhexidine  60 mL Topical Once  . clopidogrel  75 mg Oral Daily  . docusate sodium  100 mg Oral BID  . dorzolamide-timolol  1 drop Both Eyes Q12H  . escitalopram  5 mg Oral Daily  . latanoprost  1 drop Both Eyes QHS  . levothyroxine  50 mcg Oral QAC breakfast  . pantoprazole  40 mg Oral Daily  . povidone-iodine  2 application Topical Once   Continuous Infusions: . sodium chloride 60 mL/hr at 11/30/16 0715     LOS: 3 days     Abednego Yeates, MD, FACP, FHM. [facility] Pager [phone]  If 7PM-7AM, please contact night-coverage [email or web address] Password [email or web address] 11/30/2016, 11:47 AM

## 2016-12-01 ENCOUNTER — Encounter: Payer: Self-pay | Admitting: Internal Medicine

## 2016-12-01 ENCOUNTER — Encounter (HOSPITAL_COMMUNITY): Payer: Self-pay | Admitting: Orthopaedic Surgery

## 2016-12-01 ENCOUNTER — Telehealth (INDEPENDENT_AMBULATORY_CARE_PROVIDER_SITE_OTHER): Payer: Self-pay | Admitting: Orthopaedic Surgery

## 2016-12-01 LAB — BASIC METABOLIC PANEL
ANION GAP: 7 (ref 5–15)
BUN: 28 mg/dL — ABNORMAL HIGH (ref 6–20)
BUN: 28 mg/dL — AB (ref 4–21)
CALCIUM: 8.6 mg/dL — AB (ref 8.9–10.3)
CHLORIDE: 102 mmol/L (ref 101–111)
CO2: 27 mmol/L (ref 22–32)
Creatinine, Ser: 1.32 mg/dL — ABNORMAL HIGH (ref 0.44–1.00)
Creatinine: 1.3 mg/dL — AB (ref 0.5–1.1)
GFR calc Af Amer: 38 mL/min — ABNORMAL LOW (ref >60)
GFR, EST NON AFRICAN AMERICAN: 33 mL/min — AB (ref >60)
GLUCOSE: 96 mg/dL
GLUCOSE: 96 mg/dL (ref 65–99)
POTASSIUM: 4.2 mmol/L (ref 3.5–5.1)
SODIUM: 136 mmol/L — AB (ref 137–147)
Sodium: 136 mmol/L (ref 135–145)

## 2016-12-01 LAB — CBC
HEMATOCRIT: 27.6 % — AB (ref 36.0–46.0)
HEMOGLOBIN: 8.7 g/dL — AB (ref 12.0–15.0)
MCH: 30.7 pg (ref 26.0–34.0)
MCHC: 31.5 g/dL (ref 30.0–36.0)
MCV: 97.5 fL (ref 78.0–100.0)
Platelets: 268 10*3/uL (ref 150–400)
RBC: 2.83 MIL/uL — AB (ref 3.87–5.11)
RDW: 13.5 % (ref 11.5–15.5)
WBC: 9.1 10*3/uL (ref 4.0–10.5)

## 2016-12-01 LAB — GLUCOSE, CAPILLARY
GLUCOSE-CAPILLARY: 104 mg/dL — AB (ref 65–99)
Glucose-Capillary: 98 mg/dL (ref 65–99)

## 2016-12-01 LAB — CBC AND DIFFERENTIAL: WBC: 9.1 10^3/mL

## 2016-12-01 MED ORDER — TRAMADOL HCL 50 MG PO TABS: 50.0000 mg | ORAL_TABLET | Freq: Four times a day (QID) | ORAL | 0 refills | Status: DC | PRN

## 2016-12-01 MED ORDER — DOCUSATE SODIUM 100 MG PO CAPS
100.0000 mg | ORAL_CAPSULE | Freq: Two times a day (BID) | ORAL | Status: AC
Start: 2016-12-01 — End: ?

## 2016-12-01 MED ORDER — HYDROCODONE-ACETAMINOPHEN 5-325 MG PO TABS: 1.0000 | ORAL_TABLET | Freq: Four times a day (QID) | ORAL | 0 refills | Status: AC | PRN

## 2016-12-01 MED ORDER — ACETAMINOPHEN 325 MG PO TABS: 650.0000 mg | ORAL_TABLET | Freq: Four times a day (QID) | ORAL | Status: AC | PRN

## 2016-12-01 NOTE — Telephone Encounter (Signed)
I left voicemail for Osborne Casco to return my call so that I can get a little more information to pass on to Dr. Ophelia Charter.

## 2016-12-01 NOTE — Discharge Summary (Signed)
Physician Discharge Summary  YAREXI PAWLICKI WUJ:[id number] DOB: 1919/07/15  PCP: Merrilee Seashore, MD  Admit date: 11/27/2016 Discharge date: 12/01/2016  Recommendations for Outpatient Follow-up:  1. M.D. at SNF in 2 days with repeat labs (CBC & BMP). 2. Dr. Annell Greening, Orthopedics in 2 weeks for postop follow-up. 3. Dr. Merrilee Seashore, PCP upon discharge from SNF.  Home Health: None Equipment/Devices: None    Discharge Condition: Improved and stable  CODE STATUS: DO NOT RESUSCITATE  Diet recommendation: Heart healthy diet.  Discharge Diagnoses:  Principal Problem:   Closed left hip fracture (HCC) Active Problems:   CAD (coronary artery disease)   Essential hypertension, benign   Anemia   OA (osteoarthritis)   HLD (hyperlipidemia)   GERD (gastroesophageal reflux disease)   CKD (chronic kidney disease) stage 3, GFR 30-59 ml/min   Hx pulmonary embolism   Hypothyroidism   Hip fracture (HCC)   Dementia   Pressure injury of skin   Brief Summary: 83 year old female, SNF resident, with PMH of CAD, advanced dementia, HTN, HLD, hypothyroid presented to ED with complaints of left hip pain after falls. CT confirmed left subcapital femur fracture. Orthopedics was consulted and she underwent left hip hemiarthroplasty on 11/28/16. As per clinical social worker, patient is non-ambulatory for a long time at baseline.   Assessment & Plan:   1. Left femoral neck fracture, status post hemiarthroplasty 11/28/16: Hip fracture sustained status post fall and possible UTI. Orthopedics was consulted and patient underwent surgery. Orthopedic follow-up appreciated. Weightbearing as tolerated. Continue prior aspirin + Plavix for postop DVT prophylaxis. Orthopedics has cleared for discharge and outpatient follow-up with them in 2 weeks. 2. Possible Escherichia coli UTI: No history available from patient. Urine microscopy suspicious for UTI. Urine culture only shows 10k colonies of Escherichia coli. Completed 3  days course of IV Rocephin . No further antibiotics at discharge. 3. Anemia: Expected postop acute blood loss anemia complicating chronic anemia. Hemoglobin dropped gradually from 10.9 preoperatively to postop 9.5 > 8.2 on 4/8, expected. Follow-up hemoglobin today is 8.7. Stable. Follow CBC in a couple of days at [facility].  4. Acute on Stage III chronic kidney disease: Creatinine worsened from 1.6-1.89.? Volume depletion versus transient ATN from hypotension yesterday. IV fluids and creatinine has improved to 1.32. 5. Essential hypertension/transient hypotension: SBP is in the 80s 8/7 morning.? Hypovolemia. Metoprolol was temporarily held. Hypotension resolved. Resume metoprolol at discharge. 6. CAD status post stent: No reported chest pain. EKG without acute changes. Home aspirin and Plavix held for surgery. Continue aspirin, Plavix and metoprolol. 7. Advanced dementia: Mental status at baseline per family. 8. Ataxia and falls: Secondary to advanced age, dementia and unsteady gait. PT evaluation >SNF. As per clinical social worker's follow-up with SNF, patient was nonambulatory for a long time prior to admission. 9. Hypothyroid: Continue Synthroid. 10. Hyperglycemia: Monitor CBGs >no concerns. DC CBGs.   Consultants:  Orthopedics   Procedures:  Left hip hemiarthroplasty 11/28/16 Foley catheter-discontinued 12/03/16 and voiding without difficulty.   Discharge Instructions  Discharge Instructions    Call MD for:  difficulty breathing, headache or visual disturbances    Complete by:  As directed    Call MD for:  extreme fatigue    Complete by:  As directed    Call MD for:  persistant dizziness or light-headedness    Complete by:  As directed    Call MD for:  persistant nausea and vomiting    Complete by:  As directed    Call MD for:  redness, tenderness, or signs of infection (pain, swelling, redness, odor or green/yellow discharge around incision site)    Complete by:  As directed    Call  MD for:  severe uncontrolled pain    Complete by:  As directed    Call MD for:  temperature >100.4    Complete by:  As directed    Diet - low sodium heart healthy    Complete by:  As directed    Increase activity slowly    Complete by:  As directed    Weight bearing as tolerated    Complete by:  As directed    Laterality:  bilateral       Medication List    STOP taking these medications   traMADol 50 MG tablet Commonly known as:  ULTRAM     TAKE these medications   acetaminophen 325 MG tablet Commonly known as:  TYLENOL Take 2 tablets (650 mg total) by mouth every 6 (six) hours as needed for mild pain, fever or headache (or Fever >/= 101).   aspirin 81 MG chewable tablet Chew 81 mg by mouth daily.   bisacodyl 10 MG suppository Commonly known as:  DULCOLAX Place 10 mg rectally as needed for moderate constipation.   clopidogrel 75 MG tablet Commonly known as:  PLAVIX Take 75 mg by mouth daily.   docusate sodium 100 MG capsule Commonly known as:  COLACE Take 1 capsule (100 mg total) by mouth 2 (two) times daily.   dorzolamidel-timolol 22.3-6.8 MG/ML Soln ophthalmic solution Commonly known as:  COSOPT Place 1 drop into both eyes every 12 (twelve) hours.   escitalopram 5 MG tablet Commonly known as:  LEXAPRO Take 5 mg by mouth daily.   HYDROcodone-acetaminophen 5-325 MG tablet Commonly known as:  NORCO/VICODIN Take 1 tablet by mouth every 6 (six) hours as needed for moderate pain.   latanoprost 0.005 % ophthalmic solution Commonly known as:  XALATAN Place 1 drop into both eyes at bedtime.   levothyroxine 50 MCG tablet Commonly known as:  SYNTHROID, LEVOTHROID Take 50 mcg by mouth daily before breakfast.   loperamide 2 MG tablet Commonly known as:  IMODIUM A-D Give 2 tablets by mouth (4 mg total) after first loose stool. May give 1 tablet by mouth (2 mg) after each loose stool not to exceed 8 mg by mouth in 24 hours.   magnesium hydroxide 400 MG/5ML  suspension Commonly known as:  MILK OF MAGNESIA Take 30 mLs by mouth daily as needed for mild constipation.   metoprolol tartrate 25 MG tablet Commonly known as:  LOPRESSOR Take 25 mg by mouth 2 (two) times daily.   multivitamin with minerals tablet Take 1 tablet by mouth daily.   omeprazole 40 MG capsule Commonly known as:  PRILOSEC Take 40 mg by mouth daily.   ondansetron 4 MG disintegrating tablet Commonly known as:  ZOFRAN ODT Take 1 tablet (4 mg total) by mouth every 8 (eight) hours as needed for nausea or vomiting.   RA SALINE ENEMA 19-7 GM/118ML Enem Place 1 each rectally as needed (for constipation).      Follow-up Information    Eldred Manges, MD Follow up in 2 week(s).   Specialty:  Orthopedic Surgery Contact information: [address] [place] [address] [phone]        Merrilee Seashore, MD. Schedule an appointment as soon as possible for a visit.   Specialty:  Internal Medicine Why:  Upon discharge from SNF. Contact information: [address] [place] [address] [phone]        M.D. at SNF. Schedule an appointment as soon as possible for a visit in 2 day(s).   Why:  To be seen with repeat labs (CBC & BMP).         Allergies  Allergen Reactions  . Codeine Nausea And Vomiting  . Morphine And Related Nausea And Vomiting  . Penicillins Hives      Procedures/Studies: Dg Chest 1 View  Result Date: 11/27/2016 CLINICAL DATA:  Hypoxia EXAM: CHEST 1 VIEW COMPARISON:  Diarrhea 16 2018 and August 24, 2015 FINDINGS: There is no edema or consolidation. There is apparent epicardial fat prominence along the lateral heart border on the left, unchanged. Heart is mildly prominent with pulmonary vascularity within normal limits, stable. There is aortic atherosclerosis. No adenopathy. No bone lesions. IMPRESSION: No edema or consolidation. Prominent epicardial fat on the left laterally. Stable cardiac silhouette. There is aortic  atherosclerosis. Electronically Signed   By: Bretta Bang III M.D.   On: 11/27/2016 11:56   Ct Head Wo Contrast  Result Date: 11/12/2016 CLINICAL DATA:  83 year old female with fall. EXAM: CT HEAD WITHOUT CONTRAST CT CERVICAL SPINE WITHOUT CONTRAST TECHNIQUE: Multidetector CT imaging of the head and cervical spine was performed following the standard protocol without intravenous contrast. Multiplanar CT image reconstructions of the cervical spine were also generated. COMPARISON:  None. FINDINGS: CT HEAD FINDINGS Brain: There is moderate age-related atrophy and chronic microvascular ischemic changes. There is dilatation of the ventricles out of proportion with the sulci which may represent central volume loss versus normal pressure hydrocephalus. Clinical correlation is recommended. There is no acute intracranial hemorrhage. No mass effect or midline shift noted. No extra-axial fluid collection. Vascular: No hyperdense vessel or unexpected calcification. Skull: Normal. Negative for fracture or focal lesion. Sinuses/Orbits: No acute finding. Other: Large left forehead hematoma. CT CERVICAL SPINE FINDINGS Alignment: No acute subluxation. Grade 1 C3-C4 and C5-C6 anterolisthesis. Skull base and vertebrae: No acute fracture. No primary bone lesion or focal pathologic process. Soft tissues and spinal canal: No prevertebral fluid or swelling. No visible canal hematoma. Disc levels: Degenerative changes. There is multilevel facet hypertrophy most prominent at C3-C4 on the left. Upper chest: Partially visualized atherosclerotic calcification of the aortic arch. No acute findings. Other: None IMPRESSION: 1. No acute intracranial hemorrhage. Moderate age-related atrophy and chronic microvascular ischemic changes. 2. No acute/traumatic cervical spine pathology. Chronic degenerative changes. Electronically Signed   By: Elgie Collard M.D.   On: 11/12/2016 05:18   Ct Cervical Spine Wo Contrast  Result Date:  11/12/2016 CLINICAL DATA:  83 year old female with fall. EXAM: CT HEAD WITHOUT CONTRAST CT CERVICAL SPINE WITHOUT CONTRAST TECHNIQUE: Multidetector CT imaging of the head and cervical spine was performed following the standard protocol without intravenous contrast. Multiplanar CT image reconstructions of the cervical spine were also generated. COMPARISON:  None. FINDINGS: CT HEAD FINDINGS Brain: There is moderate age-related atrophy and chronic microvascular ischemic changes. There is dilatation of the ventricles out of proportion with the sulci which may represent central volume loss versus normal pressure hydrocephalus. Clinical correlation is recommended. There is no acute intracranial hemorrhage. No mass effect or midline shift noted. No extra-axial fluid collection. Vascular: No hyperdense vessel or unexpected calcification. Skull: Normal. Negative for fracture or focal lesion. Sinuses/Orbits: No acute finding. Other: Large left forehead hematoma. CT CERVICAL SPINE FINDINGS Alignment: No acute subluxation. Grade 1 C3-C4 and C5-C6 anterolisthesis. Skull base and vertebrae: No acute fracture. No primary bone  lesion or focal pathologic process. Soft tissues and spinal canal: No prevertebral fluid or swelling. No visible canal hematoma. Disc levels: Degenerative changes. There is multilevel facet hypertrophy most prominent at C3-C4 on the left. Upper chest: Partially visualized atherosclerotic calcification of the aortic arch. No acute findings. Other: None IMPRESSION: 1. No acute intracranial hemorrhage. Moderate age-related atrophy and chronic microvascular ischemic changes. 2. No acute/traumatic cervical spine pathology. Chronic degenerative changes. Electronically Signed   By: Elgie Collard M.D.   On: 11/12/2016 05:18   Pelvis Portable  Result Date: 11/28/2016 CLINICAL DATA:  Status post left hip replacement today. The patient suffered an impacted subcapital fracture of the left femoral neck due to a fall  11/27/2016. EXAM: PORTABLE PELVIS 1-2 VIEWS COMPARISON:  CT left hip and plain films left hip 11/27/2016. FINDINGS: New bipolar left hip hemiarthroplasty is identified. The device is located. No fracture is identified. Surgical staples are noted. Old right hip replacement is noted. IMPRESSION: Status post left hip replacement today without evidence of complication. No acute abnormality. Electronically Signed   By: Drusilla Kanner M.D.   On: 11/28/2016 11:36   Ct Hip Left Wo Contrast  Result Date: 11/27/2016 CLINICAL DATA:  Left hip pain status post fall. EXAM: CT OF THE LEFT HIP WITHOUT CONTRAST TECHNIQUE: Multidetector CT imaging of the left hip was performed according to the standard protocol. Multiplanar CT image reconstructions were also generated. COMPARISON:  None. FINDINGS: Bones/Joint/Cartilage Generalized osteopenia. Impacted, nondisplaced subcapital left femoral neck fracture. No other fracture or dislocation. Normal alignment. No joint effusion. No lytic or sclerotic osseous lesion. Ligaments Ligaments are suboptimally evaluated by CT. Muscles and Tendons Generalized muscle atrophy. No intramuscular fluid collection or hematoma. Soft tissue No fluid collection or hematoma. No soft tissue mass. Peripheral vascular atherosclerotic disease. IMPRESSION: 1. Impacted, nondisplaced subcapital left femoral neck fracture. Electronically Signed   By: Elige Ko   On: 11/27/2016 14:05   Dg Hip Unilat With Pelvis 2-3 Views Left  Result Date: 11/27/2016 CLINICAL DATA:  Left hip pain after fall. EXAM: DG HIP (WITH OR WITHOUT PELVIS) 2-3V LEFT COMPARISON:  Radiographs of November 12, 2016. FINDINGS: Status post right hip arthroplasty. No definite dislocation is noted. Irregularity is seen involving the trochanteric region of the proximal left femur concerning for possible fracture. IMPRESSION: Irregularity seen involving the trochanteric region of proximal left femur concerning for possible fracture. CT or MRI  scan is recommended for further evaluation. Electronically Signed   By: Lupita Raider, M.D.   On: 11/27/2016 11:59   Dg Hips Bilat W Or Wo Pelvis 3-4 Views  Result Date: 11/12/2016 CLINICAL DATA:  83 year old female with fall. EXAM: DG HIP (WITH OR WITHOUT PELVIS) 3-4V BILAT COMPARISON:  None. FINDINGS: There is advanced osteopenia which limits evaluation for fracture. There is a right hip hemiarthroplasty which appears intact and in anatomic alignment. No evidence of hardware loosening. There is no acute fracture. No dislocation. There is degenerative changes of the lower lumbar spine. The soft tissues appear unremarkable. Vascular calcification noted. IMPRESSION: 1. No definite acute fracture or dislocation.  Advanced osteopenia. Electronically Signed   By: Elgie Collard M.D.   On: 11/12/2016 05:03      Subjective: Seen this morning. She was sitting up and trying to eat breakfast by herself. Oriented to self and partly to place "[facility]". Stated that she was okay and indicated that her left "rump hurt" when she moved. Denied any other complaints. No chest pain or dyspnea reported.  Discharge  Exam:  Vitals:   11/30/16 1659 11/30/16 2216 12/01/16 0520 12/01/16 1000  BP: (!) 122/47 (!) 120/50 (!) 143/52 (!) 133/46  Pulse: 74 79 81 74  Resp: 16 16 16 16   Temp: 97.5 F (36.4 C) 98.1 F (36.7 C) 98.6 F (37 C) 97.2 F (36.2 C)  TempSrc: Axillary Oral Oral Oral  SpO2: 100% 100% 100% 100%  Weight:  78 kg (171 lb 15.3 oz)    Height:        General exam: Pleasant elderly female, frail, sitting up in bed comfortably and eating breakfast. Respiratory system: Clear to auscultation. Respiratory effort normal. Mouth breathing. Cardiovascular system: S1 & S2 heard, RRR. No JVD, murmurs, rubs, gallops or clicks. No pedal edema. Gastrointestinal system: Abdomen is nondistended, soft and nontender. No organomegaly or masses felt. Normal bowel sounds heard. Central nervous system: Alert and  oriented only to self and partly to place. Follow simple instructions. No focal neurological deficits. Extremities: Spontaneously & symmetrically moves upper extremities. Left hip surgical site clean and dry. Left lower extremity in splint. Peripheral pulses symmetrically felt. Skin: No rashes, lesions or ulcers Psychiatry: Judgment impaired.    The results of significant diagnostics from this hospitalization (including imaging, microbiology, ancillary and laboratory) are listed below for reference.     Microbiology: Recent Results (from the past 240 hour(s))  Surgical pcr screen     Status: None   Collection Time: 11/27/16  9:57 PM  Result Value Ref Range Status   MRSA, PCR NEGATIVE NEGATIVE Final   Staphylococcus aureus NEGATIVE NEGATIVE Final    Comment:        The Xpert SA Assay (FDA approved for NASAL specimens in patients over 64 years of age), is one component of a comprehensive surveillance program.  Test performance has been validated by [facility] for patients greater than or equal to 19 year old. It is not intended to diagnose infection nor to guide or monitor treatment.   Culture, Urine     Status: Abnormal   Collection Time: 11/28/16  5:24 PM  Result Value Ref Range Status   Specimen Description URINE, CATHETERIZED  Final   Special Requests NONE  Final   Culture 10,000 COLONIES/mL ESCHERICHIA COLI (A)  Final   Report Status 11/30/2016 FINAL  Final   Organism ID, Bacteria ESCHERICHIA COLI (A)  Final      Susceptibility   Escherichia coli - MIC*    AMPICILLIN 4 SENSITIVE Sensitive     CEFAZOLIN <=4 SENSITIVE Sensitive     CEFTRIAXONE <=1 SENSITIVE Sensitive     CIPROFLOXACIN >=4 RESISTANT Resistant     GENTAMICIN <=1 SENSITIVE Sensitive     IMIPENEM <=0.25 SENSITIVE Sensitive     NITROFURANTOIN <=16 SENSITIVE Sensitive     TRIMETH/SULFA <=20 SENSITIVE Sensitive     AMPICILLIN/SULBACTAM 4 SENSITIVE Sensitive     PIP/TAZO <=4 SENSITIVE Sensitive      Extended ESBL NEGATIVE Sensitive     * 10,000 COLONIES/mL ESCHERICHIA COLI     Labs: CBC:  Recent Labs Lab 11/27/16 1123 11/28/16 0539 11/28/16 1251 11/29/16 0225 11/30/16 0354 12/01/16 0611  WBC 9.6 8.9 16.8* 10.0 9.9 9.1  NEUTROABS 6.2  --   --   --   --   --   HGB 11.1* 10.9* 10.9* 9.5* 8.2* 8.7*  HCT 35.0* 34.7* 34.9* 31.1* 26.2* 27.6*  MCV 97.0 98.9 98.6 97.8 97.0 97.5  PLT 220 240 255 264 226 268   Basic Metabolic Panel:  Recent Labs Lab  11/27/16 1200 11/28/16 0539 11/28/16 1251 11/29/16 0225 11/30/16 0354 12/01/16 0611  NA 135 138  --  136 133* 136  K 3.9 4.4  --  4.7 3.9 4.2  CL 101 98*  --  97* 100* 102  CO2 24 32  --  GLUCOSE 102* 95  --  214* 86 96  BUN 32* 27*  --  30* 38* 28*  CREATININE 1.66* 1.51* 1.48* 1.67* 1.89* 1.32*  CALCIUM 9.0 9.2  --  8.8* 8.4* 8.6*   Liver Function Tests:  Recent Labs Lab 11/27/16 1200  AST 22  ALT 14  ALKPHOS 78  BILITOT 0.3  PROT 6.7  ALBUMIN 2.8*   CBG:  Recent Labs Lab 11/30/16 1300 11/30/16 1703 11/30/16 2212 12/01/16 0752 12/01/16 1202  GLUCAP 93 142* 107* 98 104*   Urinalysis    Component Value Date/Time   COLORURINE YELLOW 11/27/2016 0619   APPEARANCEUR CLOUDY (A) 11/27/2016 0619   LABSPEC 1.018 11/27/2016 0619   PHURINE 5.0 11/27/2016 0619   GLUCOSEU NEGATIVE 11/27/2016 0619   HGBUR MODERATE (A) 11/27/2016 0619   BILIRUBINUR NEGATIVE 11/27/2016 0619   KETONESUR NEGATIVE 11/27/2016 0619   PROTEINUR NEGATIVE 11/27/2016 0619   UROBILINOGEN 0.2 04/11/2014 0111   NITRITE POSITIVE (A) 11/27/2016 0619   LEUKOCYTESUR LARGE (A) 11/27/2016 0619      Time coordinating discharge: Over 30 minutes  SIGNED:  Marcellus Scott, MD, FACP, FHM. [facility] Pager [phone]  If 7PM-7AM, please contact night-coverage [email or web address] Password [email or web address] 12/01/2016, 2:58 PM

## 2016-12-01 NOTE — Telephone Encounter (Signed)
I called nadia done.

## 2016-12-01 NOTE — Clinical Social Work Note (Signed)
Clinical Social Work Assessment  Patient Details  Name: [name] [name] MRN: [id number] Date of Birth: November 22, 1918  Date of referral:  12/01/16               Reason for consult:  Facility Placement                Permission sought to share information with:  Oceanographer granted to share information::  Yes, Verbal Permission Granted  Name::     Educational psychologist::  [facility]  Relationship::  Daughter  Contact Information:     Housing/Transportation Living arrangements for the past 2 months:  Skilled Building surveyor of Information:  Adult Children Patient Interpreter Needed:  None Criminal Activity/Legal Involvement Pertinent to Current Situation/Hospitalization:  No - Comment as needed Significant Relationships:  Adult Children Lives with:  Self, Facility Resident Do you feel safe going back to the place where you live?  Yes Need for family participation in patient care:  Yes (Comment)  Care giving concerns:  CSW received consult regarding discharge planning. Patient is disoriented. CSW spoke with patient's daughter, Aggie Cosier. Patient resides at [facility] long term care and will return at discharge. CSW to continue to follow and assist with discharge planning needs.   Social Worker assessment / plan:  CSW spoke with patient's daughter regarding return to SNF.  Employment status:  Retired Database administrator PT Recommendations:  Skilled Nursing Facility Information / Referral to community resources:  Skilled Nursing Facility  Patient/Family's Response to care:  Patient's daughter expresses agreement with plan to get patient back to [facility] by [employer].  Patient/Family's Understanding of and Emotional Response to Diagnosis, Current Treatment, and Prognosis:  Patient/family is realistic regarding therapy needs and expressed being hopeful that patient can do some therapy with her hip even though she is non-ambulatory at baseline.  Patient's daughter expressed understanding of CSW role and discharge process. No questions/concerns about plan or treatment.    Emotional Assessment Appearance:  Appears stated age Attitude/Demeanor/Rapport:  Unable to Assess Affect (typically observed):  Unable to Assess Orientation:   (Disoriented x4) Alcohol / Substance use:  Not Applicable Psych involvement (Current and /or in the community):  No (Comment)  Discharge Needs  Concerns to be addressed:  Care Coordination Readmission within the last 30 days:  No Current discharge risk:  None Barriers to Discharge:  No Barriers Identified   Mearl Latin, LCSWA 12/01/2016, 3:39 PM

## 2016-12-01 NOTE — Telephone Encounter (Signed)
fyi..please see messages below. Per Osborne Casco- per [employer], physical therapy is only approved to get the patient back to a previous state. She is long term in the facility at [facility] and has not been ambulatory. They will not approve for her.

## 2016-12-01 NOTE — Progress Notes (Signed)
[employer] unable to send pt to SNF under rehab. MD updated. Patient will return under Medicaid and can receive therapy under Medicare part B.  Osborne Casco Javaris Wigington LCSWA [phone]

## 2016-12-01 NOTE — Progress Notes (Signed)
Patient will DC to: [facility] Anticipated DC date: 12/01/16 Family notified: Daughter at bedside Transport by: Delon [place]   Per MD patient ready for DC to [facility]. RN, patient, patient's family, and facility notified of DC. Discharge Summary sent to facility. RN given number for report. DC packet on chart. Ambulance transport requested for patient.   CSW signing off.  Cristobal Goldmann, [place] Clinical Social Worker [phone]

## 2016-12-01 NOTE — Discharge Instructions (Signed)
Hip Fracture A hip fracture is a fracture of the upper part of your thigh bone (femur). What are the causes? A hip fracture is caused by a direct blow to the side of your hip. This is usually the result of a fall but can occur in other circumstances, such as an automobile accident. What increases the risk? There is an increased risk of hip fractures in people with:  An unsteady walking pattern (gait) and those with conditions that contribute to poor balance, such as Parkinson's disease or dementia.  Osteopenia and osteoporosis.  Cancer that spreads to the leg bones.  Certain metabolic diseases.  What are the signs or symptoms? Symptoms of hip fracture include:  Pain over the injured hip.  Inability to put weight on the leg in which the fracture occurred (although, some patients are able to walk after a hip fracture).  Toes and foot of the affected leg point outward when you lie down.  How is this diagnosed? A physical exam can determine if a hip fracture is likely to have occurred. X-ray exams are needed to confirm the fracture and to look for other injuries. The X-ray exam can help to determine the type of hip fracture. Rarely, the fracture is not visible on an X-ray image and a CT scan or MRI will have to be done. How is this treated? The treatment for a fracture is usually surgery. This means using a screw, nail, or rod to hold the bones in place. Follow these instructions at home: Take all medicines as directed by your health care provider. Contact a health care provider if: Pain continues, even after taking pain medicine. This information is not intended to replace advice given to you by your health care provider. Make sure you discuss any questions you have with your health care provider. Document Released: 08/11/2005 Document Revised: 01/17/2016 Document Reviewed: 03/23/2013 Elsevier Interactive Patient Education  2017 Elsevier Inc.  

## 2016-12-01 NOTE — Care Management Important Message (Signed)
Important Message  Patient Details  Name: [name] [name] MRN: [id number] Date of Birth: September 10, 1918   Medicare Important Message Given:  Yes    [name] Stefan Church 12/01/2016, 2:58 PM

## 2016-12-01 NOTE — Progress Notes (Signed)
qPhysical Therapy Treatment Patient Details Name: [name] [name] MRN: [id number] DOB: July 13, 1919 Today's Date: 12/01/2016    History of Present Illness 83 y.o female admitted 11/27/16 following fall at SNF with displaced Lt femoral neck fracture.  Now s/p L hemi arthoplasty. Pt is WBAT with posterior hip precautions.    PMH:  CAD, dementia, HTN, HLD, multiple falls, Rt hip fx.    PT Comments    Pt progressing slowly, fatigues easily and is sore biasing pt heavily to the right whether sitting or standing.  Worked on scooting to EOB, standing in midline in AD and transfer to the chair.   Follow Up Recommendations  SNF;Supervision/Assistance - 24 hour     Equipment Recommendations  None recommended by PT    Recommendations for Other Services       Precautions / Restrictions Precautions Precautions: Fall;Posterior Hip Precaution Comments: left Restrictions LLE Weight Bearing: Weight bearing as tolerated    Mobility  Bed Mobility Overal bed mobility: Needs Assistance Bed Mobility: Supine to Sit     Supine to sit: +2 for physical assistance;Max assist     General bed mobility comments: cue to bridge to EOB with boost assist.  Assist to come up and forward and assist with pad to scoot to EOB  Transfers Overall transfer level: Needs assistance   Transfers: Sit to/from Stand;Squat Pivot Transfers Sit to Stand: Max assist;+2 physical assistance   Squat pivot transfers: Max assist;+2 physical assistance     General transfer comment: standing--pt biased heavily R onto R LE in the RW and unable to get pt to midline or knees fully extended x 2 trials  Ambulation/Gait                 Stairs            Wheelchair Mobility    Modified Rankin (Stroke Patients Only)       Balance Overall balance assessment: Needs assistance;History of Falls Sitting-balance support: Single extremity supported;Feet unsupported Sitting balance-Leahy Scale: Poor Sitting balance -  Comments: Required UE support and external assist.  Was able to reach fair static sitting balance.  R lateral shift in sitting     Standing balance-Leahy Scale: Poor Standing balance comment: heavy lateral w/shift with flexed knees in RW                            Cognition Arousal/Alertness: Lethargic Behavior During Therapy: WFL for tasks assessed/performed Overall Cognitive Status: Within Functional Limits for tasks assessed                         Following Commands: Follows one step commands with increased time Safety/Judgement: Decreased awareness of deficits;Decreased awareness of safety   Problem Solving: Slow processing;Decreased initiation;Difficulty sequencing;Requires tactile cues;Requires verbal cues        Exercises      General Comments        Pertinent Vitals/Pain Pain Assessment: Faces Faces Pain Scale: Hurts little more Pain Location: l hip Pain Descriptors / Indicators: Grimacing;Guarding Pain Intervention(s): Monitored during session;Repositioned    Home Living                      Prior Function            PT Goals (current goals can now be found in the care plan section) Acute Rehab PT Goals Patient Stated Goal: Son: To return to SNF  PT Goal Formulation: With family Time For Goal Achievement: 12/13/16 Potential to Achieve Goals: Fair Progress towards PT goals: Progressing toward goals    Frequency    Min 3X/week      PT Plan Current plan remains appropriate    Co-evaluation             End of Session   Activity Tolerance: Patient limited by pain;Patient limited by fatigue Patient left: in chair;with call bell/phone within reach;with chair alarm set;with family/visitor present Nurse Communication: Mobility status PT Visit Diagnosis: Repeated falls (R29.6);Muscle weakness (generalized) (M62.81);Difficulty in walking, not elsewhere classified (R26.2);Pain Pain - Right/Left: Left Pain - part of  body: Leg     Time: 1459-1531 PT Time Calculation (min) (ACUTE ONLY): 32 min  Charges:  $Therapeutic Exercise: 8-22 mins $Therapeutic Activity: 8-22 mins                    G Codes:       12-08-2016  [place], PT [phone]  (pager)   Eliseo Gum Hajar Penninger 2016-12-08, 4:37 PM

## 2016-12-01 NOTE — Telephone Encounter (Signed)
[name] [name] called saying that the patient isn't going to be receiving Physical Therapy because of her insurance. CB# [phone]

## 2016-12-01 NOTE — Progress Notes (Signed)
   Subjective: 3 Days Post-Op Procedure(s) (LRB): ARTHROPLASTY BIPOLAR HIP (HEMIARTHROPLASTY) (Left) Patient reports pain as 0 on 0-10 scale.  sleeping  Objective: Vital signs in last 24 hours: Temp:  [97.2 F (36.2 C)-98.6 F (37 C)] 97.2 F (36.2 C) (04/09 1000) Pulse Rate:  [74-81] 74 (04/09 1000) Resp:  [16] 16 (04/09 1000) BP: (120-143)/(46-52) 133/46 (04/09 1000) SpO2:  [100 %] 100 % (04/09 1000) Weight:  [171 lb 15.3 oz (78 kg)] 171 lb 15.3 oz (78 kg) (04/08 2216)  Intake/Output from previous day: 04/08 0701 - 04/09 0700 In: 1715 [P.O.:300; I.V.:1365; IV Piggyback:50] Out: 2 [Urine:2] Intake/Output this shift: Total I/O In: 5 [P.O.:5] Out: -    Recent Labs  11/29/16 0225 11/30/16 0354 12/01/16 0611  HGB 9.5* 8.2* 8.7*    Recent Labs  11/30/16 0354 12/01/16 0611  WBC 9.9 9.1  RBC 2.70* 2.83*  HCT 26.2* 27.6*  PLT 226 268    Recent Labs  11/30/16 0354 12/01/16 0611  NA 133* 136  K 3.9 4.2  CL 100* 102  CO2 23 27  BUN 38* 28*  CREATININE 1.89* 1.32*  GLUCOSE 86 96  CALCIUM 8.4* 8.6*   No results for input(s): LABPT, INR in the last 72 hours.  Neurologically intact No results found.  Assessment/Plan: 3 Days Post-Op Procedure(s) (LRB): ARTHROPLASTY BIPOLAR HIP (HEMIARTHROPLASTY) (Left) Up with therapy  Back to SNF  [name] [name] 12/01/2016, 1:40 PM

## 2016-12-01 NOTE — Progress Notes (Signed)
Report given to Patent examiner at [employer].  Avelina Laine RN

## 2016-12-02 ENCOUNTER — Telehealth: Payer: Self-pay

## 2016-12-02 ENCOUNTER — Non-Acute Institutional Stay (SKILLED_NURSING_FACILITY): Payer: Medicare Other | Admitting: Internal Medicine

## 2016-12-02 DIAGNOSIS — S72002D Fracture of unspecified part of neck of left femur, subsequent encounter for closed fracture with routine healing: Secondary | ICD-10-CM | POA: Diagnosis not present

## 2016-12-02 DIAGNOSIS — N179 Acute kidney failure, unspecified: Secondary | ICD-10-CM

## 2016-12-02 DIAGNOSIS — N183 Chronic kidney disease, stage 3 (moderate): Secondary | ICD-10-CM

## 2016-12-02 DIAGNOSIS — I10 Essential (primary) hypertension: Secondary | ICD-10-CM | POA: Diagnosis not present

## 2016-12-02 DIAGNOSIS — K219 Gastro-esophageal reflux disease without esophagitis: Secondary | ICD-10-CM | POA: Diagnosis not present

## 2016-12-02 DIAGNOSIS — F0150 Vascular dementia without behavioral disturbance: Secondary | ICD-10-CM | POA: Diagnosis not present

## 2016-12-02 DIAGNOSIS — Z96649 Presence of unspecified artificial hip joint: Secondary | ICD-10-CM

## 2016-12-02 DIAGNOSIS — E039 Hypothyroidism, unspecified: Secondary | ICD-10-CM

## 2016-12-02 DIAGNOSIS — D62 Acute posthemorrhagic anemia: Secondary | ICD-10-CM | POA: Diagnosis not present

## 2016-12-02 DIAGNOSIS — I2510 Atherosclerotic heart disease of native coronary artery without angina pectoris: Secondary | ICD-10-CM | POA: Diagnosis not present

## 2016-12-02 DIAGNOSIS — N390 Urinary tract infection, site not specified: Secondary | ICD-10-CM

## 2016-12-02 DIAGNOSIS — I951 Orthostatic hypotension: Secondary | ICD-10-CM | POA: Diagnosis not present

## 2016-12-02 DIAGNOSIS — M84353D Stress fracture, unspecified femur, subsequent encounter for fracture with routine healing: Secondary | ICD-10-CM

## 2016-12-02 DIAGNOSIS — M84359D Stress fracture, hip, unspecified, subsequent encounter for fracture with routine healing: Secondary | ICD-10-CM

## 2016-12-02 DIAGNOSIS — B9620 Unspecified Escherichia coli [E. coli] as the cause of diseases classified elsewhere: Secondary | ICD-10-CM

## 2016-12-02 NOTE — Telephone Encounter (Signed)
This is a patient of [facility], who was admitted to [facility] after hospitalization. [facility] - Hospital F/U is needed. Hospital discharge from [facility] on 12/01/2016.

## 2016-12-06 ENCOUNTER — Encounter: Payer: Self-pay | Admitting: Internal Medicine

## 2016-12-06 NOTE — Progress Notes (Signed)
: Provider:   Location:  [facility]   Place of Service:  SNF (31)  PCP: Merrilee Seashore, MD Patient Care Team: Margit Hanks, MD as PCP - General (Internal Medicine)  Extended Emergency Contact Information Primary Emergency Contact: Hall Busing Address: [address]          [place], [place] [address] [place] Home Phone: [phone] Relation: Daughter Secondary Emergency Contact: Garner,Cathy Address: [phone]          [place] [address] [place] Home Phone: [phone] Work Phone: [phone] Relation: Daughter     Allergies: Codeine; Morphine and related; and Penicillins  Chief Complaint  Patient presents with  . New Admit To SNF    HPI: Patient is 83 y.o. female wIth CAD, advanced dementia, HTN, HLD, hypothyroid presented to ED with complaints of left hip pain after falls. CT confirmed left subcapital femur fracture seen on SNF Xray. Pt was admitted to [facility] from 4/5-9 where  Orthopedics was consulted and she underwent left hip hemiarthroplasty on 11/28/16. Hospital course was complicated by acute on CKD and an episode of hypotension and possible UTI. Pt is admitted to SNF for OT/PT and residential care. While at SNF pt will be followed for CAD, tx with metoprolol, ASA and plavix, hypothyroidism, tx with synthroid and GERD, tx with omeprazole.  Past Medical History:  Diagnosis Date  . AKI (acute kidney injury) (HCC) 07/2015  . Anemia   . Anginal pain (HCC)   . Anxiety   . CAD (coronary artery disease)    a. 02/2001 PTCA & BMS to the OM1;  b. 08/2001 CBA to ISR in OM1, CBA of mid LAD;  c. 02/2002 Failed PCI of mLAD occlusion, LCX nl, patent OM1 stent, RCA dom, 40p, PDA/PL nl, EF 50% mild antlat HK.  . Cataract    both eyes  . COPD (chronic obstructive pulmonary disease) (HCC)   . Dementia   . Depression   . Dyspnea   . GERD (gastroesophageal reflux disease)   . Glaucoma   . Hip fracture (HCC)   . Hypertension     . Hypertriglyceridemia   . Hypothyroidism   . Myocardial infarction (HCC)   . Osteoarthritis   . Poor circulation   . Thyroid disease    hypothyroidism, hypothyroidism  . UTI (urinary tract infection) 08/24/2015    Past Surgical History:  Procedure Laterality Date  . ABDOMINAL HYSTERECTOMY  1972  . CARDIAC SURGERY  2001   stent placement  . EYE SURGERY  2007/2008   cataracts  . GALLBLADDER SURGERY  2009  . HERNIA REPAIR  2009  . HIP ARTHROPLASTY  06/06/2012   Procedure: ARTHROPLASTY BIPOLAR HIP;  Surgeon: Eldred Manges, MD;  Location: [facility] ORS;  Service: Orthopedics;  Laterality: Right;  monopolar  . HIP ARTHROPLASTY Left 11/28/2016   Procedure: ARTHROPLASTY BIPOLAR HIP (HEMIARTHROPLASTY);  Surgeon: Eldred Manges, MD;  Location[facility] OR;  Service: Orthopedics;  Laterality: Left;    Allergies as of 12/02/2016      Reactions   Codeine Nausea And Vomiting   Morphine And Related Nausea And Vomiting   Penicillins Hives      Medication List       Accurate as of 12/02/16 11:59 PM. Always use your most recent med list.          acetaminophen 325 MG tablet Commonly known as:  TYLENOL Take 2 tablets (650 mg total) by mouth every 6 (six) hours as needed  for mild pain, fever or headache (or Fever >/= 101).   aspirin 81 MG chewable tablet Chew 81 mg by mouth daily.   bisacodyl 10 MG suppository Commonly known as:  DULCOLAX Place 10 mg rectally as needed for moderate constipation.   clopidogrel 75 MG tablet Commonly known as:  PLAVIX Take 75 mg by mouth daily.   docusate sodium 100 MG capsule Commonly known as:  COLACE Take 1 capsule (100 mg total) by mouth 2 (two) times daily.   dorzolamidel-timolol 22.3-6.8 MG/ML Soln ophthalmic solution Commonly known as:  COSOPT Place 1 drop into both eyes every 12 (twelve) hours.   escitalopram 5 MG tablet Commonly known as:  LEXAPRO Take 5 mg by mouth daily.   HYDROcodone-acetaminophen 5-325 MG tablet Commonly known as:   NORCO/VICODIN Take 1 tablet by mouth every 6 (six) hours as needed for moderate pain.   latanoprost 0.005 % ophthalmic solution Commonly known as:  XALATAN Place 1 drop into both eyes at bedtime.   levothyroxine 50 MCG tablet Commonly known as:  SYNTHROID, LEVOTHROID Take 50 mcg by mouth daily before breakfast.   loperamide 2 MG tablet Commonly known as:  IMODIUM A-D Give 2 tablets by mouth (4 mg total) after first loose stool. May give 1 tablet by mouth (2 mg) after each loose stool not to exceed 8 mg by mouth in 24 hours.   magnesium hydroxide 400 MG/5ML suspension Commonly known as:  MILK OF MAGNESIA Take 30 mLs by mouth daily as needed for mild constipation.   metoprolol tartrate 25 MG tablet Commonly known as:  LOPRESSOR Take 25 mg by mouth 2 (two) times daily.   multivitamin with minerals tablet Take 1 tablet by mouth daily.   omeprazole 40 MG capsule Commonly known as:  PRILOSEC Take 40 mg by mouth daily.   ondansetron 4 MG disintegrating tablet Commonly known as:  ZOFRAN ODT Take 1 tablet (4 mg total) by mouth every 8 (eight) hours as needed for nausea or vomiting.   RA SALINE ENEMA 19-7 GM/118ML Enem Place 1 each rectally as needed (for constipation).       No orders of the defined types were placed in this encounter.   Immunization History  Administered Date(s) Administered  . Influenza Whole 05/30/2013  . Influenza-Unspecified 06/23/2014, 05/21/2015, 05/24/2016  . PPD Test 10/26/2013  . Pneumococcal-Unspecified 08/23/2013  . Tdap 09/09/2016    Social History  Substance Use Topics  . Smoking status: Never Smoker  . Smokeless tobacco: Never Used  . Alcohol use No    Family history is   Family History  Problem Relation Age of Onset  . Arthritis Father   . Stroke Father   . Hypertension Father   . Heart disease Sister   . Heart disease Brother   . Hypertension Brother   . Stroke Brother       Review of Systems  DATA OBTAINED: from  patient, nurse GENERAL:  no fevers, fatigue, appetite changes SKIN: No itching, or rash EYES: No eye pain, redness, discharge EARS: No earache, tinnitus, change in hearing NOSE: No congestion, drainage or bleeding  MOUTH/THROAT: No mouth or tooth pain, No sore throat RESPIRATORY: No cough, wheezing, SOB CARDIAC: No chest pain, palpitations, lower extremity edema  GI: No abdominal pain, No N/V/D or constipation, No heartburn or reflux  GU: No dysuria, frequency or urgency, or incontinence  MUSCULOSKELETAL: No unrelieved bone/joint pain NEUROLOGIC: No headache, dizziness or focal weakness PSYCHIATRIC: No c/o anxiety or sadness   Vitals:  12/06/16 1916  BP: 112/62  Pulse: 66  Resp: 16  Temp: 97.7 F (36.5 C)    SpO2 Readings from Last 1 Encounters:  12/01/16 100%   There is no height or weight on file to calculate BMI.     Physical Exam  GENERAL APPEARANCE: Alert, mod conversant,  No acute distress.  SKIN: No diaphoresis rash HEAD: Normocephalic, atraumatic  EYES: Conjunctiva/lids clear. Pupils round, reactive. EOMs intact.  EARS: External exam WNL, canals clear. Hearing grossly normal.  NOSE: No deformity or discharge.  MOUTH/THROAT: Lips w/o lesions  RESPIRATORY: Breathing is even, unlabored. Lung sounds are clear   CARDIOVASCULAR: Heart RRR no murmurs, rubs or gallops. No peripheral edema.   GASTROINTESTINAL: Abdomen is soft, non-tender, not distended w/ normal bowel sounds. GENITOURINARY: Bladder non tender, not distended  MUSCULOSKELETAL: No abnormal joints or musculature NEUROLOGIC:  Cranial nerves 2-12 grossly intact. Moves all extremities  PSYCHIATRIC: Mood and affect with dementiao behavioral issues  Patient Active Problem List   Diagnosis Date Noted  . Pressure injury of skin 11/28/2016  . Hip fracture (HCC) 11/27/2016  . Dementia   . Hypothyroidism 05/04/2016  . Klebsiella pneumoniae sepsis (HCC) 12/01/2015  . Hx pulmonary embolism 12/01/2015  .  Palliative care encounter   . Pain in the chest   . Weakness   . Acute on chronic renal failure (HCC) 11/21/2015  . Acute respiratory failure with hypoxia (HCC) 11/21/2015  . CKD (chronic kidney disease) stage 3, GFR 30-59 ml/min 11/18/2015  . E. coli UTI (urinary tract infection) 08/28/2015  . GERD (gastroesophageal reflux disease) 08/28/2015  . Klebsiella cystitis 08/24/2015  . ARF (acute renal failure) (HCC) 08/24/2015  . Cough 09/24/2014  . Pain in joint, ankle and foot 04/15/2014  . Cellulitis 04/10/2014  . HLD (hyperlipidemia) 12/22/2013  . Renal insufficiency 11/06/2013  . Chest pain 10/26/2013  . Epigastric pain 10/26/2013  . OA (osteoarthritis) 10/03/2013  . Edema 03/03/2013  . Essential hypertension, benign 01/05/2013  . Anemia 01/05/2013  . Glaucoma 06/06/2012  . Closed left hip fracture (HCC) 06/05/2012  . CAD (coronary artery disease) 06/05/2012      Labs reviewed: Basic Metabolic Panel:    Component Value Date/Time   NA 136 12/01/2016 0611   NA 140 09/26/2016   K 4.2 12/01/2016 0611   CL 102 12/01/2016 0611   CO2 27 12/01/2016 0611   GLUCOSE 96 12/01/2016 0611   BUN 28 (H) 12/01/2016 0611   BUN 48 (A) 09/26/2016   CREATININE 1.32 (H) 12/01/2016 0611   CALCIUM 8.6 (L) 12/01/2016 0611   PROT 6.7 11/27/2016 1200   ALBUMIN 2.8 (L) 11/27/2016 1200   AST 22 11/27/2016 1200   ALT 14 11/27/2016 1200   ALKPHOS 78 11/27/2016 1200   BILITOT 0.3 11/27/2016 1200   GFRNONAA 33 (L) 12/01/2016 0611   GFRAA 38 (L) 12/01/2016 0611     Recent Labs  11/29/16 0225 11/30/16 0354 12/01/16 0611  NA 136 133* 136  K 4.7 3.9 4.2  CL 97* 100* 102  CO2 GLUCOSE 214* 86 96  BUN 30* 38* 28*  CREATININE 1.67* 1.89* 1.32*  CALCIUM 8.8* 8.4* 8.6*   Liver Function Tests:  Recent Labs  03/05/16 05/07/16 11/27/16 1200  AST ALT ALKPHOS 76 78 78  BILITOT  --   --  0.3  PROT  --   --  6.7  ALBUMIN  --   --  2.8*   No  results for  input(s): LIPASE, AMYLASE in the last 8760 hours. No results for input(s): AMMONIA in the last 8760 hours. CBC:  Recent Labs  11/27/16 1123  11/29/16 0225 11/30/16 0354 12/01/16 0611  WBC 9.6  < > 10.0 9.9 9.1  NEUTROABS 6.2  --   --   --   --   HGB 11.1*  < > 9.5* 8.2* 8.7*  HCT 35.0*  < > 31.1* 26.2* 27.6*  MCV 97.0  < > 97.8 97.0 97.5  PLT 220  < > 264 226 268  < > = values in this interval not displayed. Lipid  Recent Labs  05/07/16  CHOL 192  HDL 37  LDLCALC 78  TRIG 383*    Cardiac Enzymes: No results for input(s): CKTOTAL, CKMB, CKMBINDEX, TROPONINI in the last 8760 hours. BNP: No results for input(s): BNP in the last 8760 hours. No results found for: [facility] Lab Results  Component Value Date   HGBA1C 5.4 05/07/2016   Lab Results  Component Value Date   TSH 9.31 (A) 06/13/2016   No results found for: VITAMINB12 No results found for: FOLATE No results found for: IRON, TIBC, FERRITIN  Imaging and Procedures obtained prior to SNF admission: Dg Chest 1 View  Result Date: 11/27/2016 CLINICAL DATA:  Hypoxia EXAM: CHEST 1 VIEW COMPARISON:  Diarrhea 16 2018 and August 24, 2015 FINDINGS: There is no edema or consolidation. There is apparent epicardial fat prominence along the lateral heart border on the left, unchanged. Heart is mildly prominent with pulmonary vascularity within normal limits, stable. There is aortic atherosclerosis. No adenopathy. No bone lesions. IMPRESSION: No edema or consolidation. Prominent epicardial fat on the left laterally. Stable cardiac silhouette. There is aortic atherosclerosis. Electronically Signed   By: Bretta Bang III M.D.   On: 11/27/2016 11:56   Pelvis Portable  Result Date: 11/28/2016 CLINICAL DATA:  Status post left hip replacement today. The patient suffered an impacted subcapital fracture of the left femoral neck due to a fall 11/27/2016. EXAM: PORTABLE PELVIS 1-2 VIEWS COMPARISON:  CT left hip and plain films left hip  11/27/2016. FINDINGS: New bipolar left hip hemiarthroplasty is identified. The device is located. No fracture is identified. Surgical staples are noted. Old right hip replacement is noted. IMPRESSION: Status post left hip replacement today without evidence of complication. No acute abnormality. Electronically Signed   By: Drusilla Kanner M.D.   On: 11/28/2016 11:36   Ct Hip Left Wo Contrast  Result Date: 11/27/2016 CLINICAL DATA:  Left hip pain status post fall. EXAM: CT OF THE LEFT HIP WITHOUT CONTRAST TECHNIQUE: Multidetector CT imaging of the left hip was performed according to the standard protocol. Multiplanar CT image reconstructions were also generated. COMPARISON:  None. FINDINGS: Bones/Joint/Cartilage Generalized osteopenia. Impacted, nondisplaced subcapital left femoral neck fracture. No other fracture or dislocation. Normal alignment. No joint effusion. No lytic or sclerotic osseous lesion. Ligaments Ligaments are suboptimally evaluated by CT. Muscles and Tendons Generalized muscle atrophy. No intramuscular fluid collection or hematoma. Soft tissue No fluid collection or hematoma. No soft tissue mass. Peripheral vascular atherosclerotic disease. IMPRESSION: 1. Impacted, nondisplaced subcapital left femoral neck fracture. Electronically Signed   By: Elige Ko   On: 11/27/2016 14:05   Dg Hip Unilat With Pelvis 2-3 Views Left  Result Date: 11/27/2016 CLINICAL DATA:  Left hip pain after fall. EXAM: DG HIP (WITH OR WITHOUT PELVIS) 2-3V LEFT COMPARISON:  Radiographs of November 12, 2016. FINDINGS: Status post right hip arthroplasty. No definite dislocation is  noted. Irregularity is seen involving the trochanteric region of the proximal left femur concerning for possible fracture. IMPRESSION: Irregularity seen involving the trochanteric region of proximal left femur concerning for possible fracture. CT or MRI scan is recommended for further evaluation. Electronically Signed   By: Lupita Raider, M.D.    On: 11/27/2016 11:59     Not all labs, radiology exams or other studies done during hospitalization come through on my EPIC note; however they are reviewed by me.    Assessment and Plan  L FEMORAL NECK FRACTURE/ S/P HEMIARTHROPLASTY- on 11/28/16;WBAT;ASA and plavix for post-op prophylaxis SNF - admitted for OT/PT and residential care; cont ASA 81 mg daily and plavix 75 mg daily  POSSIBLE E COLI UTI- U/A suspicious for UTI, pt wastx with rocephin for 3 days; only 10,000 colonies, abx was stopped  POST-OP ANEMIA- hB 10.9 TO 9.5 TO 8.2 ON 4/8; D.C hB 8.7 SNF - will f/u CBC  ACUTE ON CKD3- Cr peaked 1.89 2/2 volume depletion vs ATN from transient hypotension; d/c Cr 1.32 SNF - will f/u BMP  HTN/TRANSIENT HYPOTENSION - resolved SNF - plan to cont metoprolol 25 mg BID  CAD/SP STENT SNF - cont ASA 81 mg daily, plavix 75 mg daily and metoprolol 25 mg BID; pt not on statin 2/2 age  DEMENTIA-at baseline per family SNF - pt is at baseline, I agree  HYPOTHYROIDISM SNF -  Controlled ; cont  Synthroid 50 mcg daily  GERD SNF - controlled;cont omeprazole 40 mg daily     Time spent > 45 min;> 50% of time with patient was spent reviewing records, labs, tests and studies, counseling and developing plan of care  Merrilee Seashore, MD

## 2016-12-07 DIAGNOSIS — M84353D Stress fracture, unspecified femur, subsequent encounter for fracture with routine healing: Secondary | ICD-10-CM | POA: Insufficient documentation

## 2016-12-07 DIAGNOSIS — I959 Hypotension, unspecified: Secondary | ICD-10-CM | POA: Insufficient documentation

## 2016-12-07 DIAGNOSIS — Z96649 Presence of unspecified artificial hip joint: Secondary | ICD-10-CM | POA: Insufficient documentation

## 2016-12-08 ENCOUNTER — Ambulatory Visit (INDEPENDENT_AMBULATORY_CARE_PROVIDER_SITE_OTHER): Payer: Medicare Other | Admitting: Orthopaedic Surgery

## 2016-12-08 ENCOUNTER — Ambulatory Visit (INDEPENDENT_AMBULATORY_CARE_PROVIDER_SITE_OTHER): Payer: Self-pay

## 2016-12-08 ENCOUNTER — Encounter (INDEPENDENT_AMBULATORY_CARE_PROVIDER_SITE_OTHER): Payer: Self-pay | Admitting: Orthopaedic Surgery

## 2016-12-08 DIAGNOSIS — S72002D Fracture of unspecified part of neck of left femur, subsequent encounter for closed fracture with routine healing: Secondary | ICD-10-CM

## 2016-12-08 NOTE — Progress Notes (Signed)
Patient is 2 weeks status post left hip is by Dr. Ophelia Charter. She is at a nursing home. The surgical incision has healed. The x-rays show stable hemiarthroplasty. Continue aspirin and Plavix that she starting on at baseline. Follow-up in 4 weeks with Dr. Ophelia Charter or Zonia Kief for recheck. Continue posterior hip precautions.

## 2016-12-08 NOTE — Progress Notes (Signed)
Opened in error; Disregard.

## 2016-12-16 ENCOUNTER — Non-Acute Institutional Stay (SKILLED_NURSING_FACILITY): Payer: Medicare Other | Admitting: Internal Medicine

## 2016-12-16 ENCOUNTER — Encounter: Payer: Self-pay | Admitting: Internal Medicine

## 2016-12-16 DIAGNOSIS — F29 Unspecified psychosis not due to a substance or known physiological condition: Secondary | ICD-10-CM

## 2016-12-16 DIAGNOSIS — R296 Repeated falls: Secondary | ICD-10-CM | POA: Diagnosis not present

## 2016-12-16 DIAGNOSIS — R627 Adult failure to thrive: Secondary | ICD-10-CM | POA: Diagnosis not present

## 2016-12-16 NOTE — Progress Notes (Signed)
Location:  [facility] Room Number: [phone] Place of Service:  SNF [phone])  Merrilee Seashore, MD  Patient Care Team: Margit Hanks, MD as PCP - General (Internal Medicine)  Extended Emergency Contact Information Primary Emergency Contact: Hall Busing Address: [address]          [place], [place] [address] [place] Home Phone: [phone] Relation: Daughter Secondary Emergency Contact: Garner,Cathy Address: [phone]          [place] [address] [place] Home Phone: [phone] Work Phone: [phone] Relation: Daughter    Allergies: Codeine; Morphine and related; and Penicillins  Chief Complaint  Patient presents with  . Acute Visit    Acute    HPI: Patient is 83 y.o. female who nursing has asked me to see for several issues. They say she has started to wheeze today. No could cough or fever. Also she has been falling more in the past 2 weeks. In addition pt has been seeing people at the window at night that are trying to get in.. Pt has been FTT.  Past Medical History:  Diagnosis Date  . AKI (acute kidney injury) (HCC) 07/2015  . Anemia   . Anginal pain (HCC)   . Anxiety   . CAD (coronary artery disease)    a. 02/2001 PTCA & BMS to the OM1;  b. 08/2001 CBA to ISR in OM1, CBA of mid LAD;  c. 02/2002 Failed PCI of mLAD occlusion, LCX nl, patent OM1 stent, RCA dom, 40p, PDA/PL nl, EF 50% mild antlat HK.  . Cataract    both eyes  . COPD (chronic obstructive pulmonary disease) (HCC)   . Dementia   . Depression   . Dyspnea   . GERD (gastroesophageal reflux disease)   . Glaucoma   . Hip fracture (HCC)   . Hypertension   . Hypertriglyceridemia   . Hypothyroidism   . Myocardial infarction (HCC)   . Osteoarthritis   . Poor circulation   . Thyroid disease    hypothyroidism, hypothyroidism  . UTI (urinary tract infection) 08/24/2015    Past Surgical History:  Procedure Laterality Date  . ABDOMINAL  HYSTERECTOMY  1972  . CARDIAC SURGERY  2001   stent placement  . EYE SURGERY  2007/2008   cataracts  . GALLBLADDER SURGERY  2009  . HERNIA REPAIR  2009  . HIP ARTHROPLASTY  06/06/2012   Procedure: ARTHROPLASTY BIPOLAR HIP;  Surgeon: Eldred Manges, MD;  Location: [facility];  Service: Orthopedics;  Laterality: Right;  monopolar  . HIP ARTHROPLASTY Left 11/28/2016   Procedure: ARTHROPLASTY BIPOLAR HIP (HEMIARTHROPLASTY);  Surgeon: Eldred Manges, MD;  Location: [facility] OR;  Service: Orthopedics;  Laterality: Left;    Allergies as of 12/16/2016      Reactions   Codeine Nausea And Vomiting   Morphine And Related Nausea And Vomiting   Penicillins Hives      Medication List       Accurate as of 12/16/16  1:58 PM. Always use your most recent med list.          acetaminophen 325 MG tablet Commonly known as:  TYLENOL Take 2 tablets (650 mg total) by mouth every 6 (six) hours as needed for mild pain, fever or headache (or Fever >/= 101).   aspirin 81 MG chewable tablet Chew 81 mg by mouth daily.   bisacodyl 10 MG suppository Commonly known as:  DULCOLAX Place 10 mg rectally as needed for moderate  constipation.   clopidogrel 75 MG tablet Commonly known as:  PLAVIX Take 75 mg by mouth daily.   docusate sodium 100 MG capsule Commonly known as:  COLACE Take 1 capsule (100 mg total) by mouth 2 (two) times daily.   dorzolamidel-timolol 22.3-6.8 MG/ML Soln ophthalmic solution Commonly known as:  COSOPT Place 1 drop into both eyes every 12 (twelve) hours.   escitalopram 5 MG tablet Commonly known as:  LEXAPRO Take 5 mg by mouth daily.   HYDROcodone-acetaminophen 5-325 MG tablet Commonly known as:  NORCO/VICODIN Take 1 tablet by mouth every 6 (six) hours as needed for moderate pain.   latanoprost 0.005 % ophthalmic solution Commonly known as:  XALATAN Place 1 drop into both eyes at bedtime.   levothyroxine 50 MCG tablet Commonly known as:  SYNTHROID, LEVOTHROID Take 50 mcg by mouth  daily before breakfast.   loperamide 2 MG tablet Commonly known as:  IMODIUM A-D Give 2 tablets by mouth (4 mg total) after first loose stool. May give 1 tablet by mouth (2 mg) after each loose stool not to exceed 8 mg by mouth in 24 hours.   magnesium hydroxide 400 MG/5ML suspension Commonly known as:  MILK OF MAGNESIA Take 30 mLs by mouth daily as needed for mild constipation.   metoprolol tartrate 25 MG tablet Commonly known as:  LOPRESSOR Take 25 mg by mouth 2 (two) times daily.   multivitamin with minerals tablet Take 1 tablet by mouth daily.   omeprazole 40 MG capsule Commonly known as:  PRILOSEC Take 40 mg by mouth daily.   ondansetron 4 MG disintegrating tablet Commonly known as:  ZOFRAN ODT Take 1 tablet (4 mg total) by mouth every 8 (eight) hours as needed for nausea or vomiting.   RA SALINE ENEMA 19-7 GM/118ML Enem Place 1 each rectally as needed (for constipation).       No orders of the defined types were placed in this encounter.   Immunization History  Administered Date(s) Administered  . Influenza Whole 05/30/2013  . Influenza-Unspecified 06/23/2014, 05/21/2015, 05/24/2016  . PPD Test 10/26/2013  . Pneumococcal-Unspecified 08/23/2013  . Tdap 09/09/2016    Social History  Substance Use Topics  . Smoking status: Never Smoker  . Smokeless tobacco: Never Used  . Alcohol use No    Review of Systems  UTO 2/2 dementia; per nursing as per HPI     Vitals:   12/16/16 1355  BP: 99/66  Pulse: 66  Resp: 18  Temp: 97.6 F (36.4 C)   Body mass index is 30.36 kg/m. Physical Exam  GENERAL APPEARANCE: eyes closed, opens to voice SKIN: No diaphoresis rash HEENT: Unremarkable RESPIRATORY: Breathing is even, unlabored. Lung sounds are no wheezing but pt is moaning with every breatrh  CARDIOVASCULAR: Heart RRR no murmurs, rubs or gallops. No peripheral edema  GASTROINTESTINAL: Abdomen is soft, non-tender, not distended w/ normal bowel sounds.    GENITOURINARY: Bladder non tender, not distended  MUSCULOSKELETAL: No abnormal joints or musculature NEUROLOGIC: Cranial nerves 2-12 grossly intact. Moves all extremities PSYCHIATRIC: depressed affect, no behavioral issues  Patient Active Problem List   Diagnosis Date Noted  . Femoral neck stress fracture, with routine healing, subsequent encounter 12/07/2016  . S/P hip hemiarthroplasty 12/07/2016  . Hypotension 12/07/2016  . Pressure injury of skin 11/28/2016  . Hip fracture (HCC) 11/27/2016  . Dementia   . Hypothyroidism 05/04/2016  . Klebsiella pneumoniae sepsis (HCC) 12/01/2015  . Hx pulmonary embolism 12/01/2015  . Palliative care encounter   .  Pain in the chest   . Weakness   . Acute on chronic renal failure (HCC) 11/21/2015  . Acute respiratory failure with hypoxia (HCC) 11/21/2015  . CKD (chronic kidney disease) stage 3, GFR 30-59 ml/min 11/18/2015  . E. coli UTI (urinary tract infection) 08/28/2015  . GERD (gastroesophageal reflux disease) 08/28/2015  . Klebsiella cystitis 08/24/2015  . ARF (acute renal failure) (HCC) 08/24/2015  . Cough 09/24/2014  . Pain in joint, ankle and foot 04/15/2014  . Cellulitis 04/10/2014  . HLD (hyperlipidemia) 12/22/2013  . Renal insufficiency 11/06/2013  . Chest pain 10/26/2013  . Epigastric pain 10/26/2013  . OA (osteoarthritis) 10/03/2013  . Edema 03/03/2013  . Essential hypertension, benign 01/05/2013  . Acute blood loss as cause of postoperative anemia 01/05/2013  . Glaucoma 06/06/2012  . Closed left hip fracture (HCC) 06/05/2012  . CAD (coronary artery disease) 06/05/2012  . Hypertension     CMP     Component Value Date/Time   NA 136 12/01/2016 0611   NA 136 (A) 12/01/2016   K 4.2 12/01/2016 0611   CL 102 12/01/2016 0611   CO2 27 12/01/2016 0611   GLUCOSE 96 12/01/2016 0611   BUN 28 (H) 12/01/2016 0611   BUN 28 (A) 12/01/2016   CREATININE 1.32 (H) 12/01/2016 0611   CALCIUM 8.6 (L) 12/01/2016 0611   PROT 6.7  11/27/2016 1200   ALBUMIN 2.8 (L) 11/27/2016 1200   AST 22 11/27/2016 1200   ALT 14 11/27/2016 1200   ALKPHOS 78 11/27/2016 1200   BILITOT 0.3 11/27/2016 1200   GFRNONAA 33 (L) 12/01/2016 0611   GFRAA 38 (L) 12/01/2016 0611    Recent Labs  11/29/16 0225 11/30/16 11/30/16 0354 12/01/16 12/01/16 0611  NA 136 133* 133* 136* 136  K 4.7 3.9 3.9  --  4.2  CL 97*  --  100*  --  102  CO2 26  --  23  --  27  GLUCOSE 214*  --  86  --  96  BUN 30* 38* 38* 28* 28*  CREATININE 1.67* 1.9* 1.89* 1.3* 1.32*  CALCIUM 8.8*  --  8.4*  --  8.6*    Recent Labs  03/05/16 05/07/16 11/27/16 1200  AST ALT ALKPHOS 76 78 78  BILITOT  --   --  0.3  PROT  --   --  6.7  ALBUMIN  --   --  2.8*    Recent Labs  11/27/16 1123  11/29/16 0225 11/30/16 11/30/16 0354 12/01/16 12/01/16 0611  WBC 9.6  < > 10.0 9.9 9.9 9.1 9.1  NEUTROABS 6.2  --   --   --   --   --   --   HGB 11.1*  < > 9.5* 8.2* 8.2*  --  8.7*  HCT 35.0*  < > 31.1* 26* 26.2*  --  27.6*  MCV 97.0  < > 97.8  --  97.0  --  97.5  PLT 220  < > 264 226 226  --  268  < > = values in this interval not displayed.  Recent Labs  05/07/16  CHOL 192  LDLCALC 78  TRIG 383*   No results found for: [facility] Lab Results  Component Value Date   TSH 9.31 (A) 06/13/2016   Lab Results  Component Value Date   HGBA1C 5.4 05/07/2016   Lab Results  Component Value Date   CHOL 192 05/07/2016   HDL 37 05/07/2016   LDLCALC  78 05/07/2016   TRIG 383 (A) 05/07/2016   CHOLHDL 5.2 10/27/2013    Significant Diagnostic Results in last 30 days:  Dg Chest 1 View  Result Date: 11/27/2016 CLINICAL DATA:  Hypoxia EXAM: CHEST 1 VIEW COMPARISON:  Diarrhea 16 2018 and August 24, 2015 FINDINGS: There is no edema or consolidation. There is apparent epicardial fat prominence along the lateral heart border on the left, unchanged. Heart is mildly prominent with pulmonary vascularity within normal limits, stable. There is aortic  atherosclerosis. No adenopathy. No bone lesions. IMPRESSION: No edema or consolidation. Prominent epicardial fat on the left laterally. Stable cardiac silhouette. There is aortic atherosclerosis. Electronically Signed   By: Bretta Bang III M.D.   On: 11/27/2016 11:56   Pelvis Portable  Result Date: 11/28/2016 CLINICAL DATA:  Status post left hip replacement today. The patient suffered an impacted subcapital fracture of the left femoral neck due to a fall 11/27/2016. EXAM: PORTABLE PELVIS 1-2 VIEWS COMPARISON:  CT left hip and plain films left hip 11/27/2016. FINDINGS: New bipolar left hip hemiarthroplasty is identified. The device is located. No fracture is identified. Surgical staples are noted. Old right hip replacement is noted. IMPRESSION: Status post left hip replacement today without evidence of complication. No acute abnormality. Electronically Signed   By: Drusilla Kanner M.D.   On: 11/28/2016 11:36   Ct Hip Left Wo Contrast  Result Date: 11/27/2016 CLINICAL DATA:  Left hip pain status post fall. EXAM: CT OF THE LEFT HIP WITHOUT CONTRAST TECHNIQUE: Multidetector CT imaging of the left hip was performed according to the standard protocol. Multiplanar CT image reconstructions were also generated. COMPARISON:  None. FINDINGS: Bones/Joint/Cartilage Generalized osteopenia. Impacted, nondisplaced subcapital left femoral neck fracture. No other fracture or dislocation. Normal alignment. No joint effusion. No lytic or sclerotic osseous lesion. Ligaments Ligaments are suboptimally evaluated by CT. Muscles and Tendons Generalized muscle atrophy. No intramuscular fluid collection or hematoma. Soft tissue No fluid collection or hematoma. No soft tissue mass. Peripheral vascular atherosclerotic disease. IMPRESSION: 1. Impacted, nondisplaced subcapital left femoral neck fracture. Electronically Signed   By: Elige Ko   On: 11/27/2016 14:05   Dg Hip Unilat With Pelvis 2-3 Views Left  Result Date:  11/27/2016 CLINICAL DATA:  Left hip pain after fall. EXAM: DG HIP (WITH OR WITHOUT PELVIS) 2-3V LEFT COMPARISON:  Radiographs of November 12, 2016. FINDINGS: Status post right hip arthroplasty. No definite dislocation is noted. Irregularity is seen involving the trochanteric region of the proximal left femur concerning for possible fracture. IMPRESSION: Irregularity seen involving the trochanteric region of proximal left femur concerning for possible fracture. CT or MRI scan is recommended for further evaluation. Electronically Signed   By: Lupita Raider, M.D.   On: 11/27/2016 11:59   Xr Pelvis 1-2 Views  Result Date: 12/08/2016 Stable left partial hip replacement   Assessment and Plan  FALLS/FTT - pt does not appear in pain, and has no reason to be in pain; I think she is moaning as a comfort; she is definitely not wheezing; pt started lexapro 2 weeks ago when inc falls began; will d/c lexapro  PSYCHOSIS -with visual hallucinations and fear; have started seroquel 25 mg qHS; will have psych see pt     Randon Goldsmith. [name], MD

## 2016-12-17 ENCOUNTER — Encounter: Payer: Self-pay | Admitting: Internal Medicine

## 2016-12-17 NOTE — Assessment & Plan Note (Signed)
No c/o CP or equivalent; plan to cont ASA 81 mg daily and metoprolol 25 mg BID

## 2016-12-17 NOTE — Assessment & Plan Note (Signed)
On no specific meds; cont supportive care

## 2016-12-17 NOTE — Assessment & Plan Note (Signed)
Stable and chronic; cont xalatin and cosopt drops

## 2016-12-26 ENCOUNTER — Encounter: Payer: Self-pay | Admitting: Internal Medicine

## 2016-12-26 ENCOUNTER — Non-Acute Institutional Stay (SKILLED_NURSING_FACILITY): Payer: Medicare Other | Admitting: Internal Medicine

## 2016-12-26 DIAGNOSIS — K219 Gastro-esophageal reflux disease without esophagitis: Secondary | ICD-10-CM | POA: Diagnosis not present

## 2016-12-26 DIAGNOSIS — M150 Primary generalized (osteo)arthritis: Secondary | ICD-10-CM | POA: Diagnosis not present

## 2016-12-26 DIAGNOSIS — N184 Chronic kidney disease, stage 4 (severe): Secondary | ICD-10-CM | POA: Diagnosis not present

## 2016-12-26 DIAGNOSIS — M159 Polyosteoarthritis, unspecified: Secondary | ICD-10-CM

## 2016-12-26 NOTE — Progress Notes (Signed)
Location:  [facility] Room Number: [phone] Place of Service:  SNF [phone])  Margit Hanks, MD  Patient Care Team: Margit Hanks, MD as PCP - General (Internal Medicine)  Extended Emergency Contact Information Primary Emergency Contact: Hall Busing Address: [address]          [place], [place] [address] [place] Home Phone: [phone] Mobile Phone: [phone] Relation: Daughter Secondary Emergency Contact: Marlowe Aschoff Address: [phone] [address]          [place] [address] [place] Home Phone: [phone] Work Phone: [phone] Relation: Daughter    Allergies: Codeine; Morphine and related; and Penicillins  Chief Complaint  Patient presents with  . Medical Management of Chronic Issues    Routine Visit    HPI: Patient is 83 y.o. female who is being seen for routine isuues of GERD, OA and CKD4.  Past Medical History:  Diagnosis Date  . AKI (acute kidney injury) (HCC) 07/2015  . Anemia   . Anginal pain (HCC)   . Anxiety   . CAD (coronary artery disease)    a. 02/2001 PTCA & BMS to the OM1;  b. 08/2001 CBA to ISR in OM1, CBA of mid LAD;  c. 02/2002 Failed PCI of mLAD occlusion, LCX nl, patent OM1 stent, RCA dom, 40p, PDA/PL nl, EF 50% mild antlat HK.  . Cataract    both eyes  . COPD (chronic obstructive pulmonary disease) (HCC)   . Dementia   . Depression   . Dyspnea   . GERD (gastroesophageal reflux disease)   . Glaucoma   . Hip fracture (HCC)   . Hypertension   . Hypertriglyceridemia   . Hypothyroidism   . Myocardial infarction (HCC)   . Osteoarthritis   . Poor circulation   . Thyroid disease    hypothyroidism, hypothyroidism  . UTI (urinary tract infection) 08/24/2015    Past Surgical History:  Procedure Laterality Date  . ABDOMINAL HYSTERECTOMY  1972  . CARDIAC SURGERY  2001   stent placement  . EYE SURGERY  2007/2008   cataracts  . GALLBLADDER SURGERY  2009  . HERNIA REPAIR  2009   . HIP ARTHROPLASTY  06/06/2012   Procedure: ARTHROPLASTY BIPOLAR HIP;  Surgeon: Eldred Manges, MD;  Location: [facility];  Service: Orthopedics;  Laterality: Right;  monopolar  . HIP ARTHROPLASTY Left 11/28/2016   Procedure: ARTHROPLASTY BIPOLAR HIP (HEMIARTHROPLASTY);  Surgeon: Eldred Manges, MD;  Location: [facility] OR;  Service: Orthopedics;  Laterality: Left;    Allergies as of 12/26/2016      Reactions   Codeine Nausea And Vomiting   Morphine And Related Nausea And Vomiting   Penicillins Hives      Medication List       Accurate as of 12/26/16 11:59 PM. Always use your most recent med list.          acetaminophen 325 MG tablet Commonly known as:  TYLENOL Take 2 tablets (650 mg total) by mouth every 6 (six) hours as needed for mild pain, fever or headache (or Fever >/= 101).   aspirin 81 MG chewable tablet Chew 81 mg by mouth daily.   bisacodyl 10 MG suppository Commonly known as:  DULCOLAX Place 10 mg rectally as needed for moderate constipation.   clopidogrel 75 MG tablet Commonly known as:  PLAVIX Take 75 mg by mouth daily.   docusate sodium 100 MG capsule Commonly known as:  COLACE Take 1 capsule (100 mg total) by  mouth 2 (two) times daily.   dorzolamidel-timolol 22.3-6.8 MG/ML Soln ophthalmic solution Commonly known as:  COSOPT Place 1 drop into both eyes every 12 (twelve) hours.   escitalopram 5 MG tablet Commonly known as:  LEXAPRO Take 5 mg by mouth daily.   HYDROcodone-acetaminophen 5-325 MG tablet Commonly known as:  NORCO/VICODIN Take 1 tablet by mouth every 6 (six) hours as needed for moderate pain.   latanoprost 0.005 % ophthalmic solution Commonly known as:  XALATAN Place 1 drop into both eyes at bedtime.   levothyroxine 50 MCG tablet Commonly known as:  SYNTHROID, LEVOTHROID Take 50 mcg by mouth daily before breakfast.   loperamide 2 MG tablet Commonly known as:  IMODIUM A-D Give 2 tablets by mouth (4 mg total) after first loose stool. May give 1 tablet  by mouth (2 mg) after each loose stool not to exceed 8 mg by mouth in 24 hours.   magnesium hydroxide 400 MG/5ML suspension Commonly known as:  MILK OF MAGNESIA Take 30 mLs by mouth daily as needed for mild constipation.   metoprolol tartrate 25 MG tablet Commonly known as:  LOPRESSOR Take 25 mg by mouth 2 (two) times daily.   multivitamin with minerals tablet Take 1 tablet by mouth daily.   omeprazole 40 MG capsule Commonly known as:  PRILOSEC Take 40 mg by mouth daily.   ondansetron 4 MG disintegrating tablet Commonly known as:  ZOFRAN ODT Take 1 tablet (4 mg total) by mouth every 8 (eight) hours as needed for nausea or vomiting.   RA SALINE ENEMA 19-7 GM/118ML Enem Place 1 each rectally as needed (for constipation).       No orders of the defined types were placed in this encounter.   Immunization History  Administered Date(s) Administered  . Influenza Whole 05/30/2013  . Influenza-Unspecified 06/23/2014, 05/21/2015, 05/24/2016  . PPD Test 10/26/2013  . Pneumococcal-Unspecified 08/23/2013  . Tdap 09/09/2016    Social History  Substance Use Topics  . Smoking status: Never Smoker  . Smokeless tobacco: Never Used  . Alcohol use No    Review of Systems  DATA OBTAINED: from patient, nurse GENERAL:  no fevers, fatigue, appetite changes SKIN: No itching, rash HEENT: No complaint RESPIRATORY: No cough, wheezing, SOB CARDIAC: No chest pain, palpitations, lower extremity edema  GI: No abdominal pain, No N/V/D or constipation, No heartburn or reflux  GU: No dysuria, frequency or urgency, or incontinence  MUSCULOSKELETAL: No unrelieved bone/joint pain NEUROLOGIC: No headache, dizziness  PSYCHIATRIC: No overt anxiety or sadness  Vitals:   12/26/16 1020  BP: 99/66  Pulse: 66  Resp: 18  Temp: 97.2 F (36.2 C)   Body mass index is 30.36 kg/m. Physical Exam  GENERAL APPEARANCE: Alert, No acute distress  SKIN: No diaphoresis rash HEENT:  Unremarkable RESPIRATORY: Breathing is even, unlabored. Lung sounds are clear   CARDIOVASCULAR: Heart RRR no murmurs, rubs or gallops. No peripheral edema  GASTROINTESTINAL: Abdomen is soft, non-tender, not distended w/ normal bowel sounds.  GENITOURINARY: Bladder non tender, not distended  MUSCULOSKELETAL: No abnormal joints or musculature NEUROLOGIC: Cranial nerves 2-12 grossly intact. Moves all extremities PSYCHIATRIC: Mood and affect with dementia, no behavioral issues  Patient Active Problem List   Diagnosis Date Noted  . Recurrent falls 01/18/2017  . FTT (failure to thrive) in adult 01/18/2017  . Psychosis 01/18/2017  . Femoral neck stress fracture, with routine healing, subsequent encounter 12/07/2016  . S/P hip hemiarthroplasty 12/07/2016  . Hypotension 12/07/2016  . Pressure injury of skin  11/28/2016  . Hip fracture (HCC) 11/27/2016  . Dementia   . Hypothyroidism 05/04/2016  . Klebsiella pneumoniae sepsis (HCC) 12/01/2015  . Hx pulmonary embolism 12/01/2015  . Palliative care encounter   . Pain in the chest   . Weakness   . Acute on chronic renal failure (HCC) 11/21/2015  . Acute respiratory failure with hypoxia (HCC) 11/21/2015  . CKD (chronic kidney disease) stage 4, GFR 15-29 ml/min (HCC) 11/18/2015  . E. coli UTI (urinary tract infection) 08/28/2015  . GERD (gastroesophageal reflux disease) 08/28/2015  . Klebsiella cystitis 08/24/2015  . ARF (acute renal failure) (HCC) 08/24/2015  . Cough 09/24/2014  . Pain in joint, ankle and foot 04/15/2014  . Cellulitis 04/10/2014  . HLD (hyperlipidemia) 12/22/2013  . Renal insufficiency 11/06/2013  . Chest pain 10/26/2013  . Epigastric pain 10/26/2013  . OA (osteoarthritis) 10/03/2013  . Edema 03/03/2013  . Essential hypertension, benign 01/05/2013  . Acute blood loss as cause of postoperative anemia 01/05/2013  . Glaucoma 06/06/2012  . Closed left hip fracture (HCC) 06/05/2012  . CAD (coronary artery disease) 06/05/2012   . Hypertension     CMP     Component Value Date/Time   NA 136 12/01/2016 0611   NA 136 (A) 12/01/2016   K 4.2 12/01/2016 0611   CL 102 12/01/2016 0611   CO2 27 12/01/2016 0611   GLUCOSE 96 12/01/2016 0611   BUN 28 (H) 12/01/2016 0611   BUN 28 (A) 12/01/2016   CREATININE 1.32 (H) 12/01/2016 0611   CALCIUM 8.6 (L) 12/01/2016 0611   PROT 6.7 11/27/2016 1200   ALBUMIN 2.8 (L) 11/27/2016 1200   AST 22 11/27/2016 1200   ALT 14 11/27/2016 1200   ALKPHOS 78 11/27/2016 1200   BILITOT 0.3 11/27/2016 1200   GFRNONAA 33 (L) 12/01/2016 0611   GFRAA 38 (L) 12/01/2016 0611    Recent Labs  11/29/16 0225 11/30/16 11/30/16 0354 12/01/16 12/01/16 0611  NA 136 133* 133* 136* 136  K 4.7 3.9 3.9  --  4.2  CL 97*  --  100*  --  102  CO2 26  --  23  --  27  GLUCOSE 214*  --  86  --  96  BUN 30* 38* 38* 28* 28*  CREATININE 1.67* 1.9* 1.89* 1.3* 1.32*  CALCIUM 8.8*  --  8.4*  --  8.6*    Recent Labs  03/05/16 05/07/16 11/27/16 1200  AST 18 21 22   ALT 9 10 14   ALKPHOS 76 78 78  BILITOT  --   --  0.3  PROT  --   --  6.7  ALBUMIN  --   --  2.8*    Recent Labs  11/27/16 1123  11/29/16 0225 11/30/16 11/30/16 0354 12/01/16 12/01/16 0611  WBC 9.6  < > 10.0 9.9 9.9 9.1 9.1  NEUTROABS 6.2  --   --   --   --   --   --   HGB 11.1*  < > 9.5* 8.2* 8.2*  --  8.7*  HCT 35.0*  < > 31.1* 26* 26.2*  --  27.6*  MCV 97.0  < > 97.8  --  97.0  --  97.5  PLT 220  < > 264 226 226  --  268  < > = values in this interval not displayed.  Recent Labs  05/07/16  CHOL 192  LDLCALC 78  TRIG 383*   No results found for: [facility] Lab Results  Component Value Date   TSH  9.31 (A) 06/13/2016   Lab Results  Component Value Date   HGBA1C 5.4 05/07/2016   Lab Results  Component Value Date   CHOL 192 05/07/2016   HDL 37 05/07/2016   LDLCALC 78 05/07/2016   TRIG 383 (A) 05/07/2016   CHOLHDL 5.2 10/27/2013    Significant Diagnostic Results in last 30 days:  No results found.  Assessment  and Plan  GERD (gastroesophageal reflux disease) Chronic and stable;cont omeprazole 40 mg daily  OA (osteoarthritis) Chronic and stable; pt has both tyleno and tramadol which she can use for pain  CKD (chronic kidney disease) stage 4, GFR 15-29 ml/min (HCC) GFR 21, Cr 1.89 worse than prior ; will monitor at intervals    Thurston Hole D. [name], MD

## 2017-01-05 ENCOUNTER — Telehealth (INDEPENDENT_AMBULATORY_CARE_PROVIDER_SITE_OTHER): Payer: Self-pay | Admitting: Orthopaedic Surgery

## 2017-01-05 NOTE — Telephone Encounter (Signed)
Patients daughter called asking about her mothers appointment on Wednesday the 16th at 6311 with Dr. Ophelia CharterYates. She was wondering if it was absolutely necessary that they come or if it would be okay for her to cancel her appointment. CB # [phone]

## 2017-01-05 NOTE — Telephone Encounter (Signed)
Please advise 

## 2017-01-05 NOTE — Telephone Encounter (Signed)
I called , discussed. She can come see me in one month. Doing therapy. Has a heel and buttocks ulcer doc is debriding at this moment. FYI

## 2017-01-05 NOTE — Telephone Encounter (Signed)
Noted.  Can you please cancel appt for Wednesday and call and reschedule for one month? Dr. Ophelia CharterYates has spoken with patient's daughter.  Thanks.

## 2017-01-07 ENCOUNTER — Ambulatory Visit (INDEPENDENT_AMBULATORY_CARE_PROVIDER_SITE_OTHER): Payer: Medicare Other | Admitting: Orthopaedic Surgery

## 2017-01-17 ENCOUNTER — Encounter: Payer: Self-pay | Admitting: Internal Medicine

## 2017-01-18 DIAGNOSIS — F29 Unspecified psychosis not due to a substance or known physiological condition: Secondary | ICD-10-CM | POA: Insufficient documentation

## 2017-01-18 DIAGNOSIS — R296 Repeated falls: Secondary | ICD-10-CM | POA: Insufficient documentation

## 2017-01-18 DIAGNOSIS — R627 Adult failure to thrive: Secondary | ICD-10-CM | POA: Insufficient documentation

## 2017-01-19 ENCOUNTER — Encounter: Payer: Self-pay | Admitting: Internal Medicine

## 2017-01-19 NOTE — Assessment & Plan Note (Signed)
GFR 21, Cr 1.89 worse than prior ; will monitor at intervals

## 2017-01-19 NOTE — Assessment & Plan Note (Addendum)
Chronic and stable;cont omeprazole 40 mg daily

## 2017-01-19 NOTE — Assessment & Plan Note (Signed)
Chronic and stable; pt has both tyleno and tramadol which she can use for pain

## 2017-01-28 ENCOUNTER — Encounter: Payer: Self-pay | Admitting: Internal Medicine

## 2017-01-28 ENCOUNTER — Non-Acute Institutional Stay (SKILLED_NURSING_FACILITY): Payer: Medicare Other | Admitting: Internal Medicine

## 2017-01-28 DIAGNOSIS — I2510 Atherosclerotic heart disease of native coronary artery without angina pectoris: Secondary | ICD-10-CM

## 2017-01-28 DIAGNOSIS — I10 Essential (primary) hypertension: Secondary | ICD-10-CM | POA: Diagnosis not present

## 2017-01-28 DIAGNOSIS — F0150 Vascular dementia without behavioral disturbance: Secondary | ICD-10-CM

## 2017-01-28 NOTE — Progress Notes (Signed)
Location:  [facility] Room Number: [phone] Place of Service:  SNF [id number])  Margit Hanks, MD  Patient Care Team: Margit Hanks, MD as PCP - General (Internal Medicine)  Extended Emergency Contact Information Primary Emergency Contact: Hall Busing Address: [address]          [place], [place] [address] [place] Home Phone: [phone] Mobile Phone: [phone] Relation: Daughter Secondary Emergency Contact: Marlowe Aschoff Address: [phone] [facility] [address] [facility] [address]          [place] [address] [place] Home Phone: [phone] Work Phone: [phone] Relation: Daughter    Allergies: Codeine; Morphine and related; and Penicillins  Chief Complaint  Patient presents with  . Medical Management of Chronic Issues    Routine Visit    HPI: Patient is 83 y.o. female who Who is being seen for routine issues of dementia, hypertension, and coronary artery disease.  Past Medical History:  Diagnosis Date  . AKI (acute kidney injury) (HCC) 07/2015  . Anemia   . Anginal pain (HCC)   . Anxiety   . CAD (coronary artery disease)    a. 02/2001 PTCA & BMS to the OM1;  b. 08/2001 CBA to ISR in OM1, CBA of mid LAD;  c. 02/2002 Failed PCI of mLAD occlusion, LCX nl, patent OM1 stent, RCA dom, 40p, PDA/PL nl, EF 50% mild antlat HK.  . Cataract    both eyes  . COPD (chronic obstructive pulmonary disease) (HCC)   . Dementia   . Depression   . Dyspnea   . GERD (gastroesophageal reflux disease)   . Glaucoma   . Hip fracture (HCC)   . Hypertension   . Hypertriglyceridemia   . Hypothyroidism   . Myocardial infarction (HCC)   . Osteoarthritis   . Poor circulation   . Thyroid disease    hypothyroidism, hypothyroidism  . UTI (urinary tract infection) 08/24/2015    Past Surgical History:  Procedure Laterality Date  . ABDOMINAL HYSTERECTOMY  1972  . CARDIAC SURGERY  2001   stent placement  . EYE SURGERY  2007/2008   cataracts  . GALLBLADDER  SURGERY  2009  . HERNIA REPAIR  2009  . HIP ARTHROPLASTY  06/06/2012   Procedure: ARTHROPLASTY BIPOLAR HIP;  Surgeon: Eldred Manges, MD;  Location: [facility];  Service: Orthopedics;  Laterality: Right;  monopolar  . HIP ARTHROPLASTY Left 11/28/2016   Procedure: ARTHROPLASTY BIPOLAR HIP (HEMIARTHROPLASTY);  Surgeon: Eldred Manges, MD;  Location: [facility] OR;  Service: Orthopedics;  Laterality: Left;    Allergies as of 01/28/2017      Reactions   Codeine Nausea And Vomiting   Morphine And Related Nausea And Vomiting   Penicillins Hives      Medication List       Accurate as of 01/28/17 11:59 PM. Always use your most recent med list.          acetaminophen 325 MG tablet Commonly known as:  TYLENOL Take 2 tablets (650 mg total) by mouth every 6 (six) hours as needed for mild pain, fever or headache (or Fever >/= 101).   aspirin 81 MG chewable tablet Chew 81 mg by mouth daily.   bisacodyl 10 MG suppository Commonly known as:  DULCOLAX Place 10 mg rectally as needed for moderate constipation.   clopidogrel 75 MG tablet Commonly known as:  PLAVIX Take 75 mg by mouth daily.   docusate sodium 100 MG capsule Commonly known as:  COLACE Take 1 capsule (100  mg total) by mouth 2 (two) times daily.   dorzolamidel-timolol 22.3-6.8 MG/ML Soln ophthalmic solution Commonly known as:  COSOPT Place 1 drop into both eyes every 12 (twelve) hours.   feeding supplement (PRO-STAT SUGAR FREE 64) Liqd Take 30 mLs by mouth 2 (two) times daily.   HYDROcodone-acetaminophen 5-325 MG tablet Commonly known as:  NORCO/VICODIN Take 1 tablet by mouth every 6 (six) hours as needed for moderate pain.   latanoprost 0.005 % ophthalmic solution Commonly known as:  XALATAN Place 1 drop into both eyes at bedtime.   levothyroxine 50 MCG tablet Commonly known as:  SYNTHROID, LEVOTHROID Take 50 mcg by mouth daily before breakfast.   loperamide 2 MG tablet Commonly known as:  IMODIUM A-D Give 2 tablets by mouth (4 mg  total) after first loose stool. May give 1 tablet by mouth (2 mg) after each loose stool not to exceed 8 mg by mouth in 24 hours.   magnesium hydroxide 400 MG/5ML suspension Commonly known as:  MILK OF MAGNESIA Take 30 mLs by mouth daily as needed for mild constipation.   metoprolol tartrate 25 MG tablet Commonly known as:  LOPRESSOR Take 25 mg by mouth 2 (two) times daily.   multivitamin with minerals tablet Take 1 tablet by mouth daily.   NUTRITIONAL SUPPLEMENTS PO Give Medpass 4 oz by mouth 2 times daily due to weight loss.   omeprazole 40 MG capsule Commonly known as:  PRILOSEC Take 40 mg by mouth daily.   ondansetron 4 MG disintegrating tablet Commonly known as:  ZOFRAN ODT Take 1 tablet (4 mg total) by mouth every 8 (eight) hours as needed for nausea or vomiting.   QUEtiapine 25 MG tablet Commonly known as:  SEROQUEL Take 25 mg by mouth at bedtime.   RA SALINE ENEMA 19-7 GM/118ML Enem Place 1 each rectally as needed (for constipation).   zinc sulfate 220 (50 Zn) MG capsule Take 220 mg by mouth daily.       Meds ordered this encounter  Medications  . zinc sulfate 220 (50 Zn) MG capsule    Sig: Take 220 mg by mouth daily.  . QUEtiapine (SEROQUEL) 25 MG tablet    Sig: Take 25 mg by mouth at bedtime.  . Amino Acids-Protein Hydrolys (FEEDING SUPPLEMENT, PRO-STAT SUGAR FREE 64,) LIQD    Sig: Take 30 mLs by mouth 2 (two) times daily.  Marland Kitchen NUTRITIONAL SUPPLEMENTS PO    Sig: Give Medpass 4 oz by mouth 2 times daily due to weight loss.    Immunization History  Administered Date(s) Administered  . Influenza Whole 05/30/2013  . Influenza-Unspecified 06/23/2014, 05/21/2015, 05/24/2016  . PPD Test 10/26/2013  . Pneumococcal-Unspecified 08/23/2013  . Tdap 09/09/2016    Social History  Substance Use Topics  . Smoking status: Never Smoker  . Smokeless tobacco: Never Used  . Alcohol use No    Review of Systems  DATA OBTAINED: from Nursing-no new concerns, patient  is declining GENERAL:  no fevers, fatigue, appetite changes SKIN: No itching, rash HEENT: No complaint RESPIRATORY: No cough, wheezing, SOB CARDIAC: No chest pain, palpitations, lower extremity edema  GI: No abdominal pain, No N/V/D or constipation, No heartburn or reflux  GU: No dysuria, frequency or urgency, or incontinence  MUSCULOSKELETAL: No unrelieved bone/joint pain NEUROLOGIC: No headache, dizziness  PSYCHIATRIC: No overt anxiety or sadness  Vitals:   01/28/17 0826  BP: 99/66  Pulse: 81  Resp: 18  Temp: 98.2 F (36.8 C)   Body mass index is 28.2  kg/m. Physical Exam  GENERAL APPEARANCE:  Sleeping , No acute distress  SKIN: No diaphoresis rash HEENT: Unremarkable RESPIRATORY: Breathing is even, unlabored. Lung sounds are clear   CARDIOVASCULAR: Heart RRR no murmurs, rubs or gallops. No peripheral edema  GASTROINTESTINAL: Abdomen is soft, non-tender, not distended w/ normal bowel sounds.  GENITOURINARY: Bladder non tender, not distended  MUSCULOSKELETAL: No abnormal joints or musculature NEUROLOGIC: Cranial nerves 2-12 grossly intact. Moves all extremities PSYCHIATRIC: Dementia, no behavioral issues  Patient Active Problem List   Diagnosis Date Noted  . Recurrent falls 01/18/2017  . FTT (failure to thrive) in adult 01/18/2017  . Psychosis 01/18/2017  . Femoral neck stress fracture, with routine healing, subsequent encounter 12/07/2016  . S/P hip hemiarthroplasty 12/07/2016  . Hypotension 12/07/2016  . Pressure injury of skin 11/28/2016  . Hip fracture (HCC) 11/27/2016  . Dementia   . Hypothyroidism 05/04/2016  . Klebsiella pneumoniae sepsis (HCC) 12/01/2015  . Hx pulmonary embolism 12/01/2015  . Palliative care encounter   . Pain in the chest   . Weakness   . Acute on chronic renal failure (HCC) 11/21/2015  . Acute respiratory failure with hypoxia (HCC) 11/21/2015  . CKD (chronic kidney disease) stage 4, GFR 15-29 ml/min (HCC) 11/18/2015  . E. coli UTI  (urinary tract infection) 08/28/2015  . GERD (gastroesophageal reflux disease) 08/28/2015  . Klebsiella cystitis 08/24/2015  . ARF (acute renal failure) (HCC) 08/24/2015  . Cough 09/24/2014  . Pain in joint, ankle and foot 04/15/2014  . Cellulitis 04/10/2014  . HLD (hyperlipidemia) 12/22/2013  . Renal insufficiency 11/06/2013  . Chest pain 10/26/2013  . Epigastric pain 10/26/2013  . OA (osteoarthritis) 10/03/2013  . Edema 03/03/2013  . Essential hypertension, benign 01/05/2013  . Acute blood loss as cause of postoperative anemia 01/05/2013  . Glaucoma 06/06/2012  . Closed left hip fracture (HCC) 06/05/2012  . CAD (coronary artery disease) 06/05/2012  . Hypertension     CMP     Component Value Date/Time   NA 136 12/01/2016 0611   NA 136 (A) 12/01/2016   K 4.2 12/01/2016 0611   CL 102 12/01/2016 0611   CO2 27 12/01/2016 0611   GLUCOSE 96 12/01/2016 0611   BUN 28 (H) 12/01/2016 0611   BUN 28 (A) 12/01/2016   CREATININE 1.32 (H) 12/01/2016 0611   CALCIUM 8.6 (L) 12/01/2016 0611   PROT 6.7 11/27/2016 1200   ALBUMIN 2.8 (L) 11/27/2016 1200   AST 22 11/27/2016 1200   ALT 14 11/27/2016 1200   ALKPHOS 78 11/27/2016 1200   BILITOT 0.3 11/27/2016 1200   GFRNONAA 33 (L) 12/01/2016 0611   GFRAA 38 (L) 12/01/2016 0611    Recent Labs  11/29/16 0225 11/30/16 11/30/16 0354 12/01/16 12/01/16 0611  NA 136 133* 133* 136* 136  K 4.7 3.9 3.9  --  4.2  CL 97*  --  100*  --  102  CO2 26  --  23  --  27  GLUCOSE 214*  --  86  --  96  BUN 30* 38* 38* 28* 28*  CREATININE 1.67* 1.9* 1.89* 1.3* 1.32*  CALCIUM 8.8*  --  8.4*  --  8.6*    Recent Labs  05/07/16 11/27/16 1200  AST 21 22  ALT 10 14  ALKPHOS 78 78  BILITOT  --  0.3  PROT  --  6.7  ALBUMIN  --  2.8*    Recent Labs  11/27/16 1123  11/29/16 0225 11/30/16 11/30/16 0354 12/01/16 12/01/16 0611  WBC  9.6  < > 10.0 9.9 9.9 9.1 9.1  NEUTROABS 6.2  --   --   --   --   --   --   HGB 11.1*  < > 9.5* 8.2* 8.2*  --  8.7*    HCT 35.0*  < > 31.1* 26* 26.2*  --  27.6*  MCV 97.0  < > 97.8  --  97.0  --  97.5  PLT 220  < > 264 226 226  --  268  < > = values in this interval not displayed.  Recent Labs  05/07/16  CHOL 192  LDLCALC 78  TRIG 383*   [facility]: [facility] Lab Results  Component Value Date   TSH 9.31 (A) 06/13/2016   Lab Results  Component Value Date   HGBA1C 5.4 05/07/2016   Lab Results  Component Value Date   CHOL 192 05/07/2016   HDL 37 05/07/2016   LDLCALC 78 05/07/2016   TRIG 383 (A) 05/07/2016   CHOLHDL 5.2 10/27/2013    Significant Diagnostic Results in last 30 days:  No results found.  Assessment and Plan  Essential hypertension, benign Controlled;continue Lopressor 25 mg by mouth twice a day for now; patient is declining and probably most of her medications will be DC'd very soon  CAD (coronary artery disease) Chronic and stable, no complaints of chest pain; plan to continue ASA 81 mg by mouth daily, Lopressor 25 mg by mouth twice a day; patient is not on a statin; patient is palliative care  Dementia Chronic and progressive; will continue patient on Seroquel for agitation    Caillou Minus D. Lyn HollingsheadAlexander, MD

## 2017-01-29 ENCOUNTER — Encounter: Payer: Self-pay | Admitting: Internal Medicine

## 2017-01-29 ENCOUNTER — Non-Acute Institutional Stay (SKILLED_NURSING_FACILITY): Payer: Medicare Other | Admitting: Internal Medicine

## 2017-01-29 DIAGNOSIS — R627 Adult failure to thrive: Secondary | ICD-10-CM

## 2017-01-29 DIAGNOSIS — G301 Alzheimer's disease with late onset: Secondary | ICD-10-CM

## 2017-01-29 DIAGNOSIS — F0280 Dementia in other diseases classified elsewhere without behavioral disturbance: Secondary | ICD-10-CM

## 2017-01-29 NOTE — Progress Notes (Signed)
Location:  [facility] Room Number: [phone] Place of Service:  SNF [id number])  Margit Hanks, MD  Patient Care Team: Margit Hanks, MD as PCP - General (Internal Medicine)  Extended Emergency Contact Information Primary Emergency Contact: Hall Busing Address: [address]          [place], [place] [address] [place] Home Phone: [phone] Mobile Phone: [phone] Relation: Daughter Secondary Emergency Contact: Marlowe Aschoff Address: [phone] [facility] [address] [facility] [address]          [place] [address] [place] Home Phone: [phone] Work Phone: [phone] Relation: Daughter    Allergies: Codeine; Morphine and related; and Penicillins  Chief Complaint  Patient presents with  . Acute Visit    Acute     HPI: Patient is 82 y.o. female who Who is being seen today because patient has now transitioned from palliative care to hospice. Patient has been steadily declining. The wound care nurse asked me for some morphine to be given to her in the morning before her wound care. She has already morphine written for her.  Past Medical History:  Diagnosis Date  . AKI (acute kidney injury) (HCC) 07/2015  . Anemia   . Anginal pain (HCC)   . Anxiety   . CAD (coronary artery disease)    a. 02/2001 PTCA & BMS to the OM1;  b. 08/2001 CBA to ISR in OM1, CBA of mid LAD;  c. 02/2002 Failed PCI of mLAD occlusion, LCX nl, patent OM1 stent, RCA dom, 40p, PDA/PL nl, EF 50% mild antlat HK.  . Cataract    both eyes  . COPD (chronic obstructive pulmonary disease) (HCC)   . Dementia   . Depression   . Dyspnea   . GERD (gastroesophageal reflux disease)   . Glaucoma   . Hip fracture (HCC)   . Hypertension   . Hypertriglyceridemia   . Hypothyroidism   . Myocardial infarction (HCC)   . Osteoarthritis   . Poor circulation   . Thyroid disease    hypothyroidism, hypothyroidism  . UTI (urinary tract infection) 08/24/2015    Past Surgical History:    Procedure Laterality Date  . ABDOMINAL HYSTERECTOMY  1972  . CARDIAC SURGERY  2001   stent placement  . EYE SURGERY  2007/2008   cataracts  . GALLBLADDER SURGERY  2009  . HERNIA REPAIR  2009  . HIP ARTHROPLASTY  06/06/2012   Procedure: ARTHROPLASTY BIPOLAR HIP;  Surgeon: Eldred Manges, MD;  Location: [facility];  Service: Orthopedics;  Laterality: Right;  monopolar  . HIP ARTHROPLASTY Left 11/28/2016   Procedure: ARTHROPLASTY BIPOLAR HIP (HEMIARTHROPLASTY);  Surgeon: Eldred Manges, MD;  Location: [facility] OR;  Service: Orthopedics;  Laterality: Left;    Allergies as of 01/29/2017      Reactions   Codeine Nausea And Vomiting   Morphine And Related Nausea And Vomiting   Penicillins Hives      Medication List       Accurate as of 01/29/17  2:43 PM. Always use your most recent med list.          acetaminophen 325 MG tablet Commonly known as:  TYLENOL Take 2 tablets (650 mg total) by mouth every 6 (six) hours as needed for mild pain, fever or headache (or Fever >/= 101).   aspirin 81 MG chewable tablet Chew 81 mg by mouth daily.   bisacodyl 10 MG suppository Commonly known as:  DULCOLAX Place 10 mg rectally as needed for moderate constipation.  clopidogrel 75 MG tablet Commonly known as:  PLAVIX Take 75 mg by mouth daily.   docusate sodium 100 MG capsule Commonly known as:  COLACE Take 1 capsule (100 mg total) by mouth 2 (two) times daily.   dorzolamidel-timolol 22.3-6.8 MG/ML Soln ophthalmic solution Commonly known as:  COSOPT Place 1 drop into both eyes every 12 (twelve) hours.   feeding supplement (PRO-STAT SUGAR FREE 64) Liqd Take 30 mLs by mouth 2 (two) times daily.   HYDROcodone-acetaminophen 5-325 MG tablet Commonly known as:  NORCO/VICODIN Take 1 tablet by mouth every 6 (six) hours as needed for moderate pain.   latanoprost 0.005 % ophthalmic solution Commonly known as:  XALATAN Place 1 drop into both eyes at bedtime.   levothyroxine 50 MCG tablet Commonly known  as:  SYNTHROID, LEVOTHROID Take 50 mcg by mouth daily before breakfast.   loperamide 2 MG tablet Commonly known as:  IMODIUM A-D Give 2 tablets by mouth (4 mg total) after first loose stool. May give 1 tablet by mouth (2 mg) after each loose stool not to exceed 8 mg by mouth in 24 hours.   magnesium hydroxide 400 MG/5ML suspension Commonly known as:  MILK OF MAGNESIA Take 30 mLs by mouth daily as needed for mild constipation.   metoprolol tartrate 25 MG tablet Commonly known as:  LOPRESSOR Take 25 mg by mouth 2 (two) times daily.   multivitamin with minerals tablet Take 1 tablet by mouth daily.   NUTRITIONAL SUPPLEMENTS PO Give Medpass 4 oz by mouth 2 times daily due to weight loss.   omeprazole 40 MG capsule Commonly known as:  PRILOSEC Take 40 mg by mouth daily.   ondansetron 4 MG disintegrating tablet Commonly known as:  ZOFRAN ODT Take 1 tablet (4 mg total) by mouth every 8 (eight) hours as needed for nausea or vomiting.   QUEtiapine 25 MG tablet Commonly known as:  SEROQUEL Take 25 mg by mouth at bedtime.   RA SALINE ENEMA 19-7 GM/118ML Enem Place 1 each rectally as needed (for constipation).   zinc sulfate 220 (50 Zn) MG capsule Take 220 mg by mouth daily.       No orders of the defined types were placed in this encounter.   Immunization History  Administered Date(s) Administered  . Influenza Whole 05/30/2013  . Influenza-Unspecified 06/23/2014, 05/21/2015, 05/24/2016  . PPD Test 10/26/2013  . Pneumococcal-Unspecified 08/23/2013  . Tdap 09/09/2016    Social History  Substance Use Topics  . Smoking status: Never Smoker  . Smokeless tobacco: Never Used  . Alcohol use No    Review of Systems  DATA OBTAINED: from nurse GENERAL:  no fevers, fatigue, appetite changes SKIN: No itching, rash HEENT: No complaint RESPIRATORY: No cough, wheezing, SOB CARDIAC: No chest pain, palpitations, lower extremity edema  GI: No abdominal pain, No N/V/D or  constipation, No heartburn or reflux  GU: No dysuria, frequency or urgency, or incontinence  MUSCULOSKELETAL: No unrelieved bone/joint pain NEUROLOGIC: No headache, dizziness  PSYCHIATRIC: No overt anxiety or sadness  Vitals:   01/29/17 1438  BP: 99/66  Pulse: 81  Resp: 18  Temp: 98.2 F (36.8 C)   Body mass index is 28.2 kg/m. Physical Exam  GENERAL APPEARANCE: awake No acute distress ; weak appearing SKIN: No diaphoresis rash HEENT: Unremarkable RESPIRATORY: Breathing is even, unlabored. Lung sounds are clear   CARDIOVASCULAR: Heart RRR no murmurs, rubs or gallops. No peripheral edema  GASTROINTESTINAL: Abdomen is soft, non-tender, not distended w/ normal bowel sounds.  GENITOURINARY: Bladder non tender, not distended  MUSCULOSKELETAL: No abnormal joints or musculature NEUROLOGIC: Cranial nerves 2-12 grossly intact. Moves all extremities PSYCHIATRIC: Dementia, confused, no behavioral issues  Patient Active Problem List   Diagnosis Date Noted  . Recurrent falls 01/18/2017  . FTT (failure to thrive) in adult 01/18/2017  . Psychosis 01/18/2017  . Femoral neck stress fracture, with routine healing, subsequent encounter 12/07/2016  . S/P hip hemiarthroplasty 12/07/2016  . Hypotension 12/07/2016  . Pressure injury of skin 11/28/2016  . Hip fracture (HCC) 11/27/2016  . Dementia   . Hypothyroidism 05/04/2016  . Klebsiella pneumoniae sepsis (HCC) 12/01/2015  . Hx pulmonary embolism 12/01/2015  . Palliative care encounter   . Pain in the chest   . Weakness   . Acute on chronic renal failure (HCC) 11/21/2015  . Acute respiratory failure with hypoxia (HCC) 11/21/2015  . CKD (chronic kidney disease) stage 4, GFR 15-29 ml/min (HCC) 11/18/2015  . E. coli UTI (urinary tract infection) 08/28/2015  . GERD (gastroesophageal reflux disease) 08/28/2015  . Klebsiella cystitis 08/24/2015  . ARF (acute renal failure) (HCC) 08/24/2015  . Cough 09/24/2014  . Pain in joint, ankle and  foot 04/15/2014  . Cellulitis 04/10/2014  . HLD (hyperlipidemia) 12/22/2013  . Renal insufficiency 11/06/2013  . Chest pain 10/26/2013  . Epigastric pain 10/26/2013  . OA (osteoarthritis) 10/03/2013  . Edema 03/03/2013  . Essential hypertension, benign 01/05/2013  . Acute blood loss as cause of postoperative anemia 01/05/2013  . Glaucoma 06/06/2012  . Closed left hip fracture (HCC) 06/05/2012  . CAD (coronary artery disease) 06/05/2012  . Hypertension     CMP     Component Value Date/Time   NA 136 12/01/2016 0611   NA 136 (A) 12/01/2016   K 4.2 12/01/2016 0611   CL 102 12/01/2016 0611   CO2 27 12/01/2016 0611   GLUCOSE 96 12/01/2016 0611   BUN 28 (H) 12/01/2016 0611   BUN 28 (A) 12/01/2016   CREATININE 1.32 (H) 12/01/2016 0611   CALCIUM 8.6 (L) 12/01/2016 0611   PROT 6.7 11/27/2016 1200   ALBUMIN 2.8 (L) 11/27/2016 1200   AST 22 11/27/2016 1200   ALT 14 11/27/2016 1200   ALKPHOS 78 11/27/2016 1200   BILITOT 0.3 11/27/2016 1200   GFRNONAA 33 (L) 12/01/2016 0611   GFRAA 38 (L) 12/01/2016 0611    Recent Labs  11/29/16 0225 11/30/16 11/30/16 0354 12/01/16 12/01/16 0611  NA 136 133* 133* 136* 136  K 4.7 3.9 3.9  --  4.2  CL 97*  --  100*  --  102  CO2 26  --  23  --  27  GLUCOSE 214*  --  86  --  96  BUN 30* 38* 38* 28* 28*  CREATININE 1.67* 1.9* 1.89* 1.3* 1.32*  CALCIUM 8.8*  --  8.4*  --  8.6*    Recent Labs  03/05/16 05/07/16 11/27/16 1200  AST 18 21 22   ALT 9 10 14   ALKPHOS 76 78 78  BILITOT  --   --  0.3  PROT  --   --  6.7  ALBUMIN  --   --  2.8*    Recent Labs  11/27/16 1123  11/29/16 0225 11/30/16 11/30/16 0354 12/01/16 12/01/16 0611  WBC 9.6  < > 10.0 9.9 9.9 9.1 9.1  NEUTROABS 6.2  --   --   --   --   --   --   HGB 11.1*  < > 9.5* 8.2* 8.2*  --  8.7*  HCT 35.0*  < > 31.1* 26* 26.2*  --  27.6*  MCV 97.0  < > 97.8  --  97.0  --  97.5  PLT 220  < > 264 226 226  --  268  < > = values in this interval not displayed.  Recent Labs   05/07/16  CHOL 192  LDLCALC 78  TRIG 383*   No results found for: [facility] Lab Results  Component Value Date   TSH 9.31 (A) 06/13/2016   Lab Results  Component Value Date   HGBA1C 5.4 05/07/2016   Lab Results  Component Value Date   CHOL 192 05/07/2016   HDL 37 05/07/2016   LDLCALC 78 05/07/2016   TRIG 383 (A) 05/07/2016   CHOLHDL 5.2 10/27/2013    Significant Diagnostic Results in last 30 days:  No results found.  Assessment and Plan  END-stage DEMENTIA/FTT -patient is being transitioned to hospice today secondary to continued decline; patient has liquid morphine written for pain or respiratory distress; patient's daughter and I spoke at length     Time spent greater than 25 minutes Thurston Hole D. [name], MD

## 2017-02-03 ENCOUNTER — Ambulatory Visit (INDEPENDENT_AMBULATORY_CARE_PROVIDER_SITE_OTHER): Payer: Medicare Other | Admitting: Orthopaedic Surgery

## 2017-02-22 DEATH — deceased

## 2017-03-08 ENCOUNTER — Encounter: Payer: Self-pay | Admitting: Internal Medicine

## 2017-03-08 NOTE — Assessment & Plan Note (Signed)
Controlled;continue Lopressor 25 mg by mouth twice a day for now; patient is declining and probably most of her medications will be DC'd very soon

## 2017-03-08 NOTE — Assessment & Plan Note (Signed)
Chronic and stable, no complaints of chest pain; plan to continue ASA 81 mg by mouth daily, Lopressor 25 mg by mouth twice a day; patient is not on a statin; patient is palliative care

## 2017-03-08 NOTE — Assessment & Plan Note (Signed)
Chronic and progressive; will continue patient on Seroquel for agitation

## 2018-03-25 IMAGING — CR DG CHEST 2V
2 series · 2 of 2 positions shown · non-contrast
Comparison: 08/24/2015 and 04/10/2014)

CLINICAL DATA: Chest pain relieved by nitroglycerin and aspirin.

EXAM:
CHEST  2 VIEW

[chest lat]
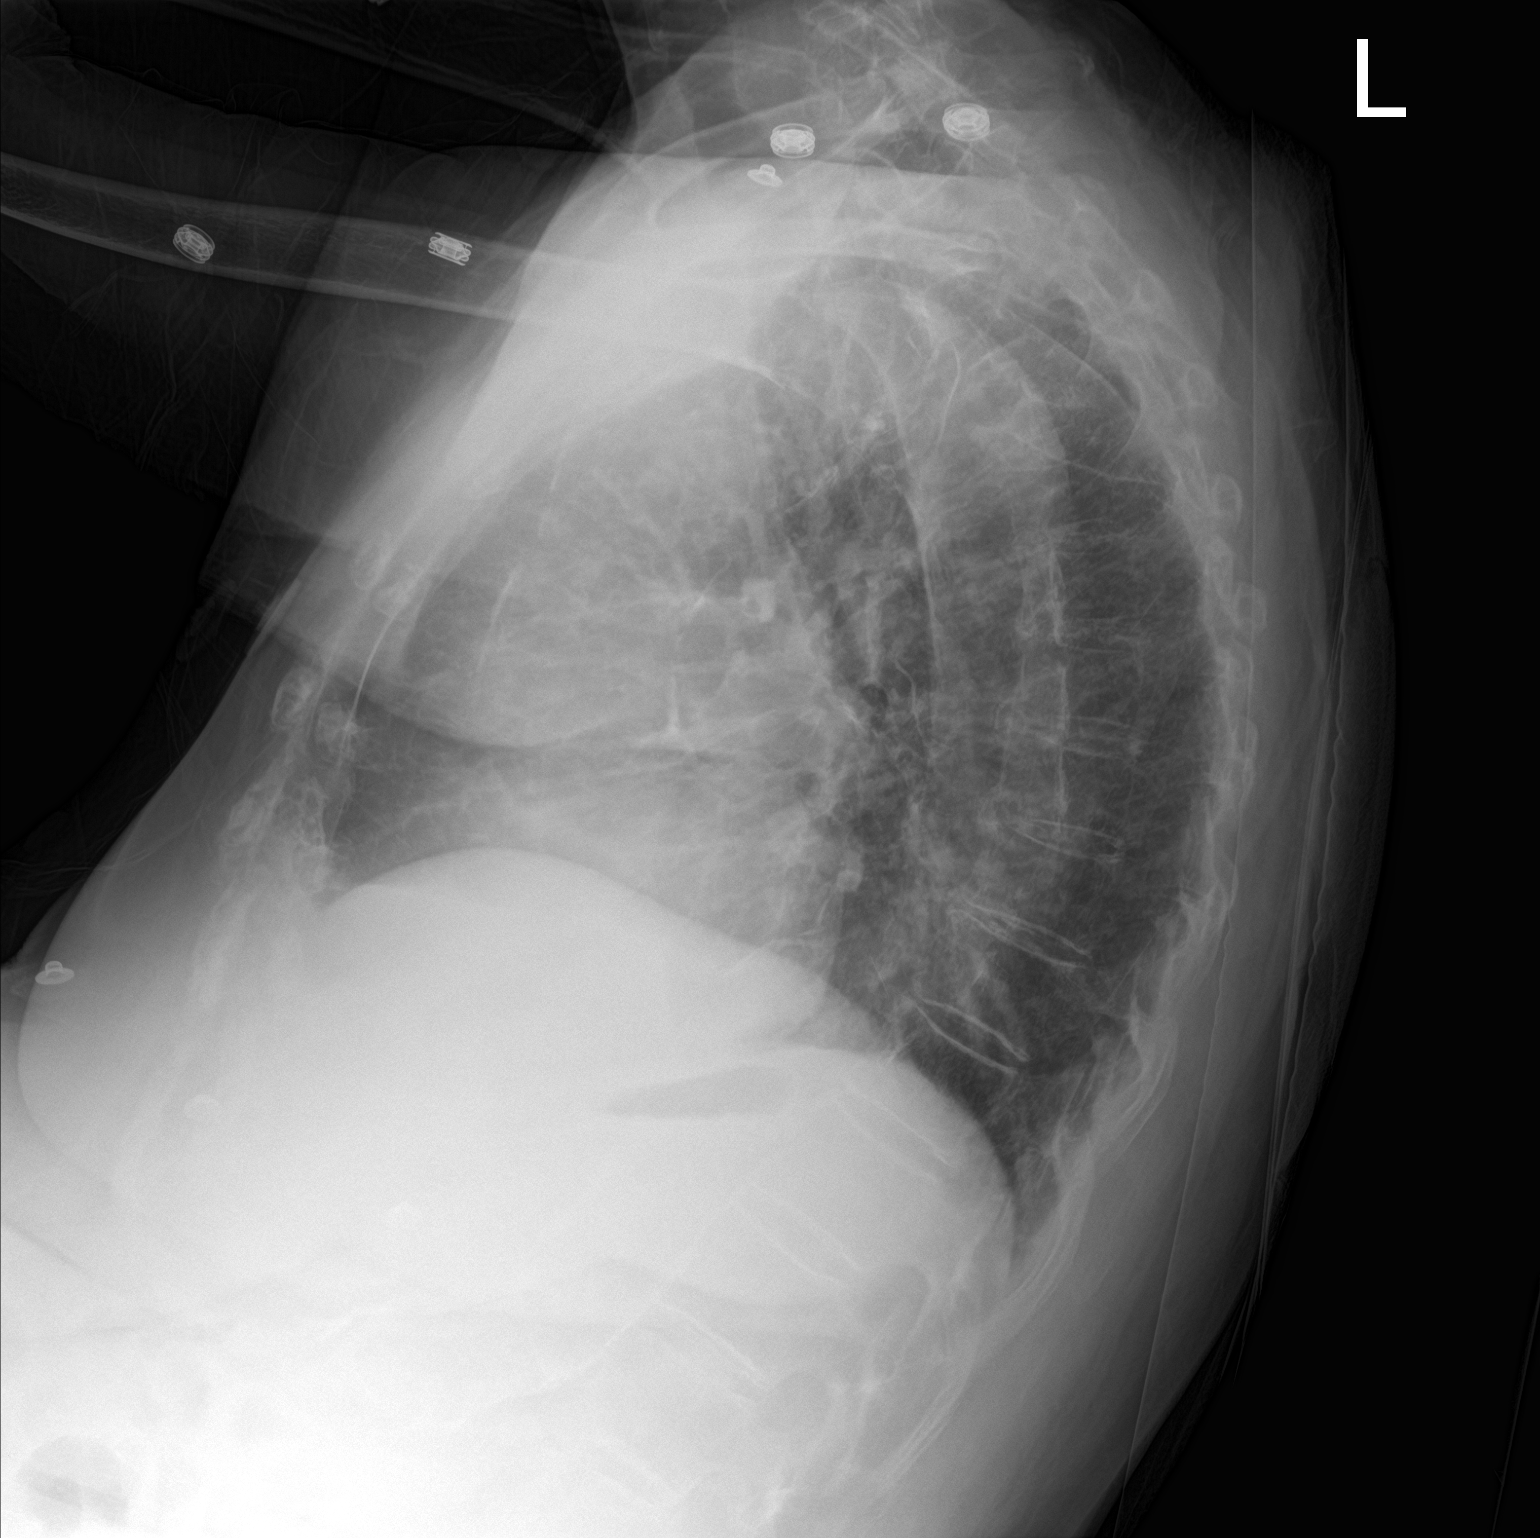

[chest ap]
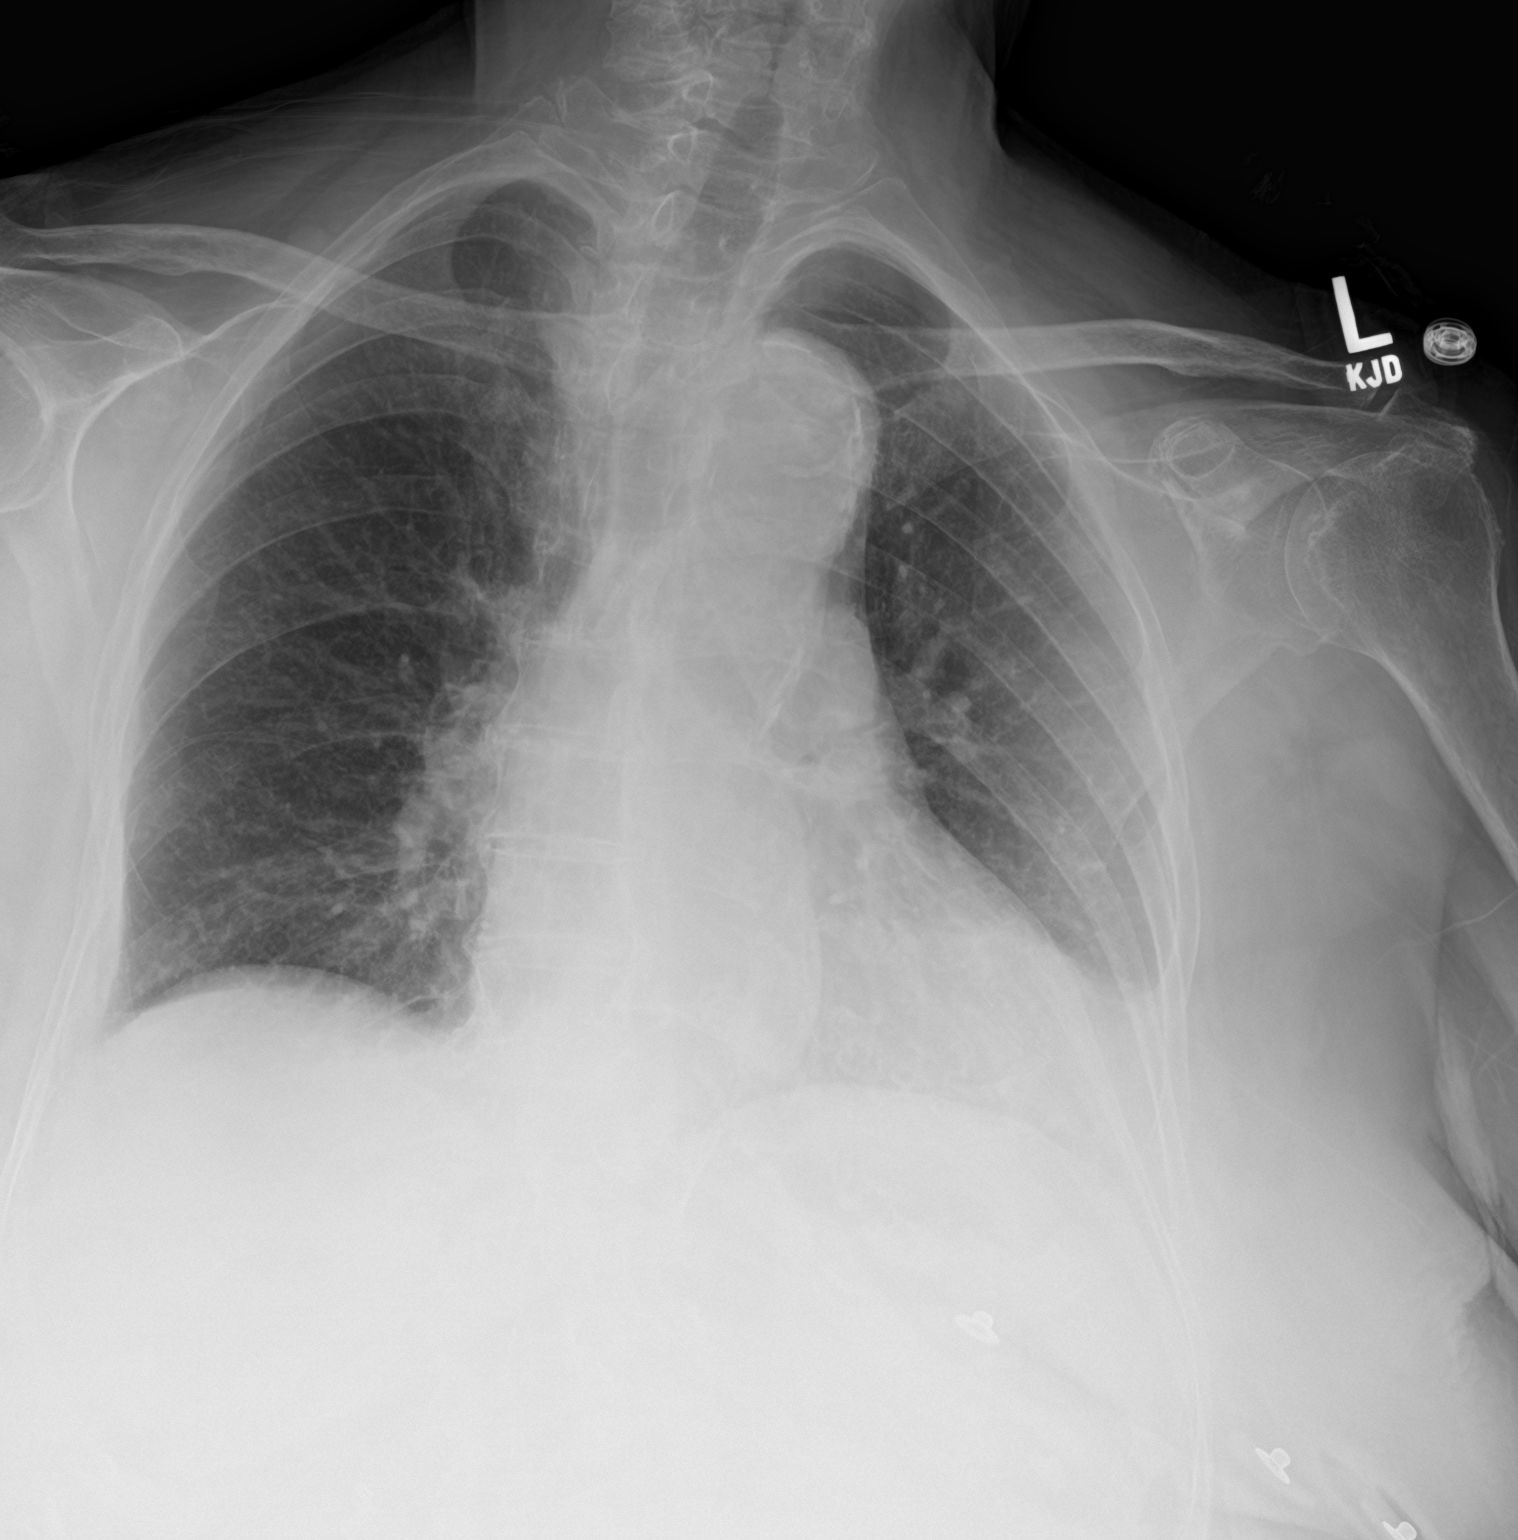

[2 of 2 positions shown; findings below may reference images not displayed]

FINDINGS: Stable cardiomegaly, aortic atherosclerosis and ectasia. The lungs
appear stable. There is no edema, confluent airspace opacity,
pleural effusion or pneumothorax. No acute osseous findings are
seen.
IMPRESSION: Stable chest with stable cardiomegaly and aortic atherosclerosis. No
acute findings.
